# Patient Record
Sex: Male | Born: 1960 | Smoking: Never smoker
Health system: Southern US, Community
[De-identification: ages and names within clinical notes are randomized; demographics above are authoritative.]

## PROBLEM LIST (undated history)

## (undated) DIAGNOSIS — I4891 Unspecified atrial fibrillation: Secondary | ICD-10-CM

## (undated) DIAGNOSIS — E119 Type 2 diabetes mellitus without complications: Secondary | ICD-10-CM

## (undated) DIAGNOSIS — I152 Hypertension secondary to endocrine disorders: Secondary | ICD-10-CM

## (undated) DIAGNOSIS — M109 Gout, unspecified: Secondary | ICD-10-CM

## (undated) DIAGNOSIS — G4733 Obstructive sleep apnea (adult) (pediatric): Secondary | ICD-10-CM

## (undated) DIAGNOSIS — I509 Heart failure, unspecified: Secondary | ICD-10-CM

## (undated) DIAGNOSIS — E1159 Type 2 diabetes mellitus with other circulatory complications: Secondary | ICD-10-CM

## (undated) HISTORY — PX: MITRAL VALVE REPAIR: SHX2039

---

## 2004-02-09 ENCOUNTER — Other Ambulatory Visit: Payer: Self-pay

## 2004-02-09 ENCOUNTER — Inpatient Hospital Stay: Payer: Self-pay

## 2004-03-02 ENCOUNTER — Ambulatory Visit: Payer: Self-pay

## 2004-09-13 ENCOUNTER — Ambulatory Visit: Payer: Self-pay | Admitting: Internal Medicine

## 2005-06-23 ENCOUNTER — Ambulatory Visit: Payer: Self-pay | Admitting: Internal Medicine

## 2005-06-30 ENCOUNTER — Ambulatory Visit: Payer: Self-pay | Admitting: Internal Medicine

## 2005-08-23 ENCOUNTER — Other Ambulatory Visit: Payer: Self-pay

## 2005-08-23 ENCOUNTER — Inpatient Hospital Stay: Payer: Self-pay | Admitting: Internal Medicine

## 2005-08-24 ENCOUNTER — Other Ambulatory Visit: Payer: Self-pay

## 2010-04-01 ENCOUNTER — Emergency Department: Payer: Self-pay | Admitting: Internal Medicine

## 2010-06-08 ENCOUNTER — Inpatient Hospital Stay: Payer: Self-pay | Admitting: Internal Medicine

## 2010-06-22 ENCOUNTER — Ambulatory Visit: Payer: Self-pay | Admitting: Cardiovascular Disease

## 2010-11-04 ENCOUNTER — Inpatient Hospital Stay: Payer: Self-pay | Admitting: Internal Medicine

## 2018-11-16 ENCOUNTER — Other Ambulatory Visit: Payer: Self-pay

## 2018-11-16 ENCOUNTER — Inpatient Hospital Stay: Payer: Medicare Other

## 2018-11-16 ENCOUNTER — Inpatient Hospital Stay
Admission: EM | Admit: 2018-11-16 | Discharge: 2018-12-20 | DRG: 004 | Disposition: E | Payer: Medicare Other | Attending: Pulmonary Disease | Admitting: Pulmonary Disease

## 2018-11-16 ENCOUNTER — Encounter: Payer: Self-pay | Admitting: Emergency Medicine

## 2018-11-16 ENCOUNTER — Emergency Department: Payer: Medicare Other

## 2018-11-16 DIAGNOSIS — E668 Other obesity: Secondary | ICD-10-CM | POA: Diagnosis not present

## 2018-11-16 DIAGNOSIS — Z01818 Encounter for other preprocedural examination: Secondary | ICD-10-CM

## 2018-11-16 DIAGNOSIS — M109 Gout, unspecified: Secondary | ICD-10-CM | POA: Diagnosis present

## 2018-11-16 DIAGNOSIS — J80 Acute respiratory distress syndrome: Secondary | ICD-10-CM | POA: Diagnosis not present

## 2018-11-16 DIAGNOSIS — E1122 Type 2 diabetes mellitus with diabetic chronic kidney disease: Secondary | ICD-10-CM | POA: Diagnosis present

## 2018-11-16 DIAGNOSIS — N179 Acute kidney failure, unspecified: Secondary | ICD-10-CM | POA: Diagnosis present

## 2018-11-16 DIAGNOSIS — E43 Unspecified severe protein-calorie malnutrition: Secondary | ICD-10-CM | POA: Diagnosis not present

## 2018-11-16 DIAGNOSIS — N183 Chronic kidney disease, stage 3 (moderate): Secondary | ICD-10-CM | POA: Diagnosis present

## 2018-11-16 DIAGNOSIS — Z79899 Other long term (current) drug therapy: Secondary | ICD-10-CM

## 2018-11-16 DIAGNOSIS — J9601 Acute respiratory failure with hypoxia: Secondary | ICD-10-CM | POA: Diagnosis not present

## 2018-11-16 DIAGNOSIS — I130 Hypertensive heart and chronic kidney disease with heart failure and stage 1 through stage 4 chronic kidney disease, or unspecified chronic kidney disease: Secondary | ICD-10-CM | POA: Diagnosis present

## 2018-11-16 DIAGNOSIS — G92 Toxic encephalopathy: Secondary | ICD-10-CM | POA: Diagnosis not present

## 2018-11-16 DIAGNOSIS — I4819 Other persistent atrial fibrillation: Secondary | ICD-10-CM | POA: Diagnosis present

## 2018-11-16 DIAGNOSIS — I509 Heart failure, unspecified: Secondary | ICD-10-CM | POA: Diagnosis not present

## 2018-11-16 DIAGNOSIS — R0602 Shortness of breath: Secondary | ICD-10-CM | POA: Diagnosis present

## 2018-11-16 DIAGNOSIS — E662 Morbid (severe) obesity with alveolar hypoventilation: Secondary | ICD-10-CM | POA: Diagnosis present

## 2018-11-16 DIAGNOSIS — J440 Chronic obstructive pulmonary disease with acute lower respiratory infection: Secondary | ICD-10-CM | POA: Diagnosis present

## 2018-11-16 DIAGNOSIS — R0902 Hypoxemia: Secondary | ICD-10-CM | POA: Diagnosis not present

## 2018-11-16 DIAGNOSIS — Z7189 Other specified counseling: Secondary | ICD-10-CM | POA: Diagnosis not present

## 2018-11-16 DIAGNOSIS — R6521 Severe sepsis with septic shock: Secondary | ICD-10-CM | POA: Diagnosis not present

## 2018-11-16 DIAGNOSIS — J9602 Acute respiratory failure with hypercapnia: Secondary | ICD-10-CM

## 2018-11-16 DIAGNOSIS — H5500 Unspecified nystagmus: Secondary | ICD-10-CM | POA: Diagnosis not present

## 2018-11-16 DIAGNOSIS — E46 Unspecified protein-calorie malnutrition: Secondary | ICD-10-CM | POA: Diagnosis not present

## 2018-11-16 DIAGNOSIS — K8020 Calculus of gallbladder without cholecystitis without obstruction: Secondary | ICD-10-CM | POA: Diagnosis present

## 2018-11-16 DIAGNOSIS — J810 Acute pulmonary edema: Secondary | ICD-10-CM | POA: Diagnosis not present

## 2018-11-16 DIAGNOSIS — E870 Hyperosmolality and hypernatremia: Secondary | ICD-10-CM | POA: Diagnosis not present

## 2018-11-16 DIAGNOSIS — Z9911 Dependence on respirator [ventilator] status: Secondary | ICD-10-CM

## 2018-11-16 DIAGNOSIS — Z515 Encounter for palliative care: Secondary | ICD-10-CM | POA: Diagnosis not present

## 2018-11-16 DIAGNOSIS — T17998A Other foreign object in respiratory tract, part unspecified causing other injury, initial encounter: Secondary | ICD-10-CM | POA: Diagnosis not present

## 2018-11-16 DIAGNOSIS — I5043 Acute on chronic combined systolic (congestive) and diastolic (congestive) heart failure: Secondary | ICD-10-CM | POA: Diagnosis present

## 2018-11-16 DIAGNOSIS — N170 Acute kidney failure with tubular necrosis: Secondary | ICD-10-CM | POA: Diagnosis not present

## 2018-11-16 DIAGNOSIS — I5023 Acute on chronic systolic (congestive) heart failure: Secondary | ICD-10-CM | POA: Diagnosis not present

## 2018-11-16 DIAGNOSIS — Z952 Presence of prosthetic heart valve: Secondary | ICD-10-CM

## 2018-11-16 DIAGNOSIS — J9819 Other pulmonary collapse: Secondary | ICD-10-CM | POA: Diagnosis not present

## 2018-11-16 DIAGNOSIS — Z7901 Long term (current) use of anticoagulants: Secondary | ICD-10-CM

## 2018-11-16 DIAGNOSIS — E1165 Type 2 diabetes mellitus with hyperglycemia: Secondary | ICD-10-CM | POA: Diagnosis not present

## 2018-11-16 DIAGNOSIS — J1520 Pneumonia due to staphylococcus, unspecified: Secondary | ICD-10-CM | POA: Diagnosis present

## 2018-11-16 DIAGNOSIS — J441 Chronic obstructive pulmonary disease with (acute) exacerbation: Secondary | ICD-10-CM

## 2018-11-16 DIAGNOSIS — K760 Fatty (change of) liver, not elsewhere classified: Secondary | ICD-10-CM | POA: Diagnosis present

## 2018-11-16 DIAGNOSIS — J9620 Acute and chronic respiratory failure, unspecified whether with hypoxia or hypercapnia: Secondary | ICD-10-CM | POA: Diagnosis not present

## 2018-11-16 DIAGNOSIS — Z66 Do not resuscitate: Secondary | ICD-10-CM | POA: Diagnosis not present

## 2018-11-16 DIAGNOSIS — Z20828 Contact with and (suspected) exposure to other viral communicable diseases: Secondary | ICD-10-CM | POA: Diagnosis present

## 2018-11-16 DIAGNOSIS — J9600 Acute respiratory failure, unspecified whether with hypoxia or hypercapnia: Secondary | ICD-10-CM

## 2018-11-16 DIAGNOSIS — J9622 Acute and chronic respiratory failure with hypercapnia: Secondary | ICD-10-CM | POA: Diagnosis present

## 2018-11-16 DIAGNOSIS — H7090 Unspecified mastoiditis, unspecified ear: Secondary | ICD-10-CM | POA: Diagnosis not present

## 2018-11-16 DIAGNOSIS — M6284 Sarcopenia: Secondary | ICD-10-CM | POA: Diagnosis present

## 2018-11-16 DIAGNOSIS — E872 Acidosis: Secondary | ICD-10-CM | POA: Diagnosis not present

## 2018-11-16 DIAGNOSIS — Z6841 Body Mass Index (BMI) 40.0 and over, adult: Secondary | ICD-10-CM | POA: Diagnosis not present

## 2018-11-16 DIAGNOSIS — D631 Anemia in chronic kidney disease: Secondary | ICD-10-CM | POA: Diagnosis present

## 2018-11-16 DIAGNOSIS — E782 Mixed hyperlipidemia: Secondary | ICD-10-CM | POA: Diagnosis present

## 2018-11-16 DIAGNOSIS — Z978 Presence of other specified devices: Secondary | ICD-10-CM

## 2018-11-16 DIAGNOSIS — J9621 Acute and chronic respiratory failure with hypoxia: Secondary | ICD-10-CM | POA: Diagnosis present

## 2018-11-16 DIAGNOSIS — R509 Fever, unspecified: Secondary | ICD-10-CM

## 2018-11-16 DIAGNOSIS — Z9981 Dependence on supplemental oxygen: Secondary | ICD-10-CM

## 2018-11-16 DIAGNOSIS — Z7982 Long term (current) use of aspirin: Secondary | ICD-10-CM

## 2018-11-16 DIAGNOSIS — Z452 Encounter for adjustment and management of vascular access device: Secondary | ICD-10-CM

## 2018-11-16 DIAGNOSIS — R140 Abdominal distension (gaseous): Secondary | ICD-10-CM

## 2018-11-16 DIAGNOSIS — J9811 Atelectasis: Secondary | ICD-10-CM | POA: Diagnosis not present

## 2018-11-16 DIAGNOSIS — J9690 Respiratory failure, unspecified, unspecified whether with hypoxia or hypercapnia: Secondary | ICD-10-CM

## 2018-11-16 DIAGNOSIS — R579 Shock, unspecified: Secondary | ICD-10-CM | POA: Diagnosis not present

## 2018-11-16 DIAGNOSIS — Z4659 Encounter for fitting and adjustment of other gastrointestinal appliance and device: Secondary | ICD-10-CM

## 2018-11-16 DIAGNOSIS — R627 Adult failure to thrive: Secondary | ICD-10-CM | POA: Diagnosis not present

## 2018-11-16 DIAGNOSIS — Z0189 Encounter for other specified special examinations: Secondary | ICD-10-CM

## 2018-11-16 DIAGNOSIS — A419 Sepsis, unspecified organism: Secondary | ICD-10-CM

## 2018-11-16 DIAGNOSIS — D696 Thrombocytopenia, unspecified: Secondary | ICD-10-CM | POA: Diagnosis not present

## 2018-11-16 DIAGNOSIS — T17800A Unspecified foreign body in other parts of respiratory tract causing asphyxiation, initial encounter: Secondary | ICD-10-CM

## 2018-11-16 DIAGNOSIS — E876 Hypokalemia: Secondary | ICD-10-CM | POA: Diagnosis not present

## 2018-11-16 DIAGNOSIS — J9809 Other diseases of bronchus, not elsewhere classified: Secondary | ICD-10-CM | POA: Diagnosis not present

## 2018-11-16 DIAGNOSIS — Z7984 Long term (current) use of oral hypoglycemic drugs: Secondary | ICD-10-CM

## 2018-11-16 DIAGNOSIS — I482 Chronic atrial fibrillation, unspecified: Secondary | ICD-10-CM | POA: Diagnosis not present

## 2018-11-16 DIAGNOSIS — Z992 Dependence on renal dialysis: Secondary | ICD-10-CM

## 2018-11-16 DIAGNOSIS — I5033 Acute on chronic diastolic (congestive) heart failure: Secondary | ICD-10-CM | POA: Diagnosis not present

## 2018-11-16 DIAGNOSIS — J159 Unspecified bacterial pneumonia: Secondary | ICD-10-CM | POA: Diagnosis not present

## 2018-11-16 HISTORY — DX: Hypertension secondary to endocrine disorders: I15.2

## 2018-11-16 HISTORY — DX: Heart failure, unspecified: I50.9

## 2018-11-16 HISTORY — DX: Morbid (severe) obesity due to excess calories: E66.01

## 2018-11-16 HISTORY — DX: Type 2 diabetes mellitus with other circulatory complications: E11.59

## 2018-11-16 HISTORY — DX: Type 2 diabetes mellitus without complications: E11.9

## 2018-11-16 HISTORY — DX: Unspecified atrial fibrillation: I48.91

## 2018-11-16 HISTORY — DX: Gout, unspecified: M10.9

## 2018-11-16 HISTORY — DX: Obstructive sleep apnea (adult) (pediatric): G47.33

## 2018-11-16 LAB — CBC WITH DIFFERENTIAL/PLATELET
Abs Immature Granulocytes: 0.05 10*3/uL (ref 0.00–0.07)
Basophils Absolute: 0 10*3/uL (ref 0.0–0.1)
Basophils Relative: 0 %
Eosinophils Absolute: 0 10*3/uL (ref 0.0–0.5)
Eosinophils Relative: 0 %
HCT: 44.2 % (ref 39.0–52.0)
Hemoglobin: 13.6 g/dL (ref 13.0–17.0)
Immature Granulocytes: 1 %
Lymphocytes Relative: 11 %
Lymphs Abs: 0.8 10*3/uL (ref 0.7–4.0)
MCH: 30.2 pg (ref 26.0–34.0)
MCHC: 30.8 g/dL (ref 30.0–36.0)
MCV: 98 fL (ref 80.0–100.0)
Monocytes Absolute: 1 10*3/uL (ref 0.1–1.0)
Monocytes Relative: 13 %
Neutro Abs: 5.7 10*3/uL (ref 1.7–7.7)
Neutrophils Relative %: 75 %
Platelets: 192 10*3/uL (ref 150–400)
RBC: 4.51 MIL/uL (ref 4.22–5.81)
RDW: 13.2 % (ref 11.5–15.5)
WBC: 7.5 10*3/uL (ref 4.0–10.5)
nRBC: 0 % (ref 0.0–0.2)

## 2018-11-16 LAB — COMPREHENSIVE METABOLIC PANEL
ALT: 29 U/L (ref 0–44)
AST: 23 U/L (ref 15–41)
Albumin: 3.6 g/dL (ref 3.5–5.0)
Alkaline Phosphatase: 67 U/L (ref 38–126)
Anion gap: 9 (ref 5–15)
BUN: 34 mg/dL — ABNORMAL HIGH (ref 6–20)
CO2: 34 mmol/L — ABNORMAL HIGH (ref 22–32)
Calcium: 8.8 mg/dL — ABNORMAL LOW (ref 8.9–10.3)
Chloride: 92 mmol/L — ABNORMAL LOW (ref 98–111)
Creatinine, Ser: 1.31 mg/dL — ABNORMAL HIGH (ref 0.61–1.24)
GFR calc Af Amer: >60 mL/min (ref >60)
GFR calc non Af Amer: 59 mL/min — ABNORMAL LOW (ref >60)
Glucose, Bld: 201 mg/dL — ABNORMAL HIGH (ref 70–99)
Potassium: 4.8 mmol/L (ref 3.5–5.1)
Sodium: 135 mmol/L (ref 135–145)
Total Bilirubin: 0.5 mg/dL (ref 0.3–1.2)
Total Protein: 6.9 g/dL (ref 6.5–8.1)

## 2018-11-16 LAB — SARS CORONAVIRUS 2 BY RT PCR (HOSPITAL ORDER, PERFORMED IN ~~LOC~~ HOSPITAL LAB): SARS Coronavirus 2: NEGATIVE

## 2018-11-16 LAB — BRAIN NATRIURETIC PEPTIDE: B Natriuretic Peptide: 389 pg/mL — ABNORMAL HIGH (ref 0.0–100.0)

## 2018-11-16 LAB — BLOOD GAS, ARTERIAL
Acid-Base Excess: 10.5 mmol/L — ABNORMAL HIGH (ref 0.0–2.0)
Bicarbonate: 36.7 mmol/L — ABNORMAL HIGH (ref 20.0–28.0)
FIO2: 1
MECHVT: 500 mL
Mechanical Rate: 25
O2 Saturation: 99.8 %
PEEP: 8 cmH2O
Patient temperature: 37
RATE: 25 resp/min
pCO2 arterial: 54 mmHg — ABNORMAL HIGH (ref 32.0–48.0)
pH, Arterial: 7.44 (ref 7.350–7.450)
pO2, Arterial: 211 mmHg — ABNORMAL HIGH (ref 83.0–108.0)

## 2018-11-16 LAB — TYPE AND SCREEN
ABO/RH(D): O POS
Antibody Screen: NEGATIVE

## 2018-11-16 LAB — ETHANOL: Alcohol, Ethyl (B): <10 mg/dL (ref <10)

## 2018-11-16 LAB — GLUCOSE, CAPILLARY: Glucose-Capillary: 138 mg/dL — ABNORMAL HIGH (ref 70–99)

## 2018-11-16 LAB — PROTIME-INR
INR: 3 — ABNORMAL HIGH (ref 0.8–1.2)
Prothrombin Time: 30.9 seconds — ABNORMAL HIGH (ref 11.4–15.2)

## 2018-11-16 LAB — TROPONIN I (HIGH SENSITIVITY): Troponin I (High Sensitivity): 14 ng/L (ref <18)

## 2018-11-16 MED ORDER — METOPROLOL TARTRATE 50 MG PO TABS: 50.0000 mg | ORAL_TABLET | Freq: Two times a day (BID) | ORAL | Status: DC

## 2018-11-16 MED ORDER — FENTANYL 2500MCG IN NS 250ML (10MCG/ML) PREMIX INFUSION
0.0000 ug/h | INTRAVENOUS | Status: DC
  Administered 2018-11-16: 100 ug/h via INTRAVENOUS
  Administered 2018-11-17 – 2018-11-18 (×4): 400 ug/h via INTRAVENOUS
  Filled 2018-11-16 (×6): qty 250

## 2018-11-16 MED ORDER — WARFARIN SODIUM 4 MG PO TABS
4.0000 mg | ORAL_TABLET | ORAL | Status: DC
  Administered 2018-11-17: 4 mg via NASOGASTRIC
  Filled 2018-11-16 (×2): qty 1

## 2018-11-16 MED ORDER — PANTOPRAZOLE SODIUM 40 MG IV SOLR
40.0000 mg | Freq: Every day | INTRAVENOUS | Status: DC
  Administered 2018-11-17 – 2018-11-18 (×3): 40 mg via INTRAVENOUS
  Filled 2018-11-16 (×3): qty 40

## 2018-11-16 MED ORDER — PRAVASTATIN SODIUM 40 MG PO TABS
80.0000 mg | ORAL_TABLET | Freq: Every evening | ORAL | Status: DC
  Filled 2018-11-16: qty 2

## 2018-11-16 MED ORDER — MIDAZOLAM HCL 2 MG/2ML IJ SOLN
2.0000 mg | INTRAMUSCULAR | Status: DC | PRN
  Administered 2018-11-17: 2 mg via INTRAVENOUS
  Administered 2018-11-17: 4 mg via INTRAVENOUS
  Administered 2018-11-17: 2 mg via INTRAVENOUS
  Administered 2018-11-18 – 2018-11-19 (×3): 4 mg via INTRAVENOUS
  Administered 2018-11-20: 10:00:00 2 mg via INTRAVENOUS
  Administered 2018-11-20: 04:00:00 4 mg via INTRAVENOUS
  Administered 2018-11-20 – 2018-11-29 (×6): 2 mg via INTRAVENOUS
  Filled 2018-11-16 (×2): qty 4
  Filled 2018-11-16: qty 2
  Filled 2018-11-16: qty 4
  Filled 2018-11-16: qty 2
  Filled 2018-11-16: qty 4
  Filled 2018-11-16: qty 2
  Filled 2018-11-16: qty 4
  Filled 2018-11-16 (×2): qty 2
  Filled 2018-11-16: qty 4
  Filled 2018-11-16: qty 2
  Filled 2018-11-16 (×2): qty 4
  Filled 2018-11-16: qty 2

## 2018-11-16 MED ORDER — ORAL CARE MOUTH RINSE
15.0000 mL | OROMUCOSAL | Status: DC
  Administered 2018-11-17 – 2018-11-18 (×11): 15 mL via OROMUCOSAL

## 2018-11-16 MED ORDER — ETOMIDATE 2 MG/ML IV SOLN
INTRAVENOUS | Status: AC
  Filled 2018-11-16: qty 20

## 2018-11-16 MED ORDER — MIDAZOLAM HCL 2 MG/2ML IJ SOLN
4.0000 mg | Freq: Once | INTRAMUSCULAR | Status: DC
  Filled 2018-11-16: qty 4

## 2018-11-16 MED ORDER — DILTIAZEM HCL 30 MG PO TABS
60.0000 mg | ORAL_TABLET | Freq: Four times a day (QID) | ORAL | Status: DC
  Administered 2018-11-17 – 2018-11-18 (×4): 60 mg via NASOGASTRIC
  Filled 2018-11-16 (×7): qty 2

## 2018-11-16 MED ORDER — INSULIN ASPART 100 UNIT/ML ~~LOC~~ SOLN
0.0000 [IU] | SUBCUTANEOUS | Status: DC
  Administered 2018-11-17 (×4): 5 [IU] via SUBCUTANEOUS
  Administered 2018-11-17: 3 [IU] via SUBCUTANEOUS
  Administered 2018-11-17: 8 [IU] via SUBCUTANEOUS
  Administered 2018-11-18: 5 [IU] via SUBCUTANEOUS
  Administered 2018-11-18 (×2): 3 [IU] via SUBCUTANEOUS
  Administered 2018-11-18: 2 [IU] via SUBCUTANEOUS
  Administered 2018-11-18 (×2): 3 [IU] via SUBCUTANEOUS
  Administered 2018-11-19: 5 [IU] via SUBCUTANEOUS
  Administered 2018-11-19 (×3): 3 [IU] via SUBCUTANEOUS
  Administered 2018-11-19 (×2): 5 [IU] via SUBCUTANEOUS
  Administered 2018-11-19 – 2018-11-20 (×4): 3 [IU] via SUBCUTANEOUS
  Administered 2018-11-20: 5 [IU] via SUBCUTANEOUS
  Administered 2018-11-20: 3 [IU] via SUBCUTANEOUS
  Administered 2018-11-21: 05:00:00 5 [IU] via SUBCUTANEOUS
  Administered 2018-11-21: 3 [IU] via SUBCUTANEOUS
  Administered 2018-11-21: 17:00:00 5 [IU] via SUBCUTANEOUS
  Administered 2018-11-21: 3 [IU] via SUBCUTANEOUS
  Administered 2018-11-21 – 2018-11-22 (×3): 5 [IU] via SUBCUTANEOUS
  Administered 2018-11-22 (×2): 8 [IU] via SUBCUTANEOUS
  Administered 2018-11-22 (×2): 5 [IU] via SUBCUTANEOUS
  Administered 2018-11-22: 05:00:00 8 [IU] via SUBCUTANEOUS
  Administered 2018-11-23: 5 [IU] via SUBCUTANEOUS
  Administered 2018-11-23: 11 [IU] via SUBCUTANEOUS
  Administered 2018-11-23 (×3): 8 [IU] via SUBCUTANEOUS
  Administered 2018-11-23 – 2018-11-24 (×2): 5 [IU] via SUBCUTANEOUS
  Administered 2018-11-24: 11 [IU] via SUBCUTANEOUS
  Administered 2018-11-24 (×3): 8 [IU] via SUBCUTANEOUS
  Administered 2018-11-24: 5 [IU] via SUBCUTANEOUS
  Administered 2018-11-24: 8 [IU] via SUBCUTANEOUS
  Administered 2018-11-25 (×2): 11 [IU] via SUBCUTANEOUS
  Administered 2018-11-25: 15 [IU] via SUBCUTANEOUS
  Administered 2018-11-25: 11 [IU] via SUBCUTANEOUS
  Administered 2018-11-25: 15 [IU] via SUBCUTANEOUS
  Administered 2018-11-25: 08:00:00 11 [IU] via SUBCUTANEOUS
  Administered 2018-11-26 (×2): 15 [IU] via SUBCUTANEOUS
  Administered 2018-11-26: 11 [IU] via SUBCUTANEOUS
  Administered 2018-11-26 – 2018-11-27 (×4): 15 [IU] via SUBCUTANEOUS
  Filled 2018-11-16 (×61): qty 1

## 2018-11-16 MED ORDER — SODIUM CHLORIDE 0.9% FLUSH
3.0000 mL | INTRAVENOUS | Status: DC | PRN
  Administered 2018-11-17 (×2): 3 mL via INTRAVENOUS
  Filled 2018-11-16 (×2): qty 3

## 2018-11-16 MED ORDER — BISACODYL 10 MG RE SUPP: 10.0000 mg | Freq: Every day | RECTAL | Status: DC | PRN

## 2018-11-16 MED ORDER — WARFARIN SODIUM 2.5 MG PO TABS: 2.5000 mg | ORAL_TABLET | ORAL | Status: DC

## 2018-11-16 MED ORDER — FENTANYL CITRATE (PF) 100 MCG/2ML IJ SOLN: 50.0000 ug | Freq: Once | INTRAMUSCULAR | Status: DC

## 2018-11-16 MED ORDER — WARFARIN - PHYSICIAN DOSING INPATIENT: Freq: Every day | Status: DC

## 2018-11-16 MED ORDER — ETOMIDATE 2 MG/ML IV SOLN: 40.0000 mg | Freq: Once | INTRAVENOUS | Status: DC

## 2018-11-16 MED ORDER — ONDANSETRON HCL 4 MG/2ML IJ SOLN: 4.0000 mg | Freq: Four times a day (QID) | INTRAMUSCULAR | Status: DC | PRN

## 2018-11-16 MED ORDER — IPRATROPIUM-ALBUTEROL 0.5-2.5 (3) MG/3ML IN SOLN
3.0000 mL | RESPIRATORY_TRACT | Status: DC
  Administered 2018-11-16 – 2018-11-24 (×43): 3 mL via RESPIRATORY_TRACT
  Filled 2018-11-16 (×43): qty 3

## 2018-11-16 MED ORDER — COLCHICINE 0.6 MG PO TABS
1.2000 mg | ORAL_TABLET | Freq: Every day | ORAL | Status: DC | PRN
  Filled 2018-11-16: qty 2

## 2018-11-16 MED ORDER — FENTANYL BOLUS VIA INFUSION
50.0000 ug | INTRAVENOUS | Status: DC | PRN
  Filled 2018-11-16: qty 50

## 2018-11-16 MED ORDER — DEXMEDETOMIDINE HCL IN NACL 400 MCG/100ML IV SOLN
0.0000 ug/kg/h | INTRAVENOUS | Status: DC
  Administered 2018-11-16: 1 ug/kg/h via INTRAVENOUS

## 2018-11-16 MED ORDER — ACETAMINOPHEN 325 MG PO TABS
650.0000 mg | ORAL_TABLET | ORAL | Status: DC | PRN
  Administered 2018-11-23 – 2018-12-12 (×9): 650 mg via NASOGASTRIC
  Filled 2018-11-16 (×9): qty 2

## 2018-11-16 MED ORDER — WARFARIN SODIUM 4 MG PO TABS: 4.0000 mg | ORAL_TABLET | ORAL | Status: DC

## 2018-11-16 MED ORDER — FUROSEMIDE 10 MG/ML IJ SOLN
40.0000 mg | Freq: Once | INTRAMUSCULAR | Status: AC
  Administered 2018-11-16: 40 mg via INTRAVENOUS
  Filled 2018-11-16: qty 4

## 2018-11-16 MED ORDER — MIDAZOLAM HCL 2 MG/2ML IJ SOLN
2.0000 mg | INTRAMUSCULAR | Status: AC | PRN
  Administered 2018-11-17: 4 mg via INTRAVENOUS
  Administered 2018-11-17: 1 mg via INTRAVENOUS
  Administered 2018-11-17: 2 mg via INTRAVENOUS
  Filled 2018-11-16 (×2): qty 2
  Filled 2018-11-16 (×2): qty 4

## 2018-11-16 MED ORDER — PRAVASTATIN SODIUM 20 MG PO TABS
80.0000 mg | ORAL_TABLET | Freq: Every evening | ORAL | Status: DC
  Administered 2018-11-17 – 2018-11-29 (×13): 80 mg via NASOGASTRIC
  Filled 2018-11-16 (×2): qty 2
  Filled 2018-11-16 (×7): qty 4
  Filled 2018-11-16: qty 2
  Filled 2018-11-16 (×3): qty 4
  Filled 2018-11-16 (×2): qty 2
  Filled 2018-11-16 (×2): qty 4
  Filled 2018-11-16: qty 2

## 2018-11-16 MED ORDER — CHLORHEXIDINE GLUCONATE 0.12% ORAL RINSE (MEDLINE KIT)
15.0000 mL | Freq: Two times a day (BID) | OROMUCOSAL | Status: DC
  Administered 2018-11-17 – 2018-11-18 (×3): 15 mL via OROMUCOSAL

## 2018-11-16 MED ORDER — CHLORHEXIDINE GLUCONATE CLOTH 2 % EX PADS: 6.0000 | MEDICATED_PAD | Freq: Every day | CUTANEOUS | Status: DC

## 2018-11-16 MED ORDER — WARFARIN SODIUM 2.5 MG PO TABS
2.5000 mg | ORAL_TABLET | ORAL | Status: DC
  Filled 2018-11-16: qty 1

## 2018-11-16 MED ORDER — METHYLPREDNISOLONE SODIUM SUCC 125 MG IJ SOLR
60.0000 mg | Freq: Two times a day (BID) | INTRAMUSCULAR | Status: DC
  Administered 2018-11-17 – 2018-11-18 (×4): 60 mg via INTRAVENOUS
  Filled 2018-11-16 (×4): qty 2

## 2018-11-16 MED ORDER — DILTIAZEM HCL ER COATED BEADS 240 MG PO CP24
240.0000 mg | ORAL_CAPSULE | Freq: Every day | ORAL | Status: DC
  Filled 2018-11-16: qty 1

## 2018-11-16 MED ORDER — LISINOPRIL 5 MG PO TABS
5.0000 mg | ORAL_TABLET | Freq: Every day | ORAL | Status: DC
  Administered 2018-11-17 – 2018-11-22 (×4): 5 mg via NASOGASTRIC
  Filled 2018-11-16 (×5): qty 1

## 2018-11-16 MED ORDER — FENTANYL CITRATE (PF) 100 MCG/2ML IJ SOLN
100.0000 ug | Freq: Once | INTRAMUSCULAR | Status: AC
  Administered 2018-11-16: 100 ug via INTRAVENOUS
  Filled 2018-11-16: qty 2

## 2018-11-16 MED ORDER — SODIUM CHLORIDE 0.9% FLUSH
3.0000 mL | Freq: Two times a day (BID) | INTRAVENOUS | Status: DC
  Administered 2018-11-17: 3 mL via INTRAVENOUS

## 2018-11-16 MED ORDER — ASPIRIN 81 MG PO CHEW
81.0000 mg | CHEWABLE_TABLET | Freq: Every day | ORAL | Status: DC
  Administered 2018-11-17 – 2018-12-08 (×20): 81 mg via NASOGASTRIC
  Filled 2018-11-16 (×21): qty 1

## 2018-11-16 MED ORDER — ACETAMINOPHEN 325 MG PO TABS: 650.0000 mg | ORAL_TABLET | ORAL | Status: DC | PRN

## 2018-11-16 MED ORDER — SENNOSIDES 8.8 MG/5ML PO SYRP
5.0000 mL | ORAL_SOLUTION | Freq: Two times a day (BID) | ORAL | Status: DC | PRN
  Administered 2018-11-21: 5 mL
  Filled 2018-11-16 (×2): qty 5

## 2018-11-16 MED ORDER — SODIUM CHLORIDE 0.9 % IV SOLN: 250.0000 mL | INTRAVENOUS | Status: DC | PRN

## 2018-11-16 MED ORDER — ASPIRIN EC 81 MG PO TBEC: 81.0000 mg | DELAYED_RELEASE_TABLET | Freq: Every day | ORAL | Status: DC

## 2018-11-16 MED ORDER — ROCURONIUM BROMIDE 50 MG/5ML IV SOLN
100.0000 mg | Freq: Once | INTRAVENOUS | Status: AC
  Administered 2018-11-16: 100 mg via INTRAVENOUS
  Filled 2018-11-16: qty 2

## 2018-11-16 MED ORDER — CHLORHEXIDINE GLUCONATE CLOTH 2 % EX PADS
6.0000 | MEDICATED_PAD | Freq: Every day | CUTANEOUS | Status: DC
  Administered 2018-11-17 – 2018-11-22 (×6): 6 via TOPICAL

## 2018-11-16 MED ORDER — METOPROLOL TARTRATE 50 MG PO TABS
50.0000 mg | ORAL_TABLET | Freq: Two times a day (BID) | ORAL | Status: DC
  Administered 2018-11-17 – 2018-11-30 (×21): 50 mg via NASOGASTRIC
  Filled 2018-11-16 (×26): qty 1

## 2018-11-16 MED ORDER — FENTANYL CITRATE (PF) 100 MCG/2ML IJ SOLN
200.0000 ug | Freq: Once | INTRAMUSCULAR | Status: DC
  Filled 2018-11-16: qty 4

## 2018-11-16 MED ORDER — FUROSEMIDE 10 MG/ML IJ SOLN
40.0000 mg | Freq: Two times a day (BID) | INTRAMUSCULAR | Status: DC
  Administered 2018-11-17: 40 mg via INTRAVENOUS
  Filled 2018-11-16: qty 4

## 2018-11-16 MED ORDER — NOREPINEPHRINE BITARTRATE 1 MG/ML IV SOLN
0.0000 ug/min | INTRAVENOUS | Status: DC
  Administered 2018-11-17: 10 ug/min via INTRAVENOUS
  Filled 2018-11-16: qty 4

## 2018-11-16 MED ORDER — SODIUM CHLORIDE 0.9% FLUSH: 10.0000 mL | INTRAVENOUS | Status: DC | PRN

## 2018-11-16 MED ORDER — LISINOPRIL 5 MG PO TABS: 5.0000 mg | ORAL_TABLET | Freq: Every day | ORAL | Status: DC

## 2018-11-16 NOTE — H&P (Signed)
[facility] [place] NAME: Kyle White    MR#:  [id number]  DATE OF BIRTH:  1960-05-12  DATE OF ADMISSION:  11/09/2018  PRIMARY CARE PHYSICIAN: [facility] [place]   REQUESTING/REFERRING PHYSICIAN: Charlotte Crumb, MD  CHIEF COMPLAINT:   Chief Complaint  Patient presents with  . Shortness of Breath  . Weakness    HISTORY OF PRESENT ILLNESS:  Kyle White  is a 58 y.o. male with a known history of atrial fibrillation on Coumadin, CHF, diabetes mellitus, and obstructive sleep apnea with CPAP use at home, morbid obesity with sedentary lifestyle on oxygen at 2 L/min at home.  Patient was placed on BiPAP therapy shortly after arriving from home via EMS services after being called by the patient's longtime girlfriend who is currently at the bedside.  She tells me he has been more short of breath for about 2 weeks with increased weakness and fatigue.  He has been using home oxygen therapy as well as CPAP at night and during hours of sleep during the day.  He has had an occasional nonproductive cough.  He denies chest pain.  He denies fever, chills, nausea, vomiting, or diarrhea.  Patient nor his girlfriend have noticed any increased edema of his lower extremities.  Also no noted increased abdominal girth.  Echocardiogram in 2014 demonstrated 55 to 60% ejection fraction at [facility].  VBG on arrival shows pH of 7.2 with HCO3 of 50.  CO2 is a 95.  EKG was completed with no evidence of ischemia.  According to patient's documented history, he has a history of mitral valve repair.  BNP on arrival is 389.  Troponin is 14.  BUN is 34 with creatinine 1.31.  Chest x-ray demonstrated pulmonary vascular congestion.  He has been admitted to the hospitalist service with transfer of care to the intensivist and ICU/stepdown.  PAST MEDICAL HISTORY:   Past Medical History:  Diagnosis Date  . Atrial fibrillation ([place])   . CHF (congestive heart failure) ([place])    . Diabetes mellitus without complication ([place])   . Gout   . Hypertension associated with type 2 diabetes mellitus ([place])   . Morbid obesity ([place])   . Obstructive sleep apnea     PAST SURGICAL HISTORY:   Past Surgical History:  Procedure Laterality Date  . MITRAL VALVE REPAIR      SOCIAL HISTORY:   Social History   Tobacco Use  . Smoking status: Never Smoker  . Smokeless tobacco: Never Used  Substance Use Topics  . Alcohol use: Never    Frequency: Never    FAMILY HISTORY:  History reviewed. No pertinent family history.  DRUG ALLERGIES:  No Known Allergies  REVIEW OF SYSTEMS:   ROS  Review of Systems  Constitutional: Positive for malaise/fatigue. Negative for chills and fever.  HENT: Negative for congestion and sore throat.   Eyes: Negative for blurred vision and double vision.  Respiratory: Positive for cough and shortness of breath. Negative for sputum production and wheezing.   Cardiovascular: Negative for chest pain, palpitations and leg swelling.  Gastrointestinal: Negative for abdominal pain, constipation, diarrhea, heartburn, nausea and vomiting.  Genitourinary: Negative for dysuria, flank pain and hematuria.  Musculoskeletal: Negative for falls and myalgias.  Skin: Negative for itching and rash.  Neurological: Positive for weakness. Negative for dizziness and headaches.  Psychiatric/Behavioral: Negative for depression.   MEDICATIONS AT HOME:   Prior to Admission medications   Medication Sig Start Date End Date  Taking? Authorizing Provider  aspirin EC 81 MG tablet Take 81 mg by mouth daily.   Yes [provider]  colchicine 0.6 MG tablet Take 12 mg by mouth. At first sing of gout flare   Yes [provider]  diltiazem (CARDIZEM CD) 240 MG 24 hr capsule Take 240 mg by mouth daily.   Yes [provider]  furosemide (LASIX) 40 MG tablet Take 40 mg by mouth 2 (two) times daily.   Yes [provider]  lisinopril (ZESTRIL)  5 MG tablet Take 5 mg by mouth daily.   Yes [provider]  metFORMIN (GLUCOPHAGE) 500 MG tablet Take 500 mg by mouth 2 (two) times daily with a meal.   Yes [provider]  metoprolol tartrate (LOPRESSOR) 100 MG tablet Take 50 mg by mouth 2 (two) times daily.   Yes [provider]  pravastatin (PRAVACHOL) 80 MG tablet Take 80 mg by mouth every evening.   Yes [provider]  warfarin (COUMADIN) 4 MG tablet Take 2.5-4 mg by mouth daily. Take 4 mg on Sunday, Tuesday, Wednesday, Thursday, and Saturday. Take 2.5 mg on Monday and Friday   Yes [provider]      VITAL SIGNS:  Blood pressure (!) 123/98, pulse 74, temperature 97.7 F (36.5 C), temperature source Oral, resp. rate (!) 25, height 5\' 9"  (1.753 m), weight (!) 158 kg, SpO2 97 %.  PHYSICAL EXAMINATION:  Physical Exam  GENERAL:  58 y.o.-year-old Ill-appearing patient lying in the bed with no acute distress.  EYES: Pupils equal, round, reactive to light and accommodation. No scleral icterus. Extraocular muscles intact.  HEENT: Head atraumatic, normocephalic. Oropharynx and nasopharynx clear.  NECK:  Supple, no jugular venous distention. No thyroid enlargement, no tenderness.  LUNGS:Bilateral rales. BIPAP in use CARDIOVASCULAR: Regular rate and rhythm, S1, S2 normal. No murmurs, rubs, or gallops.  ABDOMEN: morbid obesity.   Bowel sounds present. No organomegaly or mass.  EXTREMITIES: No pedal edema, cyanosis, or clubbing.  NEUROLOGIC: Cranial nerves II through XII are intact. Muscle strength 5/5 in all extremities. Sensation intact. Gait not checked.  PSYCHIATRIC: The patient is alert and oriented x 3.  SKIN: No obvious rash, lesion, or ulcer  LABORATORY PANEL:   CBC Recent Labs  Lab 10/23/2018 1655  WBC 7.5  HGB 13.6  HCT 44.2  PLT 192   ------------------------------------------------------------------------------------------------------------------  Chemistries  Recent Labs   Lab 11/02/2018 1655  NA 135  K 4.8  CL 92*  CO2 34*  GLUCOSE 201*  BUN 34*  CREATININE 1.31*  CALCIUM 8.8*  AST 23  ALT 29  ALKPHOS 67  BILITOT 0.5   ------------------------------------------------------------------------------------------------------------------  Cardiac Enzymes No results for input(s): TROPONINI in the last 168 hours. ------------------------------------------------------------------------------------------------------------------  RADIOLOGY:  Dg Abd 1 View  Result Date: 10/30/2018 CLINICAL DATA:  Abdominal distention EXAM: ABDOMEN - 1 VIEW COMPARISON:  None. FINDINGS: Limited study due to portable nature of the study and body habitus. I see no significant bowel dilatation or free air. Study otherwise limited. IMPRESSION: Very limited study. No visible changes of bowel obstruction or free air. Electronically Signed   By: Rolm Baptise M.D.   On: 10/22/2018 22:33   Dg Chest Port 1 View  Result Date: 11/11/2018 CLINICAL DATA:  Shortness of breath for 2 weeks. EXAM: PORTABLE CHEST 1 VIEW COMPARISON:  11/04/2010 radiograph FINDINGS: Cardiomegaly with pulmonary vascular congestion noted. Possible mild interstitial opacities/edema noted. No pneumothorax or acute bony abnormality. Bibasilar atelectasis noted. There may be trace pleural  effusions present. IMPRESSION: Cardiomegaly with pulmonary vascular congestion and possible mild interstitial edema. Bibasilar atelectasis.  Possible trace pleural effusions. Electronically Signed   By: Margarette Canada M.D.   On: 10/26/2018 17:13      IMPRESSION AND PLAN:   1.  Acute on chronic hypoxic respiratory failure - BiPAP therapy initiated on arrival to the emergency room - Patient will need PT VBG/ABG  2.  Obstructive sleep apnea - Patient will need to continue BiPAP therapy during hours of sleep  3.  Acute on chronic CHF, EF 55 to 60% on echo in 2014 at [facility] - Repeat echocardiogram - Lasix 40 mg IV twice daily - Telemetry  monitoring - EKG in the a.m. - Cardiology, Dr. Stanford Breed consulted for further evaluation and management recommendations -Continue to trend troponin levels  4.  History of atrial fibrillation - Continue Coumadin -Repeat INR in the a.m. -Telemetry monitoring  5.  Acute renal failure with BUN 34 and creatinine 1.31 - Repeat BMP in the a.m. and continue to monitor renal function closely  6.  Diabetes mellitus - Sliding scale insulin -Hemoglobin A1c  7.  Generalized weakness -Physical therapy consulted for supportive care  PPI prophylaxis    All the records are reviewed and case discussed with ED provider. The plan of care was discussed in details with the patient (and family). I answered all questions. The patient agreed to proceed with the above mentioned plan. Further management will depend upon hospital course.   CODE STATUS: Full code  TOTAL TIME TAKING CARE OF THIS PATIENT:45 minutes.    [place] on 10/26/2018 at 11:58 PM  Pager - [phone]  After 6pm go to [email or web address] - [employer] [facility]  Office  [phone]  CC: Primary care physician; [employer]   Note: This dictation was prepared with Diplomatic Services operational officer dictation along with smaller Company secretary. Any transcriptional errors that result from this process are unintentional.

## 2018-11-16 NOTE — Procedures (Signed)
Endotracheal Intubation: Patient required placement of an artificial airway secondary to Respiratory Failure  Consent: Emergent.   Hand washing performed prior to starting the procedure.   Medications administered for sedation prior to procedure:    Vecuronium 60 mg IV, Fentanyl 100 mcg IV.    A time out procedure was called and correct patient, name, & ID confirmed. Needed supplies and equipment were assembled and checked to include ETT, 10 ml syringe, Glidescope, Mac and Miller blades, suction, oxygen and bag mask valve, end tidal CO2 monitor.   Patient was positioned to align the mouth and pharynx to facilitate visualization of the glottis.   Heart rate, SpO2 and blood pressure was continuously monitored during the procedure. Pre-oxygenation was conducted prior to intubation and endotracheal tube was placed through the vocal cords into the trachea.     The artificial airway was placed under direct visualization via glidescope route using a 8.0 ETT on the first attempt.  ETT was secured at 24 cm mark.  Placement was confirmed by auscuitation of lungs with good breath sounds bilaterally and no stomach sounds.  Condensation was noted on endotracheal tube.   Pulse ox 98%.  CO2 detector in place with appropriate color change.   Complications: None .   OperatorLanney Gins MD  Chest radiograph ordered and pending.   Comments: OGT placed via glidescope.   Ottie Glazier, M.D.  [facility] [place]

## 2018-11-16 NOTE — ED Notes (Signed)
Patient resting quietly at this time.  No acute distress noted. 

## 2018-11-16 NOTE — ED Notes (Signed)
ED TO INPATIENT HANDOFF REPORT  ED Nurse Name and Phone #: Annie Main [phone]  S Name/Age/Gender Kyle White 58 y.o. male Room/Bed: ED18A/ED18A  Code Status   Code Status: Full Code  Home/SNF/Other Home Patient oriented to: self, place, time and situation Is this baseline? Yes   Triage Complete: Triage complete  Chief Complaint Weakness  Triage Note Pt to ED via EMS from home c/o increased weakness and some SOB x2 weeks.  Pt wears chronic 2L [place] but was found to be 78% by fire department so placed on NRB and up to 99%.  10L NRB with EMS maintaining 99%, ETCO2 75, CBG 187 and given 183mL NS bolus en route.  Presents A&Ox4, pt drowsy in and out sleeping.  EMS also reported afib RVR on their monitor with rate up to 175bpm.   Allergies No Known Allergies  Level of Care/Admitting Diagnosis ED Disposition    ED Disposition Condition [place]: [place] [[id number]]  Level of Care: Stepdown [14]  Covid Evaluation: Asymptomatic Screening Protocol (No Symptoms)  Diagnosis: Acute exacerbation of CHF (congestive heart failure) [id number]  Admitting Physician: Demetrios Loll [phone]  Attending Physician: Demetrios Loll [phone]  Estimated length of stay: past midnight tomorrow  Certification:: I certify this patient will need inpatient services for at least 2 midnights  PT Class (Do Not Modify): Inpatient [101]  PT Acc Code (Do Not Modify): Private [1]       B Medical/Surgery History Past Medical History:  Diagnosis Date  . Atrial fibrillation ([place])   . CHF (congestive heart failure) ([place])   . Diabetes mellitus without complication ([place])   . Sleep apnea    History reviewed. No pertinent surgical history.   A IV Location/Drains/Wounds Patient Lines/Drains/Airways Status   Active Line/Drains/Airways    Name:   Placement date:   Placement time:   Site:   Days:   Peripheral IV 11/11/2018 Left Hand   11/12/2018    1638    Hand   less than 1           Intake/Output Last 24 hours No intake or output data in the 24 hours ending 11/07/2018 1828  Labs/Imaging Results for orders placed or performed during the hospital encounter of 11/03/2018 (from the past 48 hour(s))  CBC with Differential     Status: None   Collection Time: 11/11/2018  4:55 PM  Result Value Ref Range   WBC 7.5 4.0 - 10.5 K/uL   RBC 4.51 4.22 - 5.81 MIL/uL   Hemoglobin 13.6 13.0 - 17.0 g/dL   HCT 44.2 39.0 - 52.0 %   MCV 98.0 80.0 - 100.0 fL   MCH 30.2 26.0 - 34.0 pg   MCHC 30.8 30.0 - 36.0 g/dL   RDW 13.2 11.5 - 15.5 %   Platelets 192 150 - 400 K/uL   nRBC 0.0 0.0 - 0.2 %   Neutrophils Relative % 75 %   Neutro Abs 5.7 1.7 - 7.7 K/uL   Lymphocytes Relative 11 %   Lymphs Abs 0.8 0.7 - 4.0 K/uL   Monocytes Relative 13 %   Monocytes Absolute 1.0 0.1 - 1.0 K/uL   Eosinophils Relative 0 %   Eosinophils Absolute 0.0 0.0 - 0.5 K/uL   Basophils Relative 0 %   Basophils Absolute 0.0 0.0 - 0.1 K/uL   Immature Granulocytes 1 %   Abs Immature Granulocytes 0.05 0.00 - 0.07 K/uL    Comment: Performed at [facility],  [place], [place] [address]  Protime-INR     Status: Abnormal   Collection Time: 11/10/2018  4:55 PM  Result Value Ref Range   Prothrombin Time 30.9 (H) 11.4 - 15.2 seconds   INR 3.0 (H) 0.8 - 1.2    Comment: (NOTE) INR goal varies based on device and disease states. Performed at [facility], [address] [place], [place] [address]   Comprehensive metabolic panel     Status: Abnormal   Collection Time: 11/04/2018  4:55 PM  Result Value Ref Range   Sodium 135 135 - 145 mmol/L   Potassium 4.8 3.5 - 5.1 mmol/L   Chloride 92 (L) 98 - 111 mmol/L   CO2 34 (H) 22 - 32 mmol/L   Glucose, Bld 201 (H) 70 - 99 mg/dL   BUN 34 (H) 6 - 20 mg/dL   Creatinine, Ser 1.31 (H) 0.61 - 1.24 mg/dL   Calcium 8.8 (L) 8.9 - 10.3 mg/dL   Total Protein 6.9 6.5 - 8.1 g/dL   Albumin 3.6 3.5 - 5.0 g/dL   AST 23 15 - 41 U/L   ALT 29 0 -  44 U/L   Alkaline Phosphatase 67 38 - 126 U/L   Total Bilirubin 0.5 0.3 - 1.2 mg/dL   GFR calc non Af Amer 59 (L) >60 mL/min   GFR calc Af Amer >60 >60 mL/min   Anion gap 9 5 - 15    Comment: Performed at [facility], [address], [place], [place] [address]  Ethanol     Status: None   Collection Time: 11/10/2018  4:55 PM  Result Value Ref Range   Alcohol, Ethyl (B) <10 <10 mg/dL    Comment: (NOTE) Lowest detectable limit for serum alcohol is 10 mg/dL. For medical purposes only. Performed at [facility], [address] [place], [place] [address]   Brain natriuretic peptide     Status: Abnormal   Collection Time: 10/28/2018  4:55 PM  Result Value Ref Range   B Natriuretic Peptide 389.0 (H) 0.0 - 100.0 pg/mL    Comment: Performed at [facility], [place], [place] [address]  Troponin I (High Sensitivity)     Status: None   Collection Time: 10/20/2018  4:55 PM  Result Value Ref Range   Troponin I (High Sensitivity) 14 <18 ng/L    Comment: (NOTE) Elevated high sensitivity troponin I (hsTnI) values and significant  changes across serial measurements may suggest ACS but many other  chronic and acute conditions are known to elevate hsTnI results.  Refer to the "Links" section for chest pain algorithms and additional  guidance. Performed at [facility], [address] [place], [place] [address]   Blood gas, venous     Status: Abnormal (Preliminary result)   Collection Time: 11/17/2018  4:58 PM  Result Value Ref Range   pH, Ven 7.20 (L) 7.250 - 7.430   pCO2, Ven 95 (HH) 44.0 - 60.0 mmHg    Comment: CRITICAL RESULT CALLED TO, READ BACK BY AND VERIFIED WITH: NOTIFIED DR.MCSHANE AT 1730 ON 11/13/2018 BY GKM    pO2, Ven 51.0 (H) 32.0 - 45.0 mmHg   Bicarbonate 37.1 (H) 20.0 - 28.0 mmol/L   Acid-Base Excess 5.6 (H) 0.0 - 2.0 mmol/L   O2 Saturation 76.6 %   Patient temperature 37.0    Collection site PENDING    Sample type VENOUS      Comment: Performed at [facility],  [place], [place] [address]   Dg Chest Port 1 View  Result Date: 11/17/2018 CLINICAL DATA:  Shortness of breath for 2 weeks. EXAM: PORTABLE CHEST 1 VIEW COMPARISON:  11/04/2010 radiograph FINDINGS: Cardiomegaly with pulmonary vascular congestion noted. Possible mild interstitial opacities/edema noted. No pneumothorax or acute bony abnormality. Bibasilar atelectasis noted. There may be trace pleural effusions present. IMPRESSION: Cardiomegaly with pulmonary vascular congestion and possible mild interstitial edema. Bibasilar atelectasis.  Possible trace pleural effusions. Electronically Signed   By: Margarette [place] M.D.   On: 10/22/2018 17:13    Pending Labs Unresulted Labs (From admission, onward)    Start     Ordered   11/17/18 1820  Protime-INR  Once,   STAT     11/13/2018 1821   11/17/18 [id number]  Basic metabolic panel  Daily,   STAT     10/26/2018 1821   11/12/2018 1830  Type and screen  Once,   STAT     10/23/2018 1830   11/19/2018 1821  HIV antibody (Routine Testing)  Once,   STAT     11/01/2018 1821   11/07/2018 1650  Urinalysis, Complete w Microscopic  ONCE - STAT,   STAT     11/08/2018 1650   11/12/2018 1650  SARS CORONAVIRUS 2 (TAT 6-12 HRS) Nasal Swab Aptima Multi Swab  (Asymptomatic/Tier 2 Patients Labs)  Once,   STAT    Question Answer Comment  Is this test for diagnosis or screening Screening   Symptomatic for COVID-19 as defined by CDC No   Hospitalized for COVID-19 No   Admitted to ICU for COVID-19 No   Previously tested for COVID-19 No   Resident in a congregate (group) care setting No   Employed in healthcare setting No      10/20/2018 1650          Vitals/Pain Today's Vitals   10/24/2018 1643 11/04/2018 1647 11/03/2018 1649  BP: (!) 142/92    Pulse: 98    Resp: 19    Temp: 97.7 F (36.5 C)    TempSrc: Oral    SpO2: 98%    Weight:   (!) 158 kg  Height:   5\' 9"  (1.753 m)  PainSc:  0-No pain     Isolation Precautions No  active isolations  Medications Medications  furosemide (LASIX) injection 40 mg (has no administration in time range)  aspirin EC tablet 81 mg (has no administration in time range)  colchicine tablet 12 mg (has no administration in time range)  diltiazem (CARDIZEM CD) 24 hr capsule 240 mg (has no administration in time range)  lisinopril (ZESTRIL) tablet 5 mg (has no administration in time range)  metoprolol tartrate (LOPRESSOR) tablet 50 mg (has no administration in time range)  pravastatin (PRAVACHOL) tablet 80 mg (has no administration in time range)  warfarin (COUMADIN) tablet 2.5-3.75 mg (has no administration in time range)  sodium chloride flush (NS) 0.9 % injection 3 mL (has no administration in time range)  sodium chloride flush (NS) 0.9 % injection 3 mL (has no administration in time range)  0.9 %  sodium chloride infusion (has no administration in time range)  acetaminophen (TYLENOL) tablet 650 mg (has no administration in time range)  ondansetron (ZOFRAN) injection 4 mg (has no administration in time range)  furosemide (LASIX) injection 40 mg (has no administration in time range)    Mobility walks with person assist Low fall risk   Focused Assessments Cardiac Assessment Handoff:  Cardiac Rhythm: Atrial fibrillation No results  found for: CKTOTAL, CKMB, CKMBINDEX, TROPONINI No results found for: DDIMER Does the Patient currently have chest pain? No      R Recommendations: See Admitting Provider Note  Report given to:   Additional Notes:

## 2018-11-16 NOTE — ED Notes (Signed)
Into room to answer call bell.  Patient not allowing IV team to start another IV.  Explained process to patient.

## 2018-11-16 NOTE — Consult Note (Addendum)
PULMONARY / CRITICAL CARE MEDICINE  Name: Kyle White MRN: [id number] DOB: [birth year]    LOS: 0  Referring Provider:  Gardiner Barefoot, NP Reason for Referral:  Acute hypoxic/hypercarbic respiratory failure Brief patient description:  This is a 58 y/o male with morbid obesity who presents with acute on chronic hypoxic and hypercarbic respiratory failure; started on BiPAP, decompensated requiring emergent endotracheal intubation.  HPI: This is a 58 year old male with a medical history significant for morbid obesity, obstructive sleep apnea, type 2 diabetes, atrial fibrillation, hypertension, congestive heart failure and mitral valve replacement who presented to the ED with complaints of acute dyspnea and generalized weakness that started 2 weeks ago and progressively got worse.  When EMS arrived, patient's SPO2 was 78% on 2 L nasal cannula.  He was placed on a nonrebreather and transferred to the emergency room.  His end-tidal CO2 was 75 and he was lethargic.  His ED work-up was significant for diffuse pulmonary congestion on chest x-ray, proBNP of 389, creatinine of 1.31 and his VBG showed a pH of 7.20, PCO2 of 95, PO2 of 51 and an SPO2 of 76%.  He was admitted to the ICU on BiPAP for further management. Upon arrival in the ICU, patient became acutely agitated and his SPO2 to drop significantly.  Given his high risk for respiratory arrest, family was contacted and consented to emergent intubation. Based on patient's records from outside facilities, he is suppose to be on home BiPAP for OSA but he is currently on supplemental O2 at 2L [place] and uses CPAP at at bedtime per his sister.  Per his sister, he lives with his girlfriend who is his caregiver.    Past Medical History:  Diagnosis Date  . Atrial fibrillation ([place])   . CHF (congestive heart failure) ([place])   . Diabetes mellitus without complication ([place])   . Gout   . Hypertension associated with type 2 diabetes mellitus ([place])   . Morbid obesity  ([place])   . Obstructive sleep apnea    Past Surgical History:  Procedure Laterality Date  . MITRAL VALVE REPAIR     No current facility-administered medications on file prior to encounter.    Current Outpatient Medications on File Prior to Encounter  Medication Sig  . aspirin EC 81 MG tablet Take 81 mg by mouth daily.  . colchicine 0.6 MG tablet Take 12 mg by mouth. At first sing of gout flare  . diltiazem (CARDIZEM CD) 240 MG 24 hr capsule Take 240 mg by mouth daily.  . furosemide (LASIX) 40 MG tablet Take 40 mg by mouth 2 (two) times daily.  Marland Kitchen lisinopril (ZESTRIL) 5 MG tablet Take 5 mg by mouth daily.  . metFORMIN (GLUCOPHAGE) 500 MG tablet Take 500 mg by mouth 2 (two) times daily with a meal.  . metoprolol tartrate (LOPRESSOR) 100 MG tablet Take 50 mg by mouth 2 (two) times daily.  . pravastatin (PRAVACHOL) 80 MG tablet Take 80 mg by mouth every evening.  . warfarin (COUMADIN) 4 MG tablet Take 2.5-4 mg by mouth daily. Take 4 mg on Sunday, Tuesday, Wednesday, Thursday, and Saturday. Take 2.5 mg on Monday and Friday    Allergies No Known Allergies  Family History History reviewed. No pertinent family history. Social History  reports that he has never smoked. He has never used smokeless tobacco. He reports that he does not drink alcohol or use drugs.  Review Of Systems: Unable to obtain as patient is in acute respiratory distress and very lethargic and combative  VITAL SIGNS: BP (!) 143/59 (BP Location: Right Arm)   Pulse (!) 118   Temp 97.7 F (36.5 C) (Oral)   Resp 20   Ht 5\' 9"  (1.753 m)   Wt (!) 158 kg   SpO2 (!) 40%   BMI 51.44 kg/m   HEMODYNAMICS:    VENTILATOR SETTINGS:    INTAKE / OUTPUT: No intake/output data recorded.  PHYSICAL EXAMINATION: General: Well-developed, appears older for age, in acute respiratory distress HEENT: PERRLA, trachea midline, mild JVD, neck is short Neuro: Combative, moves all extremities, unable to follow commands, corneal and  gag reflexes intact Cardiovascular: Apical pulse tachycardic, irregular, S1-S2, no murmur regurg or gallop, +2 pulses bilaterally, trace edema Lungs: Increased work of breathing, tachypneic, breath sounds diminished in all lung fields, no wheezing Abdomen: Profound central obesity, hypoactive bowel sounds, abdomen firm without tenderness on palpation Musculoskeletal: Positive range of motion, no joint deformities Skin: Warm, dry, pale with mild cyanosis of the lower and upper extremities  LABS:  BMET Recent Labs  Lab 11/10/2018 1655  NA 135  K 4.8  CL 92*  CO2 34*  BUN 34*  CREATININE 1.31*  GLUCOSE 201*    Electrolytes Recent Labs  Lab 11/10/2018 1655  CALCIUM 8.8*    CBC Recent Labs  Lab 11/07/2018 1655  WBC 7.5  HGB 13.6  HCT 44.2  PLT 192    Coag's Recent Labs  Lab 11/07/2018 1655  INR 3.0*    Sepsis Markers No results for input(s): LATICACIDVEN, PROCALCITON, O2SATVEN in the last 168 hours.  ABG No results for input(s): PHART, PCO2ART, PO2ART in the last 168 hours.  Liver Enzymes Recent Labs  Lab 11/07/2018 1655  AST 23  ALT 29  ALKPHOS 67  BILITOT 0.5  ALBUMIN 3.6    Cardiac Enzymes No results for input(s): TROPONINI, PROBNP in the last 168 hours.  Glucose Recent Labs  Lab 10/20/2018 2145  GLUCAP 138*    Imaging Dg Chest Port 1 View  Result Date: 11/18/2018 CLINICAL DATA:  Shortness of breath for 2 weeks. EXAM: PORTABLE CHEST 1 VIEW COMPARISON:  11/04/2010 radiograph FINDINGS: Cardiomegaly with pulmonary vascular congestion noted. Possible mild interstitial opacities/edema noted. No pneumothorax or acute bony abnormality. Bibasilar atelectasis noted. There may be trace pleural effusions present. IMPRESSION: Cardiomegaly with pulmonary vascular congestion and possible mild interstitial edema. Bibasilar atelectasis.  Possible trace pleural effusions. Electronically Signed   By: Margarette Canada M.D.   On: 10/20/2018 17:13    STUDIES:  2D echo  pending  CULTURES: Blood cultures x2 Urine culture Sputum culture  ANTIBIOTICS: None  SIGNIFICANT EVENTS: 11/19/2018: Admitted  LINES/TUBES: ET tube Foley catheter OG tube Peripheral IVs Midline  DISCUSSION: This is a 58 year old male with a history of uncontrolled obstructive sleep apnea and obesity hypoventilation syndrome presenting with acute on chronic hypoxic and hypercarbic respiratory failure and acute CHF exacerbation requiring mechanical ventilation  ASSESSMENT / PLAN:  PULMONARY A: Acute on chronic hypoxic and hypercarbic respiratory failure Obstructive sleep apnea Obesity hypoventilation syndrome Pulmonary edema P:   Full vent support with current settings ABG and chest x-ray post intubation pending VAP protocol Nebulized bronchodilators Serial ABGs as needed Patient will probably benefit from home BiPAP Once extubated maintain on mandatory nocturnal BiPAP Palliative care consult for goals of care  CARDIOVASCULAR A:  Acute CHF exacerbation-last echocardiogram done at [facility] on 11/12/2012 showed left ventricular hypertrophy with an EF of 55 to 60% Atrial fibrillation Hypertension P:  Currently on Coumadin.  Continue to go to pharmacy  Hemodynamics per ICU protocol Hold antihypertensives in light of marginal blood pressures Repeat 2D echo in the morning  RENAL A:   Acute renal failure-creatinine 1.31 with a GFR of 59 P:   Trend creatinine Renally dose medications and avoid nephrotoxic medications Monitor and correct electrolytes  GASTROINTESTINAL A:   Abdominal distention- rule out perforation P:   Stat KUB-reviewed with no acute changes; image quality suboptimal due to body habitus NG tube to low intermittent suction Bowel regimen per protocol  HEMATOLOGIC A:   Coagulopathy due to long-term use of anticoagulation for atrial fibrillation P:  Monitor PT/INR and platelets INR goal 2-3.  INFECTIOUS A:   Rule out COVID-19 in light of  respiratory failure P:   Rapid COVID screen sent, awaiting results  ENDOCRINE A:   Type 2 diabetes mellitus P:   Blood glucose monitoring with sliding scale insulin coverage Hemoglobin A1c with a.m. labs  NEUROLOGIC A:   Acute encephalopathy secondary to hypercarbia and hypoxia P:   RASS goal: 0 to -1 Fentanyl infusion and PRN Versed for vent sedation and discomfort Monitor neurological status and consider CT head for persistent encephalopathy with improved hypercarbia and hypoxemia  Best Practice: Code Status: Full code Diet: N.p.o. GI prophylaxis: PPI VTE prophylaxis: SCDs and Coumadin  FAMILY  - Updates: [name] sister(Susan Hinton) and brother(William Horger) updated on patient's status. All questions answered  COVID-19 DISASTER DECLARATION:   FULL CONTACT PHYSICAL EXAMINATION WAS NOT POSSIBLE DUE TO TREATMENT OF COVID-19  AND CONSERVATION OF PERSONAL PROTECTIVE EQUIPMENT, LIMITED EXAM FINDINGS INCLUDE-  Patient assessed or the symptoms described in the history of present illness.  In the context of the Global COVID-19 pandemic, which necessitated consideration that the patient might be at risk for infection with the SARS-CoV-2 virus that causes COVID-19, Institutional protocols and algorithms that pertain to the evaluation of patients at risk for COVID-19 are in a state of rapid change based on information released by regulatory bodies including the CDC and federal and state organizations. These policies and algorithms were followed during the patient's care while in hospital.   Natina Wiginton S. Tukov-Yual, ANP-BC Pulmonary and Stem Pager [phone] or [phone]  NB: This document was prepared using Dragon voice recognition software and may include unintentional dictation errors.    10/24/2018, 10:29 PM

## 2018-11-16 NOTE — ED Triage Notes (Signed)
Pt to ED via EMS from home c/o increased weakness and some SOB x2 weeks.  Pt wears chronic 2L [place] but was found to be 78% by fire department so placed on NRB and up to 99%.  10L NRB with EMS maintaining 99%, ETCO2 75, CBG 187 and given 145mL NS bolus en route.  Presents A&Ox4, pt drowsy in and out sleeping.  EMS also reported afib RVR on their monitor with rate up to 175bpm.

## 2018-11-16 NOTE — Progress Notes (Signed)
PT Cancellation Note  Patient Details Name: Kyle White MRN: [id number] DOB: 1960-10-19   Cancelled Treatment:    Reason Eval/Treat Not Completed: Medical issues which prohibited therapy(Consult received and chart reviewed.  Per notes, patient now intubated/sedated.  Will complete initial order due to decline in medical status.  Please re-consult as medically appropriate.)  Loucinda Croy H. Owens Shark, PT, DPT, NCS 10/28/2018, 10:50 PM [phone]

## 2018-11-16 NOTE — ED Provider Notes (Signed)
[facility] Emergency Department Provider Note  ____________________________________________   I have reviewed the triage vital signs and the nursing notes. Where available I have reviewed prior notes and, if possible and indicated, outside hospital notes.   Patient seen and evaluated during the coronavirus epidemic during a time with low staffing  Patient seen for the symptoms described in the history of present illness. She was evaluated in the context of the global COVID-19 pandemic, which necessitated consideration that the patient might be at risk for infection with the SARS-CoV-2 virus that causes COVID-19. Institutional protocols and algorithms that pertain to the evaluation of patients at risk for COVID-19 are in a state of rapid change based on information released by regulatory bodies including the CDC and federal and state organizations. These policies and algorithms were followed during the patient's care in the ED.    HISTORY  Chief Complaint Shortness of Breath and Weakness    HPI Kyle White is a 58 y.o. male  Who unfortunately has a very significantly elevated BMI, takes Coumadin for atrial fibrillation, history of sedentary lifestyle, on 2 L oxygen, somewhat poor historian, presents today complaining of feeling generally weak for the last 2 weeks.  Finally decided to call 911.  No fall no trauma.  No headache.  Has had "maybe a little" shortness of breath.  Believes that he is compliant with his Lasix to the extent that he can recall.  Does have a history of CHF.   Does not usually get around very much however has been eating and drinking very well over the last couple weeks despite feeling generally weak.  No chest pain, has chronic leg swelling does not feel like it is not much different.  Does not feel that his abdominal dimensions are different from normal. This is pretty much the history family also gets when they arrive.   Past Medical History:   Diagnosis Date  . Atrial fibrillation ([place])   . CHF (congestive heart failure) ([place])   . Diabetes mellitus without complication ([place])   . Sleep apnea     There are no active problems to display for this patient.   History reviewed. No pertinent surgical history.  Prior to Admission medications   Not on File    Allergies Patient has no known allergies.  History reviewed. No pertinent family history.  Social History Social History   Tobacco Use  . Smoking status: Never Smoker  . Smokeless tobacco: Never Used  Substance Use Topics  . Alcohol use: Never    Frequency: Never  . Drug use: Never    Review of Systems Constitutional: No fever/chills Eyes: No visual changes. ENT: No sore throat. No stiff neck no neck pain Cardiovascular: Denies chest pain. Respiratory: +shortness of breath. Gastrointestinal:   no vomiting.  No diarrhea.  No constipation. Genitourinary: Negative for dysuria. Musculoskeletal: Negative lower extremity swelling Skin: Negative for rash. Neurological: Negative for severe headaches, focal weakness or numbness.   ____________________________________________   PHYSICAL EXAM:  VITAL SIGNS: ED Triage Vitals  Enc Vitals Group     BP 10/26/2018 1643 (!) 142/92     Pulse Rate 11/03/2018 1643 98     Resp 11/15/2018 1643 19     Temp 11/03/2018 1643 97.7 F (36.5 C)     Temp Source 11/03/2018 1643 Oral     SpO2 11/04/2018 1643 98 %     Weight 11/15/2018 1649 (!) 348 lb 5.2 oz (158 kg)     Height 11/18/2018 1649  5\' 9"  (1.753 m)     Head Circumference --      Peak Flow --      Pain Score 11/17/2018 1647 0     Pain Loc --      Pain Edu? --      Excl. in [place]? --     Constitutional: Alert and oriented. Well appearing and in no acute distress. Eyes: Conjunctivae are normal Head: Atraumatic HEENT: No congestion/rhinnorhea. Mucous membranes are moist.  Oropharynx non-erythematous Neck:   Nontender with no meningismus, no masses, no stridor Cardiovascular:  Normal rate, regular rhythm. Grossly normal heart sounds.  Good peripheral circulation. Respiratory: Normal respiratory effort.  No retractions. Lungs CTAB. Abdominal: Soft and nontender. No distention. No guarding no rebound, remarkable adiposity noted Back:  There is no focal tenderness or step off.  there is no midline tenderness there are no lesions noted. there is no CVA tenderness Musculoskeletal: No lower extremity tenderness, no upper extremity tenderness. No joint effusions, no DVT signs strong distal pulses no edema Neurologic:  Normal speech and language. No gross focal neurologic deficits are appreciated.  Skin:  Skin is warm, dry and intact. No rash noted. Psychiatric: Mood and affect are normal. Speech and behavior are normal.  ____________________________________________   LABS (all labs ordered are listed, but only abnormal results are displayed)  Labs Reviewed  PROTIME-INR - Abnormal; Notable for the following components:      Result Value   Prothrombin Time 30.9 (*)    INR 3.0 (*)    All other components within normal limits  BLOOD GAS, VENOUS - Abnormal; Notable for the following components:   pH, Ven 7.20 (*)    pCO2, Ven 95 (*)    pO2, Ven 51.0 (*)    Bicarbonate 37.1 (*)    Acid-Base Excess 5.6 (*)    All other components within normal limits  SARS CORONAVIRUS 2 (TAT 6-12 HRS)  CBC WITH DIFFERENTIAL/PLATELET  ETHANOL  COMPREHENSIVE METABOLIC PANEL  URINALYSIS, COMPLETE (UACMP) WITH MICROSCOPIC  BRAIN NATRIURETIC PEPTIDE  TYPE AND SCREEN  TROPONIN I (HIGH SENSITIVITY)    Pertinent labs  results that were available during my care of the patient were reviewed by me and considered in my medical decision making (see chart for details). ____________________________________________  EKG  I personally interpreted any EKGs ordered by me or triage Neutral rate 99 normal axis no acute ST elevation or  depression ____________________________________________  RADIOLOGY  Pertinent labs & imaging results that were available during my care of the patient were reviewed by me and considered in my medical decision making (see chart for details). If possible, patient and/or family made aware of any abnormal findings.  Dg Chest Port 1 View  Result Date: 10/28/2018 CLINICAL DATA:  Shortness of breath for 2 weeks. EXAM: PORTABLE CHEST 1 VIEW COMPARISON:  11/04/2010 radiograph FINDINGS: Cardiomegaly with pulmonary vascular congestion noted. Possible mild interstitial opacities/edema noted. No pneumothorax or acute bony abnormality. Bibasilar atelectasis noted. There may be trace pleural effusions present. IMPRESSION: Cardiomegaly with pulmonary vascular congestion and possible mild interstitial edema. Bibasilar atelectasis.  Possible trace pleural effusions. Electronically Signed   By: Margarette Canada M.D.   On: 11/05/2018 17:13   ____________________________________________    PROCEDURES  Procedure(s) performed: None  Procedures  Critical Care performed: None  ____________________________________________   INITIAL IMPRESSION / ASSESSMENT AND PLAN / ED COURSE  Pertinent labs & imaging results that were available during my care of the patient were reviewed by me and considered in  my medical decision making (see chart for details).   Patient here with shortness of breath and feeling tired.  He is awake and alert but I did check a VBG on him which suggests he is having perhaps some element of acute on chronic CO2 retention.  We will try BiPAP see if we can get that down for him.  Chest x-ray shows possible edema.  BNP and protein are pending.  He may benefit from diuresis, and improve ventilation and admission.    ____________________________________________   FINAL CLINICAL IMPRESSION(S) / ED DIAGNOSES  Final diagnoses:  SOB (shortness of breath)      This chart was dictated using voice  recognition software.  Despite best efforts to proofread,  errors can occur which can change meaning.      Schuyler Amor, MD 11/03/2018 [phone]

## 2018-11-17 ENCOUNTER — Inpatient Hospital Stay: Payer: Medicare Other

## 2018-11-17 ENCOUNTER — Inpatient Hospital Stay
Admit: 2018-11-17 | Discharge: 2018-11-17 | Disposition: A | Discharge status: Home / Self Care | Payer: Medicare Other | Attending: Nurse Practitioner | Admitting: Nurse Practitioner

## 2018-11-17 LAB — COMPREHENSIVE METABOLIC PANEL
ALT: 25 U/L (ref 0–44)
AST: 20 U/L (ref 15–41)
Albumin: 3.5 g/dL (ref 3.5–5.0)
Alkaline Phosphatase: 64 U/L (ref 38–126)
Anion gap: 11 (ref 5–15)
BUN: 34 mg/dL — ABNORMAL HIGH (ref 6–20)
CO2: 31 mmol/L (ref 22–32)
Calcium: 8.7 mg/dL — ABNORMAL LOW (ref 8.9–10.3)
Chloride: 94 mmol/L — ABNORMAL LOW (ref 98–111)
Creatinine, Ser: 1.28 mg/dL — ABNORMAL HIGH (ref 0.61–1.24)
GFR calc Af Amer: >60 mL/min (ref >60)
GFR calc non Af Amer: >60 mL/min (ref >60)
Glucose, Bld: 172 mg/dL — ABNORMAL HIGH (ref 70–99)
Potassium: 4.8 mmol/L (ref 3.5–5.1)
Sodium: 136 mmol/L (ref 135–145)
Total Bilirubin: 1 mg/dL (ref 0.3–1.2)
Total Protein: 6.6 g/dL (ref 6.5–8.1)

## 2018-11-17 LAB — CBC WITH DIFFERENTIAL/PLATELET
Abs Immature Granulocytes: 0.04 10*3/uL (ref 0.00–0.07)
Basophils Absolute: 0 10*3/uL (ref 0.0–0.1)
Basophils Relative: 0 %
Eosinophils Absolute: 0 10*3/uL (ref 0.0–0.5)
Eosinophils Relative: 0 %
HCT: 43.1 % (ref 39.0–52.0)
Hemoglobin: 13.6 g/dL (ref 13.0–17.0)
Immature Granulocytes: 1 %
Lymphocytes Relative: 12 %
Lymphs Abs: 0.9 10*3/uL (ref 0.7–4.0)
MCH: 30.4 pg (ref 26.0–34.0)
MCHC: 31.6 g/dL (ref 30.0–36.0)
MCV: 96.4 fL (ref 80.0–100.0)
Monocytes Absolute: 1.1 10*3/uL — ABNORMAL HIGH (ref 0.1–1.0)
Monocytes Relative: 14 %
Neutro Abs: 5.7 10*3/uL (ref 1.7–7.7)
Neutrophils Relative %: 73 %
Platelets: 166 10*3/uL (ref 150–400)
RBC: 4.47 MIL/uL (ref 4.22–5.81)
RDW: 13.1 % (ref 11.5–15.5)
WBC: 7.7 10*3/uL (ref 4.0–10.5)
nRBC: 0 % (ref 0.0–0.2)

## 2018-11-17 LAB — BLOOD GAS, ARTERIAL
Acid-Base Excess: 6.6 mmol/L — ABNORMAL HIGH (ref 0.0–2.0)
Bicarbonate: 30.5 mmol/L — ABNORMAL HIGH (ref 20.0–28.0)
FIO2: 0.6
MECHVT: 500 mL
Mechanical Rate: 25
O2 Saturation: 95.5 %
PEEP: 8 cmH2O
Patient temperature: 37
pCO2 arterial: 40 mmHg (ref 32.0–48.0)
pH, Arterial: 7.49 — ABNORMAL HIGH (ref 7.350–7.450)
pO2, Arterial: 72 mmHg — ABNORMAL LOW (ref 83.0–108.0)

## 2018-11-17 LAB — BASIC METABOLIC PANEL
Anion gap: 11 (ref 5–15)
BUN: 35 mg/dL — ABNORMAL HIGH (ref 6–20)
CO2: 30 mmol/L (ref 22–32)
Calcium: 8.7 mg/dL — ABNORMAL LOW (ref 8.9–10.3)
Chloride: 94 mmol/L — ABNORMAL LOW (ref 98–111)
Creatinine, Ser: 1.35 mg/dL — ABNORMAL HIGH (ref 0.61–1.24)
GFR calc Af Amer: >60 mL/min (ref >60)
GFR calc non Af Amer: 57 mL/min — ABNORMAL LOW (ref >60)
Glucose, Bld: 222 mg/dL — ABNORMAL HIGH (ref 70–99)
Potassium: 5.1 mmol/L (ref 3.5–5.1)
Sodium: 135 mmol/L (ref 135–145)

## 2018-11-17 LAB — MAGNESIUM: Magnesium: 2.6 mg/dL — ABNORMAL HIGH (ref 1.7–2.4)

## 2018-11-17 LAB — PROTIME-INR
INR: 3.2 — ABNORMAL HIGH (ref 0.8–1.2)
INR: 3.3 — ABNORMAL HIGH (ref 0.8–1.2)
Prothrombin Time: 32.3 seconds — ABNORMAL HIGH (ref 11.4–15.2)
Prothrombin Time: 33 seconds — ABNORMAL HIGH (ref 11.4–15.2)

## 2018-11-17 LAB — URINALYSIS, COMPLETE (UACMP) WITH MICROSCOPIC
Bacteria, UA: NONE SEEN
Bilirubin Urine: NEGATIVE
Glucose, UA: NEGATIVE mg/dL
Hgb urine dipstick: NEGATIVE
Ketones, ur: NEGATIVE mg/dL
Leukocytes,Ua: NEGATIVE
Nitrite: NEGATIVE
Protein, ur: 100 mg/dL — AB
Specific Gravity, Urine: 1.018 (ref 1.005–1.030)
pH: 6 (ref 5.0–8.0)

## 2018-11-17 LAB — TRIGLYCERIDES: Triglycerides: 113 mg/dL (ref <150)

## 2018-11-17 LAB — MRSA PCR SCREENING: MRSA by PCR: NEGATIVE

## 2018-11-17 LAB — PHOSPHORUS: Phosphorus: 5 mg/dL — ABNORMAL HIGH (ref 2.5–4.6)

## 2018-11-17 LAB — GLUCOSE, CAPILLARY
Glucose-Capillary: 157 mg/dL — ABNORMAL HIGH (ref 70–99)
Glucose-Capillary: 235 mg/dL — ABNORMAL HIGH (ref 70–99)
Glucose-Capillary: 237 mg/dL — ABNORMAL HIGH (ref 70–99)
Glucose-Capillary: 245 mg/dL — ABNORMAL HIGH (ref 70–99)
Glucose-Capillary: 245 mg/dL — ABNORMAL HIGH (ref 70–99)
Glucose-Capillary: 259 mg/dL — ABNORMAL HIGH (ref 70–99)

## 2018-11-17 LAB — ECHOCARDIOGRAM COMPLETE
Height: 69 in
Weight: 6109.39 oz

## 2018-11-17 LAB — TROPONIN I (HIGH SENSITIVITY): Troponin I (High Sensitivity): 23 ng/L — ABNORMAL HIGH (ref <18)

## 2018-11-17 LAB — SARS CORONAVIRUS 2 (TAT 6-24 HRS): SARS Coronavirus 2: NEGATIVE

## 2018-11-17 LAB — LACTIC ACID, PLASMA
Lactic Acid, Venous: 2 mmol/L (ref 0.5–1.9)
Lactic Acid, Venous: 2.3 mmol/L (ref 0.5–1.9)

## 2018-11-17 LAB — HEMOGLOBIN A1C
Hgb A1c MFr Bld: 7.3 % — ABNORMAL HIGH (ref 4.8–5.6)
Mean Plasma Glucose: 162.81 mg/dL

## 2018-11-17 LAB — PROCALCITONIN: Procalcitonin: <0.1 ng/mL

## 2018-11-17 MED ORDER — PROPOFOL 1000 MG/100ML IV EMUL
INTRAVENOUS | Status: AC
  Administered 2018-11-17: 5 ug/kg/min via INTRAVENOUS
  Filled 2018-11-17: qty 100

## 2018-11-17 MED ORDER — VECURONIUM BROMIDE 10 MG IV SOLR
10.0000 mg | INTRAVENOUS | Status: DC | PRN
  Administered 2018-11-17 – 2018-11-25 (×7): 10 mg via INTRAVENOUS
  Filled 2018-11-17 (×9): qty 10

## 2018-11-17 MED ORDER — VECURONIUM BROMIDE 10 MG IV SOLR
INTRAVENOUS | Status: AC
  Filled 2018-11-17: qty 10

## 2018-11-17 MED ORDER — IOHEXOL 240 MG/ML SOLN
25.0000 mL | INTRAMUSCULAR | Status: AC
  Administered 2018-11-17 (×2): 25 mL via ORAL

## 2018-11-17 MED ORDER — PROPOFOL 1000 MG/100ML IV EMUL
5.0000 ug/kg/min | INTRAVENOUS | Status: DC
  Administered 2018-11-17: 25 ug/kg/min via INTRAVENOUS
  Administered 2018-11-17: 18:00:00 5 ug/kg/min via INTRAVENOUS
  Administered 2018-11-18 (×2): 25 ug/kg/min via INTRAVENOUS
  Filled 2018-11-17 (×4): qty 100

## 2018-11-17 MED ORDER — MIDAZOLAM 50MG/50ML (1MG/ML) PREMIX INFUSION: 2.0000 mg/h | INTRAVENOUS | Status: DC

## 2018-11-17 MED ORDER — DEXMEDETOMIDINE HCL IN NACL 400 MCG/100ML IV SOLN
0.0000 ug/kg/h | INTRAVENOUS | Status: DC
  Administered 2018-11-17 (×2): 1.2 ug/kg/h via INTRAVENOUS
  Administered 2018-11-17: 1 ug/kg/h via INTRAVENOUS
  Administered 2018-11-17: 1.2 ug/kg/h via INTRAVENOUS
  Administered 2018-11-17: 1 ug/kg/h via INTRAVENOUS
  Filled 2018-11-17 (×7): qty 100

## 2018-11-17 MED ORDER — FUROSEMIDE 10 MG/ML IJ SOLN
60.0000 mg | Freq: Two times a day (BID) | INTRAMUSCULAR | Status: DC
  Administered 2018-11-17: 60 mg via INTRAVENOUS
  Filled 2018-11-17 (×2): qty 6

## 2018-11-17 NOTE — Progress Notes (Signed)
*  PRELIMINARY RESULTS* Echocardiogram 2D Echocardiogram has been performed.  Kyle White 11/17/2018, 8:35 AM

## 2018-11-17 NOTE — Progress Notes (Signed)
CRITICAL CARE PROGRESS NOTE    Name: Kyle White MRN: [id number] DOB: Aug 05, 1960     LOS: 1   SUBJECTIVE FINDINGS & SIGNIFICANT EVENTS   Patient description:  58 yo w/hx of morbid obesity, HFpEF hx MVR, CHF, PAF, OSA, DM, AHRF came in with acute hypoxemic hypercarbic resp failure with CXR showing b/l pl effusions and pulm edema. He also has a distended obese abdomen with KUB poorly described due to body habitus.     Lines / Drains: Midline   Cultures / Sepsis markers: Blood and resp cx  Antibiotics: none   Protocols / Consultants: Cardiology   Tests / Events: TTE  Overnight: Intubated due to failure of BIPAP Met with patient's sister [name].  We discussed diagnostic tests including CT abdomen and chest imaging, we discussed care plan. She is thankful for care.   PAST MEDICAL HISTORY   Past Medical History:  Diagnosis Date   Atrial fibrillation (HCC)    CHF (congestive heart failure) ([place])    Diabetes mellitus without complication ([place])    Gout    Hypertension associated with type 2 diabetes mellitus ([place])    Morbid obesity ([place])    Obstructive sleep apnea      SURGICAL HISTORY   Past Surgical History:  Procedure Laterality Date   MITRAL VALVE REPAIR       FAMILY HISTORY   History reviewed. No pertinent family history.   SOCIAL HISTORY   Social History   Tobacco Use   Smoking status: Never Smoker   Smokeless tobacco: Never Used  Substance Use Topics   Alcohol use: Never    Frequency: Never   Drug use: Never     MEDICATIONS   Current Medication:  Current Facility-Administered Medications:    0.9 %  sodium chloride infusion, 250 mL, Intravenous, PRN, Seals, Theo Dills, NP   acetaminophen (TYLENOL) tablet 650 mg, 650 mg, Per NG tube, Q4H PRN,  Tukov-Yual, Magdalene S, NP   aspirin chewable tablet 81 mg, 81 mg, Per NG tube, Daily, Tukov-Yual, Magdalene S, NP, 81 mg at 11/17/18 0811   bisacodyl (DULCOLAX) suppository 10 mg, 10 mg, Rectal, Daily PRN, Tukov-Yual, Magdalene S, NP   chlorhexidine gluconate (MEDLINE KIT) (PERIDEX) 0.12 % solution 15 mL, 15 mL, Mouth Rinse, BID, Tukov-Yual, Magdalene S, NP, 15 mL at 11/17/18 0817   Chlorhexidine Gluconate Cloth 2 % PADS 6 each, 6 each, Topical, Daily, Tukov-Yual, Magdalene S, NP   Chlorhexidine Gluconate Cloth 2 % PADS 6 each, 6 each, Topical, Daily, Tukov-Yual, Magdalene S, NP   colchicine tablet 1.2 mg, 1.2 mg, Per NG tube, Daily PRN, Tukov-Yual, Magdalene S, NP   dexmedetomidine (PRECEDEX) 400 MCG/100ML (4 mcg/mL) infusion, 0-1.2 mcg/kg/hr, Intravenous, Continuous, Tukov-Yual, Magdalene S, NP, Last Rate: 51.9 mL/hr at 11/17/18 0700, 1.2 mcg/kg/hr at 11/17/18 0700   diltiazem (CARDIZEM) tablet 60 mg, 60 mg, Per NG tube, Q6H, Tukov-Yual, Magdalene S, NP, 60 mg at 11/17/18 0637   etomidate (AMIDATE) injection 40 mg, 40 mg, Intravenous, Once, Tukov-Yual, Magdalene S, NP   fentaNYL (SUBLIMAZE) bolus via infusion 50 mcg, 50 mcg, Intravenous, Q15 min PRN, Tukov-Yual, Magdalene S, NP   fentaNYL (SUBLIMAZE) injection 200 mcg, 200 mcg, Intravenous, Once, Tukov-Yual, Magdalene S, NP   fentaNYL 2562mg in NS 2527m(1025mml) infusion-PREMIX, 0-400 mcg/hr, Intravenous, Continuous, Tukov-Yual, Magdalene S, NP, Last Rate: 40 mL/hr at 11/17/18 0700, 400 mcg/hr at 11/17/18 0700   furosemide (LASIX) injection 40 mg, 40 mg, Intravenous, Q12H, Seals, Angela H, NP, 40 mg at 11/17/18  0636   insulin aspart (novoLOG) injection 0-15 Units, 0-15 Units, Subcutaneous, Q4H, Tukov-Yual, Magdalene S, NP, 5 Units at 11/17/18 0820   ipratropium-albuterol (DUONEB) 0.5-2.5 (3) MG/3ML nebulizer solution 3 mL, 3 mL, Nebulization, Q4H, Tukov-Yual, Magdalene S, NP, 3 mL at 11/17/18 0726   lisinopril (ZESTRIL) tablet  5 mg, 5 mg, Per NG tube, Daily, Tukov-Yual, Magdalene S, NP, 5 mg at 11/17/18 6295   MEDLINE mouth rinse, 15 mL, Mouth Rinse, 10 times per day, Tukov-Yual, Magdalene S, NP, 15 mL at 11/17/18 2841   methylPREDNISolone sodium succinate (SOLU-MEDROL) 125 mg/2 mL injection 60 mg, 60 mg, Intravenous, Q12H, Tukov-Yual, Magdalene S, NP, 60 mg at 11/17/18 0036   metoprolol tartrate (LOPRESSOR) tablet 50 mg, 50 mg, Per NG tube, BID, Tukov-Yual, Magdalene S, NP, 50 mg at 11/17/18 0810   midazolam (VERSED) injection 2-4 mg, 2-4 mg, Intravenous, Q15 min PRN, Tukov-Yual, Magdalene S, NP, 4 mg at 11/17/18 0123   midazolam (VERSED) injection 2-4 mg, 2-4 mg, Intravenous, Q2H PRN, Tukov-Yual, Magdalene S, NP, 2 mg at 11/17/18 0600   midazolam (VERSED) injection 4 mg, 4 mg, Intravenous, Once, Tukov-Yual, Magdalene S, NP   norepinephrine (LEVOPHED) 4 mg in dextrose 5 % 250 mL (0.016 mg/mL) infusion, 0-40 mcg/min, Intravenous, Titrated, Tukov-Yual, Magdalene S, NP, Stopped at 11/17/18 0301   ondansetron (ZOFRAN) injection 4 mg, 4 mg, Intravenous, Q6H PRN, Seals, Angela H, NP   pantoprazole (PROTONIX) injection 40 mg, 40 mg, Intravenous, Daily, Tukov-Yual, Magdalene S, NP, 40 mg at 11/17/18 0804   pravastatin (PRAVACHOL) tablet 80 mg, 80 mg, Per NG tube, QPM, Tukov-Yual, Magdalene S, NP, 80 mg at 11/17/18 0051   sennosides (SENOKOT) 8.8 MG/5ML syrup 5 mL, 5 mL, Per Tube, BID PRN, Tukov-Yual, Magdalene S, NP   sodium chloride flush (NS) 0.9 % injection 10-40 mL, 10-40 mL, Intracatheter, PRN, Demetrios Loll, MD   sodium chloride flush (NS) 0.9 % injection 3 mL, 3 mL, Intravenous, Q12H, Seals, Angela H, NP, 3 mL at 11/17/18 0041   sodium chloride flush (NS) 0.9 % injection 3 mL, 3 mL, Intravenous, PRN, Seals, Angela H, NP, 3 mL at 11/17/18 0040   vecuronium (NORCURON) 10 MG injection, , , ,    vecuronium (NORCURON) injection 10 mg, 10 mg, Intravenous, Q2H PRN, Tukov-Yual, Magdalene S, NP, 10 mg at 11/17/18  0611   [START ON 11/19/2018] warfarin (COUMADIN) tablet 2.5 mg, 2.5 mg, Per NG tube, Once per day on Mon Fri, Tukov-Yual, Magdalene S, NP   warfarin (COUMADIN) tablet 4 mg, 4 mg, Per NG tube, Once per day on Sun Tue Wed Thu Sat, Tukov-Yual, Magdalene S, NP   Warfarin - Physician Dosing Inpatient, , Does not apply, q1800, Demetrios Loll, MD    ALLERGIES   Patient has no known allergies.    REVIEW OF SYSTEMS     Unable to obtain due to MV and sedation  PHYSICAL EXAMINATION   Vital Signs: Temp:  [96.6 F (35.9 C)-98 F (36.7 C)] 96.6 F (35.9 C) (08/29 0800) Pulse Rate:  [25-156] 80 (08/29 0804) Resp:  [11-28] 25 (08/29 0740) BP: (79-179)/(53-138) 152/103 (08/29 0804) SpO2:  [40 %-100 %] 97 % (08/29 0727) FiO2 (%):  [45 %-100 %] 45 % (08/29 0800) Weight:  [158 kg-173.2 kg] 173.2 kg (08/29 0420)  GENERAL:GCS4T on MV RASS-1 HEAD: Normocephalic, atraumatic.  EYES: Pupils equal, round, reactive to light.  No scleral icterus.  MOUTH: Moist mucosal membrane. NECK: Supple. No thyromegaly. No nodules. No JVD.  PULMONARY bilateral crackles CARDIOVASCULAR:  S1 and S2. Regular rate and rhythm. No murmurs, rubs, or gallops.  GASTROINTESTINAL: Soft, nontender, non-distended. No masses. Positive bowel sounds. No hepatosplenomegaly.  MUSCULOSKELETAL: No swelling, clubbing, or edema.  NEUROLOGIC: Mild distress due to acute illness SKIN:intact,warm,dry   PERTINENT DATA     Infusions:  sodium chloride     dexmedetomidine (PRECEDEX) IV infusion 1.2 mcg/kg/hr (11/17/18 0700)   fentaNYL infusion INTRAVENOUS 400 mcg/hr (11/17/18 0700)   norepinephrine (LEVOPHED) Adult infusion Stopped (11/17/18 0301)   Scheduled Medications:  aspirin  81 mg Per NG tube Daily   chlorhexidine gluconate (MEDLINE KIT)  15 mL Mouth Rinse BID   Chlorhexidine Gluconate Cloth  6 each Topical Daily   Chlorhexidine Gluconate Cloth  6 each Topical Daily   diltiazem  60 mg Per NG tube Q6H   etomidate   40 mg Intravenous Once   fentaNYL (SUBLIMAZE) injection  200 mcg Intravenous Once   furosemide  40 mg Intravenous Q12H   insulin aspart  0-15 Units Subcutaneous Q4H   ipratropium-albuterol  3 mL Nebulization Q4H   lisinopril  5 mg Per NG tube Daily   mouth rinse  15 mL Mouth Rinse 10 times per day   methylPREDNISolone (SOLU-MEDROL) injection  60 mg Intravenous Q12H   metoprolol tartrate  50 mg Per NG tube BID   midazolam  4 mg Intravenous Once   pantoprazole (PROTONIX) IV  40 mg Intravenous Daily   pravastatin  80 mg Per NG tube QPM   sodium chloride flush  3 mL Intravenous Q12H   vecuronium       [START ON 11/19/2018] warfarin  2.5 mg Per NG tube Once per day on Mon Fri   warfarin  4 mg Per NG tube Once per day on Sun Tue Wed Thu Sat   Warfarin - Physician Dosing Inpatient   Does not apply q1800   PRN Medications: sodium chloride, acetaminophen, bisacodyl, colchicine, fentaNYL, midazolam, midazolam, ondansetron (ZOFRAN) IV, sennosides, sodium chloride flush, sodium chloride flush, vecuronium Hemodynamic parameters:   Intake/Output: 08/28 0701 - 08/29 0700 In: 582.5 [I.V.:582.5] Out: 500 [Urine:300; Emesis/NG output:200]  Ventilator  Settings: Vent Mode: PRVC FiO2 (%):  [45 %-100 %] 45 % Set Rate:  [25 bmp] 25 bmp Vt Set:  [500 mL] 500 mL PEEP:  [8 cmH20] 8 cmH20 Plateau Pressure:  [26 cmH20-29 cmH20] 29 cmH20   Other Labs:   LAB RESULTS:  Basic Metabolic Panel: Recent Labs  Lab 11/19/2018 1655 11/17/18 0029 11/17/18 0423  NA 135 136 135  K 4.8 4.8 5.1  CL 92* 94* 94*  CO2 34* 31 30  GLUCOSE 201* 172* 222*  BUN 34* 34* 35*  CREATININE 1.31* 1.28* 1.35*  CALCIUM 8.8* 8.7* 8.7*  MG  --  2.6*  --   PHOS  --  5.0*  --    Liver Function Tests: Recent Labs  Lab 11/06/2018 1655 11/17/18 0029  AST 23 20  ALT 29 25  ALKPHOS 67 64  BILITOT 0.5 1.0  PROT 6.9 6.6  ALBUMIN 3.6 3.5   No results for input(s): LIPASE, AMYLASE in the last 168 hours. No  results for input(s): AMMONIA in the last 168 hours. CBC: Recent Labs  Lab 10/27/2018 1655 11/17/18 0029  WBC 7.5 7.7  NEUTROABS 5.7 5.7  HGB 13.6 13.6  HCT 44.2 43.1  MCV 98.0 96.4  PLT 192 166   Cardiac Enzymes: No results for input(s): CKTOTAL, CKMB, CKMBINDEX, TROPONINI in the last 168 hours. BNP: Invalid input(s): POCBNP CBG: Recent  Labs  Lab 10/21/2018 2145 11/17/18 0027 11/17/18 0414 11/17/18 0754  GLUCAP 138* 157* 245* 235*     IMAGING RESULTS:  Imaging: Dg Abd 1 View  Result Date: 11/17/2018 CLINICAL DATA:  Abdomen distension tube placement EXAM: ABDOMEN - 1 VIEW COMPARISON:  10/24/2018 FINDINGS: Esophageal tube tip visible to the distal stomach but incompletely included. Airspace disease at the right base. IMPRESSION: Esophageal tubing visible to the gastric body but tip is incompletely included in the field of view. Electronically Signed   By: Donavan Foil M.D.   On: 11/17/2018 00:10   Dg Abd 1 View  Result Date: 10/31/2018 CLINICAL DATA:  Abdominal distention EXAM: ABDOMEN - 1 VIEW COMPARISON:  None. FINDINGS: Limited study due to portable nature of the study and body habitus. I see no significant bowel dilatation or free air. Study otherwise limited. IMPRESSION: Very limited study. No visible changes of bowel obstruction or free air. Electronically Signed   By: Rolm Baptise M.D.   On: 11/13/2018 22:33   Dg Chest Port 1 View  Result Date: 11/17/2018 CLINICAL DATA:  Intubation EXAM: PORTABLE CHEST 1 VIEW COMPARISON:  11/17/2018, 10/25/2018 FINDINGS: Endotracheal tube tip is about 3.1 cm superior to the carina. Esophageal tube tip extends below the diaphragm but is non included. Bilateral pleural effusions, likely layering on the right. Cardiomegaly with vascular congestion and bilateral pulmonary edema. Basilar consolidations. No pneumothorax. IMPRESSION: 1. Endotracheal tube tip about 3.1 cm superior to carina 2. Cardiomegaly with vascular congestion, pulmonary edema  and bilateral pleural effusion Electronically Signed   By: Donavan Foil M.D.   On: 11/17/2018 00:12   Dg Chest Port 1 View  Result Date: 11/01/2018 CLINICAL DATA:  Shortness of breath for 2 weeks. EXAM: PORTABLE CHEST 1 VIEW COMPARISON:  11/04/2010 radiograph FINDINGS: Cardiomegaly with pulmonary vascular congestion noted. Possible mild interstitial opacities/edema noted. No pneumothorax or acute bony abnormality. Bibasilar atelectasis noted. There may be trace pleural effusions present. IMPRESSION: Cardiomegaly with pulmonary vascular congestion and possible mild interstitial edema. Bibasilar atelectasis.  Possible trace pleural effusions. Electronically Signed   By: Margarette Canada M.D.   On: 11/15/2018 17:13      ASSESSMENT AND PLAN    -Multidisciplinary rounds held today  Acute Hypoxic Respiratory Failure - likely due to acute decompensated CHF with OHS/OSA and COPD as well pneumonia  -continue Full MV support -continue Bronchodilator Therapy -Wean Fio2 and PEEP as tolerated -will perform SAT/SBT when respiratory parameters are met   Acute decompensated diastolic CHF with EF >81% - hx of mitral valve repair - repeat TTE - Lasix IV 40 BID -I&O strict via Foley -oxygen as needed -follow up cardiac enzymes as indicated ICU monitoring   Abdominal distension   - concern for poss perforation    - KUB with poor visualization due to body habitus and portable image    - CT abdomen with no acute intraabdominal dz  Renal Failure-acute on chronic due to cardiorenal syndrome with diabetic nephropathy -monitor for worsening GFR while diuresing -follow chem 7 -follow UO -continue Foley Catheter-assess need daily   NEUROLOGY - intubated and sedated - minimal sedation to achieve a RASS goal: -1 Wake up assessment pending   Electrolyte derrangements  mag elevated Phos elevated pottasium repletion -pharmacy consultation for electrolytes   ID -continue IV abx as  prescibed -follow up cultures  GI/Nutrition GI PROPHYLAXIS as indicated DIET-->TF's as tolerated Constipation protocol as indicated  ENDO - ICU hypoglycemic\Hyperglycemia protocol -check FSBS per protocol   ELECTROLYTES -follow labs  as needed -replace as needed -pharmacy consultation   DVT/GI PRX ordered -SCDs  TRANSFUSIONS AS NEEDED MONITOR FSBS ASSESS the need for LABS as needed   Critical care provider statement:    Critical care time (minutes):  109   Critical care time was exclusive of:  Separately billable procedures and treating other patients   Critical care was necessary to treat or prevent imminent or life-threatening deterioration of the following conditions:  acute hypoxemic respiratory failure, acute decompensated HFpEF, abd distension, dm, copd, osa, dm, multiple comorbid conditions   Critical care was time spent personally by me on the following activities:  Development of treatment plan with patient or surrogate, discussions with consultants, evaluation of patient's response to treatment, examination of patient, obtaining history from patient or surrogate, ordering and performing treatments and interventions, ordering and review of laboratory studies and re-evaluation of patient's condition.  I assumed direction of critical care for this patient from another provider in my specialty: no    This document was prepared using Dragon voice recognition software and may include unintentional dictation errors.    Ottie Glazier, M.D.  Division of [place]

## 2018-11-17 NOTE — Progress Notes (Signed)
[facility] [place] NAME: Kyle White    MR#:  [id number]  DATE OF BIRTH:  21-Mar-1961  SUBJECTIVE:  CHIEF COMPLAINT:   Chief Complaint  Patient presents with   Shortness of Breath   Weakness  sedated on vent REVIEW OF SYSTEMS:  ROS: unable to obtain as on vent DRUG ALLERGIES:  No Known Allergies VITALS:  Blood pressure (!) 121/93, pulse 92, temperature (!) 97.1 F (36.2 C), temperature source Axillary, resp. rate 20, height 5\' 9"  (1.753 m), weight (!) 173.2 kg, SpO2 95 %. PHYSICAL EXAMINATION:  Physical Exam Constitutional:      General: He is sleeping.  HENT:     Head: Normocephalic and atraumatic.     Comments: ETT in place Eyes:     Conjunctiva/sclera: Conjunctivae normal.     Pupils: Pupils are equal, round, and reactive to light.  Neck:     Musculoskeletal: Normal range of motion and neck supple.     Thyroid: No thyromegaly.     Trachea: No tracheal deviation.  Cardiovascular:     Rate and Rhythm: Normal rate and regular rhythm.     Heart sounds: Normal heart sounds.  Pulmonary:     Effort: Pulmonary effort is normal. No respiratory distress.     Breath sounds: Normal breath sounds. No wheezing.  Chest:     Chest wall: No tenderness.  Abdominal:     General: Bowel sounds are normal. There is no distension.     Palpations: Abdomen is soft.     Tenderness: There is no abdominal tenderness.  Musculoskeletal: Normal range of motion.  Skin:    General: Skin is warm and dry.     Findings: No rash.  Neurological:     Cranial Nerves: No cranial nerve deficit.     Comments: Sedated on vent  Psychiatric:     Comments: Sedated on vent    LABORATORY PANEL:  Male CBC Recent Labs  Lab 11/17/18 0029  WBC 7.7  HGB 13.6  HCT 43.1  PLT 166   ------------------------------------------------------------------------------------------------------------------ Chemistries  Recent Labs  Lab 11/17/18 0029  11/17/18 0423  NA 136 135  K 4.8 5.1  CL 94* 94*  CO2 31 30  GLUCOSE 172* 222*  BUN 34* 35*  CREATININE 1.28* 1.35*  CALCIUM 8.7* 8.7*  MG 2.6*  --   AST 20  --   ALT 25  --   ALKPHOS 64  --   BILITOT 1.0  --    RADIOLOGY:  Ct Abdomen Pelvis Wo Contrast  Result Date: 11/17/2018 CLINICAL DATA:  Abdominal rigidity. Concern for perforated viscus. Currently intubated. EXAM: CT ABDOMEN AND PELVIS WITHOUT CONTRAST TECHNIQUE: Multidetector CT imaging of the abdomen and pelvis was performed following the standard protocol without IV contrast. COMPARISON:  None. FINDINGS: Lower chest: Small right and trace left pleural effusions with bilateral lower lobe airspace disease. Small amount of herniated right middle lobe between the fifth and sixth ribs. Hepatobiliary: Severe hepatic steatosis. Several small gallstones. No gallbladder wall thickening or biliary dilatation. Pancreas: Unremarkable. No pancreatic ductal dilatation or surrounding inflammatory changes. Spleen: Normal in size without focal abnormality. Adrenals/Urinary Tract: Adrenal glands are unremarkable. Punctate calculus in the lower pole the left kidney. No hydronephrosis. Bladder is decompressed by Foley catheter. Stomach/Bowel: Enteric tube tip in the distal stomach. Stomach is within normal limits. Appendix appears normal. No evidence of bowel wall thickening, distention, or inflammatory changes. Vascular/Lymphatic: Aortic atherosclerosis. No enlarged abdominal or pelvic  lymph nodes. Reproductive: Prostate is unremarkable. Other: Trace ascites.  No pneumoperitoneum. Musculoskeletal: No acute or significant osseous findings. IMPRESSION: 1.  No acute intra-abdominal process.  No perforation. 2. Trace ascites. 3. Severe hepatic steatosis. 4. Cholelithiasis. 5. Punctate nonobstructive left nephrolithiasis. 6. Small right and trace left pleural effusions. Bilateral lower lobe airspace disease may represent atelectasis, but pneumonia is difficult  to exclude, particularly on the right. Electronically Signed   By: Titus Dubin M.D.   On: 11/17/2018 13:50   Dg Abd 1 View  Result Date: 11/17/2018 CLINICAL DATA:  Abdomen distension tube placement EXAM: ABDOMEN - 1 VIEW COMPARISON:  11/15/2018 FINDINGS: Esophageal tube tip visible to the distal stomach but incompletely included. Airspace disease at the right base. IMPRESSION: Esophageal tubing visible to the gastric body but tip is incompletely included in the field of view. Electronically Signed   By: Donavan Foil M.D.   On: 11/17/2018 00:10   Dg Abd 1 View  Result Date: 11/08/2018 CLINICAL DATA:  Abdominal distention EXAM: ABDOMEN - 1 VIEW COMPARISON:  None. FINDINGS: Limited study due to portable nature of the study and body habitus. I see no significant bowel dilatation or free air. Study otherwise limited. IMPRESSION: Very limited study. No visible changes of bowel obstruction or free air. Electronically Signed   By: Rolm Baptise M.D.   On: 10/24/2018 22:33   Dg Chest Port 1 View  Result Date: 11/17/2018 CLINICAL DATA:  Intubation EXAM: PORTABLE CHEST 1 VIEW COMPARISON:  11/17/2018, 11/15/2018 FINDINGS: Endotracheal tube tip is about 3.1 cm superior to the carina. Esophageal tube tip extends below the diaphragm but is non included. Bilateral pleural effusions, likely layering on the right. Cardiomegaly with vascular congestion and bilateral pulmonary edema. Basilar consolidations. No pneumothorax. IMPRESSION: 1. Endotracheal tube tip about 3.1 cm superior to carina 2. Cardiomegaly with vascular congestion, pulmonary edema and bilateral pleural effusion Electronically Signed   By: Donavan Foil M.D.   On: 11/17/2018 00:12   Dg Chest Port 1 View  Result Date: 10/23/2018 CLINICAL DATA:  Shortness of breath for 2 weeks. EXAM: PORTABLE CHEST 1 VIEW COMPARISON:  11/04/2010 radiograph FINDINGS: Cardiomegaly with pulmonary vascular congestion noted. Possible mild interstitial opacities/edema  noted. No pneumothorax or acute bony abnormality. Bibasilar atelectasis noted. There may be trace pleural effusions present. IMPRESSION: Cardiomegaly with pulmonary vascular congestion and possible mild interstitial edema. Bibasilar atelectasis.  Possible trace pleural effusions. Electronically Signed   By: Margarette Canada M.D.   On: 11/06/2018 17:13   ASSESSMENT AND PLAN:   1.  Acute on chronic hypoxic respiratory failure - vent mgmt per PCCM team  2.  Obstructive sleep apnea  3.  Acute on chronic CHF, EF 55 to 60% on echo in 2014 at [facility] - Repeat echocardiogram pending - Lasix 40 mg IV twice daily - Telemetry monitoring - Cardiology consulted for further evaluation and management recommendations -Continue to trend troponin levels  4.  History of atrial fibrillation - Continue Coumadin - monitor INR in the a.m. -Telemetry monitoring  5.  Acute renal failure with creatinine 1.35 (1.28) - Repeat BMP in the a.m. and continue to monitor renal function closely  6.  Diabetes mellitus - Sliding scale insulin -Hemoglobin A1c  7.  Generalized weakness -Physical therapy eval once extubated  PPI prophylaxis     All the records are reviewed and case discussed with Care Management/Social Worker. Management plans discussed with the patient, nursing and they are in agreement.  CODE STATUS: Full Code  TOTAL TIME  TAKING CARE OF THIS PATIENT: 15 minutes.   More than 50% of the time was spent in counseling/coordination of care: YES  POSSIBLE D/C IN 3-4 DAYS, DEPENDING ON CLINICAL CONDITION.   Max Sane M.D on 11/17/2018 at 3:07 PM  Between 7am to 6pm - Pager - [phone]  After 6pm go to [email or web address] - Technical brewer [facility]  Office  [phone]  CC: Primary care physician; [employer]  Note: This dictation was prepared with Diplomatic Services operational officer dictation along with smaller Company secretary. Any transcriptional errors that result  from this process are unintentional.

## 2018-11-18 LAB — GLUCOSE, CAPILLARY
Glucose-Capillary: 137 mg/dL — ABNORMAL HIGH (ref 70–99)
Glucose-Capillary: 160 mg/dL — ABNORMAL HIGH (ref 70–99)
Glucose-Capillary: 189 mg/dL — ABNORMAL HIGH (ref 70–99)
Glucose-Capillary: 191 mg/dL — ABNORMAL HIGH (ref 70–99)
Glucose-Capillary: 199 mg/dL — ABNORMAL HIGH (ref 70–99)
Glucose-Capillary: 207 mg/dL — ABNORMAL HIGH (ref 70–99)

## 2018-11-18 LAB — BASIC METABOLIC PANEL
Anion gap: 13 (ref 5–15)
BUN: 41 mg/dL — ABNORMAL HIGH (ref 6–20)
CO2: 29 mmol/L (ref 22–32)
Calcium: 8.7 mg/dL — ABNORMAL LOW (ref 8.9–10.3)
Chloride: 96 mmol/L — ABNORMAL LOW (ref 98–111)
Creatinine, Ser: 1.36 mg/dL — ABNORMAL HIGH (ref 0.61–1.24)
GFR calc Af Amer: >60 mL/min (ref >60)
GFR calc non Af Amer: 57 mL/min — ABNORMAL LOW (ref >60)
Glucose, Bld: 210 mg/dL — ABNORMAL HIGH (ref 70–99)
Potassium: 3.9 mmol/L (ref 3.5–5.1)
Sodium: 138 mmol/L (ref 135–145)

## 2018-11-18 LAB — PROTIME-INR
INR: 3.4 — ABNORMAL HIGH (ref 0.8–1.2)
INR: 3.6 — ABNORMAL HIGH (ref 0.8–1.2)
Prothrombin Time: 33.6 seconds — ABNORMAL HIGH (ref 11.4–15.2)
Prothrombin Time: 35.2 s — ABNORMAL HIGH (ref 11.4–15.2)

## 2018-11-18 LAB — URINE CULTURE: Culture: NO GROWTH

## 2018-11-18 LAB — PHOSPHORUS: Phosphorus: 3.8 mg/dL (ref 2.5–4.6)

## 2018-11-18 LAB — APTT: aPTT: 43 s — ABNORMAL HIGH (ref 24–36)

## 2018-11-18 LAB — CBC
HCT: 40.9 % (ref 39.0–52.0)
Hemoglobin: 13.3 g/dL (ref 13.0–17.0)
MCH: 30.2 pg (ref 26.0–34.0)
MCHC: 32.5 g/dL (ref 30.0–36.0)
MCV: 92.7 fL (ref 80.0–100.0)
Platelets: 155 10*3/uL (ref 150–400)
RBC: 4.41 MIL/uL (ref 4.22–5.81)
RDW: 13.1 % (ref 11.5–15.5)
WBC: 6.1 10*3/uL (ref 4.0–10.5)
nRBC: 0 % (ref 0.0–0.2)

## 2018-11-18 LAB — MAGNESIUM: Magnesium: 2.5 mg/dL — ABNORMAL HIGH (ref 1.7–2.4)

## 2018-11-18 MED ORDER — DILTIAZEM HCL 100 MG IV SOLR
5.0000 mg/h | INTRAVENOUS | Status: DC
  Administered 2018-11-18: 14:00:00 5 mg/h via INTRAVENOUS
  Administered 2018-11-18: 15 mg/h via INTRAVENOUS
  Administered 2018-11-19 – 2018-11-20 (×2): 10 mg/h via INTRAVENOUS
  Administered 2018-11-20: 7.5 mg/h via INTRAVENOUS
  Filled 2018-11-18 (×6): qty 100

## 2018-11-18 MED ORDER — METOPROLOL TARTRATE 5 MG/5ML IV SOLN
INTRAVENOUS | Status: AC
  Administered 2018-11-18: 5 mg via INTRAVENOUS
  Filled 2018-11-18: qty 5

## 2018-11-18 MED ORDER — DILTIAZEM HCL ER COATED BEADS 240 MG PO CP24
240.0000 mg | ORAL_CAPSULE | Freq: Every day | ORAL | Status: DC
  Administered 2018-11-21: 08:00:00 240 mg via ORAL
  Filled 2018-11-18 (×6): qty 1

## 2018-11-18 MED ORDER — POTASSIUM CHLORIDE 20 MEQ PO PACK: 40.0000 meq | PACK | Freq: Once | ORAL | Status: DC

## 2018-11-18 MED ORDER — LORAZEPAM 2 MG/ML IJ SOLN
INTRAMUSCULAR | Status: AC
  Administered 2018-11-18: 1 mg via INTRAVENOUS
  Filled 2018-11-18: qty 1

## 2018-11-18 MED ORDER — AMIODARONE IV BOLUS ONLY 150 MG/100ML
150.0000 mg | Freq: Once | INTRAVENOUS | Status: AC
  Administered 2018-11-18: 13:00:00 150 mg via INTRAVENOUS

## 2018-11-18 MED ORDER — PANTOPRAZOLE SODIUM 40 MG PO PACK
40.0000 mg | PACK | Freq: Every day | ORAL | Status: DC
  Administered 2018-11-19 – 2018-12-11 (×23): 40 mg
  Filled 2018-11-18 (×24): qty 20

## 2018-11-18 MED ORDER — ORAL CARE MOUTH RINSE
15.0000 mL | Freq: Two times a day (BID) | OROMUCOSAL | Status: DC
  Administered 2018-11-19: 15 mL via OROMUCOSAL

## 2018-11-18 MED ORDER — DILTIAZEM HCL 100 MG IV SOLR
INTRAVENOUS | Status: AC
  Administered 2018-11-18: 5 mg/h via INTRAVENOUS
  Filled 2018-11-18: qty 100

## 2018-11-18 MED ORDER — DEXMEDETOMIDINE HCL IN NACL 400 MCG/100ML IV SOLN
0.4000 ug/kg/h | INTRAVENOUS | Status: DC
  Administered 2018-11-18 (×2): 1.2 ug/kg/h via INTRAVENOUS
  Administered 2018-11-18: 1 ug/kg/h via INTRAVENOUS
  Administered 2018-11-19 (×7): 1.2 ug/kg/h via INTRAVENOUS
  Filled 2018-11-18 (×12): qty 100

## 2018-11-18 MED ORDER — METOPROLOL TARTRATE 5 MG/5ML IV SOLN
5.0000 mg | Freq: Once | INTRAVENOUS | Status: AC
  Administered 2018-11-18: 14:00:00 5 mg via INTRAVENOUS

## 2018-11-18 MED ORDER — LORAZEPAM 2 MG/ML IJ SOLN
1.0000 mg | Freq: Once | INTRAMUSCULAR | Status: AC
  Administered 2018-11-18: 17:00:00 1 mg via INTRAVENOUS

## 2018-11-18 MED ORDER — ALLOPURINOL 100 MG PO TABS
100.0000 mg | ORAL_TABLET | Freq: Every day | ORAL | Status: DC
  Administered 2018-11-20: 100 mg via ORAL
  Filled 2018-11-18 (×3): qty 1

## 2018-11-18 MED ORDER — METOPROLOL TARTRATE 5 MG/5ML IV SOLN
5.0000 mg | Freq: Once | INTRAVENOUS | Status: AC
  Administered 2018-11-18: 13:00:00 5 mg via INTRAVENOUS

## 2018-11-18 MED ORDER — NOREPINEPHRINE 4 MG/250ML-% IV SOLN
INTRAVENOUS | Status: AC
  Filled 2018-11-18: qty 250

## 2018-11-18 MED ORDER — AMIODARONE IV BOLUS ONLY 150 MG/100ML
INTRAVENOUS | Status: AC
  Administered 2018-11-18: 150 mg via INTRAVENOUS
  Filled 2018-11-18: qty 100

## 2018-11-18 MED ORDER — CHLORHEXIDINE GLUCONATE 0.12 % MT SOLN
15.0000 mL | Freq: Two times a day (BID) | OROMUCOSAL | Status: DC
  Administered 2018-11-19: 15 mL via OROMUCOSAL

## 2018-11-18 MED ORDER — LORAZEPAM 2 MG/ML IJ SOLN
1.0000 mg | Freq: Once | INTRAMUSCULAR | Status: AC
  Administered 2018-11-18: 1 mg via INTRAVENOUS

## 2018-11-18 MED ORDER — WARFARIN - PHARMACIST DOSING INPATIENT: Freq: Every day | Status: DC

## 2018-11-18 MED ORDER — METHYLPREDNISOLONE SODIUM SUCC 125 MG IJ SOLR
60.0000 mg | Freq: Two times a day (BID) | INTRAMUSCULAR | Status: DC
  Administered 2018-11-18 – 2018-11-19 (×2): 60 mg via INTRAVENOUS
  Filled 2018-11-18 (×2): qty 2

## 2018-11-18 NOTE — Progress Notes (Signed)
CRITICAL CARE PROGRESS NOTE    Name: Kyle White MRN: [id number] DOB: 07/03/1960     LOS: 2   SUBJECTIVE FINDINGS & SIGNIFICANT EVENTS   Patient description:  58 yo w/hx of morbid obesity, HFpEF hx MVR, CHF, PAF, OSA, DM, AHRF came in with acute hypoxemic hypercarbic resp failure with CXR showing b/l pl effusions and pulm edema.  CT with restrictive physiology,  pulm edema, bilateral pleural effusions and compressive atelectasis   Lines / Drains: Midline   Cultures / Sepsis markers: Blood and resp cx  Antibiotics: none   Protocols / Consultants: Cardiology   Tests / Events: TTE  Overnight:  Liberated from MV onto BIPAP which patient uses at home every day.   PAST MEDICAL HISTORY   Past Medical History:  Diagnosis Date  . Atrial fibrillation ([place])   . CHF (congestive heart failure) ([place])   . Diabetes mellitus without complication ([place])   . Gout   . Hypertension associated with type 2 diabetes mellitus ([place])   . Morbid obesity ([place])   . Obstructive sleep apnea      SURGICAL HISTORY   Past Surgical History:  Procedure Laterality Date  . MITRAL VALVE REPAIR       FAMILY HISTORY   History reviewed. No pertinent family history.   SOCIAL HISTORY   Social History   Tobacco Use  . Smoking status: Never Smoker  . Smokeless tobacco: Never Used  Substance Use Topics  . Alcohol use: Never    Frequency: Never  . Drug use: Never     MEDICATIONS   Current Medication:  Current Facility-Administered Medications:  .  acetaminophen (TYLENOL) tablet 650 mg, 650 mg, Per NG tube, Q4H PRN, Tukov-Yual, Magdalene S, NP .  aspirin chewable tablet 81 mg, 81 mg, Per NG tube, Daily, Tukov-Yual, Magdalene S, NP, 81 mg at 11/17/18 0811 .  bisacodyl (DULCOLAX) suppository 10 mg, 10 mg,  Rectal, Daily PRN, Tukov-Yual, Magdalene S, NP .  chlorhexidine gluconate (MEDLINE KIT) (PERIDEX) 0.12 % solution 15 mL, 15 mL, Mouth Rinse, BID, Tukov-Yual, Magdalene S, NP, 15 mL at 11/17/18 2000 .  Chlorhexidine Gluconate Cloth 2 % PADS 6 each, 6 each, Topical, Daily, Tukov-Yual, Magdalene S, NP, 6 each at 11/17/18 1810 .  colchicine tablet 1.2 mg, 1.2 mg, Per NG tube, Daily PRN, Tukov-Yual, Magdalene S, NP .  diltiazem (CARDIZEM) tablet 60 mg, 60 mg, Per NG tube, Q6H, Tukov-Yual, Magdalene S, NP, 60 mg at 11/18/18 0558 .  fentaNYL (SUBLIMAZE) bolus via infusion 50 mcg, 50 mcg, Intravenous, Q15 min PRN, Tukov-Yual, Magdalene S, NP .  fentaNYL 2511mg in NS 2545m(1083mml) infusion-PREMIX, 0-400 mcg/hr, Intravenous, Continuous, Tukov-Yual, Magdalene S, NP, Last Rate: 40 mL/hr at 11/18/18 0600, 400 mcg/hr at 11/18/18 0600 .  furosemide (LASIX) injection 60 mg, 60 mg, Intravenous, Q12H, AleLanney Ginsuad, MD, 60 mg at 11/17/18 2000 .  insulin aspart (novoLOG) injection 0-15 Units, 0-15 Units, Subcutaneous, Q4H, Tukov-Yual, Magdalene S, NP, 3 Units at 11/18/18 0425 .  ipratropium-albuterol (DUONEB) 0.5-2.5 (3) MG/3ML nebulizer solution 3 mL, 3 mL, Nebulization, Q4H, Tukov-Yual, Magdalene S, NP, 3 mL at 11/18/18 0753 .  lisinopril (ZESTRIL) tablet 5 mg, 5 mg, Per NG tube, Daily, Tukov-Yual, Magdalene S, NP, 5 mg at 11/17/18 0811 .  MEDLINE mouth rinse, 15 mL, Mouth Rinse, 10 times per day, Tukov-Yual, Magdalene S, NP, 15 mL at 11/18/18 0611 .  methylPREDNISolone sodium succinate (SOLU-MEDROL) 125 mg/2 mL injection 60 mg, 60 mg, Intravenous, Q12H, Tukov-Yual, MagArlyss GandyP,  60 mg at 11/17/18 2324 .  metoprolol tartrate (LOPRESSOR) tablet 50 mg, 50 mg, Per NG tube, BID, Tukov-Yual, Magdalene S, NP, 50 mg at 11/17/18 2257 .  midazolam (VERSED) injection 2-4 mg, 2-4 mg, Intravenous, Q2H PRN, Tukov-Yual, Magdalene S, NP, 4 mg at 11/18/18 0627 .  midazolam (VERSED) injection 4 mg, 4 mg, Intravenous, Once,  Tukov-Yual, Magdalene S, NP .  norepinephrine (LEVOPHED) 4 mg in dextrose 5 % 250 mL (0.016 mg/mL) infusion, 0-40 mcg/min, Intravenous, Titrated, Tukov-Yual, Magdalene S, NP, Stopped at 11/17/18 0301 .  ondansetron (ZOFRAN) injection 4 mg, 4 mg, Intravenous, Q6H PRN, Seals, Angela H, NP .  pantoprazole (PROTONIX) injection 40 mg, 40 mg, Intravenous, Daily, Tukov-Yual, Magdalene S, NP, 40 mg at 11/17/18 0804 .  pravastatin (PRAVACHOL) tablet 80 mg, 80 mg, Per NG tube, QPM, Tukov-Yual, Magdalene S, NP, 80 mg at 11/17/18 1632 .  propofol (DIPRIVAN) 1000 MG/100ML infusion, 5-80 mcg/kg/min, Intravenous, Titrated, Anshu Wehner, MD, Last Rate: 26 mL/hr at 11/18/18 0600, 25 mcg/kg/min at 11/18/18 0600 .  sennosides (SENOKOT) 8.8 MG/5ML syrup 5 mL, 5 mL, Per Tube, BID PRN, Tukov-Yual, Magdalene S, NP .  sodium chloride flush (NS) 0.9 % injection 10-40 mL, 10-40 mL, Intracatheter, PRN, Demetrios Loll, MD .  vecuronium (NORCURON) injection 10 mg, 10 mg, Intravenous, Q2H PRN, Tukov-Yual, Magdalene S, NP, 10 mg at 11/17/18 0611 .  [START ON 11/19/2018] warfarin (COUMADIN) tablet 2.5 mg, 2.5 mg, Per NG tube, Once per day on Mon Fri, Tukov-Yual, Magdalene S, NP .  warfarin (COUMADIN) tablet 4 mg, 4 mg, Per NG tube, Once per day on Sun Tue Wed Thu Sat, Tukov-Yual, Magdalene S, NP, 4 mg at 11/17/18 2048 .  Warfarin - Physician Dosing Inpatient, , Does not apply, q1800, Demetrios Loll, MD    ALLERGIES   Patient has no known allergies.    REVIEW OF SYSTEMS     Unable to obtain due to MV and sedation  PHYSICAL EXAMINATION   Vital Signs: Temp:  [97.1 F (36.2 C)-97.8 F (36.6 C)] 97.6 F (36.4 C) (08/30 0400) Pulse Rate:  [35-124] 40 (08/30 0600) Resp:  [5-25] 20 (08/30 0600) BP: (73-154)/(53-103) 92/60 (08/30 0600) SpO2:  [83 %-98 %] 95 % (08/30 0753) FiO2 (%):  [40 %-50 %] 40 % (08/30 0753) Weight:  [172.7 kg] 172.7 kg (08/30 0500)  GENERAL:GCS4T on MV RASS-1 HEAD: Normocephalic, atraumatic.  EYES:  Pupils equal, round, reactive to light.  No scleral icterus.  MOUTH: Moist mucosal membrane. NECK: Supple. No thyromegaly. No nodules. No JVD.  PULMONARY bilateral crackles CARDIOVASCULAR: S1 and S2. Regular rate and rhythm. No murmurs, rubs, or gallops.  GASTROINTESTINAL: Soft, nontender, non-distended. No masses. Positive bowel sounds. No hepatosplenomegaly.  MUSCULOSKELETAL: No swelling, clubbing, or edema.  NEUROLOGIC: Mild distress due to acute illness SKIN:intact,warm,dry   PERTINENT DATA     Infusions: . fentaNYL infusion INTRAVENOUS 400 mcg/hr (11/18/18 0600)  . norepinephrine (LEVOPHED) Adult infusion Stopped (11/17/18 0301)  . propofol (DIPRIVAN) infusion 25 mcg/kg/min (11/18/18 0600)   Scheduled Medications: . aspirin  81 mg Per NG tube Daily  . chlorhexidine gluconate (MEDLINE KIT)  15 mL Mouth Rinse BID  . Chlorhexidine Gluconate Cloth  6 each Topical Daily  . diltiazem  60 mg Per NG tube Q6H  . furosemide  60 mg Intravenous Q12H  . insulin aspart  0-15 Units Subcutaneous Q4H  . ipratropium-albuterol  3 mL Nebulization Q4H  . lisinopril  5 mg Per NG tube Daily  . mouth rinse  15  mL Mouth Rinse 10 times per day  . methylPREDNISolone (SOLU-MEDROL) injection  60 mg Intravenous Q12H  . metoprolol tartrate  50 mg Per NG tube BID  . midazolam  4 mg Intravenous Once  . pantoprazole (PROTONIX) IV  40 mg Intravenous Daily  . pravastatin  80 mg Per NG tube QPM  . [START ON 11/19/2018] warfarin  2.5 mg Per NG tube Once per day on Mon Fri  . warfarin  4 mg Per NG tube Once per day on Sun Tue Wed Thu Sat  . Warfarin - Physician Dosing Inpatient   Does not apply q1800   PRN Medications: acetaminophen, bisacodyl, colchicine, fentaNYL, midazolam, ondansetron (ZOFRAN) IV, sennosides, sodium chloride flush, vecuronium Hemodynamic parameters:   Intake/Output: 08/29 0701 - 08/30 0700 In: 1763.3 [I.V.:1763.3] Out: 2650 [Urine:2450; Emesis/NG output:200]  Ventilator  Settings:  Vent Mode: PRVC FiO2 (%):  [40 %-50 %] 40 % Set Rate:  [20 bmp-25 bmp] 20 bmp Vt Set:  [500 mL] 500 mL PEEP:  [8 cmH20] 8 cmH20 Plateau Pressure:  [16 cmH20-26 cmH20] 16 cmH20   Other Labs:   LAB RESULTS:  Basic Metabolic Panel: Recent Labs  Lab 10/20/2018 1655 11/17/18 0029 11/17/18 0423 11/18/18 0501  NA 135 136 135 138  K 4.8 4.8 5.1 3.9  CL 92* 94* 94* 96*  CO2 34* _0 GLUCOSE 201* 172* 222* 210*  BUN 34* 34* 35* 41*  CREATININE 1.31* 1.28* 1.35* 1.36*  CALCIUM 8.8* 8.7* 8.7* 8.7*  MG  --  2.6*  --  2.5*  PHOS  --  5.0*  --  3.8   Liver Function Tests: Recent Labs  Lab 10/25/2018 1655 11/17/18 0029  AST 23 20  ALT 29 25  ALKPHOS 67 64  BILITOT 0.5 1.0  PROT 6.9 6.6  ALBUMIN 3.6 3.5   No results for input(s): LIPASE, AMYLASE in the last 168 hours. No results for input(s): AMMONIA in the last 168 hours. CBC: Recent Labs  Lab 10/27/2018 1655 11/17/18 0029 11/18/18 0501  WBC 7.5 7.7 6.1  NEUTROABS 5.7 5.7  --   HGB 13.6 13.6 13.3  HCT 44.2 43.1 40.9  MCV 98.0 96.4 92.7  PLT 192 166 155   Cardiac Enzymes: No results for input(s): CKTOTAL, CKMB, CKMBINDEX, TROPONINI in the last 168 hours. BNP: Invalid input(s): POCBNP CBG: Recent Labs  Lab 11/17/18 1609 11/17/18 2001 11/18/18 0011 11/18/18 0412 11/18/18 0759  GLUCAP 259* 245* 207* 191* 199*     IMAGING RESULTS:  Imaging: Ct Abdomen Pelvis Wo Contrast  Result Date: 11/17/2018 CLINICAL DATA:  Abdominal rigidity. Concern for perforated viscus. Currently intubated. EXAM: CT ABDOMEN AND PELVIS WITHOUT CONTRAST TECHNIQUE: Multidetector CT imaging of the abdomen and pelvis was performed following the standard protocol without IV contrast. COMPARISON:  None. FINDINGS: Lower chest: Small right and trace left pleural effusions with bilateral lower lobe airspace disease. Small amount of herniated right middle lobe between the fifth and sixth ribs. Hepatobiliary: Severe hepatic steatosis. Several  small gallstones. No gallbladder wall thickening or biliary dilatation. Pancreas: Unremarkable. No pancreatic ductal dilatation or surrounding inflammatory changes. Spleen: Normal in size without focal abnormality. Adrenals/Urinary Tract: Adrenal glands are unremarkable. Punctate calculus in the lower pole the left kidney. No hydronephrosis. Bladder is decompressed by Foley catheter. Stomach/Bowel: Enteric tube tip in the distal stomach. Stomach is within normal limits. Appendix appears normal. No evidence of bowel wall thickening, distention, or inflammatory changes. Vascular/Lymphatic: Aortic atherosclerosis. No enlarged abdominal or pelvic lymph nodes. Reproductive:  Prostate is unremarkable. Other: Trace ascites.  No pneumoperitoneum. Musculoskeletal: No acute or significant osseous findings. IMPRESSION: 1.  No acute intra-abdominal process.  No perforation. 2. Trace ascites. 3. Severe hepatic steatosis. 4. Cholelithiasis. 5. Punctate nonobstructive left nephrolithiasis. 6. Small right and trace left pleural effusions. Bilateral lower lobe airspace disease may represent atelectasis, but pneumonia is difficult to exclude, particularly on the right. Electronically Signed   By: Titus Dubin M.D.   On: 11/17/2018 13:50   Dg Abd 1 View  Result Date: 11/17/2018 CLINICAL DATA:  Abdomen distension tube placement EXAM: ABDOMEN - 1 VIEW COMPARISON:  11/05/2018 FINDINGS: Esophageal tube tip visible to the distal stomach but incompletely included. Airspace disease at the right base. IMPRESSION: Esophageal tubing visible to the gastric body but tip is incompletely included in the field of view. Electronically Signed   By: Donavan Foil M.D.   On: 11/17/2018 00:10   Dg Abd 1 View  Result Date: 11/09/2018 CLINICAL DATA:  Abdominal distention EXAM: ABDOMEN - 1 VIEW COMPARISON:  None. FINDINGS: Limited study due to portable nature of the study and body habitus. I see no significant bowel dilatation or free air. Study  otherwise limited. IMPRESSION: Very limited study. No visible changes of bowel obstruction or free air. Electronically Signed   By: Rolm Baptise M.D.   On: 10/27/2018 22:33   Dg Chest Port 1 View  Result Date: 11/17/2018 CLINICAL DATA:  Intubation EXAM: PORTABLE CHEST 1 VIEW COMPARISON:  11/17/2018, 11/09/2018 FINDINGS: Endotracheal tube tip is about 3.1 cm superior to the carina. Esophageal tube tip extends below the diaphragm but is non included. Bilateral pleural effusions, likely layering on the right. Cardiomegaly with vascular congestion and bilateral pulmonary edema. Basilar consolidations. No pneumothorax. IMPRESSION: 1. Endotracheal tube tip about 3.1 cm superior to carina 2. Cardiomegaly with vascular congestion, pulmonary edema and bilateral pleural effusion Electronically Signed   By: Donavan Foil M.D.   On: 11/17/2018 00:12   Dg Chest Port 1 View  Result Date: 11/10/2018 CLINICAL DATA:  Shortness of breath for 2 weeks. EXAM: PORTABLE CHEST 1 VIEW COMPARISON:  11/04/2010 radiograph FINDINGS: Cardiomegaly with pulmonary vascular congestion noted. Possible mild interstitial opacities/edema noted. No pneumothorax or acute bony abnormality. Bibasilar atelectasis noted. There may be trace pleural effusions present. IMPRESSION: Cardiomegaly with pulmonary vascular congestion and possible mild interstitial edema. Bibasilar atelectasis.  Possible trace pleural effusions. Electronically Signed   By: Margarette Canada M.D.   On: 11/11/2018 17:13      ASSESSMENT AND PLAN    -Multidisciplinary rounds held today  Acute Hypoxic Respiratory Failure - likely due to acute decompensated CHF with OHS/OSA,COPD, atelctasis, pl effusion +/- pneumonia  -continue NIV support -continue Bronchodilator Therapy -Wean Fio2 on BIPAP   Acute decompensated diastolic CHF with EF >16% - hx of mitral valve repair - repeat TTE - Lasix IV 40 BID- have made little progress with diuresis despite 2500cc urine overnight  due to high volume IV sedation requirement, however now hes off MV and diuresis should be for beneficial -I&O strict via Foley -oxygen as needed -follow up cardiac enzymes as indicated ICU monitoring   Abdominal distension   - concern for poss perforation    - KUB with poor visualization due to body habitus and portable image    - CT abdomen with no acute intraabdominal dz    Renal Failure-acute on chronic due to cardiorenal syndrome with diabetic nephropathy -monitor for worsening GFR while diuresing -follow chem 7 -follow UO -continue Foley Catheter-assess  need daily    Electrolyte derrangements  mag elevated Phos elevated pottasium repletion -pharmacy consultation for electrolytes   ID -continue IV abx as prescibed -follow up cultures  GI/Nutrition GI PROPHYLAXIS as indicated DIET-->TF's as tolerated Constipation protocol as indicated  ENDO - ICU hypoglycemic\Hyperglycemia protocol -check FSBS per protocol   ELECTROLYTES -follow labs as needed -replace as needed -pharmacy consultation   DVT/GI PRX ordered -SCDs  TRANSFUSIONS AS NEEDED MONITOR FSBS ASSESS the need for LABS as needed   Critical care provider statement:    Critical care time (minutes):  35   Critical care time was exclusive of:  Separately billable procedures and treating other patients   Critical care was necessary to treat or prevent imminent or life-threatening deterioration of the following conditions:  acute hypoxemic respiratory failure, acute decompensated HFpEF, abd distension, dm, copd, osa, dm, multiple comorbid conditions   Critical care was time spent personally by me on the following activities:  Development of treatment plan with patient or surrogate, discussions with consultants, evaluation of patient's response to treatment, examination of patient, obtaining history from patient or surrogate, ordering and performing treatments and interventions, ordering and review of  laboratory studies and re-evaluation of patient's condition.  I assumed direction of critical care for this patient from another provider in my specialty: no    This document was prepared using Dragon voice recognition software and may include unintentional dictation errors.    Ottie Glazier, M.D.  Division of [place]

## 2018-11-18 NOTE — Progress Notes (Signed)
Pharmacy Electrolyte Monitoring Consult:  Pharmacy consulted to assist in monitoring and replacing electrolytes in this 58 y.o. male admitted on 11/10/2018. Patient admitted to ICU on 8/29 and intubated. Patient with past medical history significant for atrial fibrillation, CHF, diabetes, gout, hypertension, obesity, and sleep apnea.   Labs:  Sodium (mmol/L)  Date Value  11/18/2018 138   Potassium (mmol/L)  Date Value  11/18/2018 3.9   Magnesium (mg/dL)  Date Value  11/18/2018 2.5 (H)   Phosphorus (mg/dL)  Date Value  11/18/2018 3.8   Calcium (mg/dL)  Date Value  11/18/2018 8.7 (L)   Albumin (g/dL)  Date Value  11/17/2018 3.5    Assessment/Plan: Patient ordered furosemide 60mg  IV BID. Will order potassium 99mEq VT x 1 to be given with evening dose of furosemide.   Will replace for goal potassium ~ 4 and goal magnesium ~ 2. Will replace via tube in setting of diuresis.   Will obtain BMP with am labs and magnesium/phosphorus as warranted.   Pharmacy will continue to monitor and adjust per consult.   Jazzlyn Huizenga L 11/18/2018 11:20 AM

## 2018-11-18 NOTE — Progress Notes (Signed)
Rt called to room to assess patients ETCO2 monitor due to large fluctuations in readings. When RT arrived, patient was found to have a large cuff leak. Air was placed into cuff without any success at stopping the leak, even with 60 psi in cuff. Rn called Dr. Laural Roes into assess patients airway and the possibility of extubation since patient was more awake and off sedation. Verbal order was given for extubation and patient was successfully extubated to Bipap 18/8 and 100%. Patient tol well at this time, will continue to monitor.

## 2018-11-18 NOTE — Consult Note (Signed)
ANTICOAGULATION CONSULT NOTE - Initial Consult  Pharmacy Consult for Heparin Drip Indication: atrial fibrillation  No Known Allergies  Patient Measurements: Height: _0  (175.3 cm) Weight: (!) 380 lb 11.8 oz (172.7 kg) IBW/kg (Calculated) : 70.7 Heparin Dosing Weight: 113.8 kg  Vital Signs: Temp: 98.6 F (37 C) (08/30 1600) Temp Source: Axillary (08/30 1600) BP: 139/83 (08/30 1600) Pulse Rate: 104 (08/30 1700)  Labs: Recent Labs    11/13/2018 1655 11/17/18 0029 11/17/18 0423 11/17/18 0509 11/18/18 0501  HGB 13.6 13.6  --   --  13.3  HCT 44.2 43.1  --   --  40.9  PLT 192 166  --   --  155  LABPROT 30.9* 33.0*  --  32.3* 33.6*  INR 3.0* 3.3*  --  3.2* 3.4*  CREATININE 1.31* 1.28* 1.35*  --  1.36*  TROPONINIHS 14 23*  --   --   --     Estimated Creatinine Clearance: 92.2 mL/min (A) (by C-G formula based on SCr of 1.36 mg/dL (H)).   Medical History: Past Medical History:  Diagnosis Date  . Atrial fibrillation ([place])   . CHF (congestive heart failure) ([place])   . Diabetes mellitus without complication ([place])   . Gout   . Hypertension associated with type 2 diabetes mellitus ([place])   . Morbid obesity ([place])   . Obstructive sleep apnea     Medications:  Medications Prior to Admission  Medication Sig Dispense Refill Last Dose  . aspirin EC 81 MG tablet Take 81 mg by mouth daily.   11/15/2018 at Unknown time  . colchicine 0.6 MG tablet Take 12 mg by mouth. At first sing of gout flare   prn at prn  . diltiazem (CARDIZEM CD) 240 MG 24 hr capsule Take 240 mg by mouth daily.   11/15/2018 at Unknown time  . furosemide (LASIX) 40 MG tablet Take 40 mg by mouth 2 (two) times daily.   11/15/2018 at Unknown time  . lisinopril (ZESTRIL) 5 MG tablet Take 5 mg by mouth daily.   11/15/2018 at Unknown time  . metFORMIN (GLUCOPHAGE) 500 MG tablet Take 500 mg by mouth 2 (two) times daily with a meal.   11/15/2018 at Unknown time  . metoprolol tartrate (LOPRESSOR) 100 MG tablet Take 50 mg by  mouth 2 (two) times daily.   11/15/2018 at Unknown time  . pravastatin (PRAVACHOL) 80 MG tablet Take 80 mg by mouth every evening.   11/15/2018 at Unknown time  . warfarin (COUMADIN) 4 MG tablet Take 2.5-4 mg by mouth daily. Take 4 mg on Sunday, Tuesday, Wednesday, Thursday, and Saturday. Take 2.5 mg on Monday and Friday   11/15/2018 at Unknown time   Scheduled:  . allopurinol  100 mg Oral Daily  . aspirin  81 mg Per NG tube Daily  . chlorhexidine gluconate (MEDLINE KIT)  15 mL Mouth Rinse BID  . Chlorhexidine Gluconate Cloth  6 each Topical Daily  . diltiazem  240 mg Oral Daily  . insulin aspart  0-15 Units Subcutaneous Q4H  . ipratropium-albuterol  3 mL Nebulization Q4H  . lisinopril  5 mg Per NG tube Daily  . mouth rinse  15 mL Mouth Rinse 10 times per day  . methylPREDNISolone (SOLU-MEDROL) injection  60 mg Intravenous BID  . metoprolol tartrate  50 mg Per NG tube BID  . pantoprazole sodium  40 mg Per Tube QHS  . potassium chloride  40 mEq Per Tube Once  . pravastatin  80 mg Per  NG tube QPM   Infusions:  . dexmedetomidine (PRECEDEX) IV infusion 1 mcg/kg/hr (11/18/18 1729)  . diltiazem (CARDIZEM) infusion 15 mg/hr (11/18/18 1454)   PRN: acetaminophen, bisacodyl, midazolam, ondansetron (ZOFRAN) IV, sennosides, sodium chloride flush, vecuronium Anti-infectives (From admission, onward)   None      Assessment: Pharmacy consulted to initiate Heparin Drip on 58yo patient admitted with acute exacerbation of CHF. Patient diagnosed with nonvalvular atrial fibrillation with acute onset of severe shortness of breath, weakness and fatigue, and hypoxia over the past week. Patient takes warfarin PTA with schedule being 68m SuTWThSa, and 2.572mMF which was restarted in-patient. INR level on 8/30 was 3.4. Reorder of INR and baseline APTT have been ordered and are pending. [name] Hgb is stable. Will transition from Warfarin to Heparin while in-patient. Last warfarin dose was taken 8/29_0   Goal  of Therapy:  Heparin level 0.3-0.7 units/ml Monitor platelets by anticoagulation protocol: Yes   Plan:  Will avoid initiation of heparin drip currently as patient's INR was elevated.  Will recheck INR with AM labs and assess start time of Heparin drip.  Wissam Resor A Sarahann Horrell 11/18/2018,7:39 PM

## 2018-11-18 NOTE — Progress Notes (Signed)
[facility] NAME: Kyle White    MR#:  [id number]  DATE OF BIRTH:  [birth year]  SUBJECTIVE:  CHIEF COMPLAINT:   Chief Complaint  Patient presents with   Shortness of Breath   Weakness  sedated on vent REVIEW OF SYSTEMS:  ROS: unable to obtain as on vent DRUG ALLERGIES:  No Known Allergies VITALS:  Blood pressure 97/62, pulse 91, temperature 98 F (36.7 C), temperature source Axillary, resp. rate 20, height 5\' 9"  (1.753 m), weight (!) 172.7 kg, SpO2 97 %. PHYSICAL EXAMINATION:  Physical Exam Constitutional:      General: He is sleeping.  HENT:     Head: Normocephalic and atraumatic.     Comments: ETT in place Eyes:     Conjunctiva/sclera: Conjunctivae normal.     Pupils: Pupils are equal, round, and reactive to light.  Neck:     Musculoskeletal: Normal range of motion and neck supple.     Thyroid: No thyromegaly.     Trachea: No tracheal deviation.  Cardiovascular:     Rate and Rhythm: Normal rate and regular rhythm.     Heart sounds: Normal heart sounds.  Pulmonary:     Effort: Pulmonary effort is normal. No respiratory distress.     Breath sounds: Normal breath sounds. No wheezing.  Chest:     Chest wall: No tenderness.  Abdominal:     General: Bowel sounds are decreased. There is no distension.     Palpations: Abdomen is rigid.     Tenderness: There is no abdominal tenderness.  Musculoskeletal: Normal range of motion.  Skin:    General: Skin is warm and dry.     Findings: No rash.  Neurological:     Cranial Nerves: No cranial nerve deficit.     Comments: Sedated on vent  Psychiatric:     Comments: Sedated on vent    LABORATORY PANEL:  Male CBC Recent Labs  Lab 11/18/18 0501  WBC 6.1  HGB 13.3  HCT 40.9  PLT 155   ------------------------------------------------------------------------------------------------------------------ Chemistries  Recent Labs  Lab 11/17/18 0029  11/18/18 0501    NA 136   < > 138  K 4.8   < > 3.9  CL 94*   < > 96*  CO2 31   < > 29  GLUCOSE 172*   < > 210*  BUN 34*   < > 41*  CREATININE 1.28*   < > 1.36*  CALCIUM 8.7*   < > 8.7*  MG 2.6*  --  2.5*  AST 20  --   --   ALT 25  --   --   ALKPHOS 64  --   --   BILITOT 1.0  --   --    < > = values in this interval not displayed.   RADIOLOGY:  Ct Abdomen Pelvis Wo Contrast  Result Date: 11/17/2018 CLINICAL DATA:  Abdominal rigidity. Concern for perforated viscus. Currently intubated. EXAM: CT ABDOMEN AND PELVIS WITHOUT CONTRAST TECHNIQUE: Multidetector CT imaging of the abdomen and pelvis was performed following the standard protocol without IV contrast. COMPARISON:  None. FINDINGS: Lower chest: Small right and trace left pleural effusions with bilateral lower lobe airspace disease. Small amount of herniated right middle lobe between the fifth and sixth ribs. Hepatobiliary: Severe hepatic steatosis. Several small gallstones. No gallbladder wall thickening or biliary dilatation. Pancreas: Unremarkable. No pancreatic ductal dilatation or surrounding inflammatory changes. Spleen: Normal in size without focal abnormality. Adrenals/Urinary  Tract: Adrenal glands are unremarkable. Punctate calculus in the lower pole the left kidney. No hydronephrosis. Bladder is decompressed by Foley catheter. Stomach/Bowel: Enteric tube tip in the distal stomach. Stomach is within normal limits. Appendix appears normal. No evidence of bowel wall thickening, distention, or inflammatory changes. Vascular/Lymphatic: Aortic atherosclerosis. No enlarged abdominal or pelvic lymph nodes. Reproductive: Prostate is unremarkable. Other: Trace ascites.  No pneumoperitoneum. Musculoskeletal: No acute or significant osseous findings. IMPRESSION: 1.  No acute intra-abdominal process.  No perforation. 2. Trace ascites. 3. Severe hepatic steatosis. 4. Cholelithiasis. 5. Punctate nonobstructive left nephrolithiasis. 6. Small right and trace left  pleural effusions. Bilateral lower lobe airspace disease may represent atelectasis, but pneumonia is difficult to exclude, particularly on the right. Electronically Signed   By: Titus Dubin M.D.   On: 11/17/2018 13:50   ASSESSMENT AND PLAN:   1.  Acute on chronic hypoxic respiratory failure - vent mgmt per PCCM team  2.  Obstructive sleep apnea  3.  Acute on chronic diastolic CHF, EF 55 to 123456 on echo in 2014 at [facility] - Repeat echocardiogram pending - Lasix 60 mg IV twice daily  4.  History of atrial fibrillation - Continue Coumadin - INR 3.4  5.  Acute renal failure with creatinine 1.36 (1.28) - Repeat BMP in the a.m. and continue to monitor renal function closely  6.  Type II Diabetes mellitus - Sliding scale insulin -Hemoglobin A1c 7.3  7.  Generalized weakness -Physical therapy eval once extubated  8.  Abdominal distention -No acute pathology on CT scan or KUB performed yesterday -Could be ileus, NG tube in place  PPI prophylaxis     All the records are reviewed and case discussed with Care Management/Social Worker. Management plans discussed with the patient, nursing and they are in agreement.  CODE STATUS: Full Code  TOTAL TIME TAKING CARE OF THIS PATIENT: 15 minutes.   More than 50% of the time was spent in counseling/coordination of care: YES  POSSIBLE D/C IN 3-4 DAYS, DEPENDING ON CLINICAL CONDITION.   Max Sane M.D on 11/18/2018 at 12:54 PM  Between 7am to 6pm - Pager - [phone]  After 6pm go to [email or web address] - Technical brewer [facility]  Office  [phone]  CC: Primary care physician; [employer]  Note: This dictation was prepared with Diplomatic Services operational officer dictation along with smaller Company secretary. Any transcriptional errors that result from this process are unintentional.

## 2018-11-18 NOTE — Progress Notes (Signed)
[place] for warfarin dosing  Indication: atrial fibrillation  No Known Allergies  Patient Measurements: Height: 5' 9"  (175.3 cm) Weight: (!) 380 lb 11.8 oz (172.7 kg) IBW/kg (Calculated) : 70.7   Vital Signs: Temp: 98 F (36.7 C) (08/30 0800) Temp Source: Axillary (08/30 0800) BP: 107/73 (08/30 0900) Pulse Rate: 91 (08/30 0900)  Labs: Recent Labs    11/02/2018 1655 11/17/18 0029 11/17/18 0423 11/17/18 0509 11/18/18 0501  HGB 13.6 13.6  --   --  13.3  HCT 44.2 43.1  --   --  40.9  PLT 192 166  --   --  155  LABPROT 30.9* 33.0*  --  32.3* 33.6*  INR 3.0* 3.3*  --  3.2* 3.4*  CREATININE 1.31* 1.28* 1.35*  --  1.36*  TROPONINIHS 14 23*  --   --   --     Estimated Creatinine Clearance: 92.2 mL/min (A) (by C-G formula based on SCr of 1.36 mg/dL (H)).   Medical History: Past Medical History:  Diagnosis Date  . Atrial fibrillation ([place])   . CHF (congestive heart failure) ([place])   . Diabetes mellitus without complication ([place])   . Gout   . Hypertension associated with type 2 diabetes mellitus ([place])   . Morbid obesity ([place])   . Obstructive sleep apnea     Medications:  Scheduled:  . aspirin  81 mg Per NG tube Daily  . chlorhexidine gluconate (MEDLINE KIT)  15 mL Mouth Rinse BID  . Chlorhexidine Gluconate Cloth  6 each Topical Daily  . diltiazem  60 mg Per NG tube Q6H  . furosemide  60 mg Intravenous Q12H  . insulin aspart  0-15 Units Subcutaneous Q4H  . ipratropium-albuterol  3 mL Nebulization Q4H  . lisinopril  5 mg Per NG tube Daily  . mouth rinse  15 mL Mouth Rinse 10 times per day  . methylPREDNISolone (SOLU-MEDROL) injection  60 mg Intravenous Q12H  . metoprolol tartrate  50 mg Per NG tube BID  . midazolam  4 mg Intravenous Once  . pantoprazole (PROTONIX) IV  40 mg Intravenous Daily  . pravastatin  80 mg Per NG tube QPM   Infusions:  . fentaNYL infusion INTRAVENOUS 175 mcg/hr (11/18/18 1050)  . norepinephrine  (LEVOPHED) Adult infusion Stopped (11/17/18 0301)  . propofol (DIPRIVAN) infusion 25 mcg/kg/min (11/18/18 0700)    Assessment: Pharmacy consulted for warfarin management for 58 yo male on warfarin as an outpatient for atrial fibrillation. Patient takes warfarin 11m Sunday, Tuesday, Wednesday, Thursday, and Saturday and warfarin 2.59mon Monday and Friday for goal INR 2-3. INR has been > 3 since admission. Patient received warfarin 11m39mn 8/29.   Goal of Therapy:  INR 2-3 Monitor platelets by anticoagulation protocol: Yes   Plan:  Will hold warfarin dose on 8/30 and obtain INR with am labs. Will continue warfarin for goal INR 2-3.   Pharmacy will continue to monitor and adjust per consult.   Yamili Lichtenwalner L 11/18/2018,11:07 AM

## 2018-11-18 NOTE — Progress Notes (Signed)
PHARMACIST - PHYSICIAN COMMUNICATION  CONCERNING: IV to Oral Route Change Policy  RECOMMENDATION: This patient is receiving pantoprazole by the intravenous route.  Based on criteria approved by the Pharmacy and Therapeutics Committee, the intravenous medication(s) is/are being converted to the equivalent oral dose form(s).   DESCRIPTION: These criteria include:  The patient is eating (either orally or via tube) and/or has been taking other orally administered medications for a least 24 hours  The patient has no evidence of active gastrointestinal bleeding or impaired GI absorption (gastrectomy, short bowel, patient on TNA or NPO).  If you have questions about this conversion, please contact the Pharmacy Department at [phone].   Simpson,Michael L, [facility] 11/18/2018 11:32 AM

## 2018-11-18 NOTE — Consult Note (Signed)
[facility] Consultation Note  [name], MRN: [id number], DOB/AGE: 1960-09-06 58 y.o. Admit date: 11/15/2018   Date of Consult: 11/18/2018 Primary Physician: [employer] Primary Cardiologist: None  Chief Complaint:  Chief Complaint  Patient presents with  . Shortness of Breath  . Weakness   Reason for Consult: A. fib  HPI: 58 y.o. male with known apparent valvular heart disease essential hypertension mixed hyperlipidemia diabetes and paroxysmal nonvalvular atrial fibrillation with acute onset of severe shortness of breath weakness and fatigue and hypoxia over a 1 week..  The patient has had significant valvular heart disease with apparent mitral valve repair for which the patient has been on appropriate medication management.  In addition to that the patient has had paroxysmal nonvalvular atrial fibrillation which looks to be an more persistent at this time likely causing acute on chronic diastolic dysfunction congestive heart failure with chest x-ray suggestive of some pulmonary edema.  The patient has been having significant hypoxia from multiple issues at this time.  His oxygenation has slightly improved since addition of medication management and pulmonary toilet and oxygenation and BiPAP.  The patient is hemodynamically stable but still has significant atrial fibrillation with rapid ventricular rate despite diltiazem drip and metoprolol use.  There is no current evidence of myocardial infarction and no previous history of coronary artery disease.  The patient is hemodynamically stable at this time.  The patient has had an echocardiogram showing normal LV systolic function with ejection fraction of 55% and normal valvular repair with no evidence of significant previous changes Past Medical History:  Diagnosis Date  . Atrial fibrillation ([place])   . CHF (congestive heart failure) ([place])   . Diabetes mellitus without complication ([place])   . Gout    . Hypertension associated with type 2 diabetes mellitus ([place])   . Morbid obesity ([place])   . Obstructive sleep apnea       Surgical History:  Past Surgical History:  Procedure Laterality Date  . MITRAL VALVE REPAIR       Home Meds: Prior to Admission medications   Medication Sig Start Date End Date Taking? Authorizing Provider  aspirin EC 81 MG tablet Take 81 mg by mouth daily.   Yes [provider]  colchicine 0.6 MG tablet Take 12 mg by mouth. At first sing of gout flare   Yes [provider]  diltiazem (CARDIZEM CD) 240 MG 24 hr capsule Take 240 mg by mouth daily.   Yes [provider]  furosemide (LASIX) 40 MG tablet Take 40 mg by mouth 2 (two) times daily.   Yes [provider]  lisinopril (ZESTRIL) 5 MG tablet Take 5 mg by mouth daily.   Yes [provider]  metFORMIN (GLUCOPHAGE) 500 MG tablet Take 500 mg by mouth 2 (two) times daily with a meal.   Yes [provider]  metoprolol tartrate (LOPRESSOR) 100 MG tablet Take 50 mg by mouth 2 (two) times daily.   Yes [provider]  pravastatin (PRAVACHOL) 80 MG tablet Take 80 mg by mouth every evening.   Yes [provider]  warfarin (COUMADIN) 4 MG tablet Take 2.5-4 mg by mouth daily. Take 4 mg on Sunday, Tuesday, Wednesday, Thursday, and Saturday. Take 2.5 mg on Monday and Friday   Yes [provider]    Inpatient Medications:  . allopurinol  100 mg Oral Daily  . aspirin  81 mg Per NG tube Daily  . chlorhexidine gluconate (MEDLINE KIT)  15 mL Mouth Rinse BID  . Chlorhexidine Gluconate Cloth  6 each Topical Daily  . diltiazem  240 mg Oral Daily  . furosemide  60 mg Intravenous Q12H  . insulin aspart  0-15 Units Subcutaneous Q4H  . ipratropium-albuterol  3 mL Nebulization Q4H  . lisinopril  5 mg Per NG tube Daily  . mouth rinse  15 mL Mouth Rinse 10 times per day  . methylPREDNISolone (SOLU-MEDROL) injection  60 mg Intravenous BID  . metoprolol  tartrate  50 mg Per NG tube BID  . pantoprazole sodium  40 mg Per Tube QHS  . potassium chloride  40 mEq Per Tube Once  . pravastatin  80 mg Per NG tube QPM  . Warfarin - Pharmacist Dosing Inpatient   Does not apply q1800   . diltiazem (CARDIZEM) infusion 10 mg/hr (11/18/18 1427)    Allergies: No Known Allergies  Social History   Socioeconomic History  . Marital status: Unknown    Spouse name: Not on file  . Number of children: Not on file  . Years of education: Not on file  . Highest education level: Not on file  Occupational History  . Not on file  Social Needs  . Financial resource strain: Not on file  . Food insecurity    Worry: Not on file    Inability: Not on file  . Transportation needs    Medical: Not on file    Non-medical: Not on file  Tobacco Use  . Smoking status: Never Smoker  . Smokeless tobacco: Never Used  Substance and Sexual Activity  . Alcohol use: Never    Frequency: Never  . Drug use: Never  . Sexual activity: Not on file  Lifestyle  . Physical activity    Days per week: Not on file    Minutes per session: Not on file  . Stress: Not on file  Relationships  . Social Herbalist on phone: Not on file    Gets together: Not on file    Attends religious service: Not on file    Active member of club or organization: Not on file    Attends meetings of clubs or organizations: Not on file    Relationship status: Not on file  . Intimate partner violence    Fear of current or ex partner: Not on file    Emotionally abused: Not on file    Physically abused: Not on file    Forced sexual activity: Not on file  Other Topics Concern  . Not on file  Social History Narrative  . Not on file     History reviewed. No pertinent family history.   Review of Systems Positive for shortness of breath cough congestion Negative for: General:  chills, fever, night sweats or weight changes.  Cardiovascular: PND orthopnea syncope dizziness   Dermatological skin lesions rashes Respiratory: Positive for cough congestion Urologic: Frequent urination urination at night and hematuria Abdominal: negative for nausea, vomiting, diarrhea, bright red blood per rectum, melena, or hematemesis Neurologic: negative for visual changes, and/or hearing changes  All other systems reviewed and are otherwise negative except as noted above.  Labs: No results for input(s): CKTOTAL, CKMB, TROPONINI in the last 72 hours. Lab Results  Component Value Date   WBC 6.1 11/18/2018   HGB 13.3 11/18/2018   HCT 40.9 11/18/2018   MCV 92.7 11/18/2018   PLT 155 11/18/2018    Recent Labs  Lab 11/17/18 0029  11/18/18 0501  NA  136   < > 138  K 4.8   < > 3.9  CL 94*   < > 96*  CO2 31   < > 29  BUN 34*   < > 41*  CREATININE 1.28*   < > 1.36*  CALCIUM 8.7*   < > 8.7*  PROT 6.6  --   --   BILITOT 1.0  --   --   ALKPHOS 64  --   --   ALT 25  --   --   AST 20  --   --   GLUCOSE 172*   < > 210*   < > = values in this interval not displayed.   Lab Results  Component Value Date   TRIG 113 11/17/2018   No results found for: DDIMER  Radiology/Studies:  Ct Abdomen Pelvis Wo Contrast  Result Date: 11/17/2018 CLINICAL DATA:  Abdominal rigidity. Concern for perforated viscus. Currently intubated. EXAM: CT ABDOMEN AND PELVIS WITHOUT CONTRAST TECHNIQUE: Multidetector CT imaging of the abdomen and pelvis was performed following the standard protocol without IV contrast. COMPARISON:  None. FINDINGS: Lower chest: Small right and trace left pleural effusions with bilateral lower lobe airspace disease. Small amount of herniated right middle lobe between the fifth and sixth ribs. Hepatobiliary: Severe hepatic steatosis. Several small gallstones. No gallbladder wall thickening or biliary dilatation. Pancreas: Unremarkable. No pancreatic ductal dilatation or surrounding inflammatory changes. Spleen: Normal in size without focal abnormality. Adrenals/Urinary Tract:  Adrenal glands are unremarkable. Punctate calculus in the lower pole the left kidney. No hydronephrosis. Bladder is decompressed by Foley catheter. Stomach/Bowel: Enteric tube tip in the distal stomach. Stomach is within normal limits. Appendix appears normal. No evidence of bowel wall thickening, distention, or inflammatory changes. Vascular/Lymphatic: Aortic atherosclerosis. No enlarged abdominal or pelvic lymph nodes. Reproductive: Prostate is unremarkable. Other: Trace ascites.  No pneumoperitoneum. Musculoskeletal: No acute or significant osseous findings. IMPRESSION: 1.  No acute intra-abdominal process.  No perforation. 2. Trace ascites. 3. Severe hepatic steatosis. 4. Cholelithiasis. 5. Punctate nonobstructive left nephrolithiasis. 6. Small right and trace left pleural effusions. Bilateral lower lobe airspace disease may represent atelectasis, but pneumonia is difficult to exclude, particularly on the right. Electronically Signed   By: Titus Dubin M.D.   On: 11/17/2018 13:50   Dg Abd 1 View  Result Date: 11/17/2018 CLINICAL DATA:  Abdomen distension tube placement EXAM: ABDOMEN - 1 VIEW COMPARISON:  10/31/2018 FINDINGS: Esophageal tube tip visible to the distal stomach but incompletely included. Airspace disease at the right base. IMPRESSION: Esophageal tubing visible to the gastric body but tip is incompletely included in the field of view. Electronically Signed   By: Donavan Foil M.D.   On: 11/17/2018 00:10   Dg Abd 1 View  Result Date: 10/25/2018 CLINICAL DATA:  Abdominal distention EXAM: ABDOMEN - 1 VIEW COMPARISON:  None. FINDINGS: Limited study due to portable nature of the study and body habitus. I see no significant bowel dilatation or free air. Study otherwise limited. IMPRESSION: Very limited study. No visible changes of bowel obstruction or free air. Electronically Signed   By: Rolm Baptise M.D.   On: 11/15/2018 22:33   Dg Chest Port 1 View  Result Date: 11/17/2018 CLINICAL DATA:   Intubation EXAM: PORTABLE CHEST 1 VIEW COMPARISON:  11/17/2018, 11/13/2018 FINDINGS: Endotracheal tube tip is about 3.1 cm superior to the carina. Esophageal tube tip extends below the diaphragm but is non included. Bilateral pleural effusions, likely layering on the right. Cardiomegaly with vascular congestion  and bilateral pulmonary edema. Basilar consolidations. No pneumothorax. IMPRESSION: 1. Endotracheal tube tip about 3.1 cm superior to carina 2. Cardiomegaly with vascular congestion, pulmonary edema and bilateral pleural effusion Electronically Signed   By: Donavan Foil M.D.   On: 11/17/2018 00:12   Dg Chest Port 1 View  Result Date: 11/05/2018 CLINICAL DATA:  Shortness of breath for 2 weeks. EXAM: PORTABLE CHEST 1 VIEW COMPARISON:  11/04/2010 radiograph FINDINGS: Cardiomegaly with pulmonary vascular congestion noted. Possible mild interstitial opacities/edema noted. No pneumothorax or acute bony abnormality. Bibasilar atelectasis noted. There may be trace pleural effusions present. IMPRESSION: Cardiomegaly with pulmonary vascular congestion and possible mild interstitial edema. Bibasilar atelectasis.  Possible trace pleural effusions. Electronically Signed   By: Margarette Canada M.D.   On: 11/03/2018 17:13    EKG: Atrial fibrillation with rapid ventricular rate  Weights: Filed Weights   11/03/2018 2135 11/17/18 0420 11/18/18 0500  Weight: (!) 173 kg (!) 173.2 kg (!) 172.7 kg     Physical Exam: Blood pressure (!) 156/131, pulse 71, temperature (!) 97 F (36.1 C), temperature source Axillary, resp. rate 18, height 5' 9"  (1.753 m), weight (!) 172.7 kg, SpO2 100 %. Body mass index is 56.22 kg/m. General: Well developed, well nourished, in no acute distress. Head eyes ears nose throat: Normocephalic, atraumatic, sclera non-icteric, no xanthomas, nares are without discharge. No apparent thyromegaly and/or mass  Lungs: Normal respiratory effort.  Diffuse wheezes, no rales, no rhonchi.  Heart:  Irregular with normal S1 S2. no murmur gallop, no rub, PMI is normal size and placement, carotid upstroke normal without bruit, jugular venous pressure is normal Abdomen: Soft, non-tender, significantly distended with normoactive bowel sounds. No hepatomegaly. No rebound/guarding. No obvious abdominal masses. Abdominal aorta is normal size without bruit Extremities: 1+ edema. no cyanosis, no clubbing, no ulcers  Peripheral : 2+ bilateral upper extremity pulses, 2+ bilateral femoral pulses, 2+ bilateral dorsal pedal pulse Neuro: Alert and oriented. No facial asymmetry. No focal deficit. Moves all extremities spontaneously. Musculoskeletal: Normal muscle tone without kyphosis Psych:  Responds to questions appropriately with a normal affect.    Assessment: 58 year old male with mitral valve repair hypertension hyperlipidemia diabetes and persistent atrial fibrillation with rapid ventricular rate causing acute on chronic diastolic dysfunction congestive heart failure as well as other multifactorial issues including hypoxia currently hemodynamically stable without evidence of myocardial infarction  Plan: 1.  Continue supportive care of lung disease pulmonary toilet and hypoxia 2.  Continue diltiazem drip for hypertension control and treatment of atrial fibrillation with rapid ventricular rate 3.  Continue metoprolol and consider increasing dose of metoprolol for heart rate control and also addition of oral diltiazem at higher dose for heart rate control between 60 and 110 bpm 4.  Continue anticoagulation for further risk reduction in stroke with atrial fibrillation with a goal INR between 2 and 3 5.  After further control of her blood and improving of hypoxia would further adjust medication management  Signed, Corey Skains M.D. [facility] 11/18/2018, 4:30 PM

## 2018-11-19 ENCOUNTER — Encounter: Payer: Self-pay | Admitting: Primary Care

## 2018-11-19 ENCOUNTER — Inpatient Hospital Stay: Payer: Medicare Other

## 2018-11-19 DIAGNOSIS — Z7189 Other specified counseling: Secondary | ICD-10-CM

## 2018-11-19 DIAGNOSIS — J9602 Acute respiratory failure with hypercapnia: Secondary | ICD-10-CM

## 2018-11-19 DIAGNOSIS — Z515 Encounter for palliative care: Secondary | ICD-10-CM

## 2018-11-19 DIAGNOSIS — J9601 Acute respiratory failure with hypoxia: Secondary | ICD-10-CM

## 2018-11-19 DIAGNOSIS — I5023 Acute on chronic systolic (congestive) heart failure: Secondary | ICD-10-CM

## 2018-11-19 LAB — CULTURE, RESPIRATORY W GRAM STAIN: Culture: NORMAL

## 2018-11-19 LAB — CBC
HCT: 44.1 % (ref 39.0–52.0)
Hemoglobin: 14 g/dL (ref 13.0–17.0)
MCH: 30.1 pg (ref 26.0–34.0)
MCHC: 31.7 g/dL (ref 30.0–36.0)
MCV: 94.8 fL (ref 80.0–100.0)
Platelets: 164 10*3/uL (ref 150–400)
RBC: 4.65 MIL/uL (ref 4.22–5.81)
RDW: 13 % (ref 11.5–15.5)
WBC: 8.9 10*3/uL (ref 4.0–10.5)
nRBC: 0 % (ref 0.0–0.2)

## 2018-11-19 LAB — BASIC METABOLIC PANEL
Anion gap: 12 (ref 5–15)
BUN: 56 mg/dL — ABNORMAL HIGH (ref 6–20)
CO2: 29 mmol/L (ref 22–32)
Calcium: 8.7 mg/dL — ABNORMAL LOW (ref 8.9–10.3)
Chloride: 97 mmol/L — ABNORMAL LOW (ref 98–111)
Creatinine, Ser: 1.72 mg/dL — ABNORMAL HIGH (ref 0.61–1.24)
GFR calc Af Amer: 49 mL/min — ABNORMAL LOW (ref >60)
GFR calc non Af Amer: 43 mL/min — ABNORMAL LOW (ref >60)
Glucose, Bld: 198 mg/dL — ABNORMAL HIGH (ref 70–99)
Potassium: 4.8 mmol/L (ref 3.5–5.1)
Sodium: 138 mmol/L (ref 135–145)

## 2018-11-19 LAB — GLUCOSE, CAPILLARY
Glucose-Capillary: 154 mg/dL — ABNORMAL HIGH (ref 70–99)
Glucose-Capillary: 180 mg/dL — ABNORMAL HIGH (ref 70–99)
Glucose-Capillary: 189 mg/dL — ABNORMAL HIGH (ref 70–99)
Glucose-Capillary: 195 mg/dL — ABNORMAL HIGH (ref 70–99)
Glucose-Capillary: 202 mg/dL — ABNORMAL HIGH (ref 70–99)
Glucose-Capillary: 205 mg/dL — ABNORMAL HIGH (ref 70–99)
Glucose-Capillary: 211 mg/dL — ABNORMAL HIGH (ref 70–99)

## 2018-11-19 LAB — PROTIME-INR
INR: 3.6 — ABNORMAL HIGH (ref 0.8–1.2)
Prothrombin Time: 35.1 seconds — ABNORMAL HIGH (ref 11.4–15.2)

## 2018-11-19 MED ORDER — MIDAZOLAM HCL 2 MG/2ML IJ SOLN
4.0000 mg | Freq: Once | INTRAMUSCULAR | Status: AC
  Administered 2018-11-19: 4 mg via INTRAVENOUS

## 2018-11-19 MED ORDER — FENTANYL 2500MCG IN NS 250ML (10MCG/ML) PREMIX INFUSION
0.0000 ug/h | INTRAVENOUS | Status: DC
  Administered 2018-11-19 – 2018-11-20 (×4): 400 ug/h via INTRAVENOUS
  Filled 2018-11-19 (×3): qty 250

## 2018-11-19 MED ORDER — CHLORHEXIDINE GLUCONATE 0.12% ORAL RINSE (MEDLINE KIT)
15.0000 mL | Freq: Two times a day (BID) | OROMUCOSAL | Status: DC
  Administered 2018-11-19 – 2018-12-12 (×46): 15 mL via OROMUCOSAL

## 2018-11-19 MED ORDER — PROPOFOL 1000 MG/100ML IV EMUL
5.0000 ug/kg/min | INTRAVENOUS | Status: DC
  Administered 2018-11-19 – 2018-11-20 (×4): 30 ug/kg/min via INTRAVENOUS
  Administered 2018-11-20: 40 ug/kg/min via INTRAVENOUS
  Administered 2018-11-20: 30 ug/kg/min via INTRAVENOUS
  Administered 2018-11-20: 40 ug/kg/min via INTRAVENOUS
  Filled 2018-11-19 (×6): qty 100

## 2018-11-19 MED ORDER — FENTANYL CITRATE (PF) 100 MCG/2ML IJ SOLN
100.0000 ug | Freq: Once | INTRAMUSCULAR | Status: AC
  Administered 2018-11-19: 16:00:00 100 ug via INTRAVENOUS

## 2018-11-19 MED ORDER — VECURONIUM BROMIDE 10 MG IV SOLR
10.0000 mg | Freq: Once | INTRAVENOUS | Status: AC
  Administered 2018-11-19: 10 mg via INTRAVENOUS

## 2018-11-19 MED ORDER — FENTANYL 2500MCG IN NS 250ML (10MCG/ML) PREMIX INFUSION
INTRAVENOUS | Status: AC
  Administered 2018-11-19: 400 ug/h via INTRAVENOUS
  Filled 2018-11-19: qty 250

## 2018-11-19 MED ORDER — DIAZEPAM 5 MG/ML IJ SOLN
5.0000 mg | Freq: Once | INTRAMUSCULAR | Status: AC
  Administered 2018-11-19: 5 mg via INTRAVENOUS
  Filled 2018-11-19: qty 2

## 2018-11-19 MED ORDER — METHYLPREDNISOLONE SODIUM SUCC 40 MG IJ SOLR
40.0000 mg | Freq: Two times a day (BID) | INTRAMUSCULAR | Status: DC
  Administered 2018-11-19 – 2018-11-30 (×22): 40 mg via INTRAVENOUS
  Filled 2018-11-19 (×22): qty 1

## 2018-11-19 MED ORDER — MORPHINE SULFATE (PF) 2 MG/ML IV SOLN
2.0000 mg | Freq: Once | INTRAVENOUS | Status: AC
  Administered 2018-11-19: 15:00:00 2 mg via INTRAVENOUS

## 2018-11-19 MED ORDER — PROPOFOL 1000 MG/100ML IV EMUL
INTRAVENOUS | Status: AC
  Administered 2018-11-19: 30 ug/kg/min via INTRAVENOUS
  Filled 2018-11-19: qty 100

## 2018-11-19 MED ORDER — MORPHINE SULFATE (PF) 2 MG/ML IV SOLN
INTRAVENOUS | Status: AC
  Administered 2018-11-19: 2 mg via INTRAVENOUS
  Filled 2018-11-19: qty 1

## 2018-11-19 MED ORDER — ORAL CARE MOUTH RINSE
15.0000 mL | OROMUCOSAL | Status: DC
  Administered 2018-11-19 – 2018-12-12 (×220): 15 mL via OROMUCOSAL

## 2018-11-19 MED ORDER — FENTANYL CITRATE (PF) 100 MCG/2ML IJ SOLN
INTRAMUSCULAR | Status: AC
  Administered 2018-11-19: 100 ug via INTRAVENOUS
  Filled 2018-11-19: qty 2

## 2018-11-19 NOTE — Procedures (Signed)
Endotracheal Intubation: Patient required placement of an artificial airway secondary to Respiratory Failure  Consent: Emergent.   Hand washing performed prior to starting the procedure.   Medications administered for sedation prior to procedure:  Midazolam 4 mg IV,  Vecuronium 10 mg IV, Fentanyl 100 mcg IV.    A time out procedure was called and correct patient, name, & ID confirmed. Needed supplies and equipment were assembled and checked to include ETT, 10 ml syringe, Glidescope, Mac and Miller blades, suction, oxygen and bag mask valve, end tidal CO2 monitor.   Patient was positioned to align the mouth and pharynx to facilitate visualization of the glottis.   Heart rate, SpO2 and blood pressure was continuously monitored during the procedure. Pre-oxygenation was conducted prior to intubation and endotracheal tube was placed through the vocal cords into the trachea.     The artificial airway was placed under direct visualization via glidescope route using a 8.0 ETT on the first attempt.  ETT was secured at 23 cm mark.  Placement was confirmed by auscuitation of lungs with good breath sounds bilaterally and no stomach sounds.  Condensation was noted on endotracheal tube.   Pulse ox 98%.  CO2 detector in place with appropriate color change.   Complications: None .   Operator: Cherry [place].   Chest radiograph ordered and pending.    Corrin Parker, M.D.  [facility]  Medical Director [place] Director [facility] Cardio-Pulmonary Department

## 2018-11-19 NOTE — Progress Notes (Signed)
PT Cancellation Note  Patient Details Name: Kyle White MRN: [id number] DOB: 1960/09/06   Cancelled Treatment:    Reason Eval/Treat Not Completed: Patient not medically ready.  PT consult received.  Chart reviewed.  Pt noted to be currently on BiPAP s/p extubation.  Will hold PT at this time and re-attempt PT evaluation at a later date/time as medically appropriate.  Leitha Bleak, PT 11/19/18, 9:13 AM [phone]

## 2018-11-19 NOTE — Consult Note (Signed)
Consultation Note Date: 11/19/2018   Patient Name: Kyle White  DOB: [birth year]  MRN: [id number]  Age / Sex: 58 y.o., male  PCP: [facility] [place] Referring Physician: Max Sane, MD  Reason for Consultation: Establishing goals of care  HPI/Patient Profile: 58 y.o. male  with past medical history of morbid obesity, obstructive sleep apnea, A. fib, CHF, mitral valve repair hypertension, type 2 diabetes, gout, admitted on 11/12/2018 with acute on chronic hypoxic respiratory failure, extubated 8/31.   Clinical Assessment and Goals of Care: Mr. Boldt is lying quietly in bed.  He will briefly open his eyes, briefly making eye contact.  He is able to tell me his name, and that we are in the hospital.  There is no family at bedside at this time.  I asked Mr. Vogt he would speak for him if he could not, he names "Pamala Hurry, my roommate".  Legally his next of kin would be his surrogate decision makers.  Goes by "Hollice Espy".   Call to sister, [name], at [phone].  Left generic VM message.   Call to brother, Chao Wiltgen, at [phone]. We talk about HCPOA, see below.  Gwyndolyn Saxon shares that [name] is closer and younger, so she usually takes the lead.   We talk about life support, and Gwyndolyn Saxon tells me, "he's never really said"  about life support.  We talked about the concept of treat the treatable, but allowing natural death.  We talked in detail about Linden's acute and chronic health problems including but not limited to morbid obesity, heart failure, obesity hypoventilation syndrome. We also talk about Barnabas's life at home.  Gwyndolyn Saxon states that Liad has been on disability since approximately September 2016.  He states that his disability is that he "needs oxygen all the time".  Gwyndolyn Saxon shares that [employer] health problems started at approximately 2011 after a heart valve (mitral) surgery.   Gwyndolyn Saxon shares that Ilan was out of work after surgery and "put on lots of weight".  I share that Omarian now weighs 380 pounds. Gwyndolyn Saxon shares that [place] girlfriend, Pamala Hurry, has been getting groceries and taking care of household chores, Noberto is not going out much.    We talk Declan's acute and chronic health concerns.  We talked about CODE STATUS including "treat the treatable, but allowing natural death".  I encourage Gwyndolyn Saxon to reach out to [place] to discuss what is next for Hubbell.  PMT to follow-up with a phone call to [facility] tomorrow.  HCPOA   NEXT OF KIN - brother Nickalaus Sherfield, sister [name].  Les is not married, he has no children.  There are no other siblings.    SUMMARY OF RECOMMENDATIONS   At this point continue to treat the treatable Continue CODE STATUS discussions  Code Status/Advance Care Planning:  Full code -brother Gwyndolyn Saxon states that Chozen had not talked about life support choices in the past. Gwyndolyn Saxon and [name] are to discuss choices amongst themselves tonight.   Symptom Management:   Per hospitalist/CCM, no additional  needs at this time.  Palliative Prophylaxis:   Aspiration and Turn Reposition  Additional Recommendations (Limitations, Scope, Preferences):  Full Scope Treatment  Psycho-social/Spiritual:   Desire for further Chaplaincy support:no  Additional Recommendations: Caregiving  Support/Resources and Education on Hospice  Prognosis:   Unable to determine, based on outcomes.  Discharge Planning: To be determined, based on outcomes.      Primary Diagnoses: Present on Admission: **None**   I have reviewed the medical record, interviewed the patient and family, and examined the patient. The following aspects are pertinent.  Past Medical History:  Diagnosis Date  . Atrial fibrillation ([place])   . CHF (congestive heart failure) ([place])   . Diabetes mellitus without complication ([place])   . Gout   . Hypertension associated  with type 2 diabetes mellitus ([place])   . Morbid obesity ([place])   . Obstructive sleep apnea    Social History   Socioeconomic History  . Marital status: Unknown    Spouse name: Not on file  . Number of children: Not on file  . Years of education: Not on file  . Highest education level: Not on file  Occupational History  . Not on file  Social Needs  . Financial resource strain: Not on file  . Food insecurity    Worry: Not on file    Inability: Not on file  . Transportation needs    Medical: Not on file    Non-medical: Not on file  Tobacco Use  . Smoking status: Never Smoker  . Smokeless tobacco: Never Used  Substance and Sexual Activity  . Alcohol use: Never    Frequency: Never  . Drug use: Never  . Sexual activity: Not on file  Lifestyle  . Physical activity    Days per week: Not on file    Minutes per session: Not on file  . Stress: Not on file  Relationships  . Social Herbalist on phone: Not on file    Gets together: Not on file    Attends religious service: Not on file    Active member of club or organization: Not on file    Attends meetings of clubs or organizations: Not on file    Relationship status: Not on file  Other Topics Concern  . Not on file  Social History Narrative  . Not on file   History reviewed. No pertinent family history. Scheduled Meds: . allopurinol  100 mg Oral Daily  . aspirin  81 mg Per NG tube Daily  . chlorhexidine  15 mL Mouth Rinse BID  . Chlorhexidine Gluconate Cloth  6 each Topical Daily  . diltiazem  240 mg Oral Daily  . insulin aspart  0-15 Units Subcutaneous Q4H  . ipratropium-albuterol  3 mL Nebulization Q4H  . lisinopril  5 mg Per NG tube Daily  . mouth rinse  15 mL Mouth Rinse q12n4p  . methylPREDNISolone (SOLU-MEDROL) injection  40 mg Intravenous BID  . metoprolol tartrate  50 mg Per NG tube BID  . pantoprazole sodium  40 mg Per Tube QHS  . potassium chloride  40 mEq Per Tube Once  . pravastatin  80 mg Per  NG tube QPM   Continuous Infusions: . dexmedetomidine (PRECEDEX) IV infusion 1.2 mcg/kg/hr (11/19/18 1200)  . diltiazem (CARDIZEM) infusion 6 mg/hr (11/19/18 1200)   PRN Meds:.acetaminophen, bisacodyl, midazolam, ondansetron (ZOFRAN) IV, sennosides, sodium chloride flush, vecuronium Medications Prior to Admission:  Prior to Admission medications   Medication Sig Start Date End  Date Taking? Authorizing Provider  aspirin EC 81 MG tablet Take 81 mg by mouth daily.   Yes [provider]  colchicine 0.6 MG tablet Take 12 mg by mouth. At first sing of gout flare   Yes [provider]  diltiazem (CARDIZEM CD) 240 MG 24 hr capsule Take 240 mg by mouth daily.   Yes [provider]  furosemide (LASIX) 40 MG tablet Take 40 mg by mouth 2 (two) times daily.   Yes [provider]  lisinopril (ZESTRIL) 5 MG tablet Take 5 mg by mouth daily.   Yes [provider]  metFORMIN (GLUCOPHAGE) 500 MG tablet Take 500 mg by mouth 2 (two) times daily with a meal.   Yes [provider]  metoprolol tartrate (LOPRESSOR) 100 MG tablet Take 50 mg by mouth 2 (two) times daily.   Yes [provider]  pravastatin (PRAVACHOL) 80 MG tablet Take 80 mg by mouth every evening.   Yes [provider]  warfarin (COUMADIN) 4 MG tablet Take 2.5-4 mg by mouth daily. Take 4 mg on Sunday, Tuesday, Wednesday, Thursday, and Saturday. Take 2.5 mg on Monday and Friday   Yes [provider]   No Known Allergies Review of Systems  Physical Exam  Vital Signs: BP (!) 125/92   Pulse (!) 116   Temp (!) 97.3 F (36.3 C) (Axillary)   Resp 20   Ht 5\' 9"  (1.753 m)   Wt (!) 172.7 kg   SpO2 96%   BMI 56.22 kg/m  Pain Scale: 0-10 POSS *See Group Information*: 2-Acceptable,Slightly drowsy, easily aroused Pain Score: 0-No pain   SpO2: SpO2: 96 % O2 Device:SpO2: 96 % O2 Flow Rate: .O2 Flow Rate (L/min): 3 L/min  IO: Intake/output summary:   Intake/Output  Summary (Last 24 hours) at 11/19/2018 1316 Last data filed at 11/19/2018 1200 Gross per 24 hour  Intake 1164.81 ml  Output 1910 ml  Net -745.19 ml    LBM: Last BM Date: (unknown, prior to admit) Baseline Weight: Weight: (!) 158 kg Most recent weight: Weight: (!) 172.7 kg     Palliative Assessment/Data:   Flowsheet Rows     Most Recent Value  Intake Tab  Referral Department  Hospitalist  Unit at Time of Referral  ICU  Palliative Care Primary Diagnosis  Cardiac  Date Notified  11/04/2018  Palliative Care Type  New Palliative care  Reason for referral  Clarify Goals of Care  Date of Admission  11/15/2018  Date first seen by Palliative Care  11/19/18  # of days Palliative referral response time  3 Day(s)  # of days IP prior to Palliative referral  0  Clinical Assessment  Palliative Performance Scale Score  30%  Pain Max last 24 hours  Not able to report  Pain Min Last 24 hours  Not able to report  Dyspnea Max Last 24 Hours  Not able to report  Dyspnea Min Last 24 hours  Not able to report  Psychosocial & Spiritual Assessment  Palliative Care Outcomes      Time In: 1120 Time Out: 1210 Time Total: 50 minutes Greater than 50%  of this time was spent counseling and coordinating care related to the above assessment and plan.  Signed by: Drue Novel, NP   Please contact Palliative Medicine Team phone at [phone] for questions and concerns.  For individual provider: See Shea Evans

## 2018-11-19 NOTE — Progress Notes (Signed)
Chart reviewed. Pt lethargic and confused. Nsg reports Pt is not appropriate for swallow eval. Pt requesting pepsi. Encouraged nsg not to give pepsi but to only try ice chips first if Pt is alert enough to participate. ST to follow up 9/1

## 2018-11-19 NOTE — Progress Notes (Signed)
[facility] [place] NAME: Kyle White    MR#:  [id number]  DATE OF BIRTH:  1960/10/27  SUBJECTIVE:  CHIEF COMPLAINT:   Chief Complaint  Patient presents with  . Shortness of Breath  . Weakness  Extubated earlier, now on BiPAP REVIEW OF SYSTEMS:  ROS: unable to obtain as on BiPAP DRUG ALLERGIES:  No Known Allergies VITALS:  Blood pressure (!) 125/92, pulse (!) 116, temperature (!) 97.3 F (36.3 C), temperature source Axillary, resp. rate 20, height 5\' 9"  (1.753 m), weight (!) 172.7 kg, SpO2 96 %. PHYSICAL EXAMINATION:  Physical Exam Constitutional:      General: He is sleeping.  HENT:     Head: Normocephalic and atraumatic.     Comments: On BiPAP Eyes:     Conjunctiva/sclera: Conjunctivae normal.     Pupils: Pupils are equal, round, and reactive to light.  Neck:     Musculoskeletal: Normal range of motion and neck supple.     Thyroid: No thyromegaly.     Trachea: No tracheal deviation.  Cardiovascular:     Rate and Rhythm: Normal rate and regular rhythm.     Heart sounds: Normal heart sounds.  Pulmonary:     Effort: Pulmonary effort is normal. No respiratory distress.     Breath sounds: Normal breath sounds. No wheezing.  Chest:     Chest wall: No tenderness.  Abdominal:     General: Bowel sounds are decreased. There is no distension.     Palpations: Abdomen is rigid.     Tenderness: There is no abdominal tenderness.  Musculoskeletal: Normal range of motion.  Skin:    General: Skin is warm and dry.     Findings: No rash.  Neurological:     Cranial Nerves: No cranial nerve deficit.     Comments: On BiPAP  Psychiatric:     Comments: Difficult to evaluate as he is on BiPAP    LABORATORY PANEL:  Male CBC Recent Labs  Lab 11/19/18 0514  WBC 8.9  HGB 14.0  HCT 44.1  PLT 164   ------------------------------------------------------------------------------------------------------------------ Chemistries   Recent Labs  Lab 11/17/18 0029  11/18/18 0501 11/19/18 0514  NA 136   < > 138 138  K 4.8   < > 3.9 4.8  CL 94*   < > 96* 97*  CO2 31   < > 29 29  GLUCOSE 172*   < > 210* 198*  BUN 34*   < > 41* 56*  CREATININE 1.28*   < > 1.36* 1.72*  CALCIUM 8.7*   < > 8.7* 8.7*  MG 2.6*  --  2.5*  --   AST 20  --   --   --   ALT 25  --   --   --   ALKPHOS 64  --   --   --   BILITOT 1.0  --   --   --    < > = values in this interval not displayed.   RADIOLOGY:  No results found. ASSESSMENT AND PLAN:   1.  Acute on chronic hypoxic respiratory failure -  Extubated earlier today now on BiPAP.  May need reintubation, further decision per PCCM team  2.  Obstructive sleep apnea  3.  Acute on chronic diastolic CHF, EF 55 to [id number] on echo in 2014 at [facility] - Repeat echocardiogram shows EF of 55 to 60%  4.  History of atrial fibrillation - Continue Coumadin -  INR 3.6  5.  Acute renal failure with creatinine 1. 72 - Repeat BMP in the a.m. and continue to monitor renal function closely  6.  Type II Diabetes mellitus - Sliding scale insulin -Hemoglobin A1c 7.3  7.  Generalized weakness -Physical therapy eval once extubated  8.  Abdominal distention -No acute pathology on CT scan or KUB performed yesterday -Could be ileus, NG tube in place  PPI prophylaxis     All the records are reviewed and case discussed with Care Management/Social Worker. Management plans discussed with the patient, nursing and they are in agreement.  CODE STATUS: Full Code  TOTAL TIME TAKING CARE OF THIS PATIENT: 15 minutes.   More than 50% of the time was spent in counseling/coordination of care: YES  POSSIBLE D/C IN 3-4 DAYS, DEPENDING ON CLINICAL CONDITION.   Max Sane M.D on 11/19/2018 at 1:05 PM  Between 7am to 6pm - Pager - [phone]  After 6pm go to [email or web address] - Technical brewer [facility]  Office  [phone]  CC: Primary care physician; [facility],  [employer]  Note: This dictation was prepared with Diplomatic Services operational officer dictation along with smaller Company secretary. Any transcriptional errors that result from this process are unintentional.

## 2018-11-19 NOTE — Progress Notes (Signed)
CRITICAL CARE NOTE  58 yo w/hx of morbid obesity, HFpEF hx MVR, CHF, PAF, OSA, DM, AHRF came in with acute hypoxemic hypercarbic resp failure with CXR showing b/l pl effusions and pulm edema.  CT with restrictive physiology,  pulm edema, bilateral pleural effusions and compressive atelectasis s/p extubation on bIPAP  CC  follow up respiratory failure  SUBJECTIVE Patient remains critically ill Prognosis is guarded On biPAP Obtunded Progressive renal failure    BP (!) 142/98   Pulse 68   Temp 98.2 F (36.8 C) (Axillary)   Resp 11   Ht 5\' 9"  (1.753 m)   Wt (!) 172.7 kg   SpO2 91%   BMI 56.22 kg/m    I/O last 3 completed shifts: In: 1935.7 [I.V.:1935.7] Out: [id number] [Urine:3060; Emesis/NG output:200] No intake/output data recorded.  SpO2: 91 % O2 Flow Rate (L/min): 6 L/min FiO2 (%): 45 %  CBC    Component Value Date/Time   WBC 8.9 11/19/2018 0514   RBC 4.65 11/19/2018 0514   HGB 14.0 11/19/2018 0514   HCT 44.1 11/19/2018 0514   PLT 164 11/19/2018 0514   MCV 94.8 11/19/2018 0514   MCH 30.1 11/19/2018 0514   MCHC 31.7 11/19/2018 0514   RDW 13.0 11/19/2018 0514   LYMPHSABS 0.9 11/17/2018 0029   MONOABS 1.1 (H) 11/17/2018 0029   EOSABS 0.0 11/17/2018 0029   BASOSABS 0.0 11/17/2018 0029   BMP Latest Ref Rng & Units 11/19/2018 11/18/2018 11/17/2018  Glucose 70 - 99 mg/dL 198(H) 210(H) 222(H)  BUN 6 - 20 mg/dL 56(H) 41(H) 35(H)  Creatinine 0.61 - 1.24 mg/dL 1.72(H) 1.36(H) 1.35(H)  Sodium 135 - 145 mmol/L 138 138 135  Potassium 3.5 - 5.1 mmol/L 4.8 3.9 5.1  Chloride 98 - 111 mmol/L 97(L) 96(L) 94(L)  CO2 22 - 32 mmol/L 29 29 30   Calcium 8.9 - 10.3 mg/dL 8.7(L) 8.7(L) 8.7(L)     SIGNIFICANT EVENTS   REVIEW OF SYSTEMS  [name] UNABLE TO PROVIDE COMPLETE REVIEW OF SYSTEMS DUE TO SEVERE CRITICAL ILLNESS   PHYSICAL EXAMINATION:  GENERAL:critically ill appearing, +resp distress HEAD: Normocephalic, atraumatic.  EYES: Pupils equal, round, reactive to light.  No  scleral icterus.  MOUTH: Moist mucosal membrane. NECK: Supple.  PULMONARY: +rhonchi, +wheezing CARDIOVASCULAR: S1 and S2. Regular rate and rhythm. No murmurs, rubs, or gallops.  GASTROINTESTINAL: +distended. MUSCULOSKELETAL: No swelling, clubbing, or edema.  NEUROLOGIC: obtunded,  SKIN:intact,warm,dry  MEDICATIONS: I have reviewed all medications and confirmed regimen as documented   CULTURE RESULTS   Recent Results (from the past 240 hour(s))  SARS CORONAVIRUS 2 (TAT 6-12 HRS) Nasal Swab Aptima Multi Swab     Status: None   Collection Time: 10/31/2018  4:55 PM   Specimen: Aptima Multi Swab; Nasal Swab  Result Value Ref Range Status   SARS Coronavirus 2 NEGATIVE NEGATIVE Final    Comment: (NOTE) SARS-CoV-2 target nucleic acids are NOT DETECTED. The SARS-CoV-2 RNA is generally detectable in upper and lower respiratory specimens during the acute phase of infection. Negative results do not preclude SARS-CoV-2 infection, do not rule out co-infections with other pathogens, and should not be used as the sole basis for treatment or other patient management decisions. Negative results must be combined with clinical observations, patient history, and epidemiological information. The expected result is Negative. Fact Sheet for Patients: SugarRoll.be Fact Sheet for Healthcare Providers: https://www.woods-mathews.com/ This test is not yet approved or cleared by the [place] FDA and  has been authorized for detection and/or diagnosis of SARS-CoV-2  by FDA under an Emergency Use Authorization (EUA). This EUA will remain  in effect (meaning this test can be used) for the duration of the COVID-19 declaration under Section 56 4(b)(1) of the Act, 21 U.S.C. section 360bbb-3(b)(1), unless the authorization is terminated or revoked sooner. Performed at [facility], [place] [address] [place], [place] [address]   SARS Coronavirus 2 [facility] order,  Performed in [facility] hospital lab) Nasopharyngeal Nasopharyngeal Swab     Status: None   Collection Time: 11/17/2018 10:34 PM   Specimen: Nasopharyngeal Swab  Result Value Ref Range Status   SARS Coronavirus 2 NEGATIVE NEGATIVE Final    Comment: (NOTE) If result is NEGATIVE SARS-CoV-2 target nucleic acids are NOT DETECTED. The SARS-CoV-2 RNA is generally detectable in upper and lower  respiratory specimens during the acute phase of infection. The lowest  concentration of SARS-CoV-2 viral copies this assay can detect is 250  copies / mL. A negative result does not preclude SARS-CoV-2 infection  and should not be used as the sole basis for treatment or other  patient management decisions.  A negative result may occur with  improper specimen collection / handling, submission of specimen other  than nasopharyngeal swab, presence of viral mutation(s) within the  areas targeted by this assay, and inadequate number of viral copies  (<250 copies / mL). A negative result must be combined with clinical  observations, patient history, and epidemiological information. If result is POSITIVE SARS-CoV-2 target nucleic acids are DETECTED. The SARS-CoV-2 RNA is generally detectable in upper and lower  respiratory specimens dur ing the acute phase of infection.  Positive  results are indicative of active infection with SARS-CoV-2.  Clinical  correlation with patient history and other diagnostic information is  necessary to determine patient infection status.  Positive results do  not rule out bacterial infection or co-infection with other viruses. If result is PRESUMPTIVE POSTIVE SARS-CoV-2 nucleic acids MAY BE PRESENT.   A presumptive positive result was obtained on the submitted specimen  and confirmed on repeat testing.  While 2019 novel coronavirus  (SARS-CoV-2) nucleic acids may be present in the submitted sample  additional confirmatory testing may be necessary for epidemiological  and / or  clinical management purposes  to differentiate between  SARS-CoV-2 and other Sarbecovirus currently known to infect humans.  If clinically indicated additional testing with an alternate test  methodology [phone]) is advised. The SARS-CoV-2 RNA is generally  detectable in upper and lower respiratory sp ecimens during the acute  phase of infection. The expected result is Negative. Fact Sheet for Patients:  StrictlyIdeas.no Fact Sheet for Healthcare Providers: BankingDealers.co.za This test is not yet approved or cleared by the [place] FDA and has been authorized for detection and/or diagnosis of SARS-CoV-2 by FDA under an Emergency Use Authorization (EUA).  This EUA will remain in effect (meaning this test can be used) for the duration of the COVID-19 declaration under Section 564(b)(1) of the Act, 21 U.S.C. section 360bbb-3(b)(1), unless the authorization is terminated or revoked sooner. Performed at [facility], [address] [place], [place] [address]   Culture, respiratory (non-expectorated)     Status: None (Preliminary result)   Collection Time: 10/23/2018 10:34 PM   Specimen: Tracheal Aspirate; Respiratory  Result Value Ref Range Status   Specimen Description   Final    TRACHEAL ASPIRATE Performed at [facility], [address], [place], [place] [address]    Special Requests   Final    NONE  Performed at [facility], [address] [place], [place] [address]    Gram Stain   Final    RARE WBC PRESENT, PREDOMINANTLY PMN ABUNDANT GRAM POSITIVE COCCI IN CHAINS    Culture   Final    TOO YOUNG TO READ Performed at [facility], [place] [address] [place], [place] [address]    Report Status PENDING  Incomplete  Urine Culture     Status: None   Collection Time: 10/22/2018 11:12 PM   Specimen: Urine, Random  Result Value Ref Range Status   Specimen Description   Final    URINE,  RANDOM Performed at [facility], [address] [place], [place] [address]    Special Requests   Final    NONE Performed at [facility], [address], [place], [place] [address]    Culture   Final    NO GROWTH Performed at [facility], [place] [address], [place], [place] [address]    Report Status 11/18/2018 FINAL  Final  MRSA PCR Screening     Status: None   Collection Time: 11/13/2018 11:28 PM   Specimen: Nasopharyngeal  Result Value Ref Range Status   MRSA by PCR NEGATIVE NEGATIVE Final    Comment:        The GeneXpert MRSA Assay (FDA approved for NASAL specimens only), is one component of a comprehensive MRSA colonization surveillance program. It is not intended to diagnose MRSA infection nor to guide or monitor treatment for MRSA infections. Performed at [facility], [address] [place], [place] [address]   Culture, blood (Routine X 2) w Reflex to ID Panel     Status: None (Preliminary result)   Collection Time: 11/17/18 12:19 AM   Specimen: BLOOD  Result Value Ref Range Status   Specimen Description BLOOD RIGHT ANTECUBITAL  Final   Special Requests   Final    BOTTLES DRAWN AEROBIC AND ANAEROBIC Blood Culture adequate volume   Culture   Final    NO GROWTH 1 DAY Performed at [facility], [address], [place], [place] [address]    Report Status PENDING  Incomplete  Culture, blood (Routine X 2) w Reflex to ID Panel     Status: None (Preliminary result)   Collection Time: 11/17/18 12:31 AM   Specimen: BLOOD  Result Value Ref Range Status   Specimen Description BLOOD CENTRAL LINE MIDLINE  Final   Special Requests   Final    BOTTLES DRAWN AEROBIC AND ANAEROBIC Blood Culture adequate volume   Culture   Final    NO GROWTH 1 DAY Performed at [facility], [address], [place],  [address]    Report Status PENDING  Incomplete           Indwelling Urinary Catheter continued, requirement due  to   Reason to continue Indwelling Urinary Catheter strict Intake/Output monitoring for hemodynamic instability   Central Line/ continued, requirement due to  Reason to continue [place] of central venous pressure or other hemodynamic parameters and poor IV access   Ventilator continued, requirement due to severe respiratory failure   Ventilator Sedation RASS 0 to -2      ASSESSMENT AND PLAN SYNOPSIS   Severe ACUTE Hypoxic and Hypercapnic Respiratory Failure from pulm edema HFpEF with effusions and progressive renal failure High risk for intubation On biPAP  ACUTE SYSTOLIC CARDIAC FAILURE- HFpEF -oxygen as needed -Lasix as tolerated -follow up cardiac enzymes as indicated  ACUTE KIDNEY INJURY/Renal Failure -follow chem 7 -follow UO -continue Foley Catheter-assess need -Avoid nephrotoxic agents -Recheck creatinine    NEUROLOGY obtunded  CARDIAC ICU monitoring  ID -continue IV abx as prescibed -follow up cultures  GI GI PROPHYLAXIS as indicated  NUTRITIONAL STATUS DIET-->NPO Constipation protocol as indicated  ENDO - will use ICU hypoglycemic\Hyperglycemia protocol if indicated   ELECTROLYTES -follow labs as needed -replace as needed -pharmacy consultation and following   DVT/GI PRX ordered TRANSFUSIONS AS NEEDED MONITOR FSBS ASSESS the need for LABS as needed   Critical Care Time devoted to patient care services described in this note is 32 minutes.   Overall, patient is critically ill, prognosis is guarded.  Patient with Multiorgan failure and at high risk for cardiac arrest and death.    Corrin Parker, M.D.  [facility]  Medical Director [place] Director [facility] Cardio-Pulmonary Department

## 2018-11-19 NOTE — Progress Notes (Signed)
OT Cancellation Note  Patient Details Name: Kyle White MRN: [id number] DOB: September 08, 1960   Cancelled Treatment:    Reason Eval/Treat Not Completed: [name] level of consciousness;Patient not medically ready. Consult received, chart reviewed. Per chart, pt on BiPAP s/p extubation and obtunded this am. Will hold OT evaluation this date and re-attempt at later date/time as pt is medically appropriate.   Jeni Salles, MPH, MS, OTR/L ascom [phone] 11/19/18, 8:28 AM

## 2018-11-19 NOTE — Progress Notes (Signed)
[facility] Encounter Note  Patient: Kyle White / Admit Date: 11/06/2018 / Date of Encounter: 11/19/2018, 12:56 PM   Subjective: Patient unable to communicate well at this time with some confusion.  No evidence of significant worsening of shortness of breath although slightly improved with BiPAP recently taken off this morning.  Atrial fibrillation with continued rapid rate although well controlled at this time with intravenous diltiazem and will take oral diltiazem when able.  No evidence of myocardial infarction at this time  Review of Systems: Positive for: Shortness of breath Negative for: Vision change, hearing change, syncope, dizziness, nausea, vomiting,diarrhea, bloody stool, stomach pain, cough, congestion, diaphoresis, urinary frequency, urinary pain,skin lesions, skin rashes Others previously listed  Objective: Telemetry: Atrial fibrillation with rapid ventricular rate Physical Exam: Blood pressure (!) 125/92, pulse (!) 116, temperature (!) 97.3 F (36.3 C), temperature source Axillary, resp. rate 20, height 5\' 9"  (1.753 m), weight (!) 172.7 kg, SpO2 96 %. Body mass index is 56.22 kg/m. General: Well developed, well nourished, in no acute distress. Head: Normocephalic, atraumatic, sclera non-icteric, no xanthomas, nares are without discharge. Neck: No apparent masses Lungs: Normal respirations with few use wheezes, some rhonchi, no rales , no crackles   Heart: Irregular rate and rhythm, normal S1 S2, no murmur, no rub, no gallop, PMI is normal size and placement, carotid upstroke normal without bruit, jugular venous pressure normal Abdomen: Soft, non-tender,  distended with normoactive bowel sounds. No hepatosplenomegaly. Abdominal aorta is normal size without bruit Extremities: 1+ edema, no clubbing, no cyanosis, no ulcers,  Peripheral: 2+ radial, 2+ femoral, 2+ dorsal pedal pulses Neuro: Alert and oriented. Moves all extremities spontaneously. Psych:   Responds to questions appropriately with a normal affect.   Intake/Output Summary (Last 24 hours) at 11/19/2018 1256 Last data filed at 11/19/2018 1200 Gross per 24 hour  Intake 1264.81 ml  Output 1910 ml  Net -645.19 ml    Inpatient Medications:  . allopurinol  100 mg Oral Daily  . aspirin  81 mg Per NG tube Daily  . chlorhexidine  15 mL Mouth Rinse BID  . Chlorhexidine Gluconate Cloth  6 each Topical Daily  . diltiazem  240 mg Oral Daily  . insulin aspart  0-15 Units Subcutaneous Q4H  . ipratropium-albuterol  3 mL Nebulization Q4H  . lisinopril  5 mg Per NG tube Daily  . mouth rinse  15 mL Mouth Rinse q12n4p  . methylPREDNISolone (SOLU-MEDROL) injection  40 mg Intravenous BID  . metoprolol tartrate  50 mg Per NG tube BID  . pantoprazole sodium  40 mg Per Tube QHS  . potassium chloride  40 mEq Per Tube Once  . pravastatin  80 mg Per NG tube QPM   Infusions:  . dexmedetomidine (PRECEDEX) IV infusion 1.2 mcg/kg/hr (11/19/18 1200)  . diltiazem (CARDIZEM) infusion 6 mg/hr (11/19/18 1200)    Labs: Recent Labs    11/17/18 0029  11/18/18 0501 11/19/18 0514  NA 136   < > 138 138  K 4.8   < > 3.9 4.8  CL 94*   < > 96* 97*  CO2 31   < > 29 29  GLUCOSE 172*   < > 210* 198*  BUN 34*   < > 41* 56*  CREATININE 1.28*   < > 1.36* 1.72*  CALCIUM 8.7*   < > 8.7* 8.7*  MG 2.6*  --  2.5*  --   PHOS 5.0*  --  3.8  --    < > =  values in this interval not displayed.   Recent Labs    10/27/2018 1655 11/17/18 0029  AST 23 20  ALT 29 25  ALKPHOS 67 64  BILITOT 0.5 1.0  PROT 6.9 6.6  ALBUMIN 3.6 3.5   Recent Labs    10/25/2018 1655 11/17/18 0029 11/18/18 0501 11/19/18 0514  WBC 7.5 7.7 6.1 8.9  NEUTROABS 5.7 5.7  --   --   HGB 13.6 13.6 13.3 14.0  HCT 44.2 43.1 40.9 44.1  MCV 98.0 96.4 92.7 94.8  PLT 192 166 155 164   No results for input(s): CKTOTAL, CKMB, TROPONINI in the last 72 hours. Invalid input(s): POCBNP Recent Labs    11/17/18 0030  HGBA1C 7.3*      Weights: Filed Weights   11/10/2018 2135 11/17/18 0420 11/18/18 0500  Weight: (!) 173 kg (!) 173.2 kg (!) 172.7 kg     Radiology/Studies:  Ct Abdomen Pelvis Wo Contrast  Result Date: 11/17/2018 CLINICAL DATA:  Abdominal rigidity. Concern for perforated viscus. Currently intubated. EXAM: CT ABDOMEN AND PELVIS WITHOUT CONTRAST TECHNIQUE: Multidetector CT imaging of the abdomen and pelvis was performed following the standard protocol without IV contrast. COMPARISON:  None. FINDINGS: Lower chest: Small right and trace left pleural effusions with bilateral lower lobe airspace disease. Small amount of herniated right middle lobe between the fifth and sixth ribs. Hepatobiliary: Severe hepatic steatosis. Several small gallstones. No gallbladder wall thickening or biliary dilatation. Pancreas: Unremarkable. No pancreatic ductal dilatation or surrounding inflammatory changes. Spleen: Normal in size without focal abnormality. Adrenals/Urinary Tract: Adrenal glands are unremarkable. Punctate calculus in the lower pole the left kidney. No hydronephrosis. Bladder is decompressed by Foley catheter. Stomach/Bowel: Enteric tube tip in the distal stomach. Stomach is within normal limits. Appendix appears normal. No evidence of bowel wall thickening, distention, or inflammatory changes. Vascular/Lymphatic: Aortic atherosclerosis. No enlarged abdominal or pelvic lymph nodes. Reproductive: Prostate is unremarkable. Other: Trace ascites.  No pneumoperitoneum. Musculoskeletal: No acute or significant osseous findings. IMPRESSION: 1.  No acute intra-abdominal process.  No perforation. 2. Trace ascites. 3. Severe hepatic steatosis. 4. Cholelithiasis. 5. Punctate nonobstructive left nephrolithiasis. 6. Small right and trace left pleural effusions. Bilateral lower lobe airspace disease may represent atelectasis, but pneumonia is difficult to exclude, particularly on the right. Electronically Signed   By: Titus Dubin M.D.   On:  11/17/2018 13:50   Dg Abd 1 View  Result Date: 11/17/2018 CLINICAL DATA:  Abdomen distension tube placement EXAM: ABDOMEN - 1 VIEW COMPARISON:  10/27/2018 FINDINGS: Esophageal tube tip visible to the distal stomach but incompletely included. Airspace disease at the right base. IMPRESSION: Esophageal tubing visible to the gastric body but tip is incompletely included in the field of view. Electronically Signed   By: Donavan Foil M.D.   On: 11/17/2018 00:10   Dg Abd 1 View  Result Date: 11/05/2018 CLINICAL DATA:  Abdominal distention EXAM: ABDOMEN - 1 VIEW COMPARISON:  None. FINDINGS: Limited study due to portable nature of the study and body habitus. I see no significant bowel dilatation or free air. Study otherwise limited. IMPRESSION: Very limited study. No visible changes of bowel obstruction or free air. Electronically Signed   By: Rolm Baptise M.D.   On: 11/18/2018 22:33   Dg Chest Port 1 View  Result Date: 11/17/2018 CLINICAL DATA:  Intubation EXAM: PORTABLE CHEST 1 VIEW COMPARISON:  11/17/2018, 11/14/2018 FINDINGS: Endotracheal tube tip is about 3.1 cm superior to the carina. Esophageal tube tip extends below the diaphragm but is  non included. Bilateral pleural effusions, likely layering on the right. Cardiomegaly with vascular congestion and bilateral pulmonary edema. Basilar consolidations. No pneumothorax. IMPRESSION: 1. Endotracheal tube tip about 3.1 cm superior to carina 2. Cardiomegaly with vascular congestion, pulmonary edema and bilateral pleural effusion Electronically Signed   By: Donavan Foil M.D.   On: 11/17/2018 00:12   Dg Chest Port 1 View  Result Date: 11/14/2018 CLINICAL DATA:  Shortness of breath for 2 weeks. EXAM: PORTABLE CHEST 1 VIEW COMPARISON:  11/04/2010 radiograph FINDINGS: Cardiomegaly with pulmonary vascular congestion noted. Possible mild interstitial opacities/edema noted. No pneumothorax or acute bony abnormality. Bibasilar atelectasis noted. There may be trace  pleural effusions present. IMPRESSION: Cardiomegaly with pulmonary vascular congestion and possible mild interstitial edema. Bibasilar atelectasis.  Possible trace pleural effusions. Electronically Signed   By: Margarette Canada M.D.   On: 11/01/2018 17:13     Assessment and Recommendation  57 y.o. male with essential hypertension mixed hyperlipidemia diabetes COPD and sleep apnea with acute on chronic respiratory failure and diastolic dysfunction congestive heart failure somewhat improved with appropriate medication management including metoprolol for heart rate control diltiazem for heart rate control and treatment of respiratory failure.evidence of myocardial infarction 1.  Continue supportive care for respiratory failure COPD and sleep apnea with hypoxia 2.  Continue heart rate control with diltiazem drip and change over to oral metoprolol and diltiazem long-acting for heart rate control between 60 and 90 bpm 3.  Anticoagulation for further risk reduction in stroke with atrial fibrillation if patient is continuing to be responsive 4.  No further cardiac diagnostic necessary at this time 5.  Lasix for any worsening evidence of lower extremity edema pulmonary edema after above  Signed, Serafina Royals M.D. FACC

## 2018-11-19 NOTE — Progress Notes (Signed)
Pharmacy Electrolyte Monitoring Consult:  Pharmacy consulted to assist in monitoring and replacing electrolytes in this 58 y.o. male admitted on 10/29/2018. Patient admitted to ICU on 8/29 and intubated. Patient with past medical history significant for atrial fibrillation, CHF, diabetes, gout, hypertension, obesity, and sleep apnea.   Labs:  Sodium (mmol/L)  Date Value  11/19/2018 138   Potassium (mmol/L)  Date Value  11/19/2018 4.8   Magnesium (mg/dL)  Date Value  11/18/2018 2.5 (H)   Phosphorus (mg/dL)  Date Value  11/18/2018 3.8   Calcium (mg/dL)  Date Value  11/19/2018 8.7 (L)   Albumin (g/dL)  Date Value  11/17/2018 3.5    Assessment/Plan: Furosemide has been discontinued. No electrolyte replacement warranted.  Will replace for goal potassium ~ 4 and goal magnesium ~ 2.  Will obtain BMP with am labs and magnesium/phosphorus as warranted.   Pharmacy will continue to monitor and adjust per consult.   Tawnya Crook, PharmD 11/19/2018 11:49 AM

## 2018-11-19 NOTE — Progress Notes (Signed)
Per Lovena Le RN, they're doing patient care and to come back to placed another IV around 30-45 minutes.

## 2018-11-19 NOTE — Consult Note (Signed)
ANTICOAGULATION CONSULT NOTE - Initial Consult  Pharmacy Consult for Heparin Drip Indication: atrial fibrillation  No Known Allergies  Patient Measurements: Height: 5\' 9"  (175.3 cm) Weight: (!) 380 lb 11.8 oz (172.7 kg) IBW/kg (Calculated) : 70.7 Heparin Dosing Weight: 113.8 kg  Vital Signs: Temp: 97 F (36.1 C) (08/31 0804) Temp Source: Axillary (08/31 0804) BP: 130/98 (08/31 0930) Pulse Rate: 95 (08/31 0930)  Labs: Recent Labs    11/12/2018 1655 11/17/18 0029 11/17/18 0423  11/18/18 0501 11/18/18 2029 11/19/18 0514 11/19/18 0520  HGB 13.6 13.6  --   --  13.3  --  14.0  --   HCT 44.2 43.1  --   --  40.9  --  44.1  --   PLT 192 166  --   --  155  --  164  --   APTT  --   --   --   --   --  43*  --   --   LABPROT 30.9* 33.0*  --    < > 33.6* 35.2*  --  35.1*  INR 3.0* 3.3*  --    < > 3.4* 3.6*  --  3.6*  CREATININE 1.31* 1.28* 1.35*  --  1.36*  --  1.72*  --   TROPONINIHS 14 23*  --   --   --   --   --   --    < > = values in this interval not displayed.    Estimated Creatinine Clearance: 72.9 mL/min (A) (by C-G formula based on SCr of 1.72 mg/dL (H)).   Medical History: Past Medical History:  Diagnosis Date  . Atrial fibrillation ([place])   . CHF (congestive heart failure) ([place])   . Diabetes mellitus without complication ([place])   . Gout   . Hypertension associated with type 2 diabetes mellitus ([place])   . Morbid obesity ([place])   . Obstructive sleep apnea     Medications:  Medications Prior to Admission  Medication Sig Dispense Refill Last Dose  . aspirin EC 81 MG tablet Take 81 mg by mouth daily.   11/15/2018 at Unknown time  . colchicine 0.6 MG tablet Take 12 mg by mouth. At first sing of gout flare   prn at prn  . diltiazem (CARDIZEM CD) 240 MG 24 hr capsule Take 240 mg by mouth daily.   11/15/2018 at Unknown time  . furosemide (LASIX) 40 MG tablet Take 40 mg by mouth 2 (two) times daily.   11/15/2018 at Unknown time  . lisinopril (ZESTRIL) 5 MG tablet Take 5 mg  by mouth daily.   11/15/2018 at Unknown time  . metFORMIN (GLUCOPHAGE) 500 MG tablet Take 500 mg by mouth 2 (two) times daily with a meal.   11/15/2018 at Unknown time  . metoprolol tartrate (LOPRESSOR) 100 MG tablet Take 50 mg by mouth 2 (two) times daily.   11/15/2018 at Unknown time  . pravastatin (PRAVACHOL) 80 MG tablet Take 80 mg by mouth every evening.   11/15/2018 at Unknown time  . warfarin (COUMADIN) 4 MG tablet Take 2.5-4 mg by mouth daily. Take 4 mg on Sunday, Tuesday, Wednesday, Thursday, and Saturday. Take 2.5 mg on Monday and Friday   11/15/2018 at Unknown time   Scheduled:  . allopurinol  100 mg Oral Daily  . aspirin  81 mg Per NG tube Daily  . chlorhexidine  15 mL Mouth Rinse BID  . Chlorhexidine Gluconate Cloth  6 each Topical Daily  . diltiazem  240 mg  Oral Daily  . insulin aspart  0-15 Units Subcutaneous Q4H  . ipratropium-albuterol  3 mL Nebulization Q4H  . lisinopril  5 mg Per NG tube Daily  . mouth rinse  15 mL Mouth Rinse q12n4p  . methylPREDNISolone (SOLU-MEDROL) injection  40 mg Intravenous BID  . metoprolol tartrate  50 mg Per NG tube BID  . pantoprazole sodium  40 mg Per Tube QHS  . potassium chloride  40 mEq Per Tube Once  . pravastatin  80 mg Per NG tube QPM   Infusions:  . dexmedetomidine (PRECEDEX) IV infusion 1.2 mcg/kg/hr (11/19/18 1110)  . diltiazem (CARDIZEM) infusion 6 mg/hr (11/19/18 0932)   PRN: acetaminophen, bisacodyl, midazolam, ondansetron (ZOFRAN) IV, sennosides, sodium chloride flush, vecuronium Anti-infectives (From admission, onward)   None      Assessment: Pharmacy consulted to initiate Heparin Drip on 58yo patient admitted with acute exacerbation of CHF. Patient diagnosed with nonvalvular atrial fibrillation with acute onset of severe shortness of breath, weakness and fatigue, and hypoxia over the past week. Patient takes warfarin PTA with schedule being 4mg  SuTWThSa, and 2.5mg  MF which was restarted in-patient. INR level on 8/30 was 3.4.  [name] Hgb is stable. Will transition from Warfarin to Heparin while in-patient. Last warfarin dose was taken 8/29@2048   Goal of Therapy:  Heparin level 0.3-0.7 units/ml Monitor platelets by anticoagulation protocol: Yes   Plan:  INR 3.6, remains elevated. No indicated for heparin drip at this time. Will continue to hold and check INR with morning labs.  Tawnya Crook, PharmD 11/19/2018,11:33 AM

## 2018-11-20 LAB — BASIC METABOLIC PANEL
Anion gap: 11 (ref 5–15)
BUN: 52 mg/dL — ABNORMAL HIGH (ref 6–20)
CO2: 28 mmol/L (ref 22–32)
Calcium: 8.7 mg/dL — ABNORMAL LOW (ref 8.9–10.3)
Chloride: 102 mmol/L (ref 98–111)
Creatinine, Ser: 1.28 mg/dL — ABNORMAL HIGH (ref 0.61–1.24)
GFR calc Af Amer: >60 mL/min (ref >60)
GFR calc non Af Amer: >60 mL/min (ref >60)
Glucose, Bld: 197 mg/dL — ABNORMAL HIGH (ref 70–99)
Potassium: 3.9 mmol/L (ref 3.5–5.1)
Sodium: 141 mmol/L (ref 135–145)

## 2018-11-20 LAB — CBC WITH DIFFERENTIAL/PLATELET
Abs Immature Granulocytes: 0.03 10*3/uL (ref 0.00–0.07)
Basophils Absolute: 0 10*3/uL (ref 0.0–0.1)
Basophils Relative: 0 %
Eosinophils Absolute: 0 10*3/uL (ref 0.0–0.5)
Eosinophils Relative: 0 %
HCT: 41.9 % (ref 39.0–52.0)
Hemoglobin: 13.4 g/dL (ref 13.0–17.0)
Immature Granulocytes: 1 %
Lymphocytes Relative: 6 %
Lymphs Abs: 0.4 10*3/uL — ABNORMAL LOW (ref 0.7–4.0)
MCH: 29.8 pg (ref 26.0–34.0)
MCHC: 32 g/dL (ref 30.0–36.0)
MCV: 93.3 fL (ref 80.0–100.0)
Monocytes Absolute: 0.7 10*3/uL (ref 0.1–1.0)
Monocytes Relative: 12 %
Neutro Abs: 5.1 10*3/uL (ref 1.7–7.7)
Neutrophils Relative %: 81 %
Platelets: 159 10*3/uL (ref 150–400)
RBC: 4.49 MIL/uL (ref 4.22–5.81)
RDW: 12.9 % (ref 11.5–15.5)
WBC: 6.2 10*3/uL (ref 4.0–10.5)
nRBC: 0 % (ref 0.0–0.2)

## 2018-11-20 LAB — BLOOD GAS, ARTERIAL
Acid-Base Excess: 8.5 mmol/L — ABNORMAL HIGH (ref 0.0–2.0)
Bicarbonate: 31.8 mmol/L — ABNORMAL HIGH (ref 20.0–28.0)
FIO2: 0.4
MECHVT: 500 mL
Mechanical Rate: 25
O2 Saturation: 94.5 %
PEEP: 8 cmH2O
Patient temperature: 37
RATE: 25 resp/min
pCO2 arterial: 38 mmHg (ref 32.0–48.0)
pH, Arterial: 7.53 — ABNORMAL HIGH (ref 7.350–7.450)
pO2, Arterial: 68 mmHg — ABNORMAL LOW (ref 83.0–108.0)

## 2018-11-20 LAB — GLUCOSE, CAPILLARY
Glucose-Capillary: 165 mg/dL — ABNORMAL HIGH (ref 70–99)
Glucose-Capillary: 169 mg/dL — ABNORMAL HIGH (ref 70–99)
Glucose-Capillary: 180 mg/dL — ABNORMAL HIGH (ref 70–99)
Glucose-Capillary: 182 mg/dL — ABNORMAL HIGH (ref 70–99)
Glucose-Capillary: 209 mg/dL — ABNORMAL HIGH (ref 70–99)
Glucose-Capillary: 220 mg/dL — ABNORMAL HIGH (ref 70–99)

## 2018-11-20 LAB — TRIGLYCERIDES: Triglycerides: 222 mg/dL — ABNORMAL HIGH (ref <150)

## 2018-11-20 LAB — PROTIME-INR
INR: 3.8 — ABNORMAL HIGH (ref 0.8–1.2)
Prothrombin Time: 36.9 seconds — ABNORMAL HIGH (ref 11.4–15.2)

## 2018-11-20 LAB — BLOOD GAS, VENOUS
Acid-Base Excess: 5.6 mmol/L — ABNORMAL HIGH (ref 0.0–2.0)
Bicarbonate: 37.1 mmol/L — ABNORMAL HIGH (ref 20.0–28.0)
O2 Saturation: 76.6 %
Patient temperature: 37
pCO2, Ven: 95 mmHg (ref 44.0–60.0)
pH, Ven: 7.2 — ABNORMAL LOW (ref 7.250–7.430)
pO2, Ven: 51 mmHg — ABNORMAL HIGH (ref 32.0–45.0)

## 2018-11-20 LAB — HIV ANTIBODY (ROUTINE TESTING W REFLEX): HIV Screen 4th Generation wRfx: NONREACTIVE

## 2018-11-20 MED ORDER — METOPROLOL TARTRATE 5 MG/5ML IV SOLN
2.5000 mg | Freq: Four times a day (QID) | INTRAVENOUS | Status: DC | PRN
  Administered 2018-11-24 – 2018-12-02 (×6): 5 mg via INTRAVENOUS
  Administered 2018-12-02: 2.5 mg via INTRAVENOUS
  Administered 2018-12-09 – 2018-12-11 (×4): 5 mg via INTRAVENOUS
  Filled 2018-11-20 (×11): qty 5

## 2018-11-20 MED ORDER — DEXMEDETOMIDINE HCL IN NACL 400 MCG/100ML IV SOLN
0.4000 ug/kg/h | INTRAVENOUS | Status: DC
  Administered 2018-11-20: 23:00:00 0.8 ug/kg/h via INTRAVENOUS
  Administered 2018-11-20: 21:00:00 0.6 ug/kg/h via INTRAVENOUS
  Administered 2018-11-21: 0.4 ug/kg/h via INTRAVENOUS
  Administered 2018-11-21: 0.8 ug/kg/h via INTRAVENOUS
  Administered 2018-11-21: 0.2 ug/kg/h via INTRAVENOUS
  Administered 2018-11-21 – 2018-11-22 (×2): 0.8 ug/kg/h via INTRAVENOUS
  Administered 2018-11-22: 06:00:00 0.7 ug/kg/h via INTRAVENOUS
  Administered 2018-11-22 (×2): 0.8 ug/kg/h via INTRAVENOUS
  Administered 2018-11-23: 0.6 ug/kg/h via INTRAVENOUS
  Administered 2018-11-23 (×2): 0.4 ug/kg/h via INTRAVENOUS
  Administered 2018-11-23 (×2): 0.6 ug/kg/h via INTRAVENOUS
  Administered 2018-11-24: 07:00:00 0.4 ug/kg/h via INTRAVENOUS
  Administered 2018-11-24: 22:00:00 1 ug/kg/h via INTRAVENOUS
  Administered 2018-11-24: 01:00:00 0.399 ug/kg/h via INTRAVENOUS
  Administered 2018-11-24 – 2018-11-26 (×24): 1 ug/kg/h via INTRAVENOUS
  Administered 2018-11-27 (×2): 0.8 ug/kg/h via INTRAVENOUS
  Administered 2018-11-27: 09:00:00 1 ug/kg/h via INTRAVENOUS
  Administered 2018-11-27 (×2): 0.8 ug/kg/h via INTRAVENOUS
  Administered 2018-11-27 (×3): 1 ug/kg/h via INTRAVENOUS
  Administered 2018-11-27: 0.8 ug/kg/h via INTRAVENOUS
  Administered 2018-11-28 (×3): 0.7 ug/kg/h via INTRAVENOUS
  Administered 2018-11-28 (×2): 0.8 ug/kg/h via INTRAVENOUS
  Administered 2018-11-28: 0.7 ug/kg/h via INTRAVENOUS
  Administered 2018-11-28 (×2): 0.8 ug/kg/h via INTRAVENOUS
  Administered 2018-11-29 (×6): 0.7 ug/kg/h via INTRAVENOUS
  Administered 2018-11-30: 05:00:00 1 ug/kg/h via INTRAVENOUS
  Administered 2018-11-30: 03:00:00 0.7 ug/kg/h via INTRAVENOUS
  Administered 2018-11-30: 08:00:00 1 ug/kg/h via INTRAVENOUS
  Filled 2018-11-20 (×67): qty 100

## 2018-11-20 MED ORDER — SODIUM CHLORIDE 0.9 % IV SOLN
2.0000 mg/h | INTRAVENOUS | Status: DC
  Administered 2018-11-20: 11:00:00 1 mg/h via INTRAVENOUS
  Administered 2018-11-21 – 2018-11-26 (×5): 2 mg/h via INTRAVENOUS
  Filled 2018-11-20 (×7): qty 5

## 2018-11-20 MED ORDER — ALLOPURINOL 100 MG PO TABS
100.0000 mg | ORAL_TABLET | Freq: Every day | ORAL | Status: DC
  Administered 2018-11-21 – 2018-11-29 (×9): 100 mg
  Filled 2018-11-20 (×9): qty 1

## 2018-11-20 MED ORDER — MIDAZOLAM 50MG/50ML (1MG/ML) PREMIX INFUSION
0.5000 mg/h | INTRAVENOUS | Status: DC
  Administered 2018-11-20: 6.5 mg/h via INTRAVENOUS
  Administered 2018-11-20: 10 mg/h via INTRAVENOUS
  Administered 2018-11-20: 11:00:00 0.5 mg/h via INTRAVENOUS
  Administered 2018-11-21: 8 mg/h via INTRAVENOUS
  Administered 2018-11-21: 4 mg/h via INTRAVENOUS
  Administered 2018-11-21: 6 mg/h via INTRAVENOUS
  Administered 2018-11-22 (×2): 4 mg/h via INTRAVENOUS
  Administered 2018-11-23: 6 mg/h via INTRAVENOUS
  Administered 2018-11-23: 5 mg/h via INTRAVENOUS
  Administered 2018-11-24: 4 mg/h via INTRAVENOUS
  Administered 2018-11-24: 4.5 mg/h via INTRAVENOUS
  Administered 2018-11-24 – 2018-11-25 (×3): 5 mg/h via INTRAVENOUS
  Administered 2018-11-26: 2 mg/h via INTRAVENOUS
  Administered 2018-11-26 (×2): 5 mg/h via INTRAVENOUS
  Filled 2018-11-20 (×18): qty 50

## 2018-11-20 MED ORDER — PROPOFOL 1000 MG/100ML IV EMUL
INTRAVENOUS | Status: AC
  Administered 2018-11-20: 14:00:00
  Filled 2018-11-20: qty 100

## 2018-11-20 MED ORDER — ALPRAZOLAM 0.5 MG PO TABS
0.5000 mg | ORAL_TABLET | Freq: Two times a day (BID) | ORAL | Status: DC
  Administered 2018-11-21 – 2018-11-27 (×13): 0.5 mg
  Filled 2018-11-20 (×13): qty 1

## 2018-11-20 MED ORDER — QUETIAPINE FUMARATE 25 MG PO TABS
25.0000 mg | ORAL_TABLET | Freq: Every day | ORAL | Status: DC
  Administered 2018-11-21 – 2018-11-29 (×9): 25 mg
  Filled 2018-11-20 (×9): qty 1

## 2018-11-20 MED ORDER — ALPRAZOLAM 0.5 MG PO TABS
0.5000 mg | ORAL_TABLET | Freq: Two times a day (BID) | ORAL | Status: DC
  Administered 2018-11-20: 22:00:00 0.5 mg via ORAL
  Filled 2018-11-20: qty 1

## 2018-11-20 MED ORDER — QUETIAPINE FUMARATE 25 MG PO TABS
25.0000 mg | ORAL_TABLET | Freq: Every day | ORAL | Status: DC
  Administered 2018-11-20: 25 mg via ORAL
  Filled 2018-11-20: qty 1

## 2018-11-20 NOTE — Progress Notes (Signed)
[facility] Encounter Note  Patient: Gracyn Dzikowski / Admit Date: 10/29/2018 / Date of Encounter: 11/20/2018, 9:03 AM   Subjective: Patient unable to communicate well at this time due to intubation.  Patient had worsening hypoxia confusion and probable hypercapnia yesterday requiring intubation.  [name] hemodynamically stable at this time..  Atrial fibrillation with continued rapid rate although well controlled at this time with intravenous diltiazem and will take oral diltiazem when able after extubation and further treatment of severe lung disease.  No evidence of myocardial infarction at this time Echocardiogram with poor imaging but overall ejection fraction appears normal at 50% Review of Systems: Cannot assess due to intubation and sedation  Objective: Telemetry: Atrial fibrillation with rapid ventricular rate Physical Exam: Blood pressure 93/64, pulse (!) 31, temperature 98.6 F (37 C), temperature source Axillary, resp. rate (!) 24, height 5' 9.02" (1.753 m), weight (!) 170.3 kg, SpO2 95 %. Body mass index is 55.42 kg/m. General: Well developed, well nourished, intubated Head: Normocephalic, atraumatic, sclera non-icteric, no xanthomas, nares are without discharge. Neck: No apparent masses Lungs: Normal respirations with few use wheezes, some rhonchi, no rales , no crackles   Heart: Irregular rate and rhythm, normal S1 S2, no murmur, no rub, no gallop, PMI is normal size and placement, carotid upstroke normal without bruit, jugular venous pressure normal Abdomen: Soft, non-tender,  distended with normoactive bowel sounds. No hepatosplenomegaly. Abdominal aorta is normal size without bruit Extremities: 1+ edema, no clubbing, no cyanosis, no ulcers,  Peripheral: 2+ radial, 2+ femoral, 2+ dorsal pedal pulses Neuro: Not alert and oriented.  Does not moves all extremities spontaneously. Psych: Does not responds to questions appropriately with a normal  affect.   Intake/Output Summary (Last 24 hours) at 11/20/2018 0903 Last data filed at 11/20/2018 0804 Gross per 24 hour  Intake 1789.01 ml  Output 950 ml  Net 839.01 ml    Inpatient Medications:  . allopurinol  100 mg Oral Daily  . aspirin  81 mg Per NG tube Daily  . chlorhexidine gluconate (MEDLINE KIT)  15 mL Mouth Rinse BID  . Chlorhexidine Gluconate Cloth  6 each Topical Daily  . diltiazem  240 mg Oral Daily  . insulin aspart  0-15 Units Subcutaneous Q4H  . ipratropium-albuterol  3 mL Nebulization Q4H  . lisinopril  5 mg Per NG tube Daily  . mouth rinse  15 mL Mouth Rinse 10 times per day  . methylPREDNISolone (SOLU-MEDROL) injection  40 mg Intravenous BID  . metoprolol tartrate  50 mg Per NG tube BID  . pantoprazole sodium  40 mg Per Tube QHS  . potassium chloride  40 mEq Per Tube Once  . pravastatin  80 mg Per NG tube QPM   Infusions:  . diltiazem (CARDIZEM) infusion 10 mg/hr (11/20/18 0804)  . fentaNYL infusion INTRAVENOUS 400 mcg/hr (11/20/18 0804)  . propofol (DIPRIVAN) infusion 40 mcg/kg/min (11/20/18 0804)    Labs: Recent Labs    11/18/18 0501 11/19/18 0514 11/20/18 0416  NA 138 138 141  K 3.9 4.8 3.9  CL 96* 97* 102  CO2 29 29 28  GLUCOSE 210* 198* 197*  BUN 41* 56* 52*  CREATININE 1.36* 1.72* 1.28*  CALCIUM 8.7* 8.7* 8.7*  MG 2.5*  --   --   PHOS 3.8  --   --    No results for input(s): AST, ALT, ALKPHOS, BILITOT, PROT, ALBUMIN in the last 72 hours. Recent Labs    11/19/18 0514 11/20/18 0416  WBC 8.9   6.2  NEUTROABS  --  5.1  HGB 14.0 13.4  HCT 44.1 41.9  MCV 94.8 93.3  PLT 164 159   No results for input(s): CKTOTAL, CKMB, TROPONINI in the last 72 hours. Invalid input(s): POCBNP No results for input(s): HGBA1C in the last 72 hours.   Weights: Filed Weights   11/17/18 0420 11/18/18 0500 11/20/18 0500  Weight: (!) 173.2 kg (!) 172.7 kg (!) 170.3 kg     Radiology/Studies:  Ct Abdomen Pelvis Wo Contrast  Result Date: 11/17/2018 CLINICAL  DATA:  Abdominal rigidity. Concern for perforated viscus. Currently intubated. EXAM: CT ABDOMEN AND PELVIS WITHOUT CONTRAST TECHNIQUE: Multidetector CT imaging of the abdomen and pelvis was performed following the standard protocol without IV contrast. COMPARISON:  None. FINDINGS: Lower chest: Small right and trace left pleural effusions with bilateral lower lobe airspace disease. Small amount of herniated right middle lobe between the fifth and sixth ribs. Hepatobiliary: Severe hepatic steatosis. Several small gallstones. No gallbladder wall thickening or biliary dilatation. Pancreas: Unremarkable. No pancreatic ductal dilatation or surrounding inflammatory changes. Spleen: Normal in size without focal abnormality. Adrenals/Urinary Tract: Adrenal glands are unremarkable. Punctate calculus in the lower pole the left kidney. No hydronephrosis. Bladder is decompressed by Foley catheter. Stomach/Bowel: Enteric tube tip in the distal stomach. Stomach is within normal limits. Appendix appears normal. No evidence of bowel wall thickening, distention, or inflammatory changes. Vascular/Lymphatic: Aortic atherosclerosis. No enlarged abdominal or pelvic lymph nodes. Reproductive: Prostate is unremarkable. Other: Trace ascites.  No pneumoperitoneum. Musculoskeletal: No acute or significant osseous findings. IMPRESSION: 1.  No acute intra-abdominal process.  No perforation. 2. Trace ascites. 3. Severe hepatic steatosis. 4. Cholelithiasis. 5. Punctate nonobstructive left nephrolithiasis. 6. Small right and trace left pleural effusions. Bilateral lower lobe airspace disease may represent atelectasis, but pneumonia is difficult to exclude, particularly on the right. Electronically Signed   By: Titus Dubin M.D.   On: 11/17/2018 13:50   Dg Abd 1 View  Result Date: 11/17/2018 CLINICAL DATA:  Abdomen distension tube placement EXAM: ABDOMEN - 1 VIEW COMPARISON:  10/24/2018 FINDINGS: Esophageal tube tip visible to the distal  stomach but incompletely included. Airspace disease at the right base. IMPRESSION: Esophageal tubing visible to the gastric body but tip is incompletely included in the field of view. Electronically Signed   By: Donavan Foil M.D.   On: 11/17/2018 00:10   Dg Abd 1 View  Result Date: 11/18/2018 CLINICAL DATA:  Abdominal distention EXAM: ABDOMEN - 1 VIEW COMPARISON:  None. FINDINGS: Limited study due to portable nature of the study and body habitus. I see no significant bowel dilatation or free air. Study otherwise limited. IMPRESSION: Very limited study. No visible changes of bowel obstruction or free air. Electronically Signed   By: Rolm Baptise M.D.   On: 11/18/2018 22:33   Dg Chest Port 1 View  Result Date: 11/19/2018 CLINICAL DATA:  Respiratory failure. EXAM: PORTABLE CHEST 1 VIEW COMPARISON:  11/10/2018 prior radiographs FINDINGS: An endotracheal tube with tip 4 cm above the carina and NG tube entering the stomach with tip off the field of view again noted. Cardiomegaly, pulmonary vascular congestion, small bilateral pleural effusions and bilateral LOWER lung atelectasis/consolidation again noted. There is no evidence of pneumothorax. IMPRESSION: Unchanged appearance of the chest with support apparatus as described. Cardiomegaly, pulmonary vascular congestion, small bilateral pleural effusions and bilateral LOWER lung atelectasis/consolidation. Electronically Signed   By: Margarette Canada M.D.   On: 11/19/2018 16:51   Dg Chest [facility] 1 View  Result  Date: 11/17/2018 CLINICAL DATA:  Intubation EXAM: PORTABLE CHEST 1 VIEW COMPARISON:  11/17/2018, 11/10/2018 FINDINGS: Endotracheal tube tip is about 3.1 cm superior to the carina. Esophageal tube tip extends below the diaphragm but is non included. Bilateral pleural effusions, likely layering on the right. Cardiomegaly with vascular congestion and bilateral pulmonary edema. Basilar consolidations. No pneumothorax. IMPRESSION: 1. Endotracheal tube tip about 3.1 cm  superior to carina 2. Cardiomegaly with vascular congestion, pulmonary edema and bilateral pleural effusion Electronically Signed   By: Donavan Foil M.D.   On: 11/17/2018 00:12   Dg Chest Port 1 View  Result Date: 10/25/2018 CLINICAL DATA:  Shortness of breath for 2 weeks. EXAM: PORTABLE CHEST 1 VIEW COMPARISON:  11/04/2010 radiograph FINDINGS: Cardiomegaly with pulmonary vascular congestion noted. Possible mild interstitial opacities/edema noted. No pneumothorax or acute bony abnormality. Bibasilar atelectasis noted. There may be trace pleural effusions present. IMPRESSION: Cardiomegaly with pulmonary vascular congestion and possible mild interstitial edema. Bibasilar atelectasis.  Possible trace pleural effusions. Electronically Signed   By: Margarette Canada M.D.   On: 10/31/2018 17:13   Dg Abd Portable 1v  Result Date: 11/19/2018 CLINICAL DATA:  NG tube placement. EXAM: PORTABLE ABDOMEN - 1 VIEW COMPARISON:  None. FINDINGS: An NG tube is noted with tip overlying the distal stomach. IMPRESSION: NG tube with tip overlying the distal stomach. Electronically Signed   By: Margarette Canada M.D.   On: 11/19/2018 16:51     Assessment and Recommendation  58 y.o. male with essential hypertension mixed hyperlipidemia diabetes COPD and sleep apnea with acute on chronic respiratory failure and diastolic dysfunction congestive heart failure somewhat improved with appropriate medication management including metoprolol for heart rate control diltiazem for heart rate control and treatment of respiratory failure.evidence of myocardial infarction 1.  Continue supportive care for respiratory failure COPD and sleep apnea with hypoxia which appears to be driving the majority of his diastolic dysfunction as well as atrial fibrillation currently controlled with medication management 2.  Continue heart rate control with diltiazem drip  for heart rate control between 60 and 90 bpm and would consider oral medication management after  extubation and improvements of pulmonary status 3.  Anticoagulation for further risk reduction in stroke with atrial fibrillation if patient is continuing to be responsive 4.  No further cardiac diagnostic necessary at this time due to no evidence of myocardial infarction or LV systolic dysfunction by echocardiogram 5.  Lasix for any worsening evidence of lower extremity edema pulmonary edema as necessary throughout treatment of above 6.  Call if further questions  Signed, Serafina Royals M.D. FACC

## 2018-11-20 NOTE — Progress Notes (Signed)
PT Cancellation Note  Patient Details Name: Kyle White MRN: [id number] DOB: Jul 21, 1960   Cancelled Treatment:    Reason Eval/Treat Not Completed: Patient not medically ready.  Chart reviewed.  Pt re-intubated 8/31 (yesterday); d/t this pt not appropriate for participation in therapy--will discontinue current PT order.  Please re-consult PT when pt is medically appropriate for therapy participation.  Leitha Bleak, PT 11/20/18, 8:13 AM [phone]

## 2018-11-20 NOTE — Progress Notes (Signed)
Pharmacy Electrolyte Monitoring Consult:  Pharmacy consulted to assist in monitoring and replacing electrolytes in this 58 y.o. male admitted on 10/20/2018. Patient admitted to ICU on 8/29 and intubated. Patient with past medical history significant for atrial fibrillation, CHF, diabetes, gout, hypertension, obesity, and sleep apnea.   Labs:  Sodium (mmol/L)  Date Value  11/20/2018 141   Potassium (mmol/L)  Date Value  11/20/2018 3.9   Magnesium (mg/dL)  Date Value  11/18/2018 2.5 (H)   Phosphorus (mg/dL)  Date Value  11/18/2018 3.8   Calcium (mg/dL)  Date Value  11/20/2018 8.7 (L)   Albumin (g/dL)  Date Value  11/17/2018 3.5    Assessment/Plan: No electrolyte replacement warranted.  Will replace for goal potassium ~ 4 and goal magnesium ~ 2.  Will obtain BMP with am labs and magnesium/phosphorus as warranted.   Pharmacy will continue to monitor and adjust per consult.   Tawnya Crook, PharmD 11/20/2018 11:48 AM

## 2018-11-20 NOTE — Progress Notes (Addendum)
Palliative: Kyle White is lying quietly in bed, he is intubated/ventilated and sedated.  He does not respond to voice or touch at this time.  Bedside nursing staff present, no needs at this time.  There is no family at bedside at this time.  Call to sister, Kyle White, at [phone].  We talk about ventilator support.  Kyle White tells me that Kyle White is "not on life support".  "The only reason he was intubated yesterday was for his oxygen".  She shares that due to medication he was given, he was "talking out of his head" and unable to keep his nasal cannula in place.  We talked about the 2 ways that ventilator works providing oxygenation and ventilation.  I share that ventilator support is indeed life support, but I am unable to go further before Kyle White interrupts me stating that her brother is not on life support.    Kyle White then states, "what do you need from me?".  I share that palliative medicine team is available to be an extra layer of support, answer questions.  Kyle White states that she has no questions, declines to talk further.  I share that PMT will follow daily.    Plan: Continue full scope/full code.  Continue CODE STATUS discussions  60 minutes Kyle Axe, NP Palliative Medicine Team Team Phone # [phone] Greater than 50% of this time was spent counseling and coordinating care related to the above assessment and plan.

## 2018-11-20 NOTE — Progress Notes (Addendum)
Inpatient Diabetes Program Recommendations  AACE/ADA: New Consensus Statement on Inpatient Glycemic Control (2015)  Target Ranges:  Prepandial:   less than 140 mg/dL      Peak postprandial:   less than 180 mg/dL (1-2 hours)      Critically ill patients:  140 - 180 mg/dL   Lab Results  Component Value Date   GLUCAP 180 (H) 11/20/2018   HGBA1C 7.3 (H) 11/17/2018    Review of Glycemic Control Results for Kyle White, Kyle White (MRN [id number]) as of 11/20/2018 10:31  Ref. Range 11/19/2018 07:49 11/19/2018 11:56 11/19/2018 16:14 11/19/2018 19:38 11/19/2018 23:55 11/20/2018 04:00 11/20/2018 07:42  Glucose-Capillary Latest Ref Range: 70 - 99 mg/dL 189 (H)  Novolog 3 units  Solumedrol 60 mg 195 (H)  Novolog 3 units 205 (H)  Novolog 5 units 211 (H)  Novolog 5 units  Solumedrol 40 mg 180 (H)  Novolog 3 untis 209 (H)  Novolog 5 units 180 (H)  Novolog 3 untis   Diabetes history: DM 2 Outpatient Diabetes medications: Metformin 1000 mg bid Current orders for Inpatient glycemic control:  Novolog Moderate 0-15 units Q4 hours  Inpatient Diabetes Program Recommendations:    Solumedrol decreased to 40 mg bid 8/31 Glucose trends elevated. Renal function slightly elevated. Not on Tube Feeds.  If patient continues steroids at current dose, consider Lantus 12 units (less than 0-1 units/kg).  Will need decrease if extubated.  Thanks,  Tama Headings RN, MSN, BC-ADM Inpatient Diabetes Coordinator Team Pager [phone] (8a-5p)

## 2018-11-20 NOTE — Progress Notes (Signed)
Dr. Mortimer Fries notified of minimal urine output today. Dr. Mortimer Fries just wants to continue to monitor output. No new orders received.

## 2018-11-20 NOTE — Consult Note (Signed)
ANTICOAGULATION CONSULT NOTE - Initial Consult  Pharmacy Consult for Heparin Drip Indication: atrial fibrillation  No Known Allergies  Patient Measurements: Height: 5' 9.02" (175.3 cm) Weight: (!) 375 lb 7.1 oz (170.3 kg) IBW/kg (Calculated) : 70.74 Heparin Dosing Weight: 113.8 kg  Vital Signs: Temp: 98.6 F (37 C) (09/01 0800) Temp Source: Axillary (09/01 0800) BP: 93/67 (09/01 1100) Pulse Rate: 75 (09/01 1100)  Labs: Recent Labs    11/18/18 0501 11/18/18 2029 11/19/18 0514 11/19/18 0520 11/20/18 0416  HGB 13.3  --  14.0  --  13.4  HCT 40.9  --  44.1  --  41.9  PLT 155  --  164  --  159  APTT  --  43*  --   --   --   LABPROT 33.6* 35.2*  --  35.1* 36.9*  INR 3.4* 3.6*  --  3.6* 3.8*  CREATININE 1.36*  --  1.72*  --  1.28*    Estimated Creatinine Clearance: 97.1 mL/min (A) (by C-G formula based on SCr of 1.28 mg/dL (H)).   Medical History: Past Medical History:  Diagnosis Date  . Atrial fibrillation ([place])   . CHF (congestive heart failure) ([place])   . Diabetes mellitus without complication ([place])   . Gout   . Hypertension associated with type 2 diabetes mellitus ([place])   . Morbid obesity ([place])   . Obstructive sleep apnea     Medications:  Medications Prior to Admission  Medication Sig Dispense Refill Last Dose  . aspirin EC 81 MG tablet Take 81 mg by mouth daily.   11/15/2018 at Unknown time  . colchicine 0.6 MG tablet Take 12 mg by mouth. At first sing of gout flare   prn at prn  . diltiazem (CARDIZEM CD) 240 MG 24 hr capsule Take 240 mg by mouth daily.   11/15/2018 at Unknown time  . furosemide (LASIX) 40 MG tablet Take 40 mg by mouth 2 (two) times daily.   11/15/2018 at Unknown time  . lisinopril (ZESTRIL) 5 MG tablet Take 5 mg by mouth daily.   11/15/2018 at Unknown time  . metFORMIN (GLUCOPHAGE) 500 MG tablet Take 500 mg by mouth 2 (two) times daily with a meal.   11/15/2018 at Unknown time  . metoprolol tartrate (LOPRESSOR) 100 MG tablet Take 50 mg by mouth 2  (two) times daily.   11/15/2018 at Unknown time  . pravastatin (PRAVACHOL) 80 MG tablet Take 80 mg by mouth every evening.   11/15/2018 at Unknown time  . warfarin (COUMADIN) 4 MG tablet Take 2.5-4 mg by mouth daily. Take 4 mg on Sunday, Tuesday, Wednesday, Thursday, and Saturday. Take 2.5 mg on Monday and Friday   11/15/2018 at Unknown time   Scheduled:  . allopurinol  100 mg Oral Daily  . aspirin  81 mg Per NG tube Daily  . chlorhexidine gluconate (MEDLINE KIT)  15 mL Mouth Rinse BID  . Chlorhexidine Gluconate Cloth  6 each Topical Daily  . diltiazem  240 mg Oral Daily  . insulin aspart  0-15 Units Subcutaneous Q4H  . ipratropium-albuterol  3 mL Nebulization Q4H  . lisinopril  5 mg Per NG tube Daily  . mouth rinse  15 mL Mouth Rinse 10 times per day  . methylPREDNISolone (SOLU-MEDROL) injection  40 mg Intravenous BID  . metoprolol tartrate  50 mg Per NG tube BID  . pantoprazole sodium  40 mg Per Tube QHS  . potassium chloride  40 mEq Per Tube Once  . pravastatin  80 mg Per NG tube QPM   Infusions:  . diltiazem (CARDIZEM) infusion 7.5 mg/hr (11/20/18 1123)  . HYDROmorphone 1 mg/hr (11/20/18 1123)  . midazolam 0.5 mg/hr (11/20/18 1123)   PRN: acetaminophen, bisacodyl, midazolam, ondansetron (ZOFRAN) IV, sennosides, sodium chloride flush, vecuronium Anti-infectives (From admission, onward)   None      Assessment: Pharmacy consulted to initiate Heparin Drip on 58yo patient admitted with acute exacerbation of CHF. Patient diagnosed with nonvalvular atrial fibrillation with acute onset of severe shortness of breath, weakness and fatigue, and hypoxia over the past week. Patient takes warfarin PTA with schedule being 42m SuTWThSa, and 2.56mMF which was restarted in-patient. INR level on 8/30 was 3.4. [name] Hgb is stable. Will transition from Warfarin to Heparin while in-patient. Last warfarin dose was taken 8/29_0   Date INR Dose 8/_1 mg 8/29 3.3  8/30 3.4 8/31 3.6 9/1 3.8  Goal of Therapy:  Heparin level 0.3-0.7 units/ml Monitor platelets by anticoagulation protocol: Yes   Plan:  INR 3.8, continues to rise after 4 mg dose on 8/28. Doubt that rise in INR is completely in response to one dose of warfarin. Question if patient has underlying liver dysfunction. Patient has no signs of bleeding. No indication for Vit K at this time. Will continue to monitor INR daily.  AbTawnya CrookPharmD 11/20/2018,11:27 AM

## 2018-11-20 NOTE — Progress Notes (Signed)
OT Cancellation Note  Patient Details Name: Kyle White MRN: [id number] DOB: 07-Nov-1960   Cancelled Treatment:    Reason Eval/Treat Not Completed: Patient not medically ready. Chart reviewed. Pt was re-intubated yesterday. Not appropriate for participation in therapy at this time. Will complete order. Please re-consult OT if/when pt is medically appropriate.  Jeni Salles, MPH, MS, OTR/L ascom [phone] 11/20/18, 7:43 AM

## 2018-11-20 NOTE — Progress Notes (Signed)
CRITICAL CARE NOTE 58 yo w/hx of morbid obesity, HFpEF hx MVR, CHF, PAF, OSA, DM, AHRF came in with acute hypoxemic hypercarbic resp failure with CXR showing b/l pl effusions and pulm edema. CT with restrictive physiology, pulm edema, bilateral pleural effusions and compressive atelectasis s/p extubation on bIPAP Re-intubated 8/31  CC  follow up respiratory failure  SUBJECTIVE Patient remains critically ill Prognosis is guarded   BP 92/67   Pulse 75   Temp 98.8 F (37.1 C) (Oral)   Resp (!) 24   Ht 5' 9.02" (1.753 m)   Wt (!) 170.3 kg   SpO2 94%   BMI 55.42 kg/m    I/O last 3 completed shifts: In: 2471.8 [I.V.:2471.8] Out: 2260 [Urine:2260] No intake/output data recorded.  SpO2: 94 % O2 Flow Rate (L/min): 3 L/min FiO2 (%): 75 %   SIGNIFICANT EVENTS 8/30 extubated 8/31 re-intubated, PICC line placed    Vent Mode: PRVC FiO2 (%):  [50 %-80 %] 75 % Set Rate:  [24 bmp] 24 bmp Vt Set:  [500 mL] 500 mL PEEP:  [10 cmH20] 10 cmH20  CBC    Component Value Date/Time   WBC 6.2 11/20/2018 0416   RBC 4.49 11/20/2018 0416   HGB 13.4 11/20/2018 0416   HCT 41.9 11/20/2018 0416   PLT 159 11/20/2018 0416   MCV 93.3 11/20/2018 0416   MCH 29.8 11/20/2018 0416   MCHC 32.0 11/20/2018 0416   RDW 12.9 11/20/2018 0416   LYMPHSABS 0.4 (L) 11/20/2018 0416   MONOABS 0.7 11/20/2018 0416   EOSABS 0.0 11/20/2018 0416   BASOSABS 0.0 11/20/2018 0416   BMP Latest Ref Rng & Units 11/20/2018 11/19/2018 11/18/2018  Glucose 70 - 99 mg/dL 197(H) 198(H) 210(H)  BUN 6 - 20 mg/dL 52(H) 56(H) 41(H)  Creatinine 0.61 - 1.24 mg/dL 1.28(H) 1.72(H) 1.36(H)  Sodium 135 - 145 mmol/L 141 138 138  Potassium 3.5 - 5.1 mmol/L 3.9 4.8 3.9  Chloride 98 - 111 mmol/L 102 97(L) 96(L)  CO2 22 - 32 mmol/L 28 29 29   Calcium 8.9 - 10.3 mg/dL 8.7(L) 8.7(L) 8.7(L)    REVIEW OF SYSTEMS  [name] UNABLE TO PROVIDE COMPLETE REVIEW OF SYSTEMS DUE TO SEVERE CRITICAL ILLNESS   PHYSICAL  EXAMINATION:  GENERAL:critically ill appearing, +resp distress HEAD: Normocephalic, atraumatic.  EYES: Pupils equal, round, reactive to light.  No scleral icterus.  MOUTH: Moist mucosal membrane. NECK: Supple.  PULMONARY: +rhonchi, +wheezing CARDIOVASCULAR: S1 and S2. Regular rate and rhythm. No murmurs, rubs, or gallops.  GASTROINTESTINAL: Soft, nontender, -distended. No masses. Positive bowel sounds. No hepatosplenomegaly.  MUSCULOSKELETAL: No swelling, clubbing, or edema.  NEUROLOGIC: obtunded, GCS<8 SKIN:intact,warm,dry  MEDICATIONS: I have reviewed all medications and confirmed regimen as documented   CULTURE RESULTS   Recent Results (from the past 240 hour(s))  SARS CORONAVIRUS 2 (TAT 6-12 HRS) Nasal Swab Aptima Multi Swab     Status: None   Collection Time: 11/17/2018  4:55 PM   Specimen: Aptima Multi Swab; Nasal Swab  Result Value Ref Range Status   SARS Coronavirus 2 NEGATIVE NEGATIVE Final    Comment: (NOTE) SARS-CoV-2 target nucleic acids are NOT DETECTED. The SARS-CoV-2 RNA is generally detectable in upper and lower respiratory specimens during the acute phase of infection. Negative results do not preclude SARS-CoV-2 infection, do not rule out co-infections with other pathogens, and should not be used as the sole basis for treatment or other patient management decisions. Negative results must be combined with clinical observations, patient history, and epidemiological information.  The expected result is Negative. Fact Sheet for Patients: SugarRoll.be Fact Sheet for Healthcare Providers: https://www.woods-mathews.com/ This test is not yet approved or cleared by the [place] FDA and  has been authorized for detection and/or diagnosis of SARS-CoV-2 by FDA under an Emergency Use Authorization (EUA). This EUA will remain  in effect (meaning this test can be used) for the duration of the COVID-19 declaration under Section  56 4(b)(1) of the Act, 21 U.S.C. section 360bbb-3(b)(1), unless the authorization is terminated or revoked sooner. Performed at [facility], [place] [address] [place], [place] [address]   SARS Coronavirus 2 [facility] order, Performed in [facility] hospital lab) Nasopharyngeal Nasopharyngeal Swab     Status: None   Collection Time: 10/25/2018 10:34 PM   Specimen: Nasopharyngeal Swab  Result Value Ref Range Status   SARS Coronavirus 2 NEGATIVE NEGATIVE Final    Comment: (NOTE) If result is NEGATIVE SARS-CoV-2 target nucleic acids are NOT DETECTED. The SARS-CoV-2 RNA is generally detectable in upper and lower  respiratory specimens during the acute phase of infection. The lowest  concentration of SARS-CoV-2 viral copies this assay can detect is 250  copies / mL. A negative result does not preclude SARS-CoV-2 infection  and should not be used as the sole basis for treatment or other  patient management decisions.  A negative result may occur with  improper specimen collection / handling, submission of specimen other  than nasopharyngeal swab, presence of viral mutation(s) within the  areas targeted by this assay, and inadequate number of viral copies  (<250 copies / mL). A negative result must be combined with clinical  observations, patient history, and epidemiological information. If result is POSITIVE SARS-CoV-2 target nucleic acids are DETECTED. The SARS-CoV-2 RNA is generally detectable in upper and lower  respiratory specimens dur ing the acute phase of infection.  Positive  results are indicative of active infection with SARS-CoV-2.  Clinical  correlation with patient history and other diagnostic information is  necessary to determine patient infection status.  Positive results do  not rule out bacterial infection or co-infection with other viruses. If result is PRESUMPTIVE POSTIVE SARS-CoV-2 nucleic acids MAY BE PRESENT.   A presumptive positive result was obtained on the  submitted specimen  and confirmed on repeat testing.  While 2019 novel coronavirus  (SARS-CoV-2) nucleic acids may be present in the submitted sample  additional confirmatory testing may be necessary for epidemiological  and / or clinical management purposes  to differentiate between  SARS-CoV-2 and other Sarbecovirus currently known to infect humans.  If clinically indicated additional testing with an alternate test  methodology [phone]) is advised. The SARS-CoV-2 RNA is generally  detectable in upper and lower respiratory sp ecimens during the acute  phase of infection. The expected result is Negative. Fact Sheet for Patients:  StrictlyIdeas.no Fact Sheet for Healthcare Providers: BankingDealers.co.za This test is not yet approved or cleared by the [place] FDA and has been authorized for detection and/or diagnosis of SARS-CoV-2 by FDA under an Emergency Use Authorization (EUA).  This EUA will remain in effect (meaning this test can be used) for the duration of the COVID-19 declaration under Section 564(b)(1) of the Act, 21 U.S.C. section 360bbb-3(b)(1), unless the authorization is terminated or revoked sooner. Performed at [facility], [address] [place], [place] [address]   Culture, respiratory (non-expectorated)     Status: None   Collection Time: 10/25/2018 10:34 PM   Specimen: Tracheal Aspirate; Respiratory  Result Value Ref  Range Status   Specimen Description   Final    TRACHEAL ASPIRATE Performed at [facility], [address] [place], [place] [address]    Special Requests   Final    NONE Performed at [facility], [address] [place], [place] [address]    Gram Stain   Final    RARE WBC PRESENT, PREDOMINANTLY PMN ABUNDANT GRAM POSITIVE COCCI IN CHAINS    Culture   Final    Consistent with normal respiratory flora. Performed at [facility], [place] [address]  [place], [place] [address]    Report Status 11/19/2018 FINAL  Final  Urine Culture     Status: None   Collection Time: 10/21/2018 11:12 PM   Specimen: Urine, Random  Result Value Ref Range Status   Specimen Description   Final    URINE, RANDOM Performed at [facility], [address], [place], [place] [address]    Special Requests   Final    NONE Performed at [facility], [address] [place], [place] [address]    Culture   Final    NO GROWTH Performed at [facility], [place] [address], [place], [place] [address]    Report Status 11/18/2018 FINAL  Final  MRSA PCR Screening     Status: None   Collection Time: 10/31/2018 11:28 PM   Specimen: Nasopharyngeal  Result Value Ref Range Status   MRSA by PCR NEGATIVE NEGATIVE Final    Comment:        The GeneXpert MRSA Assay (FDA approved for NASAL specimens only), is one component of a comprehensive MRSA colonization surveillance program. It is not intended to diagnose MRSA infection nor to guide or monitor treatment for MRSA infections. Performed at [facility], [address] [place], [place] [address]   Culture, blood (Routine X 2) w Reflex to ID Panel     Status: None (Preliminary result)   Collection Time: 11/17/18 12:19 AM   Specimen: BLOOD  Result Value Ref Range Status   Specimen Description BLOOD RIGHT ANTECUBITAL  Final   Special Requests   Final    BOTTLES DRAWN AEROBIC AND ANAEROBIC Blood Culture adequate volume   Culture   Final    NO GROWTH 3 DAYS Performed at [facility], [address], [place], [place] [address]    Report Status PENDING  Incomplete  Culture, blood (Routine X 2) w Reflex to ID Panel     Status: None (Preliminary result)   Collection Time: 11/17/18 12:31 AM   Specimen: BLOOD  Result Value Ref Range Status   Specimen Description BLOOD CENTRAL LINE MIDLINE  Final   Special Requests   Final    BOTTLES DRAWN AEROBIC AND ANAEROBIC Blood Culture adequate  volume   Culture   Final    NO GROWTH 3 DAYS Performed at [facility], [address], [place],  [address]    Report Status PENDING  Incomplete          IMAGING    Dg Chest Port 1 View  Result Date: 11/19/2018 CLINICAL DATA:  Respiratory failure. EXAM: PORTABLE CHEST 1 VIEW COMPARISON:  10/30/2018 prior radiographs FINDINGS: An endotracheal tube with tip 4 cm above the carina and NG tube entering the stomach with tip off the field of view again noted. Cardiomegaly, pulmonary vascular congestion, small bilateral pleural effusions and bilateral LOWER lung atelectasis/consolidation again noted. There is no evidence of pneumothorax. IMPRESSION: Unchanged appearance of the  chest with support apparatus as described. Cardiomegaly, pulmonary vascular congestion, small bilateral pleural effusions and bilateral LOWER lung atelectasis/consolidation. Electronically Signed   By: Margarette Canada M.D.   On: 11/19/2018 16:51   Dg Abd Portable 1v  Result Date: 11/19/2018 CLINICAL DATA:  NG tube placement. EXAM: PORTABLE ABDOMEN - 1 VIEW COMPARISON:  None. FINDINGS: An NG tube is noted with tip overlying the distal stomach. IMPRESSION: NG tube with tip overlying the distal stomach. Electronically Signed   By: Margarette Canada M.D.   On: 11/19/2018 16:51       Indwelling Urinary Catheter continued, requirement due to   Reason to continue Indwelling Urinary Catheter strict Intake/Output monitoring for hemodynamic instability   Central Line/ continued, requirement due to  Reason to continue [place] of central venous pressure or other hemodynamic parameters and poor IV access   Ventilator continued, requirement due to severe respiratory failure   Ventilator Sedation RASS 0 to -2      ASSESSMENT AND PLAN SYNOPSIS   Severe ACUTE Hypoxic and Hypercapnic Respiratory Failure pulm edema HFpEF with effusions and renal failure with OSA/OHS and morbid obesity -continue Full MV  support -continue Bronchodilator Therapy -Wean Fio2 and PEEP as tolerated Can not wean due to severe hypoxia  ACUTE SYSTOLIC CARDIAC FAILURE- HFpEF -Lasix as tolerated   ACUTE KIDNEY INJURY/Renal Failure -follow chem 7 -follow UO -continue Foley Catheter-assess need -Avoid nephrotoxic agents -Recheck creatinine    NEUROLOGY - intubated and sedated - minimal sedation to achieve a RASS goal: -1   CARDIAC ICU monitoring   GI GI PROPHYLAXIS as indicated  NUTRITIONAL STATUS DIET-->TF's as tolerated Constipation protocol as indicated  ENDO - will use ICU hypoglycemic\Hyperglycemia protocol if indicated   ELECTROLYTES -follow labs as needed -replace as needed -pharmacy consultation and following   DVT/GI PRX ordered TRANSFUSIONS AS NEEDED MONITOR FSBS ASSESS the need for LABS as needed   Critical Care Time devoted to patient care services described in this note is 34  minutes.   Overall, patient is critically ill, prognosis is guarded.  Patient with Multiorgan failure and at high risk for cardiac arrest and death.   Family updated, I recommend DNR status Palliative care following   Sherrina Zaugg Patricia Pesa, M.D.  [facility]  Medical Director [place] Director [facility] Cardio-Pulmonary Department

## 2018-11-20 NOTE — Progress Notes (Signed)
[place] [facility] [place] NAME: Kyle White    MR#:  [id number]  DATE OF BIRTH:  03-06-1961  SUBJECTIVE:  CHIEF COMPLAINT:   Chief Complaint  Patient presents with  . Shortness of Breath  . Weakness  Remains sedated on ventilator REVIEW OF SYSTEMS:  ROS: unable to obtain as on BiPAP DRUG ALLERGIES:  No Known Allergies VITALS:  Blood pressure 134/79, pulse (!) 51, temperature 98.8 F (37.1 C), temperature source Axillary, resp. rate (!) 24, height 5' 9.02" (1.753 m), weight (!) 170.3 kg, SpO2 94 %. PHYSICAL EXAMINATION:  Physical Exam Constitutional:      General: He is sleeping.  HENT:     Head: Normocephalic and atraumatic.     Comments: On vent Eyes:     Conjunctiva/sclera: Conjunctivae normal.     Pupils: Pupils are equal, round, and reactive to light.  Neck:     Musculoskeletal: Normal range of motion and neck supple.     Thyroid: No thyromegaly.     Trachea: No tracheal deviation.  Cardiovascular:     Rate and Rhythm: Normal rate and regular rhythm.     Heart sounds: Normal heart sounds.  Pulmonary:     Effort: Pulmonary effort is normal. No respiratory distress.     Breath sounds: Normal breath sounds. No wheezing.  Chest:     Chest wall: No tenderness.  Abdominal:     General: Bowel sounds are decreased. There is no distension.     Palpations: Abdomen is rigid.     Tenderness: There is no abdominal tenderness.  Musculoskeletal: Normal range of motion.  Skin:    General: Skin is warm and dry.     Findings: No rash.  Neurological:     Cranial Nerves: No cranial nerve deficit.     Comments: On vent  Psychiatric:     Comments: Difficult to evaluate as he is on BiPAP    LABORATORY PANEL:  Male CBC Recent Labs  Lab 11/20/18 0416  WBC 6.2  HGB 13.4  HCT 41.9  PLT 159   ------------------------------------------------------------------------------------------------------------------ Chemistries  Recent  Labs  Lab 11/17/18 0029  11/18/18 0501  11/20/18 0416  NA 136   < > 138   < > 141  K 4.8   < > 3.9   < > 3.9  CL 94*   < > 96*   < > 102  CO2 31   < > 29   < > 28  GLUCOSE 172*   < > 210*   < > 197*  BUN 34*   < > 41*   < > 52*  CREATININE 1.28*   < > 1.36*   < > 1.28*  CALCIUM 8.7*   < > 8.7*   < > 8.7*  MG 2.6*  --  2.5*  --   --   AST 20  --   --   --   --   ALT 25  --   --   --   --   ALKPHOS 64  --   --   --   --   BILITOT 1.0  --   --   --   --    < > = values in this interval not displayed.   RADIOLOGY:  No results found. ASSESSMENT AND PLAN:   1.  Acute on chronic hypoxic respiratory failure -  Extubated on 8/31 but required reintubation, vent management per PCCM team  2.  Obstructive sleep apnea  3.  Acute on chronic diastolic CHF, EF 55 to [id number] on echo in 2014 at [facility] - Repeat echocardiogram shows EF of 55 to 60%  4.  History of atrial fibrillation - Continue Coumadin - INR 3.8  5.  Acute renal failure with creatinine 1.28 - Repeat BMP in the a.m. and continue to monitor renal function closely  6.  Type II Diabetes mellitus - Sliding scale insulin -Hemoglobin A1c 7.3  7.  Generalized weakness -Physical therapy eval once extubated  8.  Abdominal distention -No acute pathology on CT scan or KUB performed yesterday -Could be ileus, NG tube in place  PPI prophylaxis   Overall poor prognosis  All the records are reviewed and case discussed with Care Management/Social Worker. Management plans discussed with the patient, nursing and they are in agreement.  CODE STATUS: Full Code  TOTAL TIME TAKING CARE OF THIS PATIENT: 15 minutes.   More than 50% of the time was spent in counseling/coordination of care: YES  POSSIBLE D/C IN 3-4 DAYS, DEPENDING ON CLINICAL CONDITION.   Max Sane M.D on 11/20/2018 at 5:02 PM  Between 7am to 6pm - Pager - [phone]  After 6pm go to [email or web address] - Technical brewer [facility]  Office  [phone]  CC: Primary care physician; [employer]  Note: This dictation was prepared with Diplomatic Services operational officer dictation along with smaller Company secretary. Any transcriptional errors that result from this process are unintentional.

## 2018-11-20 DEATH — deceased

## 2018-11-21 ENCOUNTER — Inpatient Hospital Stay: Payer: Medicare Other

## 2018-11-21 LAB — BASIC METABOLIC PANEL
Anion gap: 11 (ref 5–15)
BUN: 60 mg/dL — ABNORMAL HIGH (ref 6–20)
CO2: 28 mmol/L (ref 22–32)
Calcium: 8.7 mg/dL — ABNORMAL LOW (ref 8.9–10.3)
Chloride: 99 mmol/L (ref 98–111)
Creatinine, Ser: 1.58 mg/dL — ABNORMAL HIGH (ref 0.61–1.24)
GFR calc Af Amer: 55 mL/min — ABNORMAL LOW (ref >60)
GFR calc non Af Amer: 47 mL/min — ABNORMAL LOW (ref >60)
Glucose, Bld: 210 mg/dL — ABNORMAL HIGH (ref 70–99)
Potassium: 4.1 mmol/L (ref 3.5–5.1)
Sodium: 138 mmol/L (ref 135–145)

## 2018-11-21 LAB — PROTIME-INR
INR: 3.6 — ABNORMAL HIGH (ref 0.8–1.2)
Prothrombin Time: 35.5 seconds — ABNORMAL HIGH (ref 11.4–15.2)

## 2018-11-21 LAB — CBC
HCT: 43.1 % (ref 39.0–52.0)
Hemoglobin: 14.1 g/dL (ref 13.0–17.0)
MCH: 29.8 pg (ref 26.0–34.0)
MCHC: 32.7 g/dL (ref 30.0–36.0)
MCV: 91.1 fL (ref 80.0–100.0)
Platelets: 164 10*3/uL (ref 150–400)
RBC: 4.73 MIL/uL (ref 4.22–5.81)
RDW: 12.9 % (ref 11.5–15.5)
WBC: 6.5 10*3/uL (ref 4.0–10.5)
nRBC: 0 % (ref 0.0–0.2)

## 2018-11-21 LAB — GLUCOSE, CAPILLARY
Glucose-Capillary: 191 mg/dL — ABNORMAL HIGH (ref 70–99)
Glucose-Capillary: 191 mg/dL — ABNORMAL HIGH (ref 70–99)
Glucose-Capillary: 207 mg/dL — ABNORMAL HIGH (ref 70–99)
Glucose-Capillary: 226 mg/dL — ABNORMAL HIGH (ref 70–99)
Glucose-Capillary: 230 mg/dL — ABNORMAL HIGH (ref 70–99)
Glucose-Capillary: 275 mg/dL — ABNORMAL HIGH (ref 70–99)

## 2018-11-21 MED ORDER — PRO-STAT SUGAR FREE PO LIQD
60.0000 mL | Freq: Three times a day (TID) | ORAL | Status: DC
  Administered 2018-11-21 – 2018-12-12 (×59): 60 mL

## 2018-11-21 MED ORDER — VITAL HIGH PROTEIN PO LIQD
1000.0000 mL | ORAL | Status: DC
  Administered 2018-11-21 – 2018-11-22 (×4): 1000 mL

## 2018-11-21 MED ORDER — ADULT MULTIVITAMIN LIQUID CH
15.0000 mL | Freq: Every day | ORAL | Status: DC
  Administered 2018-11-21 – 2018-12-12 (×20): 15 mL
  Filled 2018-11-21 (×22): qty 15

## 2018-11-21 NOTE — Plan of Care (Signed)
Pt positioned on Sizewize BigTurn bari bed this shift.  VSS on 40% FIO2 Vent settings.  Sedation requirements reduced, he remains comfortable.  Feedings started at 45 ml/hr

## 2018-11-21 NOTE — Progress Notes (Signed)
CRITICAL CARE NOTE 58 yo w/hx of morbid obesity, HFpEF hx MVR, CHF, PAF, OSA, DM, AHRF came in with acute hypoxemic hypercarbic resp failure with CXR showing b/l pl effusions and pulm edema. CT with restrictive physiology, pulm edema, bilateral pleural effusions and compressive atelectasiss/p extubation on bIPAP Re-intubated 8/31 CC  follow up respiratory failure  SUBJECTIVE Patient remains critically ill Prognosis is guarded   BP (!) 128/95   Pulse (!) 54   Temp 97.9 F (36.6 C) (Oral)   Resp (!) 24   Ht 5' 9.02" (1.753 m)   Wt (!) 168.8 kg   SpO2 96%   BMI 54.93 kg/m    I/O last 3 completed shifts: In: 2430.3 [I.V.:2430.3] Out: 2155 [Urine:1605; Emesis/NG output:550] No intake/output data recorded.  SpO2: 96 % O2 Flow Rate (L/min): 3 L/min FiO2 (%): 75 %  Vent Mode: PRVC FiO2 (%):  [75 %] 75 % Set Rate:  [24 bmp] 24 bmp Vt Set:  [500 mL] 500 mL PEEP:  [10 cmH20] 10 cmH20 Plateau Pressure:  [23 cmH20] 23 cmH20  SIGNIFICANT EVENTS 8/29 admitted for severe resp failure CHF exacerbation 8/30 extubated 8/31 re-intubated, PICC line placed   REVIEW OF SYSTEMS  [name] UNABLE TO PROVIDE COMPLETE REVIEW OF SYSTEMS DUE TO SEVERE CRITICAL ILLNESS   PHYSICAL EXAMINATION:  GENERAL:critically ill appearing, +resp distress HEAD: Normocephalic, atraumatic.  EYES: Pupils equal, round, reactive to light.  No scleral icterus.  MOUTH: Moist mucosal membrane. NECK: Supple.  PULMONARY: +rhonchi, +wheezing CARDIOVASCULAR: S1 and S2. Regular rate and rhythm. No murmurs, rubs, or gallops.  GASTROINTESTINAL: Soft, nontender, -distended. No masses. Positive bowel sounds. No hepatosplenomegaly.  MUSCULOSKELETAL: No swelling, clubbing, or edema.  NEUROLOGIC: obtunded, GCS<8 SKIN:intact,warm,dry  MEDICATIONS: I have reviewed all medications and confirmed regimen as documented  CBC    Component Value Date/Time   WBC 6.5 11/21/2018 0346   RBC 4.73 11/21/2018 0346   HGB  14.1 11/21/2018 0346   HCT 43.1 11/21/2018 0346   PLT 164 11/21/2018 0346   MCV 91.1 11/21/2018 0346   MCH 29.8 11/21/2018 0346   MCHC 32.7 11/21/2018 0346   RDW 12.9 11/21/2018 0346   LYMPHSABS 0.4 (L) 11/20/2018 0416   MONOABS 0.7 11/20/2018 0416   EOSABS 0.0 11/20/2018 0416   BASOSABS 0.0 11/20/2018 0416   BMP Latest Ref Rng & Units 11/21/2018 11/20/2018 11/19/2018  Glucose 70 - 99 mg/dL 210(H) 197(H) 198(H)  BUN 6 - 20 mg/dL 60(H) 52(H) 56(H)  Creatinine 0.61 - 1.24 mg/dL 1.58(H) 1.28(H) 1.72(H)  Sodium 135 - 145 mmol/L 138 141 138  Potassium 3.5 - 5.1 mmol/L 4.1 3.9 4.8  Chloride 98 - 111 mmol/L 99 102 97(L)  CO2 22 - 32 mmol/L 28 28 29   Calcium 8.9 - 10.3 mg/dL 8.7(L) 8.7(L) 8.7(L)    CULTURE RESULTS   Recent Results (from the past 240 hour(s))  SARS CORONAVIRUS 2 (TAT 6-12 HRS) Nasal Swab Aptima Multi Swab     Status: None   Collection Time: 10/21/2018  4:55 PM   Specimen: Aptima Multi Swab; Nasal Swab  Result Value Ref Range Status   SARS Coronavirus 2 NEGATIVE NEGATIVE Final    Comment: (NOTE) SARS-CoV-2 target nucleic acids are NOT DETECTED. The SARS-CoV-2 RNA is generally detectable in upper and lower respiratory specimens during the acute phase of infection. Negative results do not preclude SARS-CoV-2 infection, do not rule out co-infections with other pathogens, and should not be used as the sole basis for treatment or other patient management decisions.  Negative results must be combined with clinical observations, patient history, and epidemiological information. The expected result is Negative. Fact Sheet for Patients: SugarRoll.be Fact Sheet for Healthcare Providers: https://www.woods-mathews.com/ This test is not yet approved or cleared by the [place] FDA and  has been authorized for detection and/or diagnosis of SARS-CoV-2 by FDA under an Emergency Use Authorization (EUA). This EUA will remain  in effect (meaning  this test can be used) for the duration of the COVID-19 declaration under Section 56 4(b)(1) of the Act, 21 U.S.C. section 360bbb-3(b)(1), unless the authorization is terminated or revoked sooner. Performed at [facility], [place] [address] [place], [place] [address]   SARS Coronavirus 2 [facility] order, Performed in [facility] hospital lab) Nasopharyngeal Nasopharyngeal Swab     Status: None   Collection Time: 11/12/2018 10:34 PM   Specimen: Nasopharyngeal Swab  Result Value Ref Range Status   SARS Coronavirus 2 NEGATIVE NEGATIVE Final    Comment: (NOTE) If result is NEGATIVE SARS-CoV-2 target nucleic acids are NOT DETECTED. The SARS-CoV-2 RNA is generally detectable in upper and lower  respiratory specimens during the acute phase of infection. The lowest  concentration of SARS-CoV-2 viral copies this assay can detect is 250  copies / mL. A negative result does not preclude SARS-CoV-2 infection  and should not be used as the sole basis for treatment or other  patient management decisions.  A negative result may occur with  improper specimen collection / handling, submission of specimen other  than nasopharyngeal swab, presence of viral mutation(s) within the  areas targeted by this assay, and inadequate number of viral copies  (<250 copies / mL). A negative result must be combined with clinical  observations, patient history, and epidemiological information. If result is POSITIVE SARS-CoV-2 target nucleic acids are DETECTED. The SARS-CoV-2 RNA is generally detectable in upper and lower  respiratory specimens dur ing the acute phase of infection.  Positive  results are indicative of active infection with SARS-CoV-2.  Clinical  correlation with patient history and other diagnostic information is  necessary to determine patient infection status.  Positive results do  not rule out bacterial infection or co-infection with other viruses. If result is PRESUMPTIVE POSTIVE SARS-CoV-2  nucleic acids MAY BE PRESENT.   A presumptive positive result was obtained on the submitted specimen  and confirmed on repeat testing.  While 2019 novel coronavirus  (SARS-CoV-2) nucleic acids may be present in the submitted sample  additional confirmatory testing may be necessary for epidemiological  and / or clinical management purposes  to differentiate between  SARS-CoV-2 and other Sarbecovirus currently known to infect humans.  If clinically indicated additional testing with an alternate test  methodology [phone]) is advised. The SARS-CoV-2 RNA is generally  detectable in upper and lower respiratory sp ecimens during the acute  phase of infection. The expected result is Negative. Fact Sheet for Patients:  StrictlyIdeas.no Fact Sheet for Healthcare Providers: BankingDealers.co.za This test is not yet approved or cleared by the [place] FDA and has been authorized for detection and/or diagnosis of SARS-CoV-2 by FDA under an Emergency Use Authorization (EUA).  This EUA will remain in effect (meaning this test can be used) for the duration of the COVID-19 declaration under Section 564(b)(1) of the Act, 21 U.S.C. section 360bbb-3(b)(1), unless the authorization is terminated or revoked sooner. Performed at [facility], [address] [place], [place] [address]   Culture, respiratory (non-expectorated)     Status: None   Collection Time:  11/07/2018 10:34 PM   Specimen: Tracheal Aspirate; Respiratory  Result Value Ref Range Status   Specimen Description   Final    TRACHEAL ASPIRATE Performed at [facility], [address] [place], [place] [address]    Special Requests   Final    NONE Performed at [facility], [address] [place], [place] [address]    Gram Stain   Final    RARE WBC PRESENT, PREDOMINANTLY PMN ABUNDANT GRAM POSITIVE COCCI IN CHAINS    Culture   Final    Consistent with  normal respiratory flora. Performed at [facility], [place] [address], [place], [place] [address]    Report Status 11/19/2018 FINAL  Final  Urine Culture     Status: None   Collection Time: 10/31/2018 11:12 PM   Specimen: Urine, Random  Result Value Ref Range Status   Specimen Description   Final    URINE, RANDOM Performed at [facility], [address], [place], [place] [address]    Special Requests   Final    NONE Performed at [facility], [address] [place], [place] [address]    Culture   Final    NO GROWTH Performed at [facility], [place] [address] [place], [place] [address]    Report Status 11/18/2018 FINAL  Final  MRSA PCR Screening     Status: None   Collection Time: 11/04/2018 11:28 PM   Specimen: Nasopharyngeal  Result Value Ref Range Status   MRSA by PCR NEGATIVE NEGATIVE Final    Comment:        The GeneXpert MRSA Assay (FDA approved for NASAL specimens only), is one component of a comprehensive MRSA colonization surveillance program. It is not intended to diagnose MRSA infection nor to guide or monitor treatment for MRSA infections. Performed at [facility], [address] [place], [place] [address]   Culture, blood (Routine X 2) w Reflex to ID Panel     Status: None (Preliminary result)   Collection Time: 11/17/18 12:19 AM   Specimen: BLOOD  Result Value Ref Range Status   Specimen Description BLOOD RIGHT ANTECUBITAL  Final   Special Requests   Final    BOTTLES DRAWN AEROBIC AND ANAEROBIC Blood Culture adequate volume   Culture   Final    NO GROWTH 3 DAYS Performed at [facility], [address] [place], [place] [address]    Report Status PENDING  Incomplete  Culture, blood (Routine X 2) w Reflex to ID Panel     Status: None (Preliminary result)   Collection Time: 11/17/18 12:31 AM   Specimen: BLOOD  Result Value Ref Range Status   Specimen Description BLOOD CENTRAL LINE MIDLINE  Final    Special Requests   Final    BOTTLES DRAWN AEROBIC AND ANAEROBIC Blood Culture adequate volume   Culture   Final    NO GROWTH 3 DAYS Performed at [facility], [address], [place],  [address]    Report Status PENDING  Incomplete          Indwelling Urinary Catheter continued, requirement due to   Reason to continue Indwelling Urinary Catheter strict Intake/Output monitoring for hemodynamic instability   Central Line/ continued, requirement due to  Reason to continue [place] of central venous pressure or other hemodynamic parameters and poor IV access   Ventilator continued, requirement due to severe respiratory failure   Ventilator Sedation RASS 0 to -2  ASSESSMENT AND PLAN SYNOPSIS   Severe ACUTE Hypoxic and Hypercapnic Respiratory Failure from pulm edema with effusions and progressiove renal failure with OHS and OSA with morbid obesity -continue Full MV support -continue Bronchodilator Therapy -Wean Fio2 and PEEP as tolerated Unable to wean vent today due to high fio2 and PEEP  ACUTE SYSTOLIC CARDIAC FAILURE- HFpEF -oxygen as needed -Lasix as tolerated -follow up cardiac enzymes as indicated   ACUTE KIDNEY INJURY/Renal Failure -follow chem 7 -follow UO -continue Foley Catheter-assess need -Avoid nephrotoxic agents -Recheck creatinine    NEUROLOGY - intubated and sedated - minimal sedation to achieve a RASS goal: -1  CARDIAC ICU monitoring   GI GI PROPHYLAXIS as indicated  NUTRITIONAL STATUS DIET-->TF's as tolerated Constipation protocol as indicated  ENDO - will use ICU hypoglycemic\Hyperglycemia protocol if indicated   ELECTROLYTES -follow labs as needed -replace as needed -pharmacy consultation and following   DVT/GI PRX ordered TRANSFUSIONS AS NEEDED MONITOR FSBS ASSESS the need for LABS as needed   Critical Care Time devoted to patient care services described in this note is 32 minutes.    Overall, patient is critically ill, prognosis is guarded.  Patient with Multiorgan failure and at high risk for cardiac arrest and death.   Will update family Recommend DNR status  Guliana Weyandt Patricia Pesa, M.D.  [facility]  Medical Director [place] Director [facility] Cardio-Pulmonary Department

## 2018-11-21 NOTE — Progress Notes (Signed)
[place] [facility] [place] NAME: Kyle White    MR#:  [id number]  DATE OF BIRTH:  18-May-1960  SUBJECTIVE:  patient remains intubated on the ventilator  REVIEW OF SYSTEMS:   Review of Systems  Unable to perform ROS: Intubated   DRUG ALLERGIES:  No Known Allergies  VITALS:  Blood pressure (!) 124/111, pulse (!) 42, temperature 97.7 F (36.5 C), temperature source Oral, resp. rate (!) 24, height 5' 9.02" (1.753 m), weight (!) 168.8 kg, SpO2 97 %.  PHYSICAL EXAMINATION:   Physical Exam  GENERAL:  58 y.o.-year-old patient lying in the bed with no acute distress. Critically ill intubated on the vent EYES: Pupils equal, round, reactive to light and accommodation. No scleral icterus. Extraocular muscles intact.  HEENT: Head atraumatic, normocephalic. Oropharynx and nasopharynx clear.  NECK:  Supple, no jugular venous distention. No thyroid enlargement, no tenderness.  LUNGS: Normal breath sounds bilaterally, no wheezing, rales, rhonchi. No use of accessory muscles of respiration.  CARDIOVASCULAR: S1, S2 normal. No murmurs, rubs, or gallops.  ABDOMEN: Soft, nontender, nondistended. Bowel sounds present. No organomegaly or mass.  EXTREMITIES: No cyanosis, clubbing or edema b/l.    NEUROLOGIC: sedated  PSYCHIATRIC: sedated  And on the vent SKIN: No obvious rash, lesion, or ulcer.   LABORATORY PANEL:  CBC Recent Labs  Lab 11/21/18 0346  WBC 6.5  HGB 14.1  HCT 43.1  PLT 164    Chemistries  Recent Labs  Lab 11/17/18 0029  11/18/18 0501  11/21/18 0346  NA 136   < > 138   < > 138  K 4.8   < > 3.9   < > 4.1  CL 94*   < > 96*   < > 99  CO2 31   < > 29   < > 28  GLUCOSE 172*   < > 210*   < > 210*  BUN 34*   < > 41*   < > 60*  CREATININE 1.28*   < > 1.36*   < > 1.58*  CALCIUM 8.7*   < > 8.7*   < > 8.7*  MG 2.6*  --  2.5*  --   --   AST 20  --   --   --   --   ALT 25  --   --   --   --   ALKPHOS 64  --   --   --   --    BILITOT 1.0  --   --   --   --    < > = values in this interval not displayed.   Cardiac Enzymes No results for input(s): TROPONINI in the last 168 hours. RADIOLOGY:  Dg Chest Port 1 View  Result Date: 11/21/2018 CLINICAL DATA:  Acute respiratory failure. EXAM: PORTABLE CHEST 1 VIEW COMPARISON:  Radiograph of November 19, 2018. FINDINGS: Stable cardiomegaly. Endotracheal and nasogastric tubes are unchanged in position. No pneumothorax is noted. Probable mild to moderate size left pleural effusion is noted with associated atelectasis or infiltrate. Minimal right basilar atelectasis may be present. Bony thorax unremarkable. IMPRESSION: Stable support apparatus. Increased left basilar density is noted concerning for mild to moderate pleural effusion with probable underlying atelectasis or infiltrate. Electronically Signed   By: Marijo Conception M.D.   On: 11/21/2018 08:39   Dg Chest Port 1 View  Result Date: 11/19/2018 CLINICAL DATA:  Respiratory failure. EXAM: PORTABLE CHEST 1 VIEW COMPARISON:  11/09/2018 prior  radiographs FINDINGS: An endotracheal tube with tip 4 cm above the carina and NG tube entering the stomach with tip off the field of view again noted. Cardiomegaly, pulmonary vascular congestion, small bilateral pleural effusions and bilateral LOWER lung atelectasis/consolidation again noted. There is no evidence of pneumothorax. IMPRESSION: Unchanged appearance of the chest with support apparatus as described. Cardiomegaly, pulmonary vascular congestion, small bilateral pleural effusions and bilateral LOWER lung atelectasis/consolidation. Electronically Signed   By: Margarette Canada M.D.   On: 11/19/2018 16:51   Dg Abd Portable 1v  Result Date: 11/19/2018 CLINICAL DATA:  NG tube placement. EXAM: PORTABLE ABDOMEN - 1 VIEW COMPARISON:  None. FINDINGS: An NG tube is noted with tip overlying the distal stomach. IMPRESSION: NG tube with tip overlying the distal stomach. Electronically Signed   By: Margarette Canada M.D.   On: 11/19/2018 16:51   ASSESSMENT AND PLAN:  Kyle White  is a 58 y.o. male with a known history of atrial fibrillation on Coumadin, CHF, diabetes mellitus, and obstructive sleep apnea with CPAP use at home, morbid obesity with sedentary lifestyle on oxygen at 2 L/min at home.  1.Acute on chronic hypoxic respiratory failure - Extubated on 8/31 but required reintubation, vent management per PCCM team  2.Obstructive sleep apnea-- chronic  3. Acute on chronic diastolic CHF, EF 55 to 123456 on echo in 2014 at [facility] -Repeat echocardiogram shows EF of 55 to 60%  4. History of atrial fibrillation -Continue Coumadin - INR 3.8  5. Acute renal failure with creatinine 1.28 -Repeat BMP in the a.m. and continue to monitor renal function closely  6.  Type IIDiabetes mellitus -Sliding scale insulin -Hemoglobin A1c 7.3  7. Generalized weakness -Physical therapy eval once extubated  8.  Abdominal distention -No acute pathology on CT scan or KUB performed yesterday -Could be ileus, NG tube in place  CODE STATUS: FULL  DVT Prophylaxis:   TOTAL TIME TAKING CARE OF THIS PATIENT: 25  minutes.  >50% time spent on counselling and coordination of care  POSSIBLE D/C IN *?* DAYS, DEPENDING ON CLINICAL CONDITION.  Note: This dictation was prepared with Dragon dictation along with smaller phrase technology. Any transcriptional errors that result from this process are unintentional.  Fritzi Mandes M.D on 11/21/2018 at 2:43 PM  Between 7am to 6pm - Pager - [phone]  After 6pm go to [email or web address] - password [email or web address] [place] [facility]  Office  [phone]  CC: Primary care physician; Inc, [facility] ServicesPatient ID: Kyle White, male   DOB: 15-Jan-1961, 58 y.o.   MRN: [id number]

## 2018-11-21 NOTE — Consult Note (Signed)
ANTICOAGULATION CONSULT NOTE - Initial Consult  Pharmacy Consult for Heparin Drip Indication: atrial fibrillation  No Known Allergies  Patient Measurements: Height: 5' 9.02" (175.3 cm) Weight: (!) 372 lb 2.2 oz (168.8 kg) IBW/kg (Calculated) : 70.74 Heparin Dosing Weight: 113.8 kg  Vital Signs: Temp: 97.7 F (36.5 C) (09/02 1200) Temp Source: Oral (09/02 1200) BP: 124/111 (09/02 1300) Pulse Rate: 42 (09/02 1300)  Labs: Recent Labs    11/18/18 2029  11/19/18 0514 11/19/18 0520 11/20/18 0416 11/21/18 0346  HGB  --    < > 14.0  --  13.4 14.1  HCT  --   --  44.1  --  41.9 43.1  PLT  --   --  164  --  159 164  APTT 43*  --   --   --   --   --   LABPROT 35.2*  --   --  35.1* 36.9* 35.5*  INR 3.6*  --   --  3.6* 3.8* 3.6*  CREATININE  --   --  1.72*  --  1.28* 1.58*   < > = values in this interval not displayed.    Estimated Creatinine Clearance: 78.3 mL/min (A) (by C-G formula based on SCr of 1.58 mg/dL (H)).   Medical History: Past Medical History:  Diagnosis Date  . Atrial fibrillation ([place])   . CHF (congestive heart failure) ([place])   . Diabetes mellitus without complication ([place])   . Gout   . Hypertension associated with type 2 diabetes mellitus ([place])   . Morbid obesity ([place])   . Obstructive sleep apnea     Medications:  Medications Prior to Admission  Medication Sig Dispense Refill Last Dose  . aspirin EC 81 MG tablet Take 81 mg by mouth daily.   11/15/2018 at Unknown time  . colchicine 0.6 MG tablet Take 12 mg by mouth. At first sing of gout flare   prn at prn  . diltiazem (CARDIZEM CD) 240 MG 24 hr capsule Take 240 mg by mouth daily.   11/15/2018 at Unknown time  . furosemide (LASIX) 40 MG tablet Take 40 mg by mouth 2 (two) times daily.   11/15/2018 at Unknown time  . lisinopril (ZESTRIL) 5 MG tablet Take 5 mg by mouth daily.   11/15/2018 at Unknown time  . metFORMIN (GLUCOPHAGE) 500 MG tablet Take 500 mg by mouth 2 (two) times daily with a meal.   11/15/2018 at  Unknown time  . metoprolol tartrate (LOPRESSOR) 100 MG tablet Take 50 mg by mouth 2 (two) times daily.   11/15/2018 at Unknown time  . pravastatin (PRAVACHOL) 80 MG tablet Take 80 mg by mouth every evening.   11/15/2018 at Unknown time  . warfarin (COUMADIN) 4 MG tablet Take 2.5-4 mg by mouth daily. Take 4 mg on Sunday, Tuesday, Wednesday, Thursday, and Saturday. Take 2.5 mg on Monday and Friday   11/15/2018 at Unknown time   Scheduled:  . allopurinol  100 mg Per Tube Daily  . ALPRAZolam  0.5 mg Per Tube BID  . aspirin  81 mg Per NG tube Daily  . chlorhexidine gluconate (MEDLINE KIT)  15 mL Mouth Rinse BID  . Chlorhexidine Gluconate Cloth  6 each Topical Daily  . diltiazem  240 mg Oral Daily  . feeding supplement (PRO-STAT SUGAR FREE 64)  60 mL Per Tube TID  . feeding supplement (VITAL HIGH PROTEIN)  1,000 mL Per Tube Q24H  . insulin aspart  0-15 Units Subcutaneous Q4H  . ipratropium-albuterol  3 mL Nebulization Q4H  . lisinopril  5 mg Per NG tube Daily  . mouth rinse  15 mL Mouth Rinse 10 times per day  . methylPREDNISolone (SOLU-MEDROL) injection  40 mg Intravenous BID  . metoprolol tartrate  50 mg Per NG tube BID  . multivitamin  15 mL Per Tube Daily  . pantoprazole sodium  40 mg Per Tube QHS  . potassium chloride  40 mEq Per Tube Once  . pravastatin  80 mg Per NG tube QPM  . QUEtiapine  25 mg Per Tube QHS   Infusions:  . dexmedetomidine (PRECEDEX) IV infusion 0.8 mcg/kg/hr (11/21/18 1230)  . diltiazem (CARDIZEM) infusion Stopped (11/21/18 0201)  . HYDROmorphone 2 mg/hr (11/21/18 1200)  . midazolam 6 mg/hr (11/21/18 1200)   PRN: acetaminophen, bisacodyl, metoprolol tartrate, midazolam, ondansetron (ZOFRAN) IV, sennosides, sodium chloride flush, vecuronium Anti-infectives (From admission, onward)   None      Assessment: Pharmacy consulted to initiate Heparin Drip on 58yo patient admitted with acute exacerbation of CHF. Patient diagnosed with nonvalvular atrial fibrillation with  acute onset of severe shortness of breath, weakness and fatigue, and hypoxia over the past week. Patient takes warfarin PTA with schedule being 37m SuTWThSa, and 2.518mMF which was restarted in-patient. INR level on 8/30 was 3.4. [name] Hgb is stable. Will transition from Warfarin to Heparin while in-patient. Last warfarin dose was taken 8/29@2048   Date INR Dose 8/28 3 4  mg 8/29 3.3  8/30 3.4 8/31 3.6 9/1 3.8 9/2 3.6  Goal of Therapy:  Heparin level 0.3-0.7 units/ml Monitor platelets by anticoagulation protocol: Yes   Plan:  INR 3.6, slightly decreased from yesterday. May be trending down. Increase in INR does not appear to correlate with given dose. Question if patient may have underlying liver dysfunction. If patient to continue on warfarin, may likely need much lower dose. Will continue to hold warfarin and monitor INR daily.  AbTawnya CrookPharmD 11/21/2018,1:19 PM

## 2018-11-21 NOTE — Progress Notes (Signed)
Pharmacy Electrolyte Monitoring Consult:  Pharmacy consulted to assist in monitoring and replacing electrolytes in this 58 y.o. male admitted on 11/05/2018. Patient admitted to ICU on 8/29 and intubated. Patient with past medical history significant for atrial fibrillation, CHF, diabetes, gout, hypertension, obesity, and sleep apnea.   Labs:  Sodium (mmol/L)  Date Value  11/21/2018 138   Potassium (mmol/L)  Date Value  11/21/2018 4.1   Magnesium (mg/dL)  Date Value  11/18/2018 2.5 (H)   Phosphorus (mg/dL)  Date Value  11/18/2018 3.8   Calcium (mg/dL)  Date Value  11/21/2018 8.7 (L)   Albumin (g/dL)  Date Value  11/17/2018 3.5    Assessment/Plan: No electrolyte replacement warranted.  Will replace for goal potassium ~ 4 and goal magnesium ~ 2.  Will obtain BMP with am labs and magnesium/phosphorus as warranted.   Pharmacy will continue to monitor and adjust per consult.   Tawnya Crook, PharmD 11/21/2018 1:18 PM

## 2018-11-21 NOTE — Progress Notes (Signed)
Initial Nutrition Assessment  DOCUMENTATION CODES:   Morbid obesity  INTERVENTION:  Initiate Vital High Protein at 15 mL/hr and advance by 15 mL/hr every 8 hours to goal rate of 45 mL/hr (1080 ml goal daily volume). Also provide Pro-Stat 60 mL TID per tube. Provides 1680 kcal, 185 grams of protein, 907 mL H2O daily.  Provide liquid MVI daily per tube.  Provide minimum free water flush of 30 mL Q4hrs to maintain tube patency.  NUTRITION DIAGNOSIS:   Inadequate oral intake related to inability to eat as evidenced by NPO status.  GOAL:   Provide needs based on ASPEN/SCCM guidelines  MONITOR:   Vent status, Labs, Weight trends, TF tolerance, Skin, I & O's  REASON FOR ASSESSMENT:   Ventilator    ASSESSMENT:   58 year old male with PMHx of CHF, A-fib, DM, gout, OSA, HTN admitted with acute hypoxic hypercarbic respiratory failure, bilateral pulmonary effusions, pulmonary edema, compressive atelectasis, was intubated 8/28-8/30, then re-intubated on 31.   Patient intubated and sedated. On PRVC mode with FiO2 60% and PEEP 10 cmH2O. Abdomen distended and taut per RN documentation. Last BM PTA.  Enteral Access: OGT placed 8/31; terminates in distal stomach per abdominal x-ray 8/31; 78 cm at corner of mouth  MAP: 95-108 mmHg  [name] currently intubated on ventilator support Ve: 12.1 L/min Temp (24hrs), Avg:98.3 F (36.8 C), Min:97.7 F (36.5 C), Max:98.8 F (37.1 C)  Propofol: N/A  Medications reviewed and include: allopurinol, Xanax, Novolog 0-15 units Q4hrs, lisinopril, Solu-Medrol 40 mg BID IV, Protonix, Seroquel 25 mg QHS, Precedex gtt, diltiazem gtt, Dilaudid gtt, Versed gtt.  Labs reviewed: CBG 191-207, BUN 60, Creatinine 1.58.  I/O: 1255 mL UOP yesterday (0.3 mL/kg/hr)  Discussed with RN and on rounds. Plan is to start tube feeds today.  NUTRITION - FOCUSED PHYSICAL EXAM:    Most Recent Value  Orbital Region  No depletion  Upper Arm Region  No depletion   Thoracic and Lumbar Region  No depletion  Buccal Region  No depletion  Temple Region  No depletion  Clavicle Bone Region  No depletion  Clavicle and Acromion Bone Region  No depletion  Scapular Bone Region  No depletion  Dorsal Hand  No depletion  Patellar Region  No depletion  Anterior Thigh Region  No depletion  Posterior Calf Region  No depletion  Edema (RD Assessment)  Mild  Hair  Reviewed  Eyes  Unable to assess  Mouth  Unable to assess  Skin  Reviewed  Nails  Reviewed     Diet Order:   Diet Order            Diet NPO time specified  Diet effective now             EDUCATION NEEDS:   No education needs have been identified at this time  Skin:  Skin Assessment: Skin Integrity Issues:(MSAD to abdomen)  Last BM:  PTA  Height:   Ht Readings from Last 1 Encounters:  11/21/18 5' 9.02" (1.753 m)   Weight:   Wt Readings from Last 1 Encounters:  11/21/18 (!) 168.8 kg   Ideal Body Weight:  72.8 kg  BMI:  Body mass index is 54.93 kg/m.  Estimated Nutritional Needs:   Kcal:  1602-1820 (22-25 kcal/kg IBW)  Protein:  182 grams (2.5 grams/kg IBW)  Fluid:  2-2.2 L/day  Willey Blade, MS, RD, LDN Office: [phone] Pager: [phone] After Hours/Weekend Pager: [phone]

## 2018-11-22 ENCOUNTER — Inpatient Hospital Stay: Payer: Medicare Other

## 2018-11-22 LAB — CBC WITH DIFFERENTIAL/PLATELET
Abs Immature Granulocytes: 0.04 10*3/uL (ref 0.00–0.07)
Basophils Absolute: 0 10*3/uL (ref 0.0–0.1)
Basophils Relative: 0 %
Eosinophils Absolute: 0 10*3/uL (ref 0.0–0.5)
Eosinophils Relative: 0 %
HCT: 46.4 % (ref 39.0–52.0)
Hemoglobin: 14.9 g/dL (ref 13.0–17.0)
Immature Granulocytes: 1 %
Lymphocytes Relative: 2 %
Lymphs Abs: 0.2 10*3/uL — ABNORMAL LOW (ref 0.7–4.0)
MCH: 30 pg (ref 26.0–34.0)
MCHC: 32.1 g/dL (ref 30.0–36.0)
MCV: 93.5 fL (ref 80.0–100.0)
Monocytes Absolute: 0.6 10*3/uL (ref 0.1–1.0)
Monocytes Relative: 9 %
Neutro Abs: 6.3 10*3/uL (ref 1.7–7.7)
Neutrophils Relative %: 88 %
Platelets: 156 10*3/uL (ref 150–400)
RBC: 4.96 MIL/uL (ref 4.22–5.81)
RDW: 12.8 % (ref 11.5–15.5)
WBC: 7.2 10*3/uL (ref 4.0–10.5)
nRBC: 0 % (ref 0.0–0.2)

## 2018-11-22 LAB — GLUCOSE, CAPILLARY
Glucose-Capillary: 201 mg/dL — ABNORMAL HIGH (ref 70–99)
Glucose-Capillary: 205 mg/dL — ABNORMAL HIGH (ref 70–99)
Glucose-Capillary: 227 mg/dL — ABNORMAL HIGH (ref 70–99)
Glucose-Capillary: 242 mg/dL — ABNORMAL HIGH (ref 70–99)
Glucose-Capillary: 284 mg/dL — ABNORMAL HIGH (ref 70–99)
Glucose-Capillary: 296 mg/dL — ABNORMAL HIGH (ref 70–99)

## 2018-11-22 LAB — BASIC METABOLIC PANEL
Anion gap: 10 (ref 5–15)
BUN: 65 mg/dL — ABNORMAL HIGH (ref 6–20)
CO2: 28 mmol/L (ref 22–32)
Calcium: 8.7 mg/dL — ABNORMAL LOW (ref 8.9–10.3)
Chloride: 103 mmol/L (ref 98–111)
Creatinine, Ser: 1.51 mg/dL — ABNORMAL HIGH (ref 0.61–1.24)
GFR calc Af Amer: 58 mL/min — ABNORMAL LOW (ref >60)
GFR calc non Af Amer: 50 mL/min — ABNORMAL LOW (ref >60)
Glucose, Bld: 304 mg/dL — ABNORMAL HIGH (ref 70–99)
Potassium: 4.7 mmol/L (ref 3.5–5.1)
Sodium: 141 mmol/L (ref 135–145)

## 2018-11-22 LAB — MAGNESIUM
Magnesium: 3.1 mg/dL — ABNORMAL HIGH (ref 1.7–2.4)
Magnesium: 3.3 mg/dL — ABNORMAL HIGH (ref 1.7–2.4)

## 2018-11-22 LAB — BLOOD GAS, ARTERIAL
Acid-Base Excess: 10.5 mmol/L — ABNORMAL HIGH (ref 0.0–2.0)
Bicarbonate: 35.1 mmol/L — ABNORMAL HIGH (ref 20.0–28.0)
FIO2: 100
MECHVT: 500 mL
Mechanical Rate: 24
O2 Saturation: 94 %
PEEP: 12 cmH2O
Patient temperature: 37
pCO2 arterial: 45 mmHg (ref 32.0–48.0)
pH, Arterial: 7.5 — ABNORMAL HIGH (ref 7.350–7.450)
pO2, Arterial: 64 mmHg — ABNORMAL LOW (ref 83.0–108.0)

## 2018-11-22 LAB — PROTIME-INR
INR: 2.9 — ABNORMAL HIGH (ref 0.8–1.2)
Prothrombin Time: 29.5 seconds — ABNORMAL HIGH (ref 11.4–15.2)

## 2018-11-22 LAB — PHOSPHORUS: Phosphorus: 5.3 mg/dL — ABNORMAL HIGH (ref 2.5–4.6)

## 2018-11-22 LAB — CULTURE, BLOOD (ROUTINE X 2)
Culture: NO GROWTH
Culture: NO GROWTH
Special Requests: ADEQUATE
Special Requests: ADEQUATE

## 2018-11-22 LAB — TROPONIN I (HIGH SENSITIVITY): Troponin I (High Sensitivity): 34 ng/L — ABNORMAL HIGH (ref <18)

## 2018-11-22 LAB — POTASSIUM: Potassium: 5.5 mmol/L — ABNORMAL HIGH (ref 3.5–5.1)

## 2018-11-22 LAB — BRAIN NATRIURETIC PEPTIDE: B Natriuretic Peptide: 177 pg/mL — ABNORMAL HIGH (ref 0.0–100.0)

## 2018-11-22 MED ORDER — AMIODARONE HCL IN DEXTROSE 360-4.14 MG/200ML-% IV SOLN
30.0000 mg/h | INTRAVENOUS | Status: DC
  Administered 2018-11-23 – 2018-11-24 (×3): 30 mg/h via INTRAVENOUS
  Administered 2018-11-24 – 2018-11-30 (×23): 60 mg/h via INTRAVENOUS
  Administered 2018-11-30: 30 mg/h via INTRAVENOUS
  Filled 2018-11-22 (×28): qty 200

## 2018-11-22 MED ORDER — SODIUM CHLORIDE 0.9 % IV SOLN
2.0000 g | Freq: Three times a day (TID) | INTRAVENOUS | Status: DC
  Administered 2018-11-22 – 2018-11-27 (×15): 2 g via INTRAVENOUS
  Filled 2018-11-22 (×17): qty 2

## 2018-11-22 MED ORDER — DIPHENHYDRAMINE HCL 50 MG/ML IJ SOLN
50.0000 mg | Freq: Once | INTRAMUSCULAR | Status: AC
  Administered 2018-11-22: 17:00:00 50 mg via INTRAVENOUS
  Filled 2018-11-22: qty 1

## 2018-11-22 MED ORDER — SODIUM CHLORIDE 0.9 % IV SOLN
0.0000 ug/min | INTRAVENOUS | Status: DC
  Administered 2018-11-22: 50 ug/min via INTRAVENOUS
  Filled 2018-11-22: qty 40
  Filled 2018-11-22 (×2): qty 4

## 2018-11-22 MED ORDER — INSULIN DETEMIR 100 UNIT/ML ~~LOC~~ SOLN: 10.0000 [IU] | Freq: Every day | SUBCUTANEOUS | Status: DC

## 2018-11-22 MED ORDER — INSULIN DETEMIR 100 UNIT/ML ~~LOC~~ SOLN
10.0000 [IU] | Freq: Every day | SUBCUTANEOUS | Status: DC
  Administered 2018-11-22 – 2018-11-27 (×6): 10 [IU] via SUBCUTANEOUS
  Filled 2018-11-22 (×6): qty 0.1

## 2018-11-22 MED ORDER — AMIODARONE IV BOLUS ONLY 150 MG/100ML
150.0000 mg | Freq: Once | INTRAVENOUS | Status: DC
  Administered 2018-11-22: 21:00:00 150 mg via INTRAVENOUS

## 2018-11-22 MED ORDER — SENNOSIDES-DOCUSATE SODIUM 8.6-50 MG PO TABS
2.0000 | ORAL_TABLET | Freq: Two times a day (BID) | ORAL | Status: DC
  Administered 2018-11-22 – 2018-11-27 (×11): 2 via ORAL
  Filled 2018-11-22 (×11): qty 2

## 2018-11-22 MED ORDER — AMIODARONE IV BOLUS ONLY 150 MG/100ML
INTRAVENOUS | Status: AC
  Administered 2018-11-22: 21:00:00 150 mg via INTRAVENOUS
  Filled 2018-11-22: qty 100

## 2018-11-22 MED ORDER — NOREPINEPHRINE 4 MG/250ML-% IV SOLN
0.0000 ug/min | INTRAVENOUS | Status: DC
  Administered 2018-11-22 – 2018-11-23 (×2): 2 ug/min via INTRAVENOUS
  Filled 2018-11-22 (×2): qty 250

## 2018-11-22 MED ORDER — AMIODARONE LOAD VIA INFUSION
150.0000 mg | Freq: Once | INTRAVENOUS | Status: AC
  Administered 2018-11-22: 150 mg via INTRAVENOUS
  Filled 2018-11-22: qty 83.34

## 2018-11-22 MED ORDER — SODIUM POLYSTYRENE SULFONATE 15 GM/60ML PO SUSP
45.0000 g | Freq: Once | ORAL | Status: AC
  Administered 2018-11-22: 23:00:00 45 g
  Filled 2018-11-22: qty 180

## 2018-11-22 MED ORDER — INSULIN ASPART 100 UNIT/ML ~~LOC~~ SOLN
4.0000 [IU] | SUBCUTANEOUS | Status: DC
  Administered 2018-11-22 – 2018-11-27 (×31): 4 [IU] via SUBCUTANEOUS
  Filled 2018-11-22 (×29): qty 1

## 2018-11-22 MED ORDER — AMIODARONE HCL IN DEXTROSE 360-4.14 MG/200ML-% IV SOLN
60.0000 mg/h | INTRAVENOUS | Status: AC
  Administered 2018-11-22: 60 mg/h via INTRAVENOUS
  Filled 2018-11-22: qty 200

## 2018-11-22 MED ORDER — VITAL HIGH PROTEIN PO LIQD
1000.0000 mL | ORAL | Status: DC
  Administered 2018-11-22 – 2018-11-23 (×2): 1000 mL

## 2018-11-22 NOTE — Progress Notes (Addendum)
Inpatient Diabetes Program Recommendations  AACE/ADA: New Consensus Statement on Inpatient Glycemic Control (2015)  Target Ranges:  Prepandial:   less than 140 mg/dL      Peak postprandial:   less than 180 mg/dL (1-2 hours)      Critically ill patients:  140 - 180 mg/dL   Lab Results  Component Value Date   GLUCAP 284 (H) 11/22/2018   HGBA1C 7.3 (H) 11/17/2018    Review of Glycemic Control  Results for ELFORD, HISSAM (MRN [id number]) as of 11/22/2018 10:16  Ref. Range 11/21/2018 07:27 11/21/2018 11:14 11/21/2018 16:45 11/21/2018 19:48 11/21/2018 23:46 11/22/2018 04:11 11/22/2018 07:34  Glucose-Capillary Latest Ref Range: 70 - 99 mg/dL 191 (H) 191 (H) 226 (H) 230 (H) 275 (H) 296 (H) 284 (H)   Diabetes history: DM 2 Outpatient Diabetes medications: Metformin 1000 mg bid Current orders for Inpatient glycemic control:  Novolog Moderate 0-15 units Q4 hours  Inpatient Diabetes Program Recommendations:    Solumedrol decreased to 40 mg bid 8/31 Glucose trends elevated. Renal function slightly elevated.  Vital HP Tube Feeds 45 ml/hour Bronch early this am   -  If patient continues steroids at current dose, consider Levemir 10 units (less than 0-1 units/kg).  -  Novolog 4 units Q4 hours Tube Feed Coverage   (do not give if tube feeds are stopped or held)  Will need decrease if extubated.  Thanks,  Tama Headings RN, MSN, BC-ADM Inpatient Diabetes Coordinator Team Pager [phone] (8a-5p)

## 2018-11-22 NOTE — Progress Notes (Signed)
Palliative: Kyle White remains critically ill. He is intubated and now on 100% FiO2.  Conference with CCM attending and NP related to patient condition, needs and family communication.   PMT to continue to follow peripherally.   Plan:  FULL scope/code. Continue code status discussions.   No charge  Quinn Axe, NP Palliative Medicine Team Team Phone # [phone] Greater than 50% of this time was spent counseling and coordinating care related to the above assessment and plan.

## 2018-11-22 NOTE — Progress Notes (Signed)
Assisted Dr. Mortimer Fries with Bedside Bronchoscopy. Bronchoscope number used [id number]. Time scope in: 0742. Time Scope out: [id number]. Patient tolerated procedure fairly well. Will  Continue to monitor.

## 2018-11-22 NOTE — Progress Notes (Signed)
[place] [facility] [place] NAME: Kyle White    MR#:  [id number]  DATE OF BIRTH:  07-16-1960  SUBJECTIVE:  patient remains intubated on the ventilator on 100% Fio2 underwent bronchoscopy today  REVIEW OF SYSTEMS:   Review of Systems  Unable to perform ROS: Intubated   DRUG ALLERGIES:  No Known Allergies  VITALS:  Blood pressure 107/80, pulse (!) 110, temperature 99.4 F (37.4 C), temperature source Oral, resp. rate (!) 24, height 5' 9.02" (1.753 m), weight (!) 207.3 kg, SpO2 (!) 88 %.  PHYSICAL EXAMINATION:   Physical Exam  GENERAL:  58 y.o.-year-old patient lying in the bed with no acute distress. Morbidly obese critically ill intubated on the vent EYES: Pupils equal, round, reactive to light and accommodation. No scleral icterus. Extraocular muscles intact.  HEENT: Head atraumatic, normocephalic. Oropharynx and nasopharynx clear.  NECK:  Supple, no jugular venous distention. No thyroid enlargement, no tenderness.  LUNGS: Normal breath sounds bilaterally, no wheezing, rales, rhonchi. No use of accessory muscles of respiration.  CARDIOVASCULAR: S1, S2 normal. No murmurs, rubs, or gallops.  ABDOMEN: Soft, nontender, nondistended. Bowel sounds present. No organomegaly or mass.  EXTREMITIES: No cyanosis, clubbing or edema b/l.    NEUROLOGIC: sedated  PSYCHIATRIC: sedated  And on the vent SKIN: No obvious rash, lesion, or ulcer.   LABORATORY PANEL:  CBC Recent Labs  Lab 11/22/18 0429  WBC 7.2  HGB 14.9  HCT 46.4  PLT 156    Chemistries  Recent Labs  Lab 11/17/18 0029  11/22/18 0429  NA 136   < > 141  K 4.8   < > 4.7  CL 94*   < > 103  CO2 31   < > 28  GLUCOSE 172*   < > 304*  BUN 34*   < > 65*  CREATININE 1.28*   < > 1.51*  CALCIUM 8.7*   < > 8.7*  MG 2.6*   < > 3.1*  AST 20  --   --   ALT 25  --   --   ALKPHOS 64  --   --   BILITOT 1.0  --   --    < > = values in this interval not displayed.   Cardiac  Enzymes No results for input(s): TROPONINI in the last 168 hours. RADIOLOGY:  Dg Chest Port 1 View  Result Date: 11/22/2018 CLINICAL DATA:  Central line placement EXAM: PORTABLE CHEST 1 VIEW COMPARISON:  11/22/2018 FINDINGS: [name] rotated. Interval placement of a right IJ central venous catheter with distal tip terminating at the expected level of the superior cavoatrial junction. ET tube remains in place with distal tip terminating 3.8 cm superior to the carina. Enteric tube courses below the diaphragm with distal tip beyond the inferior margin of the film. Multiple overlying external leads. Cardiac silhouette is largely obscured but appears enlarged. Bilateral pleural effusions. Volume loss within the right lung. Slight improved aeration of the right lung compared to prior. No pneumothorax visualized. IMPRESSION: 1. Interval placement of right IJ central venous catheter. No pneumothorax. 2. Improving aeration of the right lung. Electronically Signed   By: Davina Poke M.D.   On: 11/22/2018 14:26   Dg Chest Port 1 View  Result Date: 11/22/2018 CLINICAL DATA:  Multiple tracheobronchial mucus plugs, history CHF, diabetes mellitus, atrial fibrillation, hypertension EXAM: PORTABLE CHEST 1 VIEW COMPARISON:  Portable exam 0830 hours compared to 0035 hours FINDINGS: Tip of endotracheal tube projects 4.1  cm above carina. Nasogastric tube extends into abdomen. Volume loss in the RIGHT hemithorax with slight mediastinal shift to the RIGHT, likely reflecting a combination of atelectasis and question effusion. LEFT pleural effusion and basilar atelectasis are present. No pneumothorax. Bones demineralized. IMPRESSION: Atelectasis and probable pleural effusion opacified RIGHT hemithorax with volume loss. Persistent LEFT pleural effusion and basilar atelectasis. Electronically Signed   By: Lavonia Dana M.D.   On: 11/22/2018 08:44   Dg Chest Port 1 View  Result Date: 11/22/2018 CLINICAL DATA:  Hypoxia. EXAM:  PORTABLE CHEST 1 VIEW COMPARISON:  Radiograph yesterday at 0550 hour FINDINGS: Endotracheal tube tip 3.6 cm from the carina. Enteric tube in place tip not well visualized. Complete opacification of the right hemithorax which is new from prior exam. Heart size and mediastinal contours are obscured. Left pleural effusion with volume loss in the left lung. Congestive changes on the left. IMPRESSION: 1. Complete opacification of the right hemithorax which is new from prior exam. Question underlying mucous plugging. 2. Overall low lung volumes. 3. Support apparatus unchanged. Electronically Signed   By: Keith Rake M.D.   On: 11/22/2018 01:11   Dg Chest Port 1 View  Result Date: 11/21/2018 CLINICAL DATA:  Acute respiratory failure. EXAM: PORTABLE CHEST 1 VIEW COMPARISON:  Radiograph of November 19, 2018. FINDINGS: Stable cardiomegaly. Endotracheal and nasogastric tubes are unchanged in position. No pneumothorax is noted. Probable mild to moderate size left pleural effusion is noted with associated atelectasis or infiltrate. Minimal right basilar atelectasis may be present. Bony thorax unremarkable. IMPRESSION: Stable support apparatus. Increased left basilar density is noted concerning for mild to moderate pleural effusion with probable underlying atelectasis or infiltrate. Electronically Signed   By: Marijo Conception M.D.   On: 11/21/2018 08:39   ASSESSMENT AND PLAN:  Kyle White  is a 58 y.o. male with a known history of atrial fibrillation on Coumadin, CHF, diabetes mellitus, and obstructive sleep apnea with CPAP use at home, morbid obesity with sedentary lifestyle on oxygen at 2 L/min at home.  1.Acute on chronic hypoxic respiratory failure - Extubated on 8/31 but required reintubation, vent management per PCCM team -patient underwent bronchoscopy today. 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 -full went support with hundred percent of FI02--- very poor prognosis  2.Obstructive sleep apnea--  chronic  3. Acute on chronic diastolic CHF, EF 55 to [id number] on echo in 2014 at [facility] -Repeat echocardiogram shows EF of 55 to 60%  4. History of atrial fibrillation -Continue Coumadin - INR 3.8  5. Acute renal failure with creatinine 1.28 -Repeat BMP in the a.m. and continue to monitor renal function closely  6.  Type IIDiabetes mellitus -Sliding scale insulin -Hemoglobin A1c 7.3  7. Generalized weakness -Physical therapy eval once extubated  8.  Abdominal distention -No acute pathology on CT scan or KUB performed  -Could be ileus, NG tube in place   Overall poor prognosis. Appreciate palliative care input CODE STATUS: FULL  DVT Prophylaxis: Lovenox  TOTAL TIME TAKING CARE OF THIS PATIENT: 25  minutes.  >50% time spent on counselling and coordination of care  POSSIBLE D/C IN *?* DAYS, DEPENDING ON CLINICAL CONDITION.  Note: This dictation was prepared with Dragon dictation along with smaller phrase technology. Any transcriptional errors that result from this process are unintentional.  Fritzi Mandes M.D on 11/22/2018 at 2:39 PM  Between 7am to 6pm - Pager - [phone]  After 6pm go to [email or web address] - password [email or web address]  [employer]  Office  [phone]  CC: Primary care physician; [facility] ServicesPatient ID: Kyle White, male   DOB: Dec 11, 1960, 58 y.o.   MRN: [id number]

## 2018-11-22 NOTE — Progress Notes (Signed)
CRITICAL CARE NOTE  58 yo w/hx of morbid obesity, HFpEF hx MVR, CHF, PAF, OSA, DM, AHRF came in with acute hypoxemic hypercarbic resp failure with CXR showing b/l pl effusions and pulm edema. CT with restrictive physiology, pulm edema, bilateral pleural effusions and compressive atelectasiss/p extubation on bIPAP Re-intubated 8/31   CC  follow up respiratory failure  SUBJECTIVE Patient remains critically ill Prognosis is guarded Severe hypxia     Plan for bronch today   BP (!) 117/93   Pulse (!) 46   Temp 97.6 F (36.4 C) (Oral)   Resp (!) 24   Ht 5' 9.02" (1.753 m)   Wt (!) 207.3 kg   SpO2 92%   BMI 67.46 kg/m    I/O last 3 completed shifts: In: 1940.5 [I.V.:1180.7; NG/GT:759.8] Out: 3300 [Urine:2900; Emesis/NG output:400] No intake/output data recorded.  SpO2: 92 % O2 Flow Rate (L/min): 3 L/min FiO2 (%): 100 %  Vent Mode: PRVC FiO2 (%):  [35 %-100 %] 100 % Set Rate:  [24 bmp] 24 bmp Vt Set:  [500 mL] 500 mL PEEP:  [10 XVQ00-86 cmH20] 12 cmH20 Plateau Pressure:  [15 cmH20-22 cmH20] 15 cmH20  CBC    Component Value Date/Time   WBC 7.2 11/22/2018 0429   RBC 4.96 11/22/2018 0429   HGB 14.9 11/22/2018 0429   HCT 46.4 11/22/2018 0429   PLT 156 11/22/2018 0429   MCV 93.5 11/22/2018 0429   MCH 30.0 11/22/2018 0429   MCHC 32.1 11/22/2018 0429   RDW 12.8 11/22/2018 0429   LYMPHSABS 0.2 (L) 11/22/2018 0429   MONOABS 0.6 11/22/2018 0429   EOSABS 0.0 11/22/2018 0429   BASOSABS 0.0 11/22/2018 0429   BMP Latest Ref Rng & Units 11/22/2018 11/21/2018 11/20/2018  Glucose 70 - 99 mg/dL 304(H) 210(H) 197(H)  BUN 6 - 20 mg/dL 65(H) 60(H) 52(H)  Creatinine 0.61 - 1.24 mg/dL 1.51(H) 1.58(H) 1.28(H)  Sodium 135 - 145 mmol/L 141 138 141  Potassium 3.5 - 5.1 mmol/L 4.7 4.1 3.9  Chloride 98 - 111 mmol/L 103 99 102  CO2 22 - 32 mmol/L 28 28 28   Calcium 8.9 - 10.3 mg/dL 8.7(L) 8.7(L) 8.7(L)     SIGNIFICANT EVENTS 8/29 admitted for severe resp failure CHF exacerbation  8/30 extubated 8/31 re-intubated, PICC line placed 9/2 severe hypoxia 9/3 RT hemithorax opacification plan for bronch  REVIEW OF SYSTEMS  [name] UNABLE TO PROVIDE COMPLETE REVIEW OF SYSTEMS DUE TO SEVERE CRITICAL ILLNESS   PHYSICAL EXAMINATION:  GENERAL:critically ill appearing, +resp distress HEAD: Normocephalic, atraumatic.  EYES: Pupils equal, round, reactive to light.  No scleral icterus.  MOUTH: Moist mucosal membrane. NECK: Supple.  PULMONARY: +rhonchi, +wheezing CARDIOVASCULAR: S1 and S2. Regular rate and rhythm. No murmurs, rubs, or gallops.  GASTROINTESTINAL: Soft, nontender, -distended. No masses. Positive bowel sounds. No hepatosplenomegaly.  MUSCULOSKELETAL: No swelling, clubbing, or edema.  NEUROLOGIC: obtunded, GCS<8 SKIN:intact,warm,dry  MEDICATIONS: I have reviewed all medications and confirmed regimen as documented   CULTURE RESULTS   Recent Results (from the past 240 hour(s))  SARS CORONAVIRUS 2 (TAT 6-12 HRS) Nasal Swab Aptima Multi Swab     Status: None   Collection Time: 11/19/2018  4:55 PM   Specimen: Aptima Multi Swab; Nasal Swab  Result Value Ref Range Status   SARS Coronavirus 2 NEGATIVE NEGATIVE Final    Comment: (NOTE) SARS-CoV-2 target nucleic acids are NOT DETECTED. The SARS-CoV-2 RNA is generally detectable in upper and lower respiratory specimens during the acute phase of infection. Negative results do  not preclude SARS-CoV-2 infection, do not rule out co-infections with other pathogens, and should not be used as the sole basis for treatment or other patient management decisions. Negative results must be combined with clinical observations, patient history, and epidemiological information. The expected result is Negative. Fact Sheet for Patients: SugarRoll.be Fact Sheet for Healthcare Providers: https://www.woods-mathews.com/ This test is not yet approved or cleared by the [place] FDA and   has been authorized for detection and/or diagnosis of SARS-CoV-2 by FDA under an Emergency Use Authorization (EUA). This EUA will remain  in effect (meaning this test can be used) for the duration of the COVID-19 declaration under Section 56 4(b)(1) of the Act, 21 U.S.C. section 360bbb-3(b)(1), unless the authorization is terminated or revoked sooner. Performed at [facility], [place] [address] [place], [place] [address]   SARS Coronavirus 2 [facility] order, Performed in [facility] hospital lab) Nasopharyngeal Nasopharyngeal Swab     Status: None   Collection Time: 10/23/2018 10:34 PM   Specimen: Nasopharyngeal Swab  Result Value Ref Range Status   SARS Coronavirus 2 NEGATIVE NEGATIVE Final    Comment: (NOTE) If result is NEGATIVE SARS-CoV-2 target nucleic acids are NOT DETECTED. The SARS-CoV-2 RNA is generally detectable in upper and lower  respiratory specimens during the acute phase of infection. The lowest  concentration of SARS-CoV-2 viral copies this assay can detect is 250  copies / mL. A negative result does not preclude SARS-CoV-2 infection  and should not be used as the sole basis for treatment or other  patient management decisions.  A negative result may occur with  improper specimen collection / handling, submission of specimen other  than nasopharyngeal swab, presence of viral mutation(s) within the  areas targeted by this assay, and inadequate number of viral copies  (<250 copies / mL). A negative result must be combined with clinical  observations, patient history, and epidemiological information. If result is POSITIVE SARS-CoV-2 target nucleic acids are DETECTED. The SARS-CoV-2 RNA is generally detectable in upper and lower  respiratory specimens dur ing the acute phase of infection.  Positive  results are indicative of active infection with SARS-CoV-2.  Clinical  correlation with patient history and other diagnostic information is  necessary to determine  patient infection status.  Positive results do  not rule out bacterial infection or co-infection with other viruses. If result is PRESUMPTIVE POSTIVE SARS-CoV-2 nucleic acids MAY BE PRESENT.   A presumptive positive result was obtained on the submitted specimen  and confirmed on repeat testing.  While 2019 novel coronavirus  (SARS-CoV-2) nucleic acids may be present in the submitted sample  additional confirmatory testing may be necessary for epidemiological  and / or clinical management purposes  to differentiate between  SARS-CoV-2 and other Sarbecovirus currently known to infect humans.  If clinically indicated additional testing with an alternate test  methodology [phone]) is advised. The SARS-CoV-2 RNA is generally  detectable in upper and lower respiratory sp ecimens during the acute  phase of infection. The expected result is Negative. Fact Sheet for Patients:  StrictlyIdeas.no Fact Sheet for Healthcare Providers: BankingDealers.co.za This test is not yet approved or cleared by the [place] FDA and has been authorized for detection and/or diagnosis of SARS-CoV-2 by FDA under an Emergency Use Authorization (EUA).  This EUA will remain in effect (meaning this test can be used) for the duration of the COVID-19 declaration under Section 564(b)(1) of the Act, 21 U.S.C. section 360bbb-3(b)(1), unless the authorization is terminated or revoked  sooner. Performed at [facility], [address] [place], [place] [address]   Culture, respiratory (non-expectorated)     Status: None   Collection Time: 11/14/2018 10:34 PM   Specimen: Tracheal Aspirate; Respiratory  Result Value Ref Range Status   Specimen Description   Final    TRACHEAL ASPIRATE Performed at [facility], [address] [place], [place] [address]    Special Requests   Final    NONE Performed at [facility], [address]  [place], [place] [address]    Gram Stain   Final    RARE WBC PRESENT, PREDOMINANTLY PMN ABUNDANT GRAM POSITIVE COCCI IN CHAINS    Culture   Final    Consistent with normal respiratory flora. Performed at [facility], [place] [address], [place], [place] [address]    Report Status 11/19/2018 FINAL  Final  Urine Culture     Status: None   Collection Time: 11/17/2018 11:12 PM   Specimen: Urine, Random  Result Value Ref Range Status   Specimen Description   Final    URINE, RANDOM Performed at [facility], [address], [place], [place] [address]    Special Requests   Final    NONE Performed at [facility], [address], [place], [place] [address]    Culture   Final    NO GROWTH Performed at [facility], [place] [address] [place], [place] [address]    Report Status 11/18/2018 FINAL  Final  MRSA PCR Screening     Status: None   Collection Time: 10/31/2018 11:28 PM   Specimen: Nasopharyngeal  Result Value Ref Range Status   MRSA by PCR NEGATIVE NEGATIVE Final    Comment:        The GeneXpert MRSA Assay (FDA approved for NASAL specimens only), is one component of a comprehensive MRSA colonization surveillance program. It is not intended to diagnose MRSA infection nor to guide or monitor treatment for MRSA infections. Performed at [facility], [address] [place], [place] [address]   Culture, blood (Routine X 2) w Reflex to ID Panel     Status: None (Preliminary result)   Collection Time: 11/17/18 12:19 AM   Specimen: BLOOD  Result Value Ref Range Status   Specimen Description BLOOD RIGHT ANTECUBITAL  Final   Special Requests   Final    BOTTLES DRAWN AEROBIC AND ANAEROBIC Blood Culture adequate volume   Culture   Final    NO GROWTH 4 DAYS Performed at [facility], [address] [place], [place] [address]    Report Status PENDING  Incomplete  Culture, blood (Routine X 2) w Reflex to ID Panel     Status: None  (Preliminary result)   Collection Time: 11/17/18 12:31 AM   Specimen: BLOOD  Result Value Ref Range Status   Specimen Description BLOOD CENTRAL LINE MIDLINE  Final   Special Requests   Final    BOTTLES DRAWN AEROBIC AND ANAEROBIC Blood Culture adequate volume   Culture   Final    NO GROWTH 4 DAYS Performed at [facility], [address] [place],  [address]    Report Status PENDING  Incomplete          IMAGING    Dg Chest Port 1 View  Result Date: 11/22/2018 CLINICAL DATA:  Hypoxia. EXAM: PORTABLE CHEST 1 VIEW COMPARISON:  Radiograph yesterday at 0550 hour FINDINGS: Endotracheal tube tip 3.6 cm from the carina. Enteric  tube in place tip not well visualized. Complete opacification of the right hemithorax which is new from prior exam. Heart size and mediastinal contours are obscured. Left pleural effusion with volume loss in the left lung. Congestive changes on the left. IMPRESSION: 1. Complete opacification of the right hemithorax which is new from prior exam. Question underlying mucous plugging. 2. Overall low lung volumes. 3. Support apparatus unchanged. Electronically Signed   By: Keith Rake M.D.   On: 11/22/2018 01:11       Indwelling Urinary Catheter continued, requirement due to   Reason to continue Indwelling Urinary Catheter strict Intake/Output monitoring for hemodynamic instability   Central Line/ continued, requirement due to  Reason to continue [place] of central venous pressure or other hemodynamic parameters and poor IV access   Ventilator continued, requirement due to severe respiratory failure   Ventilator Sedation RASS 0 to -2      ASSESSMENT AND PLAN SYNOPSIS  Severe ACUTE Hypoxic and Hypercapnic Respiratory Failure from pulm edema with effusions and progressiove renal failure with OHS and OSA with morbid obesity  Severe ACUTE Hypoxic and Hypercapnic Respiratory Failure -continue Full MV support -continue  Bronchodilator Therapy -Wean Fio2 and PEEP as tolerated -will perform SAT/SBT when respiratory parameters are met unable to wean from vent due to severe hypoxia Plan for bronch today   ACUTE SYSTOLIC CARDIAC FAILURE- HFpEF -oxygen as needed -Lasix as tolerated   ACUTE KIDNEY INJURY/Renal Failure -follow chem 7 -follow UO -continue Foley Catheter-assess need -Avoid nephrotoxic agents -Recheck creatinine   NEUROLOGY - intubated and sedated - minimal sedation to achieve a RASS goal: -1   CARDIAC ICU monitoring   GI GI PROPHYLAXIS as indicated  NUTRITIONAL STATUS DIET-->TF's as tolerated Constipation protocol as indicated  ENDO - will use ICU hypoglycemic\Hyperglycemia protocol if indicated   ELECTROLYTES -follow labs as needed -replace as needed -pharmacy consultation and following   DVT/GI PRX ordered TRANSFUSIONS AS NEEDED MONITOR FSBS ASSESS the need for LABS as needed   Critical Care Time devoted to patient care services described in this note is 45  minutes.   Overall, patient is critically ill, prognosis is guarded.  Patient with Multiorgan failure and at high risk for cardiac arrest and death.    Corrin Parker, M.D.  [facility]  Medical Director [place] Director [facility] Cardio-Pulmonary Department

## 2018-11-22 NOTE — Progress Notes (Signed)
Pharmacy Electrolyte Monitoring Consult:  Pharmacy consulted to assist in monitoring and replacing electrolytes in this 57 y.o. male admitted on 11/15/2018. Patient admitted to ICU on 8/29 and intubated. Patient with past medical history significant for atrial fibrillation, CHF, diabetes, gout, hypertension, obesity, and sleep apnea.   Labs:  Sodium (mmol/L)  Date Value  11/22/2018 141   Potassium (mmol/L)  Date Value  11/22/2018 4.7   Magnesium (mg/dL)  Date Value  11/22/2018 3.1 (H)   Phosphorus (mg/dL)  Date Value  11/22/2018 5.3 (H)   Calcium (mg/dL)  Date Value  11/22/2018 8.7 (L)   Albumin (g/dL)  Date Value  11/17/2018 3.5    Assessment/Plan: No electrolyte replacement warranted.  Will replace for goal potassium ~ 4 and goal magnesium ~ 2.  Will obtain BMP with am labs and magnesium/phosphorus as warranted.   Pharmacy will continue to monitor and adjust per consult.   Tawnya Crook, PharmD 11/22/2018 12:04 PM

## 2018-11-22 NOTE — Progress Notes (Signed)
Pharmacy Antibiotic Note  Kyle White is a 58 y.o. male admitted on 11/18/2018. Bronchoscopy performed 9/3 am with culture sent. Pharmacy has been consulted for cefepime dosing.  Plan: Cefepime 2 g IV q8h  Height: 5' 9.02" (175.3 cm) Weight: (!) 457 lb (207.3 kg) IBW/kg (Calculated) : 70.74  Temp (24hrs), Avg:97.7 F (36.5 C), Min:97.3 F (36.3 C), Max:98 F (36.7 C)  Recent Labs  Lab 11/17/18 0029 11/17/18 0423 11/18/18 0501 11/19/18 0514 11/20/18 0416 11/21/18 0346 11/22/18 0429  WBC 7.7  --  6.1 8.9 6.2 6.5 7.2  CREATININE 1.28* 1.35* 1.36* 1.72* 1.28* 1.58* 1.51*  LATICACIDVEN 2.3* 2.0*  --   --   --   --   --     Estimated Creatinine Clearance: 93.4 mL/min (A) (by C-G formula based on SCr of 1.51 mg/dL (H)).    No Known Allergies  Antimicrobials this admission: Cefepime 9/3 >>  Dose adjustments this admission: NA  Microbiology results: 8/29 BCx: no growth 8/28 UCx: no growth  8/28 Respiratory (tracheal aspirate): normal flora 9/3   Respiratory (BAL): pending  8/28 MRSA PCR: negative  Thank you for allowing pharmacy to be a part of this patient's care.  Tawnya Crook, PharmD 11/22/2018 12:14 PM

## 2018-11-22 NOTE — Consult Note (Signed)
ANTICOAGULATION CONSULT NOTE  Pharmacy Consult for Heparin Drip Indication: atrial fibrillation  No Known Allergies  Patient Measurements: Height: 5' 9.02" (175.3 cm) Weight: (!) 457 lb (207.3 kg) IBW/kg (Calculated) : 70.74 Heparin Dosing Weight: 113.8 kg  Vital Signs: Temp: 97.6 F (36.4 C) (09/03 0400) Temp Source: Oral (09/03 0400) BP: 114/77 (09/03 0700) Pulse Rate: 122 (09/03 0700)  Labs: Recent Labs    11/20/18 0416 11/21/18 0346 11/22/18 0429  HGB 13.4 14.1 14.9  HCT 41.9 43.1 46.4  PLT 159 164 156  LABPROT 36.9* 35.5* 29.5*  INR 3.8* 3.6* 2.9*  CREATININE 1.28* 1.58* 1.51*    Estimated Creatinine Clearance: 93.4 mL/min (A) (by C-G formula based on SCr of 1.51 mg/dL (H)).   Medical History: Past Medical History:  Diagnosis Date  . Atrial fibrillation ([place])   . CHF (congestive heart failure) ([place])   . Diabetes mellitus without complication ([place])   . Gout   . Hypertension associated with type 2 diabetes mellitus ([place])   . Morbid obesity ([place])   . Obstructive sleep apnea     Medications:  Medications Prior to Admission  Medication Sig Dispense Refill Last Dose  . aspirin EC 81 MG tablet Take 81 mg by mouth daily.   11/15/2018 at Unknown time  . colchicine 0.6 MG tablet Take 12 mg by mouth. At first sing of gout flare   prn at prn  . diltiazem (CARDIZEM CD) 240 MG 24 hr capsule Take 240 mg by mouth daily.   11/15/2018 at Unknown time  . furosemide (LASIX) 40 MG tablet Take 40 mg by mouth 2 (two) times daily.   11/15/2018 at Unknown time  . lisinopril (ZESTRIL) 5 MG tablet Take 5 mg by mouth daily.   11/15/2018 at Unknown time  . metFORMIN (GLUCOPHAGE) 500 MG tablet Take 500 mg by mouth 2 (two) times daily with a meal.   11/15/2018 at Unknown time  . metoprolol tartrate (LOPRESSOR) 100 MG tablet Take 50 mg by mouth 2 (two) times daily.   11/15/2018 at Unknown time  . pravastatin (PRAVACHOL) 80 MG tablet Take 80 mg by mouth every evening.   11/15/2018 at Unknown  time  . warfarin (COUMADIN) 4 MG tablet Take 2.5-4 mg by mouth daily. Take 4 mg on Sunday, Tuesday, Wednesday, Thursday, and Saturday. Take 2.5 mg on Monday and Friday   11/15/2018 at Unknown time   Scheduled:  . allopurinol  100 mg Per Tube Daily  . ALPRAZolam  0.5 mg Per Tube BID  . aspirin  81 mg Per NG tube Daily  . chlorhexidine gluconate (MEDLINE KIT)  15 mL Mouth Rinse BID  . Chlorhexidine Gluconate Cloth  6 each Topical Daily  . diltiazem  240 mg Oral Daily  . feeding supplement (PRO-STAT SUGAR FREE 64)  60 mL Per Tube TID  . insulin aspart  0-15 Units Subcutaneous Q4H  . insulin aspart  4 Units Subcutaneous Q4H  . insulin detemir  10 Units Subcutaneous Daily  . ipratropium-albuterol  3 mL Nebulization Q4H  . lisinopril  5 mg Per NG tube Daily  . mouth rinse  15 mL Mouth Rinse 10 times per day  . methylPREDNISolone (SOLU-MEDROL) injection  40 mg Intravenous BID  . metoprolol tartrate  50 mg Per NG tube BID  . multivitamin  15 mL Per Tube Daily  . pantoprazole sodium  40 mg Per Tube QHS  . potassium chloride  40 mEq Per Tube Once  . pravastatin  80 mg Per NG  tube QPM  . QUEtiapine  25 mg Per Tube QHS  . senna-docusate  2 tablet Oral BID   Infusions:  . ceFEPime (MAXIPIME) IV    . dexmedetomidine (PRECEDEX) IV infusion 0.8 mcg/kg/hr (11/22/18 0929)  . diltiazem (CARDIZEM) infusion Stopped (11/21/18 0201)  . feeding supplement (VITAL HIGH PROTEIN)    . HYDROmorphone Stopped (11/22/18 0745)  . midazolam Stopped (11/22/18 0745)   PRN: acetaminophen, bisacodyl, metoprolol tartrate, midazolam, ondansetron (ZOFRAN) IV, sennosides, sodium chloride flush, vecuronium Anti-infectives (From admission, onward)   Start     Dose/Rate Route Frequency Ordered Stop   11/22/18 1015  ceFEPIme (MAXIPIME) 2 g in sodium chloride 0.9 % 100 mL IVPB     2 g 200 mL/hr over 30 Minutes Intravenous Every 8 hours 11/22/18 1009        Assessment: Pharmacy consulted to initiate Heparin Drip on 58yo  patient admitted with acute exacerbation of CHF. Patient diagnosed with nonvalvular atrial fibrillation with acute onset of severe shortness of breath, weakness and fatigue, and hypoxia over the past week. Patient takes warfarin PTA with schedule being 76m SuTWThSa, and 2.529mMF which was restarted in-patient. INR level on 8/30 was 3.4. [name] Hgb is stable. Will transition from Warfarin to Heparin while in-patient.  Date INR Dose 8/28 3  8/29 3.3 4 mg 8/30 3.4 8/31 3.6 9/1 3.8 9/2 3.6 9/3 2.9  Goal of Therapy:  Heparin level 0.3-0.7 units/ml Monitor platelets by anticoagulation protocol: Yes   Plan:  INR 2.9, trending down. Previous increase in INR does not appear to have correlated with given dose. Question if warfarin is safest option for patient given fluctuating INR. Per discussion on rounds, prefer not to use warfarin for afib if possible. Will follow up alternative tomorrow morning as we will not dose until INR < 2. If patient to continue on warfarin, may likely need much lower dose. INR with morning labs.  AbTawnya CrookPharmD 11/22/2018,12:06 PM

## 2018-11-22 NOTE — Progress Notes (Signed)
Pt became very hypoxic (O2 sats 80%) along with having intermittent runs of V-tach. Pt given 10 mg Vecuronium and PEEP increased to 15 (peak pressures remain at 29).  Pt given 150 mg Amiodarone bolus and placed on Amiodarone infusion.  Serum Troponin, K, Magnesium, and EKG are pending.  CXR obtained which shows no changes from previous CXR (ETT in proper position, no PTX).  Discussed with Dr. Jimmy Footman, who recommends placing pt on left side and performing recruitment maneuvers on vent.  With repositioning pt's O2 sats have come up to 88 - 90%, and now in Atrial fibrillation with HR 110-120.  Continue to monitor.    Darel Hong, AGACNP-BC [facility] Pager: [phone] Cell: [phone]

## 2018-11-22 NOTE — Progress Notes (Signed)
Family At bedside, clinical status relayed to family  Updated and notified of patients medical condition-  Progressive multiorgan failure with very low chance of meaningful recovery.  [name] in dying  Process.  Family understands the situation.  They have consented and agreed to DNR.   Family are satisfied with Plan of action and management. All questions answered  Kyle White [name], M.D.  [facility]  Medical Director ICU-[facility] Medical Director [facility] Cardio-Pulmonary Department     

## 2018-11-22 NOTE — Procedures (Signed)
  PROCEDURE: BRONCHOSCOPY Therapeutic Aspiration of Tracheobronchial Tree  PROCEDURE DATE: 11/22/2018  TIME:  NAME:  Kyle White  DOB:10/07/1960  MRN: [id number] LOC:  IC05A/IC05A-AA    HOSP DAY: @LENGTHOFSTAYDAYS @ CODE STATUS:      Code Status Orders  (From admission, onward)         Start     Ordered   11/15/2018 1821  Full code  Continuous     10/22/2018 1821        Code Status History    This patient has a current code status but no historical code status.   Advance Care Planning Activity          Indications/Preliminary Diagnosis:   Consent: (Place X beside choice/s below)  The benefits, risks and possible complications of the procedure were        explained to:  ___ patient  ___ patient's family  ___ other:___________  who verbalized understanding and gave:  ___ verbal  ___ written  ___ verbal and written  ___ telephone  ___ other:________ consent.    x  Unable to obtain consent; procedure performed on emergent basis.     Other: EMERGENT with severe hypoxia      PRESEDATION ASSESSMENT: History and Physical has been performed. Patient meds and allergies have been reviewed. Presedation airway examination has been performed and documented. Baseline vital signs, sedation score, oxygenation status, and cardiac rhythm were reviewed. Patient was deemed to be in satisfactory condition to undergo the procedure.    PREMEDICATIONS:  Patient intubated and sedated on MV support      PROCEDURE DETAILS: Timeout performed and correct patient, name, & ID confirmed. Following prep per Pulmonary policy, appropriate sedation was administered. The Bronchoscope was inserted in to oral cavity with bite block in place. Therapeutic aspiration of Tracheobronchial tree was performed.  Airway exam proceeded with findings, technical procedures, and specimen collection as noted below. At the end of exam the scope was withdrawn without incident. Impression and Plan as noted below.      Insertion Route (Place X beside choice below)   Nasal   Oral  x Endotracheal Tube   Tracheostomy    SPECIMENS (Sites): (Place X beside choice below)  Specimens Description   No Specimens Obtained     [place]   x Lavage 5cc cluody mucus plugs   Biopsies    Fine Needle Aspirates    Brushings    Sputum    FINDINGS: extensive amount of mucus and purulent secretions ESTIMATED BLOOD LOSS: none COMPLICATIONS/RESOLUTION: none      IMPRESSION:POST-PROCEDURE DX:  Mucus plugs   RECOMMENDATION/PLAN:   Continue vent support Start ABX F/u cultures  Corrin Parker, M.D.  [facility]  Medical Director [place] Director [place] Department

## 2018-11-22 NOTE — Procedures (Signed)
Central Venous Catheter Placement:TRIPLE LUMEN Indication: Patient receiving vesicant or irritant drug.; Patient receiving intravenous therapy for longer than 5 days.; Patient has limited or no vascular access.   Consent:emergent    Hand washing performed prior to starting the procedure.   Procedure:   An active timeout was performed and correct patient, name, & ID confirmed.   Patient was positioned correctly for central venous access.  Patient was prepped using strict sterile technique including chlorohexadine preps, sterile drape, sterile gown and sterile gloves.    The area was prepped, draped and anesthetized in the usual sterile manner. Patient comfort was obtained.    A triple lumen catheter was placed in RT  Internal Jugular Vein There was good blood return, catheter caps were placed on lumens, catheter flushed easily, the line was secured and a sterile dressing and BIO-PATCH applied.   Ultrasound was used to visualize vasculature and guidance of needle.   Number of Attempts: 1 Complications:none Estimated Blood Loss: none Chest Radiograph indicated and ordered.  Operator: Avaeh Ewer.   Azrael Huss David Arminda Foglio, M.D.  [facility]  Medical Director ICU-[facility] [place] Medical Director [facility] Cardio-P[facility] Department     

## 2018-11-23 LAB — CBC WITH DIFFERENTIAL/PLATELET
Abs Immature Granulocytes: 0.06 10*3/uL (ref 0.00–0.07)
Basophils Absolute: 0 10*3/uL (ref 0.0–0.1)
Basophils Relative: 0 %
Eosinophils Absolute: 0 10*3/uL (ref 0.0–0.5)
Eosinophils Relative: 0 %
HCT: 44.9 % (ref 39.0–52.0)
Hemoglobin: 13.8 g/dL (ref 13.0–17.0)
Immature Granulocytes: 1 %
Lymphocytes Relative: 4 %
Lymphs Abs: 0.3 10*3/uL — ABNORMAL LOW (ref 0.7–4.0)
MCH: 29.9 pg (ref 26.0–34.0)
MCHC: 30.7 g/dL (ref 30.0–36.0)
MCV: 97.4 fL (ref 80.0–100.0)
Monocytes Absolute: 0.7 10*3/uL (ref 0.1–1.0)
Monocytes Relative: 9 %
Neutro Abs: 6.5 10*3/uL (ref 1.7–7.7)
Neutrophils Relative %: 86 %
Platelets: 140 10*3/uL — ABNORMAL LOW (ref 150–400)
RBC: 4.61 MIL/uL (ref 4.22–5.81)
RDW: 13.1 % (ref 11.5–15.5)
WBC: 7.5 10*3/uL (ref 4.0–10.5)
nRBC: 0 % (ref 0.0–0.2)

## 2018-11-23 LAB — BLOOD GAS, ARTERIAL
Acid-Base Excess: 8.3 mmol/L — ABNORMAL HIGH (ref 0.0–2.0)
Bicarbonate: 34 mmol/L — ABNORMAL HIGH (ref 20.0–28.0)
FIO2: 1
MECHVT: 500 mL
Mechanical Rate: 24
O2 Saturation: 93.5 %
PEEP: 15 cmH2O
Patient temperature: 37
pCO2 arterial: 50 mmHg — ABNORMAL HIGH (ref 32.0–48.0)
pH, Arterial: 7.44 (ref 7.350–7.450)
pO2, Arterial: 66 mmHg — ABNORMAL LOW (ref 83.0–108.0)

## 2018-11-23 LAB — GLUCOSE, CAPILLARY
Glucose-Capillary: 220 mg/dL — ABNORMAL HIGH (ref 70–99)
Glucose-Capillary: 249 mg/dL — ABNORMAL HIGH (ref 70–99)
Glucose-Capillary: 252 mg/dL — ABNORMAL HIGH (ref 70–99)
Glucose-Capillary: 255 mg/dL — ABNORMAL HIGH (ref 70–99)
Glucose-Capillary: 266 mg/dL — ABNORMAL HIGH (ref 70–99)
Glucose-Capillary: 305 mg/dL — ABNORMAL HIGH (ref 70–99)

## 2018-11-23 LAB — BASIC METABOLIC PANEL
Anion gap: 9 (ref 5–15)
Anion gap: 9 (ref 5–15)
BUN: 78 mg/dL — ABNORMAL HIGH (ref 6–20)
BUN: 74 mg/dL — ABNORMAL HIGH (ref 6–20)
CO2: 29 mmol/L (ref 22–32)
CO2: 30 mmol/L (ref 22–32)
Calcium: 8.5 mg/dL — ABNORMAL LOW (ref 8.9–10.3)
Calcium: 8.5 mg/dL — ABNORMAL LOW (ref 8.9–10.3)
Chloride: 106 mmol/L (ref 98–111)
Chloride: 105 mmol/L (ref 98–111)
Creatinine, Ser: 2.01 mg/dL — ABNORMAL HIGH (ref 0.61–1.24)
Creatinine, Ser: 1.89 mg/dL — ABNORMAL HIGH (ref 0.61–1.24)
GFR calc Af Amer: 41 mL/min — ABNORMAL LOW (ref >60)
GFR calc Af Amer: 44 mL/min — ABNORMAL LOW (ref >60)
GFR calc non Af Amer: 35 mL/min — ABNORMAL LOW (ref >60)
GFR calc non Af Amer: 38 mL/min — ABNORMAL LOW (ref >60)
Glucose, Bld: 280 mg/dL — ABNORMAL HIGH (ref 70–99)
Glucose, Bld: 311 mg/dL — ABNORMAL HIGH (ref 70–99)
Potassium: 5.4 mmol/L — ABNORMAL HIGH (ref 3.5–5.1)
Potassium: 5.4 mmol/L — ABNORMAL HIGH (ref 3.5–5.1)
Sodium: 144 mmol/L (ref 135–145)
Sodium: 144 mmol/L (ref 135–145)

## 2018-11-23 LAB — HEPARIN LEVEL (UNFRACTIONATED): Heparin Unfractionated: 1.17 IU/mL — ABNORMAL HIGH (ref 0.30–0.70)

## 2018-11-23 LAB — TROPONIN I (HIGH SENSITIVITY): Troponin I (High Sensitivity): 38 ng/L — ABNORMAL HIGH (ref <18)

## 2018-11-23 LAB — PROTIME-INR
INR: 1.8 — ABNORMAL HIGH (ref 0.8–1.2)
Prothrombin Time: 20.4 seconds — ABNORMAL HIGH (ref 11.4–15.2)

## 2018-11-23 LAB — APTT: aPTT: >160 seconds (ref 24–36)

## 2018-11-23 MED ORDER — HEPARIN (PORCINE) 25000 UT/250ML-% IV SOLN
1750.0000 [IU]/h | INTRAVENOUS | Status: DC
  Administered 2018-11-23: 1750 [IU]/h via INTRAVENOUS
  Filled 2018-11-23: qty 250

## 2018-11-23 MED ORDER — HEPARIN (PORCINE) 25000 UT/250ML-% IV SOLN
1000.0000 [IU]/h | INTRAVENOUS | Status: DC
  Administered 2018-11-24: 1400 [IU]/h via INTRAVENOUS
  Administered 2018-11-25 – 2018-11-26 (×2): 1000 [IU]/h via INTRAVENOUS
  Filled 2018-11-23 (×3): qty 250

## 2018-11-23 MED ORDER — SODIUM CHLORIDE 0.9% FLUSH
10.0000 mL | Freq: Two times a day (BID) | INTRAVENOUS | Status: DC
  Administered 2018-11-23: 10:00:00 20 mL
  Administered 2018-11-24 – 2018-11-27 (×6): 10 mL
  Administered 2018-11-29: 30 mL
  Administered 2018-11-29 – 2018-12-01 (×4): 10 mL
  Administered 2018-12-01: 30 mL
  Administered 2018-12-02 – 2018-12-04 (×4): 10 mL
  Administered 2018-12-05: 23:00:00 30 mL
  Administered 2018-12-06 (×2): 10 mL
  Administered 2018-12-07: 40 mL
  Administered 2018-12-07 – 2018-12-12 (×8): 10 mL

## 2018-11-23 MED ORDER — HEPARIN BOLUS VIA INFUSION
5000.0000 [IU] | Freq: Once | INTRAVENOUS | Status: AC
  Administered 2018-11-23: 13:00:00 5000 [IU] via INTRAVENOUS
  Filled 2018-11-23: qty 5000

## 2018-11-23 MED ORDER — FUROSEMIDE 10 MG/ML IJ SOLN
80.0000 mg | Freq: Once | INTRAMUSCULAR | Status: AC
  Administered 2018-11-23: 13:00:00 80 mg via INTRAVENOUS
  Filled 2018-11-23: qty 8

## 2018-11-23 MED ORDER — SODIUM CHLORIDE 0.9% FLUSH: 10.0000 mL | INTRAVENOUS | Status: DC | PRN

## 2018-11-23 MED ORDER — CHLORHEXIDINE GLUCONATE CLOTH 2 % EX PADS
6.0000 | MEDICATED_PAD | Freq: Every day | CUTANEOUS | Status: DC
  Administered 2018-11-23 – 2018-12-12 (×19): 6 via TOPICAL

## 2018-11-23 MED ORDER — SODIUM POLYSTYRENE SULFONATE 15 GM/60ML PO SUSP
30.0000 g | Freq: Once | ORAL | Status: AC
  Administered 2018-11-23: 06:00:00 30 g
  Filled 2018-11-23: qty 120

## 2018-11-23 NOTE — Consult Note (Addendum)
ANTICOAGULATION CONSULT NOTE  Pharmacy Consult for Heparin Drip Indication: atrial fibrillation  No Known Allergies  Patient Measurements: Height: 5' 9.02" (175.3 cm) Weight: (!) 457 lb (207.3 kg) IBW/kg (Calculated) : 70.74 Heparin Dosing Weight: 123 kg  Vital Signs: Temp: 102.3 F (39.1 C) (09/04 1300) Temp Source: Oral (09/04 1300) BP: 105/66 (09/04 1300) Pulse Rate: 47 (09/04 1300)  Labs: Recent Labs    11/21/18 0346 11/22/18 0429 11/22/18 2205 11/23/18 0011 11/23/18 0447  HGB 14.1 14.9  --   --  13.8  HCT 43.1 46.4  --   --  44.9  PLT 164 156  --   --  140*  LABPROT 35.5* 29.5*  --   --  20.4*  INR 3.6* 2.9*  --   --  1.8*  CREATININE 1.58* 1.51*  --   --  2.01*  TROPONINIHS  --   --  34* 38*  --     Estimated Creatinine Clearance: 70.1 mL/min (A) (by C-G formula based on SCr of 2.01 mg/dL (H)).   Medical History: Past Medical History:  Diagnosis Date  . Atrial fibrillation ([place])   . CHF (congestive heart failure) ([place])   . Diabetes mellitus without complication ([place])   . Gout   . Hypertension associated with type 2 diabetes mellitus ([place])   . Morbid obesity ([place])   . Obstructive sleep apnea     Medications:  Medications Prior to Admission  Medication Sig Dispense Refill Last Dose  . aspirin EC 81 MG tablet Take 81 mg by mouth daily.   11/15/2018 at Unknown time  . colchicine 0.6 MG tablet Take 12 mg by mouth. At first sing of gout flare   prn at prn  . diltiazem (CARDIZEM CD) 240 MG 24 hr capsule Take 240 mg by mouth daily.   11/15/2018 at Unknown time  . furosemide (LASIX) 40 MG tablet Take 40 mg by mouth 2 (two) times daily.   11/15/2018 at Unknown time  . lisinopril (ZESTRIL) 5 MG tablet Take 5 mg by mouth daily.   11/15/2018 at Unknown time  . metFORMIN (GLUCOPHAGE) 500 MG tablet Take 500 mg by mouth 2 (two) times daily with a meal.   11/15/2018 at Unknown time  . metoprolol tartrate (LOPRESSOR) 100 MG tablet Take 50 mg by mouth 2 (two) times daily.    11/15/2018 at Unknown time  . pravastatin (PRAVACHOL) 80 MG tablet Take 80 mg by mouth every evening.   11/15/2018 at Unknown time  . warfarin (COUMADIN) 4 MG tablet Take 2.5-4 mg by mouth daily. Take 4 mg on Sunday, Tuesday, Wednesday, Thursday, and Saturday. Take 2.5 mg on Monday and Friday   11/15/2018 at Unknown time   Scheduled:  . allopurinol  100 mg Per Tube Daily  . ALPRAZolam  0.5 mg Per Tube BID  . aspirin  81 mg Per NG tube Daily  . chlorhexidine gluconate (MEDLINE KIT)  15 mL Mouth Rinse BID  . Chlorhexidine Gluconate Cloth  6 each Topical Daily  . feeding supplement (PRO-STAT SUGAR FREE 64)  60 mL Per Tube TID  . insulin aspart  0-15 Units Subcutaneous Q4H  . insulin aspart  4 Units Subcutaneous Q4H  . insulin detemir  10 Units Subcutaneous Daily  . ipratropium-albuterol  3 mL Nebulization Q4H  . lisinopril  5 mg Per NG tube Daily  . mouth rinse  15 mL Mouth Rinse 10 times per day  . methylPREDNISolone (SOLU-MEDROL) injection  40 mg Intravenous BID  . metoprolol  tartrate  50 mg Per NG tube BID  . multivitamin  15 mL Per Tube Daily  . pantoprazole sodium  40 mg Per Tube QHS  . potassium chloride  40 mEq Per Tube Once  . pravastatin  80 mg Per NG tube QPM  . QUEtiapine  25 mg Per Tube QHS  . senna-docusate  2 tablet Oral BID  . sodium chloride flush  10-40 mL Intracatheter Q12H   Infusions:  . amiodarone 30 mg/hr (11/23/18 0500)  . ceFEPime (MAXIPIME) IV 2 g (11/23/18 0553)  . dexmedetomidine (PRECEDEX) IV infusion 0.4 mcg/kg/hr (11/23/18 1241)  . diltiazem (CARDIZEM) infusion Stopped (11/21/18 0201)  . feeding supplement (VITAL HIGH PROTEIN) 1,000 mL (11/22/18 1642)  . heparin 1,750 Units/hr (11/23/18 1303)  . HYDROmorphone 2 mg/hr (11/23/18 0834)  . midazolam 5 mg/hr (11/23/18 1240)  . norepinephrine (LEVOPHED) Adult infusion Stopped (11/22/18 2030)  . phenylephrine (NEO-SYNEPHRINE) Adult infusion Stopped (11/23/18 0241)   PRN: acetaminophen, bisacodyl, metoprolol  tartrate, midazolam, ondansetron (ZOFRAN) IV, sennosides, sodium chloride flush, sodium chloride flush, vecuronium Anti-infectives (From admission, onward)   Start     Dose/Rate Route Frequency Ordered Stop   11/22/18 1015  ceFEPIme (MAXIPIME) 2 g in sodium chloride 0.9 % 100 mL IVPB     2 g 200 mL/hr over 30 Minutes Intravenous Every 8 hours 11/22/18 1009        Assessment: Pharmacy consulted to initiate Heparin Drip on 58yo patient admitted with acute exacerbation of CHF. Patient diagnosed with nonvalvular atrial fibrillation with acute onset of severe shortness of breath, weakness and fatigue, and hypoxia over the past week. Patient takes warfarin PTA with schedule being 44m SuTWThSa, and 2.544mMF which was restarted in-patient. INR level on 8/30 was 3.4. [name] Hgb is stable. Will transition from Warfarin to Heparin while in-patient.  Date INR Dose 8/28 3  8/29 3.3 4 mg 8/30 3.4 8/31 3.6 9/1 3.8 9/2 3.6 9/3 2.9 9/4 1.8  Goal of Therapy:  Heparin level 0.3-0.7 units/ml Monitor platelets by anticoagulation protocol: Yes   Plan:  INR now less than 2. Per discussion on rounds, will start heparin drip. Concern with warfarin being a safe option for patient with rather significant fluctuation in INR. Patient remains critically ill. Renal function has declined.   Will give heparin 5000 unit bolus followed by heparin infusion at 1750 units/hr. APTT > 160, looks like it was drawn shortly after bolus was given. Heparin level at 1900. CBC with morning labs.  AbTawnya CrookPharmD 11/23/2018,1:43 PM

## 2018-11-23 NOTE — Progress Notes (Signed)
Patient received a bath and linen change. While rolling the patient, serous blisters were popped. O2 sats dropped to 70s despite being on 100% FIO2. RT called to bedside. O2 sats gradually increased to 80s.

## 2018-11-23 NOTE — Consult Note (Signed)
ANTICOAGULATION CONSULT NOTE  Pharmacy Consult for Heparin Drip Indication: atrial fibrillation  No Known Allergies  Patient Measurements: Height: 5' 9.02" (175.3 cm) Weight: (!) 457 lb (207.3 kg) IBW/kg (Calculated) : 70.74 Heparin Dosing Weight: 123 kg  Vital Signs: Temp: 99.7 F (37.6 C) (09/04 1600) Temp Source: Oral (09/04 1600) BP: 136/62 (09/04 1820) Pulse Rate: 50 (09/04 1900)  Labs: Recent Labs    11/21/18 0346 11/22/18 0429 11/22/18 2205 11/23/18 0011 11/23/18 0447 11/23/18 1320 11/23/18 1822  HGB 14.1 14.9  --   --  13.8  --   --   HCT 43.1 46.4  --   --  44.9  --   --   PLT 164 156  --   --  140*  --   --   APTT  --   --   --   --   --  >160*  --   LABPROT 35.5* 29.5*  --   --  20.4*  --   --   INR 3.6* 2.9*  --   --  1.8*  --   --   HEPARINUNFRC  --   --   --   --   --   --  1.17*  CREATININE 1.58* 1.51*  --   --  2.01* 1.89*  --   TROPONINIHS  --   --  34* 38*  --   --   --     Estimated Creatinine Clearance: 74.6 mL/min (A) (by C-G formula based on SCr of 1.89 mg/dL (H)).   Medical History: Past Medical History:  Diagnosis Date  . Atrial fibrillation ([place])   . CHF (congestive heart failure) ([place])   . Diabetes mellitus without complication ([place])   . Gout   . Hypertension associated with type 2 diabetes mellitus ([place])   . Morbid obesity ([place])   . Obstructive sleep apnea     Medications:  Medications Prior to Admission  Medication Sig Dispense Refill Last Dose  . aspirin EC 81 MG tablet Take 81 mg by mouth daily.   11/15/2018 at Unknown time  . colchicine 0.6 MG tablet Take 12 mg by mouth. At first sing of gout flare   prn at prn  . diltiazem (CARDIZEM CD) 240 MG 24 hr capsule Take 240 mg by mouth daily.   11/15/2018 at Unknown time  . furosemide (LASIX) 40 MG tablet Take 40 mg by mouth 2 (two) times daily.   11/15/2018 at Unknown time  . lisinopril (ZESTRIL) 5 MG tablet Take 5 mg by mouth daily.   11/15/2018 at Unknown time  . metFORMIN  (GLUCOPHAGE) 500 MG tablet Take 500 mg by mouth 2 (two) times daily with a meal.   11/15/2018 at Unknown time  . metoprolol tartrate (LOPRESSOR) 100 MG tablet Take 50 mg by mouth 2 (two) times daily.   11/15/2018 at Unknown time  . pravastatin (PRAVACHOL) 80 MG tablet Take 80 mg by mouth every evening.   11/15/2018 at Unknown time  . warfarin (COUMADIN) 4 MG tablet Take 2.5-4 mg by mouth daily. Take 4 mg on Sunday, Tuesday, Wednesday, Thursday, and Saturday. Take 2.5 mg on Monday and Friday   11/15/2018 at Unknown time   Scheduled:  . allopurinol  100 mg Per Tube Daily  . ALPRAZolam  0.5 mg Per Tube BID  . aspirin  81 mg Per NG tube Daily  . chlorhexidine gluconate (MEDLINE KIT)  15 mL Mouth Rinse BID  . Chlorhexidine Gluconate Cloth  6 each Topical  Daily  . feeding supplement (PRO-STAT SUGAR FREE 64)  60 mL Per Tube TID  . insulin aspart  0-15 Units Subcutaneous Q4H  . insulin aspart  4 Units Subcutaneous Q4H  . insulin detemir  10 Units Subcutaneous Daily  . ipratropium-albuterol  3 mL Nebulization Q4H  . mouth rinse  15 mL Mouth Rinse 10 times per day  . methylPREDNISolone (SOLU-MEDROL) injection  40 mg Intravenous BID  . metoprolol tartrate  50 mg Per NG tube BID  . multivitamin  15 mL Per Tube Daily  . pantoprazole sodium  40 mg Per Tube QHS  . potassium chloride  40 mEq Per Tube Once  . pravastatin  80 mg Per NG tube QPM  . QUEtiapine  25 mg Per Tube QHS  . senna-docusate  2 tablet Oral BID  . sodium chloride flush  10-40 mL Intracatheter Q12H   Infusions:  . amiodarone 30 mg/hr (11/23/18 1800)  . ceFEPime (MAXIPIME) IV Stopped (11/23/18 1451)  . dexmedetomidine (PRECEDEX) IV infusion 0.4 mcg/kg/hr (11/23/18 1927)  . diltiazem (CARDIZEM) infusion Stopped (11/21/18 0201)  . feeding supplement (VITAL HIGH PROTEIN) 1,000 mL (11/23/18 1624)  . HYDROmorphone 2 mg/hr (11/23/18 1800)  . midazolam 4 mg/hr (11/23/18 1800)  . norepinephrine (LEVOPHED) Adult infusion 1 mcg/min (11/23/18  1800)  . phenylephrine (NEO-SYNEPHRINE) Adult infusion Stopped (11/23/18 0241)   PRN: acetaminophen, bisacodyl, metoprolol tartrate, midazolam, ondansetron (ZOFRAN) IV, sennosides, sodium chloride flush, sodium chloride flush, vecuronium Anti-infectives (From admission, onward)   Start     Dose/Rate Route Frequency Ordered Stop   11/22/18 1015  ceFEPIme (MAXIPIME) 2 g in sodium chloride 0.9 % 100 mL IVPB     2 g 200 mL/hr over 30 Minutes Intravenous Every 8 hours 11/22/18 1009        Assessment: Pharmacy consulted to initiate Heparin Drip on 57yo patient admitted with acute exacerbation of CHF. Patient diagnosed with nonvalvular atrial fibrillation with acute onset of severe shortness of breath, weakness and fatigue, and hypoxia over the past week. Patient takes warfarin PTA with schedule being 78m SuTWThSa, and 2.567mMF which was restarted in-patient. INR level on 8/30 was 3.4. [name] Hgb is stable. Will transition from Warfarin to Heparin while in-patient.  Date INR Dose 8/28 3  8/29 3.3 4 mg 8/30 3.4 8/31 3.6 9/1 3.8 9/2 3.6 9/3 2.9 9/4 1.8  Heparin 9/4 Heparin infusion started @ 1750 units/hr  Goal of Therapy:  Heparin level 0.3-0.7 units/ml Monitor platelets by anticoagulation protocol: Yes   Plan:  INR now less than 2. Per discussion on rounds, will start heparin drip. Concern with warfarin being a safe option for patient with rather significant fluctuation in INR. Patient remains critically ill. Renal function has declined.   9/4 @ 1822 HL: 1.17. Level is supratherapeutic. Will hold heparin infusion 1 hour and restart @ reduced infusion rate of 1400 units/hr. Recheck heparin level 6 hours after infusion rate change.  CBC with morning labs.  ShPernell DuprePharmD, BCPS Clinical Pharmacist 11/23/2018 8:04 PM

## 2018-11-23 NOTE — Progress Notes (Signed)
[facility] [place] NAME: Kyle White    MR#:  [id number]  DATE OF BIRTH:  Mar 07, 1961  SUBJECTIVE:  patient remains intubated on the ventilator on 100% Fio2 underwent bronchoscopy 11/22/18 Previously intubated first time got extubated and reintubated.  Clinically deteriorating  REVIEW OF SYSTEMS:   Review of Systems  Unable to perform ROS: Intubated   DRUG ALLERGIES:  No Known Allergies  VITALS:  Blood pressure (!) 87/57, pulse 99, temperature (!) 101.8 F (38.8 C), temperature source Oral, resp. rate (!) 23, height 5' 9.02" (1.753 m), weight (!) 207.3 kg, SpO2 91 %.  PHYSICAL EXAMINATION:   Physical Exam  GENERAL:  58 y.o.-year-old patient lying in the bed with no acute distress. Morbidly obese critically ill intubated on the vent EYES: Pupils equal, round, reactive to light and accommodation. No scleral icterus. Extraocular muscles intact.  HEENT: Head atraumatic, normocephalic. Oropharynx and nasopharynx clear.  NECK:  Supple, no jugular venous distention. No thyroid enlargement, no tenderness.  LUNGS: Normal breath sounds bilaterally, no wheezing, rales, rhonchi. No use of accessory muscles of respiration.  CARDIOVASCULAR: S1, S2 normal. No murmurs, rubs, or gallops.  ABDOMEN: Soft, nontender, nondistended. Bowel sounds present. No organomegaly or mass.  EXTREMITIES: No cyanosis, clubbing or edema b/l.    NEUROLOGIC: sedated  PSYCHIATRIC: sedated  And on the vent SKIN: No obvious rash, lesion, or ulcer.   LABORATORY PANEL:  CBC Recent Labs  Lab 11/23/18 0447  WBC 7.5  HGB 13.8  HCT 44.9  PLT 140*    Chemistries  Recent Labs  Lab 11/17/18 0029  11/22/18 2205  11/23/18 1320  NA 136   < >  --    < > 144  K 4.8   < > 5.5*   < > 5.4*  CL 94*   < >  --    < > 105  CO2 31   < >  --    < > 30  GLUCOSE 172*   < >  --    < > 311*  BUN 34*   < >  --    < > 74*  CREATININE 1.28*   < >  --    < > 1.89*  CALCIUM  8.7*   < >  --    < > 8.5*  MG 2.6*   < > 3.3*  --   --   AST 20  --   --   --   --   ALT 25  --   --   --   --   ALKPHOS 64  --   --   --   --   BILITOT 1.0  --   --   --   --    < > = values in this interval not displayed.   Cardiac Enzymes No results for input(s): TROPONINI in the last 168 hours. RADIOLOGY:  Dg Chest Port 1 View  Result Date: 11/22/2018 CLINICAL DATA:  Ventilator support.  Follow-up. EXAM: PORTABLE CHEST 1 VIEW COMPARISON:  Earlier same day FINDINGS: Endotracheal tube tip is 3 cm above the carina. Orogastric or nasogastric tube enters the abdomen. Right internal jugular central line tip in the SVC above the right atrium. Persistent bilateral lower lung atelectasis and or pneumonia. No worsening or new finding. IMPRESSION: No apparent change since earlier today. Lines and tubes well position. Persistent atelectasis and or pneumonia in the mid and lower lungs. Electronically Signed  By: Nelson Chimes M.D.   On: 11/22/2018 21:23   Dg Chest Port 1 View  Result Date: 11/22/2018 CLINICAL DATA:  Central line placement EXAM: PORTABLE CHEST 1 VIEW COMPARISON:  11/22/2018 FINDINGS: [name] rotated. Interval placement of a right IJ central venous catheter with distal tip terminating at the expected level of the superior cavoatrial junction. ET tube remains in place with distal tip terminating 3.8 cm superior to the carina. Enteric tube courses below the diaphragm with distal tip beyond the inferior margin of the film. Multiple overlying external leads. Cardiac silhouette is largely obscured but appears enlarged. Bilateral pleural effusions. Volume loss within the right lung. Slight improved aeration of the right lung compared to prior. No pneumothorax visualized. IMPRESSION: 1. Interval placement of right IJ central venous catheter. No pneumothorax. 2. Improving aeration of the right lung. Electronically Signed   By: Davina Poke M.D.   On: 11/22/2018 14:26   Dg Chest Port 1  View  Result Date: 11/22/2018 CLINICAL DATA:  Multiple tracheobronchial mucus plugs, history CHF, diabetes mellitus, atrial fibrillation, hypertension EXAM: PORTABLE CHEST 1 VIEW COMPARISON:  Portable exam 0830 hours compared to 0035 hours FINDINGS: Tip of endotracheal tube projects 4.1 cm above carina. Nasogastric tube extends into abdomen. Volume loss in the RIGHT hemithorax with slight mediastinal shift to the RIGHT, likely reflecting a combination of atelectasis and question effusion. LEFT pleural effusion and basilar atelectasis are present. No pneumothorax. Bones demineralized. IMPRESSION: Atelectasis and probable pleural effusion opacified RIGHT hemithorax with volume loss. Persistent LEFT pleural effusion and basilar atelectasis. Electronically Signed   By: Lavonia Dana M.D.   On: 11/22/2018 08:44   Dg Chest Port 1 View  Result Date: 11/22/2018 CLINICAL DATA:  Hypoxia. EXAM: PORTABLE CHEST 1 VIEW COMPARISON:  Radiograph yesterday at 0550 hour FINDINGS: Endotracheal tube tip 3.6 cm from the carina. Enteric tube in place tip not well visualized. Complete opacification of the right hemithorax which is new from prior exam. Heart size and mediastinal contours are obscured. Left pleural effusion with volume loss in the left lung. Congestive changes on the left. IMPRESSION: 1. Complete opacification of the right hemithorax which is new from prior exam. Question underlying mucous plugging. 2. Overall low lung volumes. 3. Support apparatus unchanged. Electronically Signed   By: Keith Rake M.D.   On: 11/22/2018 01:11   ASSESSMENT AND PLAN:  Kyle White  is a 58 y.o. male with a known history of atrial fibrillation on Coumadin, CHF, diabetes mellitus, and obstructive sleep apnea with CPAP use at home, morbid obesity with sedentary lifestyle on oxygen at 2 L/min at home.  1.Acute on chronic  respiratory failure with severe hypoxia-ARDS with pneumonia - Extubated on 8/31 but required reintubation,  vent management per PCCM team.  Unsuccessful weaning trials -patient underwent bronchoscopy 11/22/2018. 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 -full went support with hundred percent of FI02--- very poor prognosis with high mortality and morbidity -Lasix trial today with close monitoring of renal function as renal function is getting worse  2.Obstructive sleep apnea-- chronic  3. Acute on chronic diastolic CHF, EF 55 to [id number] on echo in 2014 at [facility] -Repeat echocardiogram shows EF of 55 to 60%  4. History of atrial fibrillation -Continue Coumadin - INR 3.8  5. Acute renal failure with creatinine 1.28-1.89 -Repeat BMP in the a.m. and continue to monitor renal function closely  6.  Type IIDiabetes mellitus -Sliding scale insulin -Hemoglobin A1c 7.3  7. Generalized weakness -Physical therapy  eval once extubated  8.  Abdominal distention -No acute pathology on CT scan or KUB performed  -Could be ileus, NG tube in place   Overall poor prognosis. Appreciate palliative care input CODE STATUS: DNR  DVT Prophylaxis: Lovenox  TOTAL TIME TAKING CARE OF THIS PATIENT:33 minutes.  >50% time spent on counselling and coordination of care  POSSIBLE D/C IN *?* DAYS, DEPENDING ON CLINICAL CONDITION.  Note: This dictation was prepared with Dragon dictation along with smaller phrase technology. Any transcriptional errors that result from this process are unintentional. Plan of care discussed with the patient's girlfriend at bedside  Nicholes Mango M.D on 11/23/2018 at 3:41 PM  Between 7am to 6pm - Pager - [phone]  After 6pm go to [email or web address] - password [email or web address] [facility]  Office  [phone]  CC: Primary care physician; Inc, [place] ServicesPatient ID: Kyle White, male   DOB: 1960-11-12, 58 y.o.   MRN: [id number]

## 2018-11-23 NOTE — Progress Notes (Signed)
Pharmacy Electrolyte Monitoring Consult:  Pharmacy consulted to assist in monitoring and replacing electrolytes in this 58 y.o. male admitted on 10/26/2018. Patient admitted to ICU on 8/29 and intubated. Patient with past medical history significant for atrial fibrillation, CHF, diabetes, gout, hypertension, obesity, and sleep apnea.   Labs:  Sodium (mmol/L)  Date Value  11/23/2018 144   Potassium (mmol/L)  Date Value  11/23/2018 5.4 (H)   Magnesium (mg/dL)  Date Value  11/22/2018 3.3 (H)   Phosphorus (mg/dL)  Date Value  11/22/2018 5.3 (H)   Calcium (mg/dL)  Date Value  11/23/2018 8.5 (L)   Albumin (g/dL)  Date Value  11/17/2018 3.5    Assessment/Plan: Electrolytes elevated this am although patient has not received supplementation for some time. Expect s/t decreased renal function. Patient given treatment for hyperkalemia. Potassium has remained the same.  Will replace for goal potassium ~ 4 and goal magnesium ~ 2.  Will obtain BMP with am labs and magnesium/phosphorus as warranted.   Pharmacy will continue to monitor and adjust per consult.   Tawnya Crook, PharmD 11/23/2018 2:53 PM

## 2018-11-23 NOTE — Progress Notes (Signed)
CRITICAL VALUE ALERT  Critical Value:  APTT >160  Date & Time Notied:  11/23/2018 1415  Provider Notified: Dorena Bodo Pharmacist  Orders Received/Actions taken: Pharmacist thinks elevated level is due to proximity to heparin bolus. Team will continue to monitor. Will continue heparin infusion for now.

## 2018-11-23 NOTE — Progress Notes (Signed)
Inpatient Diabetes Program Recommendations  AACE/ADA: New Consensus Statement on Inpatient Glycemic Control (2015)  Target Ranges:  Prepandial:   less than 140 mg/dL      Peak postprandial:   less than 180 mg/dL (1-2 hours)      Critically ill patients:  140 - 180 mg/dL   Lab Results  Component Value Date   GLUCAP 255 (H) 11/23/2018   HGBA1C 7.3 (H) 11/17/2018    Review of Glycemic Control Results for OMARRI, NGAN (MRN [id number]) as of 11/23/2018 13:49  Ref. Range 11/23/2018 04:27 11/23/2018 07:31 11/23/2018 12:45  Glucose-Capillary Latest Ref Range: 70 - 99 mg/dL 252 (H) 266 (H) 255 (H)  Diabetes history: DM 2 Outpatient Diabetes medications: Metformin 1000 mg bid Current orders for Inpatient glycemic control:  Novolog Moderate 0-15 units Q4 hours Levemir 10 units daily, Novolog 4 units q 4 hours  Inpatient Diabetes Program Recommendations:   Consider increasing Levemir to 10 units bid.  Also consider increasing Novolog tube feed coverage to 6 units q 4 hours.   Thanks  Adah Perl, RN, BC-ADM Inpatient Diabetes Coordinator Pager [phone] (8a-5p)

## 2018-11-23 NOTE — Progress Notes (Signed)
CRITICAL CARE NOTE 58 yo w/hx of morbid obesity, HFpEF hx MVR, CHF, PAF, OSA, DM, AHRF came in with acute hypoxemic hypercarbic resp failure with CXR showing b/l pl effusions and pulm edema. CT with restrictive physiology, pulm edema, bilateral pleural effusions and compressive atelectasiss/p extubation on bIPAP Re-intubated 8/31  CC  follow up respiratory failure  SUBJECTIVE Patient remains critically ill Prognosis is guarded Severe hypoxia   BP 107/90   Pulse 97   Temp 99.8 F (37.7 C) (Oral)   Resp (!) 24   Ht 5' 9.02" (1.753 m)   Wt (!) 207.3 kg   SpO2 91%   BMI 67.46 kg/m    I/O last 3 completed shifts: In: 3219.2 [I.V.:1817.7; NG/GT:1201.5; IV Piggyback:200] Out: 2800 [Urine:2800] No intake/output data recorded.  SpO2: 91 % O2 Flow Rate (L/min): 3 L/min FiO2 (%): 100 %   SIGNIFICANT EVENTS 8/29 admitted for severe resp failure CHF exacerbation 8/30 extubated 8/31 re-intubated, PICC line placed 9/2 severe hypoxia 9/3 RT hemithorax opacification plan for bronch, CVl placed, severe hypoxia   REVIEW OF SYSTEMS  [name] UNABLE TO PROVIDE COMPLETE REVIEW OF SYSTEMS DUE TO SEVERE CRITICAL ILLNESS   PHYSICAL EXAMINATION:  GENERAL:critically ill appearing, +resp distress HEAD: Normocephalic, atraumatic.  EYES: Pupils equal, round, reactive to light.  No scleral icterus.  MOUTH: Moist mucosal membrane. NECK: Supple.  PULMONARY: +rhonchi, +wheezing CARDIOVASCULAR: S1 and S2. Regular rate and rhythm. No murmurs, rubs, or gallops.  GASTROINTESTINAL: Soft, nontender, -distended. No masses. Positive bowel sounds. No hepatosplenomegaly.  MUSCULOSKELETAL: No swelling, clubbing, or edema.  NEUROLOGIC: obtunded, GCS<8 SKIN:intact,warm,dry  MEDICATIONS: I have reviewed all medications and confirmed regimen as documented   CULTURE RESULTS   Recent Results (from the past 240 hour(s))  SARS CORONAVIRUS 2 (TAT 6-12 HRS) Nasal Swab Aptima Multi Swab     Status:  None   Collection Time: 11/05/2018  4:55 PM   Specimen: Aptima Multi Swab; Nasal Swab  Result Value Ref Range Status   SARS Coronavirus 2 NEGATIVE NEGATIVE Final    Comment: (NOTE) SARS-CoV-2 target nucleic acids are NOT DETECTED. The SARS-CoV-2 RNA is generally detectable in upper and lower respiratory specimens during the acute phase of infection. Negative results do not preclude SARS-CoV-2 infection, do not rule out co-infections with other pathogens, and should not be used as the sole basis for treatment or other patient management decisions. Negative results must be combined with clinical observations, patient history, and epidemiological information. The expected result is Negative. Fact Sheet for Patients: SugarRoll.be Fact Sheet for Healthcare Providers: https://www.woods-mathews.com/ This test is not yet approved or cleared by the [place] FDA and  has been authorized for detection and/or diagnosis of SARS-CoV-2 by FDA under an Emergency Use Authorization (EUA). This EUA will remain  in effect (meaning this test can be used) for the duration of the COVID-19 declaration under Section 56 4(b)(1) of the Act, 21 U.S.C. section 360bbb-3(b)(1), unless the authorization is terminated or revoked sooner. Performed at [facility], [place] [address], [place], [place] [address]   SARS Coronavirus 2 [facility] order, Performed in [facility] hospital lab) Nasopharyngeal Nasopharyngeal Swab     Status: None   Collection Time: 11/15/2018 10:34 PM   Specimen: Nasopharyngeal Swab  Result Value Ref Range Status   SARS Coronavirus 2 NEGATIVE NEGATIVE Final    Comment: (NOTE) If result is NEGATIVE SARS-CoV-2 target nucleic acids are NOT DETECTED. The SARS-CoV-2 RNA is generally detectable in upper and lower  respiratory specimens during the  acute phase of infection. The lowest  concentration of SARS-CoV-2 viral copies this assay can detect is  250  copies / mL. A negative result does not preclude SARS-CoV-2 infection  and should not be used as the sole basis for treatment or other  patient management decisions.  A negative result may occur with  improper specimen collection / handling, submission of specimen other  than nasopharyngeal swab, presence of viral mutation(s) within the  areas targeted by this assay, and inadequate number of viral copies  (<250 copies / mL). A negative result must be combined with clinical  observations, patient history, and epidemiological information. If result is POSITIVE SARS-CoV-2 target nucleic acids are DETECTED. The SARS-CoV-2 RNA is generally detectable in upper and lower  respiratory specimens dur ing the acute phase of infection.  Positive  results are indicative of active infection with SARS-CoV-2.  Clinical  correlation with patient history and other diagnostic information is  necessary to determine patient infection status.  Positive results do  not rule out bacterial infection or co-infection with other viruses. If result is PRESUMPTIVE POSTIVE SARS-CoV-2 nucleic acids MAY BE PRESENT.   A presumptive positive result was obtained on the submitted specimen  and confirmed on repeat testing.  While 2019 novel coronavirus  (SARS-CoV-2) nucleic acids may be present in the submitted sample  additional confirmatory testing may be necessary for epidemiological  and / or clinical management purposes  to differentiate between  SARS-CoV-2 and other Sarbecovirus currently known to infect humans.  If clinically indicated additional testing with an alternate test  methodology [phone]) is advised. The SARS-CoV-2 RNA is generally  detectable in upper and lower respiratory sp ecimens during the acute  phase of infection. The expected result is Negative. Fact Sheet for Patients:  StrictlyIdeas.no Fact Sheet for Healthcare  Providers: BankingDealers.co.za This test is not yet approved or cleared by the [place] FDA and has been authorized for detection and/or diagnosis of SARS-CoV-2 by FDA under an Emergency Use Authorization (EUA).  This EUA will remain in effect (meaning this test can be used) for the duration of the COVID-19 declaration under Section 564(b)(1) of the Act, 21 U.S.C. section 360bbb-3(b)(1), unless the authorization is terminated or revoked sooner. Performed at [facility], [address] [place], [place] [address]   Culture, respiratory (non-expectorated)     Status: None   Collection Time: 10/24/2018 10:34 PM   Specimen: Tracheal Aspirate; Respiratory  Result Value Ref Range Status   Specimen Description   Final    TRACHEAL ASPIRATE Performed at [facility], [address] [place], [place] [address]    Special Requests   Final    NONE Performed at [facility], [address] [place], [place] [address]    Gram Stain   Final    RARE WBC PRESENT, PREDOMINANTLY PMN ABUNDANT GRAM POSITIVE COCCI IN CHAINS    Culture   Final    Consistent with normal respiratory flora. Performed at [facility], [place] [address] [place], [place] [address]    Report Status 11/19/2018 FINAL  Final  Urine Culture     Status: None   Collection Time: 11/04/2018 11:12 PM   Specimen: Urine, Random  Result Value Ref Range Status   Specimen Description   Final    URINE, RANDOM Performed at [facility], [address] [place], [place] [address]    Special Requests   Final    NONE Performed at [facility], [place]  [address], [place], [place] [address]    Culture   Final    NO GROWTH Performed at [facility], [place] [address] [place], [place] [address]    Report Status 11/18/2018 FINAL  Final  MRSA PCR Screening     Status: None   Collection Time: 10/30/2018 11:28 PM   Specimen: Nasopharyngeal  Result Value Ref  Range Status   MRSA by PCR NEGATIVE NEGATIVE Final    Comment:        The GeneXpert MRSA Assay (FDA approved for NASAL specimens only), is one component of a comprehensive MRSA colonization surveillance program. It is not intended to diagnose MRSA infection nor to guide or monitor treatment for MRSA infections. Performed at [facility], [address] [place], [place] [address]   Culture, blood (Routine X 2) w Reflex to ID Panel     Status: None   Collection Time: 11/17/18 12:19 AM   Specimen: BLOOD  Result Value Ref Range Status   Specimen Description BLOOD RIGHT ANTECUBITAL  Final   Special Requests   Final    BOTTLES DRAWN AEROBIC AND ANAEROBIC Blood Culture adequate volume   Culture   Final    NO GROWTH 5 DAYS Performed at [facility], [address] [place], [place] [address]    Report Status 11/22/2018 FINAL  Final  Culture, blood (Routine X 2) w Reflex to ID Panel     Status: None   Collection Time: 11/17/18 12:31 AM   Specimen: BLOOD  Result Value Ref Range Status   Specimen Description BLOOD CENTRAL LINE MIDLINE  Final   Special Requests   Final    BOTTLES DRAWN AEROBIC AND ANAEROBIC Blood Culture adequate volume   Culture   Final    NO GROWTH 5 DAYS Performed at [facility], [address] [place], [place] [address]    Report Status 11/22/2018 FINAL  Final  Culture, bal-quantitative     Status: None (Preliminary result)   Collection Time: 11/22/18  7:48 AM   Specimen: Bronchoalveolar Lavage; Respiratory  Result Value Ref Range Status   Specimen Description   Final    BRONCHIAL ALVEOLAR LAVAGE Performed at [facility], [address] [place], [place] [address]    Special Requests   Final    Immunocompromised Performed at [facility], [place], [place] [address]    Gram Stain   Final    FEW WBC PRESENT, PREDOMINANTLY PMN FEW GRAM POSITIVE COCCI IN PAIRS IN CLUSTERS FEW GRAM POSITIVE  RODS Performed at [facility], [place] [address] [place] [address] [place], [place] [address]    Culture PENDING  Incomplete   Report Status PENDING  Incomplete          IMAGING    Dg Chest Port 1 View  Result Date: 11/22/2018 CLINICAL DATA:  Ventilator support.  Follow-up. EXAM: PORTABLE CHEST 1 VIEW COMPARISON:  Earlier same day FINDINGS: Endotracheal tube tip is 3 cm above the carina. Orogastric or nasogastric tube enters the abdomen. Right internal jugular central line tip in the SVC above the right atrium. Persistent bilateral lower lung atelectasis and or pneumonia. No worsening or new finding. IMPRESSION: No apparent change since earlier today. Lines and tubes well position. Persistent atelectasis and or pneumonia in the mid and lower lungs. Electronically Signed   By: Nelson Chimes M.D.   On: 11/22/2018 21:23   Dg Chest Port 1 View  Result Date: 11/22/2018 CLINICAL DATA:  Central line  placement EXAM: PORTABLE CHEST 1 VIEW COMPARISON:  11/22/2018 FINDINGS: [name] rotated. Interval placement of a right IJ central venous catheter with distal tip terminating at the expected level of the superior cavoatrial junction. ET tube remains in place with distal tip terminating 3.8 cm superior to the carina. Enteric tube courses below the diaphragm with distal tip beyond the inferior margin of the film. Multiple overlying external leads. Cardiac silhouette is largely obscured but appears enlarged. Bilateral pleural effusions. Volume loss within the right lung. Slight improved aeration of the right lung compared to prior. No pneumothorax visualized. IMPRESSION: 1. Interval placement of right IJ central venous catheter. No pneumothorax. 2. Improving aeration of the right lung. Electronically Signed   By: Davina Poke M.D.   On: 11/22/2018 14:26   Dg Chest Port 1 View  Result Date: 11/22/2018 CLINICAL DATA:  Multiple tracheobronchial mucus plugs, history CHF, diabetes mellitus, atrial fibrillation, hypertension  EXAM: PORTABLE CHEST 1 VIEW COMPARISON:  Portable exam 0830 hours compared to 0035 hours FINDINGS: Tip of endotracheal tube projects 4.1 cm above carina. Nasogastric tube extends into abdomen. Volume loss in the RIGHT hemithorax with slight mediastinal shift to the RIGHT, likely reflecting a combination of atelectasis and question effusion. LEFT pleural effusion and basilar atelectasis are present. No pneumothorax. Bones demineralized. IMPRESSION: Atelectasis and probable pleural effusion opacified RIGHT hemithorax with volume loss. Persistent LEFT pleural effusion and basilar atelectasis. Electronically Signed   By: Lavonia Dana M.D.   On: 11/22/2018 08:44       Indwelling Urinary Catheter continued, requirement due to   Reason to continue Indwelling Urinary Catheter strict Intake/Output monitoring for hemodynamic instability   Central Line/ continued, requirement due to  Reason to continue Leslie of central venous pressure or other hemodynamic parameters and poor IV access   Ventilator continued, requirement due to severe respiratory failure   Ventilator Sedation RASS 0 to -2      ASSESSMENT AND PLAN SYNOPSIS Severe ACUTE Hypoxic and Hypercapnic Respiratory Failurefrom pulm edema with effusions and progressiove renal failure with OHS and OSA with morbid obesity with pneumonia/ARDS  Severe ACUTE Hypoxic and Hypercapnic Respiratory Failure -continue Full MV support -continue Bronchodilator Therapy -Wean Fio2 and PEEP as tolerated -will perform SAT/SBT when respiratory parameters are met Unable to wean from vent Now DNR  ACUTE SYSTOLIC CARDIAC FAILURE- HFpEF -oxygen as needed -Lasix as tolerated    ACUTE KIDNEY INJURY/Renal Failure -follow chem 7 -follow UO -continue Foley Catheter-assess need -Avoid nephrotoxic agents -Recheck creatinine    NEUROLOGY - intubated and sedated - minimal sedation to achieve a RASS goal: -1    CARDIAC ICU  monitoring  ID -continue IV abx as prescibed -follow up cultures  GI GI PROPHYLAXIS as indicated  NUTRITIONAL STATUS DIET-->TF's as tolerated Constipation protocol as indicated  ENDO - will use ICU hypoglycemic\Hyperglycemia protocol if indicated   ELECTROLYTES -follow labs as needed -replace as needed -pharmacy consultation and following   DVT/GI PRX ordered TRANSFUSIONS AS NEEDED MONITOR FSBS ASSESS the need for LABS as needed   Critical Care Time devoted to patient care services described in this note is 33 minutes.   Overall, patient is critically ill, prognosis is guarded.  Patient with Multiorgan failure and at high risk for cardiac arrest and death.   [name] DNR  Corrin Parker, M.D.  [facility]  Medical Director [place] Director [facility] Cardio-Pulmonary Department

## 2018-11-23 NOTE — Progress Notes (Signed)
[name] sister and girlfriend updated via telephone.

## 2018-11-24 ENCOUNTER — Inpatient Hospital Stay: Payer: Medicare Other

## 2018-11-24 LAB — BLOOD GAS, ARTERIAL
Acid-Base Excess: 9 mmol/L — ABNORMAL HIGH (ref 0.0–2.0)
Bicarbonate: 30.7 mmol/L — ABNORMAL HIGH (ref 20.0–28.0)
FIO2: 1
O2 Saturation: 95.9 %
PEEP: 15 cmH2O
Patient temperature: 37
Pressure control: 30 cmH2O
RATE: 24 resp/min
pCO2 arterial: 32 mmHg (ref 32.0–48.0)
pH, Arterial: 7.59 — ABNORMAL HIGH (ref 7.350–7.450)
pO2, Arterial: 67 mmHg — ABNORMAL LOW (ref 83.0–108.0)

## 2018-11-24 LAB — CBC WITH DIFFERENTIAL/PLATELET
Abs Immature Granulocytes: 0.09 10*3/uL — ABNORMAL HIGH (ref 0.00–0.07)
Basophils Absolute: 0 10*3/uL (ref 0.0–0.1)
Basophils Relative: 0 %
Eosinophils Absolute: 0 10*3/uL (ref 0.0–0.5)
Eosinophils Relative: 0 %
HCT: 47.2 % (ref 39.0–52.0)
Hemoglobin: 14.9 g/dL (ref 13.0–17.0)
Immature Granulocytes: 1 %
Lymphocytes Relative: 4 %
Lymphs Abs: 0.4 10*3/uL — ABNORMAL LOW (ref 0.7–4.0)
MCH: 30.3 pg (ref 26.0–34.0)
MCHC: 31.6 g/dL (ref 30.0–36.0)
MCV: 96.1 fL (ref 80.0–100.0)
Monocytes Absolute: 1 10*3/uL (ref 0.1–1.0)
Monocytes Relative: 10 %
Neutro Abs: 8.8 10*3/uL — ABNORMAL HIGH (ref 1.7–7.7)
Neutrophils Relative %: 85 %
Platelets: 139 10*3/uL — ABNORMAL LOW (ref 150–400)
RBC: 4.91 MIL/uL (ref 4.22–5.81)
RDW: 13.1 % (ref 11.5–15.5)
WBC: 10.3 10*3/uL (ref 4.0–10.5)
nRBC: 0 % (ref 0.0–0.2)

## 2018-11-24 LAB — PHOSPHORUS: Phosphorus: 3.3 mg/dL (ref 2.5–4.6)

## 2018-11-24 LAB — MAGNESIUM: Magnesium: 2.7 mg/dL — ABNORMAL HIGH (ref 1.7–2.4)

## 2018-11-24 LAB — HEPARIN LEVEL (UNFRACTIONATED)
Heparin Unfractionated: 0.79 IU/mL — ABNORMAL HIGH (ref 0.30–0.70)
Heparin Unfractionated: 0.91 IU/mL — ABNORMAL HIGH (ref 0.30–0.70)
Heparin Unfractionated: 0.75 IU/mL — ABNORMAL HIGH (ref 0.30–0.70)

## 2018-11-24 LAB — BASIC METABOLIC PANEL
Anion gap: 12 (ref 5–15)
BUN: 77 mg/dL — ABNORMAL HIGH (ref 6–20)
CO2: 28 mmol/L (ref 22–32)
Calcium: 8.4 mg/dL — ABNORMAL LOW (ref 8.9–10.3)
Chloride: 105 mmol/L (ref 98–111)
Creatinine, Ser: 1.87 mg/dL — ABNORMAL HIGH (ref 0.61–1.24)
GFR calc Af Amer: 45 mL/min — ABNORMAL LOW (ref >60)
GFR calc non Af Amer: 38 mL/min — ABNORMAL LOW (ref >60)
Glucose, Bld: 276 mg/dL — ABNORMAL HIGH (ref 70–99)
Potassium: 4.6 mmol/L (ref 3.5–5.1)
Sodium: 145 mmol/L (ref 135–145)

## 2018-11-24 LAB — GLUCOSE, CAPILLARY
Glucose-Capillary: 248 mg/dL — ABNORMAL HIGH (ref 70–99)
Glucose-Capillary: 265 mg/dL — ABNORMAL HIGH (ref 70–99)
Glucose-Capillary: 271 mg/dL — ABNORMAL HIGH (ref 70–99)
Glucose-Capillary: 277 mg/dL — ABNORMAL HIGH (ref 70–99)
Glucose-Capillary: 281 mg/dL — ABNORMAL HIGH (ref 70–99)
Glucose-Capillary: 345 mg/dL — ABNORMAL HIGH (ref 70–99)

## 2018-11-24 MED ORDER — VECURONIUM BROMIDE 10 MG IV SOLR
20.0000 mg | Freq: Once | INTRAVENOUS | Status: AC
  Administered 2018-11-24: 03:00:00 20 mg via INTRAVENOUS
  Filled 2018-11-24: qty 20

## 2018-11-24 MED ORDER — FUROSEMIDE 10 MG/ML IJ SOLN
80.0000 mg | Freq: Once | INTRAMUSCULAR | Status: AC
  Administered 2018-11-24: 80 mg via INTRAVENOUS
  Filled 2018-11-24: qty 8

## 2018-11-24 MED ORDER — AMIODARONE IV BOLUS ONLY 150 MG/100ML
150.0000 mg | Freq: Once | INTRAVENOUS | Status: AC
  Administered 2018-11-24: 09:00:00 150 mg via INTRAVENOUS

## 2018-11-24 MED ORDER — IPRATROPIUM-ALBUTEROL 0.5-2.5 (3) MG/3ML IN SOLN
3.0000 mL | Freq: Four times a day (QID) | RESPIRATORY_TRACT | Status: DC
  Administered 2018-11-24 – 2018-12-12 (×73): 3 mL via RESPIRATORY_TRACT
  Filled 2018-11-24 (×72): qty 3

## 2018-11-24 MED ORDER — VITAL HIGH PROTEIN PO LIQD
1000.0000 mL | ORAL | Status: DC
  Administered 2018-11-25 – 2018-11-26 (×2): 1000 mL

## 2018-11-24 NOTE — Consult Note (Signed)
[facility] CONSULT NOTE    Date: 11/24/2018                  Patient Name:  Kyle White  MRN: [id number]  DOB: Jun 06, 1960  Age / Sex: 58 y.o., male         PCP: [facility]                 Service Requesting Consult: Critical care                 Reason for Consult: Acute renal failure            History of Present Illness: [name] a 58 y.o. male with a PMHx of atrial fibrillation on Coumadin, congestive heart failure, diabetes mellitus type 2, obstructive sleep apnea, morbid obesity who was admitted to [facility] on 11/12/2018 for evaluation of increasing shortness of breath.  Patient unable to offer any history at this point time therefore history was obtained through chart review and discussion with critical care.  It appears that at home patient was having increasing shortness of breath.  Initial chest x-ray showed pulmonary vascular congestion.  He had deterioration over the course of hospitalization and is currently on the ventilator and multiple drips.  We are asked to see him for evaluation management of acute renal failure.  His presenting creatinine was 1.3.  BUN currently up to 77 with a creatinine of 1.87.  However he does have good urine output at the moment.   Medications: Outpatient medications: Medications Prior to Admission  Medication Sig Dispense Refill Last Dose  . aspirin EC 81 MG tablet Take 81 mg by mouth daily.   11/15/2018 at Unknown time  . colchicine 0.6 MG tablet Take 12 mg by mouth. At first sing of gout flare   prn at prn  . diltiazem (CARDIZEM CD) 240 MG 24 hr capsule Take 240 mg by mouth daily.   11/15/2018 at Unknown time  . furosemide (LASIX) 40 MG tablet Take 40 mg by mouth 2 (two) times daily.   11/15/2018 at Unknown time  . lisinopril (ZESTRIL) 5 MG tablet Take 5 mg by mouth daily.   11/15/2018 at Unknown time  . metFORMIN (GLUCOPHAGE) 500 MG tablet Take 500 mg by mouth 2 (two) times daily with a meal.   11/15/2018  at Unknown time  . metoprolol tartrate (LOPRESSOR) 100 MG tablet Take 50 mg by mouth 2 (two) times daily.   11/15/2018 at Unknown time  . pravastatin (PRAVACHOL) 80 MG tablet Take 80 mg by mouth every evening.   11/15/2018 at Unknown time  . warfarin (COUMADIN) 4 MG tablet Take 2.5-4 mg by mouth daily. Take 4 mg on Sunday, Tuesday, Wednesday, Thursday, and Saturday. Take 2.5 mg on Monday and Friday   11/15/2018 at Unknown time    Current medications: Current Facility-Administered Medications  Medication Dose Route Frequency Provider Last Rate Last Dose  . acetaminophen (TYLENOL) tablet 650 mg  650 mg Per NG tube Q4H PRN Tukov-Yual, Magdalene S, NP   650 mg at 11/24/18 0018  . allopurinol (ZYLOPRIM) tablet 100 mg  100 mg Per Tube Daily Darel Hong D, NP   100 mg at 11/24/18 0950  . ALPRAZolam Duanne Moron) tablet 0.5 mg  0.5 mg Per Tube BID Darel Hong D, NP   0.5 mg at 11/24/18 0951  . amiodarone (NEXTERONE PREMIX) 360-4.14 MG/200ML-% (1.8 mg/mL) IV infusion  60 mg/hr Intravenous Continuous Flora Lipps, MD 33.3 mL/hr at  11/24/18 0958 60 mg/hr at 11/24/18 0958  . aspirin chewable tablet 81 mg  81 mg Per NG tube Daily Tukov-Yual, Magdalene S, NP   81 mg at 11/24/18 0951  . bisacodyl (DULCOLAX) suppository 10 mg  10 mg Rectal Daily PRN Tukov-Yual, Magdalene S, NP      . ceFEPIme (MAXIPIME) 2 g in sodium chloride 0.9 % 100 mL IVPB  2 g Intravenous Q8H Tawnya Crook, RPH   Stopped at 11/24/18 0601  . chlorhexidine gluconate (MEDLINE KIT) (PERIDEX) 0.12 % solution 15 mL  15 mL Mouth Rinse BID Flora Lipps, MD   15 mL at 11/24/18 0848  . Chlorhexidine Gluconate Cloth 2 % PADS 6 each  6 each Topical Daily Flora Lipps, MD   6 each at 11/23/18 2249  . dexmedetomidine (PRECEDEX) 400 MCG/100ML (4 mcg/mL) infusion  0.4-1.2 mcg/kg/hr Intravenous Titrated Darel Hong D, NP 21.3 mL/hr at 11/24/18 0756 0.5 mcg/kg/hr at 11/24/18 0756  . diltiazem (CARDIZEM) 100 mg in dextrose 5 % 100 mL (1 mg/mL) infusion   5-15 mg/hr Intravenous Titrated Ottie Glazier, MD   Stopped at 11/21/18 0201  . feeding supplement (PRO-STAT SUGAR FREE 64) liquid 60 mL  60 mL Per Tube TID Flora Lipps, MD   60 mL at 11/24/18 0952  . feeding supplement (VITAL HIGH PROTEIN) liquid 1,000 mL  1,000 mL Per Tube Continuous Flora Lipps, MD 30 mL/hr at 11/24/18 0753    . heparin ADULT infusion 100 units/mL (25000 units/228m sodium chloride 0.45%)  1,300 Units/hr Intravenous Continuous HHart RobinsonsA, RPH 13 mL/hr at 11/24/18 0615 1,300 Units/hr at 11/24/18 0615  . HYDROmorphone (DILAUDID) 50 mg in sodium chloride 0.9 % 100 mL (0.5 mg/mL) infusion  2 mg/hr Intravenous Continuous KDarel HongD, NP 4 mL/hr at 11/24/18 1012 2 mg/hr at 11/24/18 1012  . insulin aspart (novoLOG) injection 0-15 Units  0-15 Units Subcutaneous Q4H Tukov-Yual, Magdalene S, NP   8 Units at 11/24/18 0802  . insulin aspart (novoLOG) injection 4 Units  4 Units Subcutaneous Q4H KFlora Lipps MD   4 Units at 11/24/18 0801  . insulin detemir (LEVEMIR) injection 10 Units  10 Units Subcutaneous Daily KFlora Lipps MD   10 Units at 11/24/18 0954  . ipratropium-albuterol (DUONEB) 0.5-2.5 (3) MG/3ML nebulizer solution 3 mL  3 mL Nebulization Q4H Tukov-Yual, Magdalene S, NP   3 mL at 11/24/18 0814  . MEDLINE mouth rinse  15 mL Mouth Rinse 10 times per day KFlora Lipps MD   15 mL at 11/24/18 1054  . methylPREDNISolone sodium succinate (SOLU-MEDROL) 40 mg/mL injection 40 mg  40 mg Intravenous BID KFlora Lipps MD   40 mg at 11/24/18 0831  . metoprolol tartrate (LOPRESSOR) injection 2.5-5 mg  2.5-5 mg Intravenous Q6H PRN KDarel HongD, NP      . metoprolol tartrate (LOPRESSOR) tablet 50 mg  50 mg Per NG tube BID Tukov-Yual, Magdalene S, NP   50 mg at 11/24/18 0952  . midazolam (VERSED) 50 mg/50 mL (1 mg/mL) premix infusion  0.5-10 mg/hr Intravenous Continuous KDarel HongD, NP 4.5 mL/hr at 11/24/18 1020 4.5 mg/hr at 11/24/18 1020  . midazolam (VERSED) injection 2-4 mg   2-4 mg Intravenous Q2H PRN Tukov-Yual, Magdalene S, NP   2 mg at 11/24/18 0259  . multivitamin liquid 15 mL  15 mL Per Tube Daily KFlora Lipps MD   15 mL at 11/24/18 0953  . norepinephrine (LEVOPHED) 444min 25014mremix infusion  0-40 mcg/min Intravenous Titrated BlaMarda Stalker  G, NP   Stopped at 11/24/18 0333  . ondansetron (ZOFRAN) injection 4 mg  4 mg Intravenous Q6H PRN Seals, Levada Dy H, NP      . pantoprazole sodium (PROTONIX) 40 mg/20 mL oral suspension 40 mg  40 mg Per Tube QHS Charlett Nose, RPH   40 mg at 11/23/18 2126  . phenylephrine (NEO-SYNEPHRINE) 40 mg in sodium chloride 0.9 % 250 mL (0.16 mg/mL) infusion  0-400 mcg/min Intravenous Titrated Bradly Bienenstock, NP   Stopped at 11/23/18 0241  . potassium chloride (KLOR-CON) packet 40 mEq  40 mEq Per Tube Once Max Sane, MD      . pravastatin (PRAVACHOL) tablet 80 mg  80 mg Per NG tube QPM Tukov-Yual, Magdalene S, NP   80 mg at 11/23/18 1815  . QUEtiapine (SEROQUEL) tablet 25 mg  25 mg Per Tube QHS Darel Hong D, NP   25 mg at 11/23/18 2125  . senna-docusate (Senokot-S) tablet 2 tablet  2 tablet Oral BID Flora Lipps, MD   2 tablet at 11/24/18 [phone]  . sennosides (SENOKOT) 8.8 MG/5ML syrup 5 mL  5 mL Per Tube BID PRN Tukov-Yual, Magdalene S, NP   5 mL at 11/21/18 0743  . sodium chloride flush (NS) 0.9 % injection 10-40 mL  10-40 mL Intracatheter PRN Demetrios Loll, MD      . sodium chloride flush (NS) 0.9 % injection 10-40 mL  10-40 mL Intracatheter Q12H Flora Lipps, MD   10 mL at 11/24/18 0928  . sodium chloride flush (NS) 0.9 % injection 10-40 mL  10-40 mL Intracatheter PRN Flora Lipps, MD      . vecuronium (NORCURON) injection 10 mg  10 mg Intravenous Q2H PRN Tukov-Yual, Magdalene S, NP   10 mg at 11/22/18 2130      Allergies: No Known Allergies    Past Medical History: Past Medical History:  Diagnosis Date  . Atrial fibrillation ([place])   . CHF (congestive heart failure) ([place])   . Diabetes mellitus without  complication ([place])   . Gout   . Hypertension associated with type 2 diabetes mellitus ([place])   . Morbid obesity ([place])   . Obstructive sleep apnea      Past Surgical History: Past Surgical History:  Procedure Laterality Date  . MITRAL VALVE REPAIR       Family History: History reviewed. No pertinent family history.   Social History: Social History   Socioeconomic History  . Marital status: Unknown    Spouse name: Not on file  . Number of children: Not on file  . Years of education: Not on file  . Highest education level: Not on file  Occupational History  . Not on file  Social Needs  . Financial resource strain: Not on file  . Food insecurity    Worry: Not on file    Inability: Not on file  . Transportation needs    Medical: Not on file    Non-medical: Not on file  Tobacco Use  . Smoking status: Never Smoker  . Smokeless tobacco: Never Used  Substance and Sexual Activity  . Alcohol use: Never    Frequency: Never  . Drug use: Never  . Sexual activity: Not on file  Lifestyle  . Physical activity    Days per week: Not on file    Minutes per session: Not on file  . Stress: Not on file  Relationships  . Social connections    Talks on phone: Not on file  Gets together: Not on file    Attends religious service: Not on file    Active member of club or organization: Not on file    Attends meetings of clubs or organizations: Not on file    Relationship status: Not on file  . Intimate partner violence    Fear of current or ex partner: Not on file    Emotionally abused: Not on file    Physically abused: Not on file    Forced sexual activity: Not on file  Other Topics Concern  . Not on file  Social History Narrative  . Not on file     Review of Systems: Unable to obtain from the patient as he is currently on the ventilator.  Vital Signs: Blood pressure (!) 87/74, pulse 72, temperature 99.5 F (37.5 C), temperature source Axillary, resp. rate 20, height  5' 9.02" (1.753 m), weight (!) 207.3 kg, SpO2 93 %.  Weight trends: Filed Weights   11/21/18 0500 11/22/18 0500  Weight: (!) 168.8 kg (!) 207.3 kg    Physical Exam: General: Critically ill-appearing  Head: Normocephalic, atraumatic.  Eyes: Anicteric, EOMI  Nose: Mucous membranes moist, not inflammed, nonerythematous.  Throat: Endotracheal tube in place  Neck: Supple, trachea midline.  Lungs:  Scattered rhonchi and rales, vent assisted  Heart: Irregular and tachycardic.  Abdomen:  Obese, distended  Extremities: 2+ lower extremity edema  Neurologic: Intubated, sedated  Skin: No visible rashes, scars.    Lab results: Basic Metabolic Panel: Recent Labs  Lab 11/18/18 0501  11/22/18 0429 11/22/18 2205 11/23/18 0447 11/23/18 1320 11/24/18 0410  NA 138   < > 141  --  144 144 145  K 3.9   < > 4.7 5.5* 5.4* 5.4* 4.6  CL 96*   < > 103  --  106 105 105  CO2 29   < > 28  --  _0 GLUCOSE 210*   < > 304*  --  280* 311* 276*  BUN 41*   < > 65*  --  78* 74* 77*  CREATININE 1.36*   < > 1.51*  --  2.01* 1.89* 1.87*  CALCIUM 8.7*   < > 8.7*  --  8.5* 8.5* 8.4*  MG 2.5*  --  3.1* 3.3*  --   --  2.7*  PHOS 3.8  --  5.3*  --   --   --  3.3   < > = values in this interval not displayed.    Liver Function Tests: No results for input(s): AST, ALT, ALKPHOS, BILITOT, PROT, ALBUMIN in the last 168 hours. No results for input(s): LIPASE, AMYLASE in the last 168 hours. No results for input(s): AMMONIA in the last 168 hours.  CBC: Recent Labs  Lab 11/20/18 0416 11/21/18 0346 11/22/18 0429 11/23/18 0447 11/24/18 0410  WBC 6.2 6.5 7.2 7.5 10.3  NEUTROABS 5.1  --  6.3 6.5 8.8*  HGB 13.4 14.1 14.9 13.8 14.9  HCT 41.9 43.1 46.4 44.9 47.2  MCV 93.3 91.1 93.5 97.4 96.1  PLT 159 164 156 140* 139*    Cardiac Enzymes: No results for input(s): CKTOTAL, CKMB, CKMBINDEX, TROPONINI in the last 168 hours.  BNP: Invalid input(s): POCBNP  CBG: Recent Labs  Lab 11/23/18 1610  11/23/18 1924 11/23/18 2351 11/24/18 0329 11/24/18 0730  GLUCAP 305* 220* 249* 271* 265*    Microbiology: Results for orders placed or performed during the hospital encounter of 10/28/2018  SARS CORONAVIRUS 2 (TAT 6-12 HRS) Nasal Swab  Aptima Multi Swab     Status: None   Collection Time: 11/12/2018  4:55 PM   Specimen: Aptima Multi Swab; Nasal Swab  Result Value Ref Range Status   SARS Coronavirus 2 NEGATIVE NEGATIVE Final    Comment: (NOTE) SARS-CoV-2 target nucleic acids are NOT DETECTED. The SARS-CoV-2 RNA is generally detectable in upper and lower respiratory specimens during the acute phase of infection. Negative results do not preclude SARS-CoV-2 infection, do not rule out co-infections with other pathogens, and should not be used as the sole basis for treatment or other patient management decisions. Negative results must be combined with clinical observations, patient history, and epidemiological information. The expected result is Negative. Fact Sheet for Patients: SugarRoll.be Fact Sheet for Healthcare Providers: https://www.woods-mathews.com/ This test is not yet approved or cleared by the [place] FDA and  has been authorized for detection and/or diagnosis of SARS-CoV-2 by FDA under an Emergency Use Authorization (EUA). This EUA will remain  in effect (meaning this test can be used) for the duration of the COVID-19 declaration under Section 56 4(b)(1) of the Act, 21 U.S.C. section 360bbb-3(b)(1), unless the authorization is terminated or revoked sooner. Performed at [facility], [place] [address], [place], [place] [address]   SARS Coronavirus 2 [facility] order, Performed in [facility] hospital lab) Nasopharyngeal Nasopharyngeal Swab     Status: None   Collection Time: 10/27/2018 10:34 PM   Specimen: Nasopharyngeal Swab  Result Value Ref Range Status   SARS Coronavirus 2 NEGATIVE NEGATIVE Final    Comment: (NOTE) If  result is NEGATIVE SARS-CoV-2 target nucleic acids are NOT DETECTED. The SARS-CoV-2 RNA is generally detectable in upper and lower  respiratory specimens during the acute phase of infection. The lowest  concentration of SARS-CoV-2 viral copies this assay can detect is 250  copies / mL. A negative result does not preclude SARS-CoV-2 infection  and should not be used as the sole basis for treatment or other  patient management decisions.  A negative result may occur with  improper specimen collection / handling, submission of specimen other  than nasopharyngeal swab, presence of viral mutation(s) within the  areas targeted by this assay, and inadequate number of viral copies  (<250 copies / mL). A negative result must be combined with clinical  observations, patient history, and epidemiological information. If result is POSITIVE SARS-CoV-2 target nucleic acids are DETECTED. The SARS-CoV-2 RNA is generally detectable in upper and lower  respiratory specimens dur ing the acute phase of infection.  Positive  results are indicative of active infection with SARS-CoV-2.  Clinical  correlation with patient history and other diagnostic information is  necessary to determine patient infection status.  Positive results do  not rule out bacterial infection or co-infection with other viruses. If result is PRESUMPTIVE POSTIVE SARS-CoV-2 nucleic acids MAY BE PRESENT.   A presumptive positive result was obtained on the submitted specimen  and confirmed on repeat testing.  While 2019 novel coronavirus  (SARS-CoV-2) nucleic acids may be present in the submitted sample  additional confirmatory testing may be necessary for epidemiological  and / or clinical management purposes  to differentiate between  SARS-CoV-2 and other Sarbecovirus currently known to infect humans.  If clinically indicated additional testing with an alternate test  methodology [phone]) is advised. The SARS-CoV-2 RNA is generally   detectable in upper and lower respiratory sp ecimens during the acute  phase of infection. The expected result is Negative. Fact Sheet for Patients:  StrictlyIdeas.no Fact Sheet  for Healthcare Providers: BankingDealers.co.za This test is not yet approved or cleared by the [place] and has been authorized for detection and/or diagnosis of SARS-CoV-2 by FDA under an Emergency Use Authorization (EUA).  This EUA will remain in effect (meaning this test can be used) for the duration of the COVID-19 declaration under Section 564(b)(1) of the Act, 21 U.S.C. section 360bbb-3(b)(1), unless the authorization is terminated or revoked sooner. Performed at [facility], [address] [place], [place] [address]   Culture, respiratory (non-expectorated)     Status: None   Collection Time: 10/31/2018 10:34 PM   Specimen: Tracheal Aspirate; Respiratory  Result Value Ref Range Status   Specimen Description   Final    TRACHEAL ASPIRATE Performed at [facility], [address] [place], [place] [address]    Special Requests   Final    NONE Performed at [facility], [address] [place], [place] [address]    Gram Stain   Final    RARE WBC PRESENT, PREDOMINANTLY PMN ABUNDANT GRAM POSITIVE COCCI IN CHAINS    Culture   Final    Consistent with normal respiratory flora. Performed at [facility], [place] [address], [place], [place] [address]    Report Status 11/19/2018 FINAL  Final  Urine Culture     Status: None   Collection Time: 11/19/2018 11:12 PM   Specimen: Urine, Random  Result Value Ref Range Status   Specimen Description   Final    URINE, RANDOM Performed at [facility], [address], [place], [place] [address]    Special Requests   Final    NONE Performed at [facility], [address] [place], [place] [address]    Culture   Final    NO GROWTH Performed at [facility], [place] [address], [place], [place] [address]    Report Status 11/18/2018 FINAL  Final  MRSA PCR Screening     Status: None   Collection Time: 11/12/2018 11:28 PM   Specimen: Nasopharyngeal  Result Value Ref Range Status   MRSA by PCR NEGATIVE NEGATIVE Final    Comment:        The GeneXpert MRSA Assay (FDA approved for NASAL specimens only), is one component of a comprehensive MRSA colonization surveillance program. It is not intended to diagnose MRSA infection nor to guide or monitor treatment for MRSA infections. Performed at [facility], [address] [place], [place] [address]   Culture, blood (Routine X 2) w Reflex to ID Panel     Status: None   Collection Time: 11/17/18 12:19 AM   Specimen: BLOOD  Result Value Ref Range Status   Specimen Description BLOOD RIGHT ANTECUBITAL  Final   Special Requests   Final    BOTTLES DRAWN AEROBIC AND ANAEROBIC Blood Culture adequate volume   Culture   Final    NO GROWTH 5 DAYS Performed at [facility], [address] [place], [place] [address]    Report Status 11/22/2018 FINAL  Final  Culture, blood (Routine X 2) w Reflex to ID Panel     Status: None   Collection Time: 11/17/18 12:31 AM   Specimen: BLOOD  Result Value Ref Range Status   Specimen Description BLOOD CENTRAL LINE MIDLINE  Final   Special Requests   Final    BOTTLES DRAWN AEROBIC AND ANAEROBIC Blood Culture adequate volume   Culture   Final    NO GROWTH 5 DAYS  Performed at [facility], [address] [place], [place] [address]    Report Status 11/22/2018 FINAL  Final  Culture, bal-quantitative     Status: Abnormal (Preliminary result)   Collection Time: 11/22/18  7:48 AM   Specimen: Bronchoalveolar Lavage; Respiratory  Result Value Ref Range Status   Specimen Description   Final    BRONCHIAL ALVEOLAR LAVAGE Performed at [facility], [address] [place], [place] [address]    Special Requests   Final     Immunocompromised Performed at [facility], [place], [place] [address]    Gram Stain   Final    FEW WBC PRESENT, PREDOMINANTLY PMN FEW GRAM POSITIVE COCCI IN PAIRS IN CLUSTERS FEW GRAM POSITIVE RODS    Culture (A)  Final    >=100,000 COLONIES/mL STAPHYLOCOCCUS SIMULANS >=100,000 COLONIES/mL VIRIDANS STREPTOCOCCUS CULTURE REINCUBATED FOR BETTER GROWTH Performed at [facility], [place] [address] [place], [place] [address]    Report Status PENDING  Incomplete    Coagulation Studies: Recent Labs    11/22/18 0429 11/23/18 0447  LABPROT 29.5* 20.4*  INR 2.9* 1.8*    Urinalysis: No results for input(s): COLORURINE, LABSPEC, PHURINE, GLUCOSEU, HGBUR, BILIRUBINUR, KETONESUR, PROTEINUR, UROBILINOGEN, NITRITE, LEUKOCYTESUR in the last 72 hours.  Invalid input(s): APPERANCEUR    Imaging: Dg Chest Port 1 View  Result Date: 11/24/2018 CLINICAL DATA:  Shortness of breath EXAM: PORTABLE CHEST 1 VIEW COMPARISON:  November 22, 2018 FINDINGS: Endotracheal tube tip is seen 3.5 cm above the level of carina. NG tube is seen coursing below the diaphragm. A right-sided central venous catheter is seen within the mid SVC./slight interval improved aeration since prior exam. Probable subsegmental atelectasis remains at the lung bases. There is a retrocardiac opacity. IMPRESSION: Slight interval improvement in aeration. However there remains a retrocardiac opacity which could be atelectasis and/or infectious etiology. Electronically Signed   By: Prudencio Pair M.D.   On: 11/24/2018 03:51   Dg Chest Port 1 View  Result Date: 11/22/2018 CLINICAL DATA:  Ventilator support.  Follow-up. EXAM: PORTABLE CHEST 1 VIEW COMPARISON:  Earlier same day FINDINGS: Endotracheal tube tip is 3 cm above the carina. Orogastric or nasogastric tube enters the abdomen. Right internal jugular central line tip in the SVC above the right atrium. Persistent bilateral lower lung atelectasis and or  pneumonia. No worsening or new finding. IMPRESSION: No apparent change since earlier today. Lines and tubes well position. Persistent atelectasis and or pneumonia in the mid and lower lungs. Electronically Signed   By: Nelson Chimes M.D.   On: 11/22/2018 21:23   Dg Chest Port 1 View  Result Date: 11/22/2018 CLINICAL DATA:  Central line placement EXAM: PORTABLE CHEST 1 VIEW COMPARISON:  11/22/2018 FINDINGS: [name] rotated. Interval placement of a right IJ central venous catheter with distal tip terminating at the expected level of the superior cavoatrial junction. ET tube remains in place with distal tip terminating 3.8 cm superior to the carina. Enteric tube courses below the diaphragm with distal tip beyond the inferior margin of the film. Multiple overlying external leads. Cardiac silhouette is largely obscured but appears enlarged. Bilateral pleural effusions. Volume loss within the right lung. Slight improved aeration of the right lung compared to prior. No pneumothorax visualized. IMPRESSION: 1. Interval placement of right IJ central venous catheter. No pneumothorax. 2. Improving aeration of the right lung. Electronically Signed   By: Davina Poke M.D.   On: 11/22/2018 14:26  Assessment & Plan: Pt is a 58 y.o. male with a PMHx of atrial fibrillation on Coumadin, congestive heart failure, diabetes mellitus type 2, obstructive sleep apnea, morbid obesity who was admitted to [facility] on 10/23/2018 for evaluation of increasing shortness of breath.  1.  Acute renal failure/chronic kidney disease stage III.  Baseline creatinine appears to be 1.3 with an EGFR 57.  Suspect acute renal failure now related to cardiorenal syndrome.  BUN currently 77 with a creatinine of 1.87.  No indication for dialysis at the moment.  Obtain renal ultrasound to make sure there is no underlying obstruction.  2.  Acute respiratory failure.  Patient still maintained on the ventilator with FiO2 of 100%.  Suspect that  weaning will be quite difficult in this situation.  3.  Overall prognosis is guarded at the moment.  Further plan as patient progresses.

## 2018-11-24 NOTE — Progress Notes (Signed)
CRITICAL CARE NOTE 58 yo w/hx of morbid obesity, HFpEF hx MVR, CHF, PAF, OSA, DM, AHRF came in with acute hypoxemic hypercarbic resp failure with CXR showing b/l pl effusions and pulm edema. CT with restrictive physiology, pulm edema, bilateral pleural effusions and compressive atelectasiss/p extubation on bIPAP Re-intubated 8/31 Severe hypoxia  CC  follow up respiratory failure  SUBJECTIVE Patient remains critically ill Prognosis is guarded Severe resp failure PEEp 15 fio2 100%    BP 127/79   Pulse (!) 40   Temp (!) 100.5 F (38.1 C) (Oral)   Resp 20   Ht 5' 9.02" (1.753 m)   Wt (!) 207.3 kg   SpO2 92%   BMI 67.46 kg/m    I/O last 3 completed shifts: In: 5014.1 [I.V.:2554.3; NG/GT:1959.8; IV Piggyback:500.1] Out: 8420 [Urine:8420] No intake/output data recorded.  SpO2: 92 % O2 Flow Rate (L/min): 3 L/min FiO2 (%): 100 %  Vent Mode: PCV FiO2 (%):  [100 %] 100 % Set Rate:  [20 bmp-24 bmp] 20 bmp Vt Set:  [500 mL] 500 mL PEEP:  [15 cmH20] 15 cmH20 Plateau Pressure:  [16 cmH20-29 cmH20] 29 cmH20  CBC    Component Value Date/Time   WBC 10.3 11/24/2018 0410   RBC 4.91 11/24/2018 0410   HGB 14.9 11/24/2018 0410   HCT 47.2 11/24/2018 0410   PLT 139 (L) 11/24/2018 0410   MCV 96.1 11/24/2018 0410   MCH 30.3 11/24/2018 0410   MCHC 31.6 11/24/2018 0410   RDW 13.1 11/24/2018 0410   LYMPHSABS 0.4 (L) 11/24/2018 0410   MONOABS 1.0 11/24/2018 0410   EOSABS 0.0 11/24/2018 0410   BASOSABS 0.0 11/24/2018 0410   BMP Latest Ref Rng & Units 11/24/2018 11/23/2018 11/23/2018  Glucose 70 - 99 mg/dL 276(H) 311(H) 280(H)  BUN 6 - 20 mg/dL 77(H) 74(H) 78(H)  Creatinine 0.61 - 1.24 mg/dL 1.87(H) 1.89(H) 2.01(H)  Sodium 135 - 145 mmol/L 145 144 144  Potassium 3.5 - 5.1 mmol/L 4.6 5.4(H) 5.4(H)  Chloride 98 - 111 mmol/L 105 105 106  CO2 22 - 32 mmol/L _0 Calcium 8.9 - 10.3 mg/dL 8.4(L) 8.5(L) 8.5(L)    SIGNIFICANT EVENTS 8/29 admitted for severe resp failure CHF  exacerbation 8/30 extubated 8/31 re-intubated, PICC line placed 9/2 severe hypoxia 9/3 RT hemithorax opacification plan for bronch, CVl placed, severe hypoxia 9/5 severe hypoxia  REVIEW OF SYSTEMS  [name] UNABLE TO PROVIDE COMPLETE REVIEW OF SYSTEMS DUE TO SEVERE CRITICAL ILLNESS   PHYSICAL EXAMINATION:  GENERAL:critically ill appearing, +resp distress HEAD: Normocephalic, atraumatic.  EYES: Pupils equal, round, reactive to light.  No scleral icterus.  MOUTH: Moist mucosal membrane. NECK: Supple.  PULMONARY: +rhonchi, +wheezing CARDIOVASCULAR: S1 and S2. Regular rate and rhythm. No murmurs, rubs, or gallops.  GASTROINTESTINAL: Soft, nontender, -distended. No masses. Positive bowel sounds. No hepatosplenomegaly.  MUSCULOSKELETAL: No swelling, clubbing, or edema.  NEUROLOGIC: obtunded, GCS<8 SKIN:intact,warm,dry  MEDICATIONS: I have reviewed all medications and confirmed regimen as documented   CULTURE RESULTS   Recent Results (from the past 240 hour(s))  SARS CORONAVIRUS 2 (TAT 6-12 HRS) Nasal Swab Aptima Multi Swab     Status: None   Collection Time: 10/26/2018  4:55 PM   Specimen: Aptima Multi Swab; Nasal Swab  Result Value Ref Range Status   SARS Coronavirus 2 NEGATIVE NEGATIVE Final    Comment: (NOTE) SARS-CoV-2 target nucleic acids are NOT DETECTED. The SARS-CoV-2 RNA is generally detectable in upper and lower respiratory specimens during the acute phase of infection.  Negative results do not preclude SARS-CoV-2 infection, do not rule out co-infections with other pathogens, and should not be used as the sole basis for treatment or other patient management decisions. Negative results must be combined with clinical observations, patient history, and epidemiological information. The expected result is Negative. Fact Sheet for Patients: SugarRoll.be Fact Sheet for Healthcare Providers: https://www.woods-mathews.com/ This test  is not yet approved or cleared by the [place] FDA and  has been authorized for detection and/or diagnosis of SARS-CoV-2 by FDA under an Emergency Use Authorization (EUA). This EUA will remain  in effect (meaning this test can be used) for the duration of the COVID-19 declaration under Section 56 4(b)(1) of the Act, 21 U.S.C. section 360bbb-3(b)(1), unless the authorization is terminated or revoked sooner. Performed at [facility], [place] [address], [place], [place] [address]   SARS Coronavirus 2 [facility] order, Performed in [facility] hospital lab) Nasopharyngeal Nasopharyngeal Swab     Status: None   Collection Time: 10/28/2018 10:34 PM   Specimen: Nasopharyngeal Swab  Result Value Ref Range Status   SARS Coronavirus 2 NEGATIVE NEGATIVE Final    Comment: (NOTE) If result is NEGATIVE SARS-CoV-2 target nucleic acids are NOT DETECTED. The SARS-CoV-2 RNA is generally detectable in upper and lower  respiratory specimens during the acute phase of infection. The lowest  concentration of SARS-CoV-2 viral copies this assay can detect is 250  copies / mL. A negative result does not preclude SARS-CoV-2 infection  and should not be used as the sole basis for treatment or other  patient management decisions.  A negative result may occur with  improper specimen collection / handling, submission of specimen other  than nasopharyngeal swab, presence of viral mutation(s) within the  areas targeted by this assay, and inadequate number of viral copies  (<250 copies / mL). A negative result must be combined with clinical  observations, patient history, and epidemiological information. If result is POSITIVE SARS-CoV-2 target nucleic acids are DETECTED. The SARS-CoV-2 RNA is generally detectable in upper and lower  respiratory specimens dur ing the acute phase of infection.  Positive  results are indicative of active infection with SARS-CoV-2.  Clinical  correlation with patient history and  other diagnostic information is  necessary to determine patient infection status.  Positive results do  not rule out bacterial infection or co-infection with other viruses. If result is PRESUMPTIVE POSTIVE SARS-CoV-2 nucleic acids MAY BE PRESENT.   A presumptive positive result was obtained on the submitted specimen  and confirmed on repeat testing.  While 2019 novel coronavirus  (SARS-CoV-2) nucleic acids may be present in the submitted sample  additional confirmatory testing may be necessary for epidemiological  and / or clinical management purposes  to differentiate between  SARS-CoV-2 and other Sarbecovirus currently known to infect humans.  If clinically indicated additional testing with an alternate test  methodology [phone]) is advised. The SARS-CoV-2 RNA is generally  detectable in upper and lower respiratory sp ecimens during the acute  phase of infection. The expected result is Negative. Fact Sheet for Patients:  StrictlyIdeas.no Fact Sheet for Healthcare Providers: BankingDealers.co.za This test is not yet approved or cleared by the [place] FDA and has been authorized for detection and/or diagnosis of SARS-CoV-2 by FDA under an Emergency Use Authorization (EUA).  This EUA will remain in effect (meaning this test can be used) for the duration of the COVID-19 declaration under Section 564(b)(1) of the Act, 21 U.S.C. section 360bbb-3(b)(1), unless the authorization is  terminated or revoked sooner. Performed at [facility], [address] [place], [place] [address]   Culture, respiratory (non-expectorated)     Status: None   Collection Time: 11/10/2018 10:34 PM   Specimen: Tracheal Aspirate; Respiratory  Result Value Ref Range Status   Specimen Description   Final    TRACHEAL ASPIRATE Performed at [facility], [address] [place], [place] [address]    Special Requests   Final    NONE Performed  at [facility], [address] [place], [place] [address]    Gram Stain   Final    RARE WBC PRESENT, PREDOMINANTLY PMN ABUNDANT GRAM POSITIVE COCCI IN CHAINS    Culture   Final    Consistent with normal respiratory flora. Performed at [facility], [place] [address] [place], [place] [address]    Report Status 11/19/2018 FINAL  Final  Urine Culture     Status: None   Collection Time: 10/29/2018 11:12 PM   Specimen: Urine, Random  Result Value Ref Range Status   Specimen Description   Final    URINE, RANDOM Performed at [facility], [address] [place], [place] [address]    Special Requests   Final    NONE Performed at [facility], [address], [place], [place] [address]    Culture   Final    NO GROWTH Performed at [facility], [place] [address], [place], [place] [address]    Report Status 11/18/2018 FINAL  Final  MRSA PCR Screening     Status: None   Collection Time: 11/14/2018 11:28 PM   Specimen: Nasopharyngeal  Result Value Ref Range Status   MRSA by PCR NEGATIVE NEGATIVE Final    Comment:        The GeneXpert MRSA Assay (FDA approved for NASAL specimens only), is one component of a comprehensive MRSA colonization surveillance program. It is not intended to diagnose MRSA infection nor to guide or monitor treatment for MRSA infections. Performed at [facility], [address] [place], [place] [address]   Culture, blood (Routine X 2) w Reflex to ID Panel     Status: None   Collection Time: 11/17/18 12:19 AM   Specimen: BLOOD  Result Value Ref Range Status   Specimen Description BLOOD RIGHT ANTECUBITAL  Final   Special Requests   Final    BOTTLES DRAWN AEROBIC AND ANAEROBIC Blood Culture adequate volume   Culture   Final    NO GROWTH 5 DAYS Performed at [facility], [address], [place], [place] [address]    Report Status 11/22/2018 FINAL  Final  Culture, blood (Routine X 2) w Reflex to ID  Panel     Status: None   Collection Time: 11/17/18 12:31 AM   Specimen: BLOOD  Result Value Ref Range Status   Specimen Description BLOOD CENTRAL LINE MIDLINE  Final   Special Requests   Final    BOTTLES DRAWN AEROBIC AND ANAEROBIC Blood Culture adequate volume   Culture   Final    NO GROWTH 5 DAYS Performed at [facility], [address], [place], [place] [address]    Report Status 11/22/2018 FINAL  Final  Culture, bal-quantitative     Status: Abnormal (Preliminary result)   Collection Time: 11/22/18  7:48 AM   Specimen: Bronchoalveolar Lavage; Respiratory  Result Value Ref Range Status   Specimen Description   Final    BRONCHIAL ALVEOLAR LAVAGE Performed at [facility], [address], [place], [place] [address]    Special Requests   Final    Immunocompromised Performed at [facility], [place], [place] [address]    Gram Stain   Final    FEW WBC PRESENT, PREDOMINANTLY PMN FEW GRAM POSITIVE COCCI IN PAIRS IN CLUSTERS FEW GRAM POSITIVE RODS    Culture (A)  Final    >=100,000 COLONIES/mL STAPHYLOCOCCUS SIMULANS >=100,000 COLONIES/mL VIRIDANS STREPTOCOCCUS CULTURE REINCUBATED FOR BETTER GROWTH Performed at [facility], [place] [address] [place], [place] [address]    Report Status PENDING  Incomplete          IMAGING    Dg Chest Port 1 View  Result Date: 11/24/2018 CLINICAL DATA:  Shortness of breath EXAM: PORTABLE CHEST 1 VIEW COMPARISON:  November 22, 2018 FINDINGS: Endotracheal tube tip is seen 3.5 cm above the level of carina. NG tube is seen coursing below the diaphragm. A right-sided central venous catheter is seen within the mid SVC./slight interval improved aeration since prior exam. Probable subsegmental atelectasis remains at the lung bases. There is a retrocardiac opacity. IMPRESSION: Slight interval improvement in aeration. However there remains a retrocardiac opacity which could be atelectasis and/or  infectious etiology. Electronically Signed   By: Prudencio Pair M.D.   On: 11/24/2018 03:51       Indwelling Urinary Catheter continued, requirement due to   Reason to continue Indwelling Urinary Catheter strict Intake/Output monitoring for hemodynamic instability   Central Line/ continued, requirement due to  Reason to continue [place] of central venous pressure or other hemodynamic parameters and poor IV access   Ventilator continued, requirement due to severe respiratory failure   Ventilator Sedation RASS 0 to -2      ASSESSMENT AND PLAN SYNOPSIS  Severe ACUTE Hypoxic and Hypercapnic Respiratory Failurefrom pulm edema with effusions and progressiove renal failure with OHS and OSA with morbid obesity with pneumonia/ARDS   Severe ACUTE Hypoxic and Hypercapnic Respiratory Failure -continue Full MV support -continue Bronchodilator Therapy -Wean Fio2 and PEEP as tolerated Unable to wean from vent  ACUTE SYSTOLIC CARDIAC FAILURE- HFpEF -oxygen as needed -Lasix as tolerated -follow up cardiac enzymes as indicated   ACUTE KIDNEY INJURY/Renal Failure -follow chem 7 -follow UO -continue Foley Catheter-assess need -Avoid nephrotoxic agents -Recheck creatinine    NEUROLOGY - intubated and sedated - minimal sedation to achieve a RASS goal: -1   CARDIAC ICU monitoring  ID -continue IV abx as prescibed -follow up cultures therapy for pneumonia  GI GI PROPHYLAXIS as indicated  NUTRITIONAL STATUS DIET-->TF's as tolerated Constipation protocol as indicated  ENDO - will use ICU hypoglycemic\Hyperglycemia protocol if indicated   ELECTROLYTES -follow labs as needed -replace as needed -pharmacy consultation and following   DVT/GI PRX ordered TRANSFUSIONS AS NEEDED MONITOR FSBS ASSESS the need for LABS as needed   Critical Care Time devoted to patient care services described in this note is 35 minutes.   Overall, patient is critically ill,  prognosis is guarded.  Patient with Multiorgan failure and at high risk for cardiac arrest and death.   [name] DNR, patient will likely not survive this admission  Corrin Parker, M.D.  [facility]  Medical Director [place] Director [facility] Cardio-Pulmonary Department

## 2018-11-24 NOTE — Progress Notes (Signed)
Patient seemed in pain and agitated this morning, medicines titrated for comfort.  Then patient tried to extubate himself and pull his tubes out.  Medications titrated again. Patient then stopped pulling his tubes.  RN spoke with his girlfriend on the phone.  She said his sister makes his medical decisions.  His sister will visit later today.  Phillis Knack, RN

## 2018-11-24 NOTE — Consult Note (Signed)
ANTICOAGULATION CONSULT NOTE  Pharmacy Consult for Heparin Drip Indication: atrial fibrillation  No Known Allergies  Patient Measurements: Height: 5' 9.02" (175.3 cm) Weight: (!) 457 lb (207.3 kg) IBW/kg (Calculated) : 70.74 Heparin Dosing Weight: 123 kg  Vital Signs: Temp: 100.5 F (38.1 C) (09/05 0358) Temp Source: Oral (09/05 0358) BP: 113/87 (09/05 0358) Pulse Rate: 38 (09/05 0400)  Labs: Recent Labs    11/22/18 0429 11/22/18 2205 11/23/18 0011 11/23/18 0447 11/23/18 1320 11/23/18 1822 11/24/18 0329  HGB 14.9  --   --  13.8  --   --   --   HCT 46.4  --   --  44.9  --   --   --   PLT 156  --   --  140*  --   --   --   APTT  --   --   --   --  >160*  --   --   LABPROT 29.5*  --   --  20.4*  --   --   --   INR 2.9*  --   --  1.8*  --   --   --   HEPARINUNFRC  --   --   --   --   --  1.17* 0.79*  CREATININE 1.51*  --   --  2.01* 1.89*  --   --   TROPONINIHS  --  34* 38*  --   --   --   --     Estimated Creatinine Clearance: 74.6 mL/min (A) (by C-G formula based on SCr of 1.89 mg/dL (H)).   Medical History: Past Medical History:  Diagnosis Date  . Atrial fibrillation ([place])   . CHF (congestive heart failure) ([place])   . Diabetes mellitus without complication ([place])   . Gout   . Hypertension associated with type 2 diabetes mellitus ([place])   . Morbid obesity ([place])   . Obstructive sleep apnea     Medications:  Medications Prior to Admission  Medication Sig Dispense Refill Last Dose  . aspirin EC 81 MG tablet Take 81 mg by mouth daily.   11/15/2018 at Unknown time  . colchicine 0.6 MG tablet Take 12 mg by mouth. At first sing of gout flare   prn at prn  . diltiazem (CARDIZEM CD) 240 MG 24 hr capsule Take 240 mg by mouth daily.   11/15/2018 at Unknown time  . furosemide (LASIX) 40 MG tablet Take 40 mg by mouth 2 (two) times daily.   11/15/2018 at Unknown time  . lisinopril (ZESTRIL) 5 MG tablet Take 5 mg by mouth daily.   11/15/2018 at Unknown time  . metFORMIN  (GLUCOPHAGE) 500 MG tablet Take 500 mg by mouth 2 (two) times daily with a meal.   11/15/2018 at Unknown time  . metoprolol tartrate (LOPRESSOR) 100 MG tablet Take 50 mg by mouth 2 (two) times daily.   11/15/2018 at Unknown time  . pravastatin (PRAVACHOL) 80 MG tablet Take 80 mg by mouth every evening.   11/15/2018 at Unknown time  . warfarin (COUMADIN) 4 MG tablet Take 2.5-4 mg by mouth daily. Take 4 mg on Sunday, Tuesday, Wednesday, Thursday, and Saturday. Take 2.5 mg on Monday and Friday   11/15/2018 at Unknown time   Scheduled:  . allopurinol  100 mg Per Tube Daily  . ALPRAZolam  0.5 mg Per Tube BID  . aspirin  81 mg Per NG tube Daily  . chlorhexidine gluconate (MEDLINE KIT)  15 mL Mouth Rinse  BID  . Chlorhexidine Gluconate Cloth  6 each Topical Daily  . feeding supplement (PRO-STAT SUGAR FREE 64)  60 mL Per Tube TID  . insulin aspart  0-15 Units Subcutaneous Q4H  . insulin aspart  4 Units Subcutaneous Q4H  . insulin detemir  10 Units Subcutaneous Daily  . ipratropium-albuterol  3 mL Nebulization Q4H  . mouth rinse  15 mL Mouth Rinse 10 times per day  . methylPREDNISolone (SOLU-MEDROL) injection  40 mg Intravenous BID  . metoprolol tartrate  50 mg Per NG tube BID  . multivitamin  15 mL Per Tube Daily  . pantoprazole sodium  40 mg Per Tube QHS  . potassium chloride  40 mEq Per Tube Once  . pravastatin  80 mg Per NG tube QPM  . QUEtiapine  25 mg Per Tube QHS  . senna-docusate  2 tablet Oral BID  . sodium chloride flush  10-40 mL Intracatheter Q12H   Infusions:  . amiodarone 30 mg/hr (11/24/18 0316)  . ceFEPime (MAXIPIME) IV Stopped (11/23/18 2212)  . dexmedetomidine (PRECEDEX) IV infusion 0.399 mcg/kg/hr (11/24/18 0316)  . diltiazem (CARDIZEM) infusion Stopped (11/21/18 0201)  . feeding supplement (VITAL HIGH PROTEIN) 1,000 mL (11/23/18 1624)  . heparin 1,400 Units/hr (11/24/18 0316)  . HYDROmorphone 2 mg/hr (11/24/18 0316)  . midazolam 4 mg/hr (11/24/18 0316)  . norepinephrine  (LEVOPHED) Adult infusion 2 mcg/min (11/24/18 0316)  . phenylephrine (NEO-SYNEPHRINE) Adult infusion Stopped (11/23/18 0241)   PRN: acetaminophen, bisacodyl, metoprolol tartrate, midazolam, ondansetron (ZOFRAN) IV, sennosides, sodium chloride flush, sodium chloride flush, vecuronium Anti-infectives (From admission, onward)   Start     Dose/Rate Route Frequency Ordered Stop   11/22/18 1015  ceFEPIme (MAXIPIME) 2 g in sodium chloride 0.9 % 100 mL IVPB     2 g 200 mL/hr over 30 Minutes Intravenous Every 8 hours 11/22/18 1009        Assessment: Pharmacy consulted to initiate Heparin Drip on 58yo patient admitted with acute exacerbation of CHF. Patient diagnosed with nonvalvular atrial fibrillation with acute onset of severe shortness of breath, weakness and fatigue, and hypoxia over the past week. Patient takes warfarin PTA with schedule being 41m SuTWThSa, and 2.549mMF which was restarted in-patient. INR level on 8/30 was 3.4. [name] Hgb is stable. Will transition from Warfarin to Heparin while in-patient.  Date INR Dose 8/28 3  8/29 3.3 4 mg 8/30 3.4 8/31 3.6 9/1 3.8 9/2 3.6 9/3 2.9 9/4 1.8  Heparin 9/4 Heparin infusion started @ 1750 units/hr  Goal of Therapy:  Heparin level 0.3-0.7 units/ml Monitor platelets by anticoagulation protocol: Yes   Plan:  INR now less than 2. Per discussion on rounds, will start heparin drip. Concern with warfarin being a safe option for patient with rather significant fluctuation in INR. Patient remains critically ill. Renal function has declined.   9/4 @ 1822 HL: 1.17. Level is supratherapeutic. Will hold heparin infusion 1 hour and restart @ reduced infusion rate of 1400 units/hr. 9/4 @ 0329 HL = 0.79, supratherapeutic >> will reduce heparin infusion to 1300 units/hr Recheck heparin level 6 hours after infusion rate change.  CBC with morning labs.  HaEna DawleyPharmD Clinical Pharmacist 11/24/2018 4:16 AM

## 2018-11-24 NOTE — Consult Note (Signed)
Pharmacy Antibiotic Note  Kyle White is a 58 y.o. male admitted on 11/17/2018 with pneumonia and sepsis.  Pharmacy has been consulted for cefepime dosing. Pt still spiking fevers. F/u with with sensitivities.   Plan: Day 3. Cefepime 2 g q8H.   Height: 5' 9.02" (175.3 cm) Weight: (unable to complate-scale not working) IBW/kg (Calculated) : 70.74  Temp (24hrs), Avg:101.1 F (38.4 C), Min:99.5 F (37.5 C), Max:102.7 F (39.3 C)  Recent Labs  Lab 11/20/18 0416 11/21/18 0346 11/22/18 0429 11/23/18 0447 11/23/18 1320 11/24/18 0410  WBC 6.2 6.5 7.2 7.5  --  10.3  CREATININE 1.28* 1.58* 1.51* 2.01* 1.89* 1.87*    Estimated Creatinine Clearance: 75.4 mL/min (A) (by C-G formula based on SCr of 1.87 mg/dL (H)).    No Known Allergies  Antimicrobials this admission: 9/3 cefepime >>   Microbiology results: Bronchoalveolar Lavage; Respiratory positive for staph simulans and viridans streptococcus.   Thank you for allowing pharmacy to be a part of this patient's care.  Oswald Hillock 11/24/2018 12:29 PM

## 2018-11-24 NOTE — Progress Notes (Signed)
Kyle White at [place] NAME: Kyle White    MR#:  [id number]  DATE OF BIRTH:  1960-04-13  SUBJECTIVE:  patient remains intubated on the ventilator on 100% Fio2, PEEP 15, severely hypoxemic ,unsuccessful weaning trials underwent bronchoscopy 11/22/18  Clinically deteriorating  REVIEW OF SYSTEMS:   Review of Systems  Unable to perform ROS: Intubated   DRUG ALLERGIES:  No Known Allergies  VITALS:  Blood pressure (!) 87/74, pulse 72, temperature 99.5 F (37.5 C), temperature source Axillary, resp. rate 20, height 5' 9.02" (1.753 m), weight (!) 207.3 kg, SpO2 93 %.  PHYSICAL EXAMINATION:   Physical Exam  GENERAL:  58 y.o.-year-old patient lying in the bed with no acute distress. Morbidly obese critically ill intubated on the vent EYES: Pupils equal, round, reactive to light and accommodation. No scleral icterus. Extraocular muscles intact.  HEENT: Head atraumatic, normocephalic. Oropharynx and nasopharynx clear.  NECK:  Supple, no jugular venous distention. No thyroid enlargement, no tenderness.  LUNGS: Mod breath sounds bilaterally, positive wheezing, rales, rhonchi. No use of accessory muscles of respiration.  CARDIOVASCULAR: S1, S2 normal. No murmurs, rubs, or gallops.  ABDOMEN: Soft, nontender, nondistended. Bowel sounds present. No organomegaly or mass.  EXTREMITIES: No cyanosis, clubbing or edema b/l.    NEUROLOGIC: sedated GCS less than 8 PSYCHIATRIC: sedated  And on the vent SKIN: No obvious rash, lesion, or ulcer.   LABORATORY PANEL:  CBC Recent Labs  Lab 11/24/18 0410  WBC 10.3  HGB 14.9  HCT 47.2  PLT 139*    Chemistries  Recent Labs  Lab 11/24/18 0410  NA 145  K 4.6  CL 105  CO2 28  GLUCOSE 276*  BUN 77*  CREATININE 1.87*  CALCIUM 8.4*  MG 2.7*   Cardiac Enzymes No results for input(s): TROPONINI in the last 168 hours. RADIOLOGY:  Dg Chest Port 1 View  Result Date: 11/24/2018 CLINICAL DATA:   Shortness of breath EXAM: PORTABLE CHEST 1 VIEW COMPARISON:  November 22, 2018 FINDINGS: Endotracheal tube tip is seen 3.5 cm above the level of carina. NG tube is seen coursing below the diaphragm. A right-sided central venous catheter is seen within the mid SVC./slight interval improved aeration since prior exam. Probable subsegmental atelectasis remains at the lung bases. There is a retrocardiac opacity. IMPRESSION: Slight interval improvement in aeration. However there remains a retrocardiac opacity which could be atelectasis and/or infectious etiology. Electronically Signed   By: Kyle White M.D.   On: 11/24/2018 03:51   Dg Chest Port 1 View  Result Date: 11/22/2018 CLINICAL DATA:  Ventilator support.  Follow-up. EXAM: PORTABLE CHEST 1 VIEW COMPARISON:  Earlier same day FINDINGS: Endotracheal tube tip is 3 cm above the carina. Orogastric or nasogastric tube enters the abdomen. Right internal jugular central line tip in the SVC above the right atrium. Persistent bilateral lower lung atelectasis and or pneumonia. No worsening or new finding. IMPRESSION: No apparent change since earlier today. Lines and tubes well position. Persistent atelectasis and or pneumonia in the mid and lower lungs. Electronically Signed   By: Kyle White M.D.   On: 11/22/2018 21:23   Dg Chest Port 1 View  Result Date: 11/22/2018 CLINICAL DATA:  Central line placement EXAM: PORTABLE CHEST 1 VIEW COMPARISON:  11/22/2018 FINDINGS: [name] rotated. Interval placement of a right IJ central venous catheter with distal tip terminating at the expected level of the superior cavoatrial junction. ET tube remains in place with distal tip terminating 3.8  cm superior to the carina. Enteric tube courses below the diaphragm with distal tip beyond the inferior margin of the film. Multiple overlying external leads. Cardiac silhouette is largely obscured but appears enlarged. Bilateral pleural effusions. Volume loss within the right lung. Slight  improved aeration of the right lung compared to prior. No pneumothorax visualized. IMPRESSION: 1. Interval placement of right IJ central venous catheter. No pneumothorax. 2. Improving aeration of the right lung. Electronically Signed   By: Davina Poke M.D.   On: 11/22/2018 14:26   ASSESSMENT AND PLAN:  Kyle White  is a 58 y.o. male with a known history of atrial fibrillation on Coumadin, CHF, diabetes mellitus, and obstructive sleep apnea with CPAP use at home, morbid obesity with sedentary lifestyle on oxygen at 2 L/min at home.  1.Acute on chronic  respiratory failure with severe hypoxia-ARDS with pneumonia -Clinically not improving - Extubated on 8/31 but required reintubation, vent management per PCCM team.  Unsuccessful weaning trials -patient underwent bronchoscopy 11/22/2018. 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 -full vent support with hundred percent of FI02, PEEP 15 --- extremely poor prognosis with high mortality and morbidity  2.Obstructive sleep apnea-- chronic  3. Acute on chronic diastolic CHF, EF 55 to 123456 on echo in 2014 at [facility] -Repeat echocardiogram shows EF of 55 to 60%  4. History of atrial fibrillation -Continue Coumadin - INR 3.8  5. Acute renal failure on chronic kidney disease stage III  with baseline creatinine 1.28-1.89-1.87 -Repeat BMP in the a.m. and continue to monitor renal function closely -Nephrology consult placed and Dr. Zollie White is following -No indication for hemodialysis at this point -Renal ultrasound  6.  Type IIDiabetes mellitus -Sliding White insulin -Hemoglobin A1c 7.3  7. Generalized weakness -Physical therapy eval once extubated  8.  Abdominal distention -No acute pathology on CT scan or KUB performed  -Could be ileus, NG tube in place   Overall poor prognosis. Appreciate palliative care input CODE STATUS: DNR  DVT Prophylaxis: Lovenox  TOTAL TIME TAKING CARE OF THIS PATIENT:33 minutes.  >50% time  spent on counselling and coordination of care  POSSIBLE D/C IN *?* DAYS, DEPENDING ON CLINICAL CONDITION.  Note: This dictation was prepared with Dragon dictation along with smaller phrase technology. Any transcriptional errors that result from this process are unintentional. Plan of care discussed with the patient's girlfriend at bedside  Nicholes Mango M.D on 11/24/2018 at 1:37 PM  Between 7am to 6pm - Pager - [phone]  After 6pm go to [email or web address] - password [email or web address] [facility]  Office  [phone]  CC: Primary care physician; Inc, [place] ServicesPatient ID: Kyle White, male   DOB: 02-15-1961, 58 y.o.   MRN: [id number]

## 2018-11-24 NOTE — Progress Notes (Signed)
ANTICOAGULATION CONSULT NOTE - Initial Consult  Pharmacy Consult for Heparin  Indication: atrial fibrillation  No Known Allergies  Patient Measurements: Height: 5' 9.02" (175.3 cm) Weight: (unable to complate-scale not working) IBW/kg (Calculated) : 70.74 Heparin Dosing Weight: 123 kg   Vital Signs: Temp: 102.8 F (39.3 C) (09/05 1934) Temp Source: Oral (09/05 1934) BP: 134/103 (09/05 1934) Pulse Rate: 48 (09/05 1934)  Labs: Recent Labs    11/22/18 0429 11/22/18 2205 11/23/18 0011 11/23/18 0447 11/23/18 1320  11/24/18 0329 11/24/18 0410 11/24/18 1115 11/24/18 1810  HGB 14.9  --   --  13.8  --   --   --  14.9  --   --   HCT 46.4  --   --  44.9  --   --   --  47.2  --   --   PLT 156  --   --  140*  --   --   --  139*  --   --   APTT  --   --   --   --  >160*  --   --   --   --   --   LABPROT 29.5*  --   --  20.4*  --   --   --   --   --   --   INR 2.9*  --   --  1.8*  --   --   --   --   --   --   HEPARINUNFRC  --   --   --   --   --    < > 0.79*  --  0.91* 0.75*  CREATININE 1.51*  --   --  2.01* 1.89*  --   --  1.87*  --   --   TROPONINIHS  --  34* 38*  --   --   --   --   --   --   --    < > = values in this interval not displayed.    Estimated Creatinine Clearance: 75.4 mL/min (A) (by C-G formula based on SCr of 1.87 mg/dL (H)).   Medical History: Past Medical History:  Diagnosis Date  . Atrial fibrillation ([place])   . CHF (congestive heart failure) ([place])   . Diabetes mellitus without complication ([place])   . Gout   . Hypertension associated with type 2 diabetes mellitus ([place])   . Morbid obesity ([place])   . Obstructive sleep apnea     Medications:  Medications Prior to Admission  Medication Sig Dispense Refill Last Dose  . aspirin EC 81 MG tablet Take 81 mg by mouth daily.   11/15/2018 at Unknown time  . colchicine 0.6 MG tablet Take 12 mg by mouth. At first sing of gout flare   prn at prn  . diltiazem (CARDIZEM CD) 240 MG 24 hr capsule Take 240 mg by mouth  daily.   11/15/2018 at Unknown time  . furosemide (LASIX) 40 MG tablet Take 40 mg by mouth 2 (two) times daily.   11/15/2018 at Unknown time  . lisinopril (ZESTRIL) 5 MG tablet Take 5 mg by mouth daily.   11/15/2018 at Unknown time  . metFORMIN (GLUCOPHAGE) 500 MG tablet Take 500 mg by mouth 2 (two) times daily with a meal.   11/15/2018 at Unknown time  . metoprolol tartrate (LOPRESSOR) 100 MG tablet Take 50 mg by mouth 2 (two) times daily.   11/15/2018 at Unknown time  . pravastatin (PRAVACHOL) 80 MG  tablet Take 80 mg by mouth every evening.   11/15/2018 at Unknown time  . warfarin (COUMADIN) 4 MG tablet Take 2.5-4 mg by mouth daily. Take 4 mg on Sunday, Tuesday, Wednesday, Thursday, and Saturday. Take 2.5 mg on Monday and Friday   11/15/2018 at Unknown time    Assessment: Pharmacy consulted to initiate Heparin Drip on 57yo patient admitted with acute exacerbation of CHF. Patient diagnosed with nonvalvular atrial fibrillation with acute onset of severe shortness of breath, weakness and fatigue, and hypoxia over the past week. Patient takes warfarin PTA with schedule being 4mg  SuTWThSa, and 2.5mg  MF which was restarted in-patient. INR level on 8/30 was 3.4. [name] Hgb is stable. Will transition from Warfarin to Heparin while in-patient.  Date    INR      Dose 8/28     3           8/29     3.3       4 mg 8/30     3.4 8/31     3.6 9/1       3.8 9/2       3.6 9/3       2.9 9/4       1.8  Heparin 9/4 Heparin infusion started @ 1750 units/hr 9/4 @ 1822 HL: 1.17. Level is supratherapeutic. Will hold heparin infusion 1 hour and restart @ reduced infusion rate of 1400 units/hr. 9/4 @ 0329 HL = 0.79, supratherapeutic >> will reduce heparin infusion to 1300 units/hr  Goal of Therapy:  Heparin level 0.3-0.7 units/ml Monitor platelets by anticoagulation protocol: Yes   Plan:  INR now less than 2. Per discussion on rounds, will start heparin drip. Concern with warfarin being a safe option for  patient with rather significant fluctuation in INR. Patient remains critically ill. Renal function has declined.   9/5 @ 1115 HL 0.91. Level supratherapeutic. Will decrease infusion rate to 1100 units/hr.  Recheck heparin level 6 hours after infusion rate change.   9/5: HL @ 1800 = 0.75 Will decrease heparin gtt to 1000 units/hr and recheck HL 6 hrs after rate change.   CBC with morning labs. Kyle White D 11/24/2018,8:08 PM

## 2018-11-24 NOTE — Consult Note (Signed)
ANTICOAGULATION CONSULT NOTE  Pharmacy Consult for Heparin Drip Indication: atrial fibrillation  No Known Allergies  Patient Measurements: Height: 5' 9.02" (175.3 cm) Weight: (unable to complate-scale not working) IBW/kg (Calculated) : 70.74 Heparin Dosing Weight: 123 kg  Vital Signs: Temp: 99.5 F (37.5 C) (09/05 0800) Temp Source: Axillary (09/05 0800) BP: 87/74 (09/05 1000) Pulse Rate: 72 (09/05 1000)  Labs: Recent Labs    11/22/18 0429 11/22/18 2205 11/23/18 0011 11/23/18 0447 11/23/18 1320 11/23/18 1822 11/24/18 0329 11/24/18 0410 11/24/18 1115  HGB 14.9  --   --  13.8  --   --   --  14.9  --   HCT 46.4  --   --  44.9  --   --   --  47.2  --   PLT 156  --   --  140*  --   --   --  139*  --   APTT  --   --   --   --  >160*  --   --   --   --   LABPROT 29.5*  --   --  20.4*  --   --   --   --   --   INR 2.9*  --   --  1.8*  --   --   --   --   --   HEPARINUNFRC  --   --   --   --   --  1.17* 0.79*  --  0.91*  CREATININE 1.51*  --   --  2.01* 1.89*  --   --  1.87*  --   TROPONINIHS  --  34* 38*  --   --   --   --   --   --     Estimated Creatinine Clearance: 75.4 mL/min (A) (by C-G formula based on SCr of 1.87 mg/dL (H)).   Medical History: Past Medical History:  Diagnosis Date  . Atrial fibrillation ([place])   . CHF (congestive heart failure) ([place])   . Diabetes mellitus without complication ([place])   . Gout   . Hypertension associated with type 2 diabetes mellitus ([place])   . Morbid obesity ([place])   . Obstructive sleep apnea     Medications:  Medications Prior to Admission  Medication Sig Dispense Refill Last Dose  . aspirin EC 81 MG tablet Take 81 mg by mouth daily.   11/15/2018 at Unknown time  . colchicine 0.6 MG tablet Take 12 mg by mouth. At first sing of gout flare   prn at prn  . diltiazem (CARDIZEM CD) 240 MG 24 hr capsule Take 240 mg by mouth daily.   11/15/2018 at Unknown time  . furosemide (LASIX) 40 MG tablet Take 40 mg by mouth 2 (two) times  daily.   11/15/2018 at Unknown time  . lisinopril (ZESTRIL) 5 MG tablet Take 5 mg by mouth daily.   11/15/2018 at Unknown time  . metFORMIN (GLUCOPHAGE) 500 MG tablet Take 500 mg by mouth 2 (two) times daily with a meal.   11/15/2018 at Unknown time  . metoprolol tartrate (LOPRESSOR) 100 MG tablet Take 50 mg by mouth 2 (two) times daily.   11/15/2018 at Unknown time  . pravastatin (PRAVACHOL) 80 MG tablet Take 80 mg by mouth every evening.   11/15/2018 at Unknown time  . warfarin (COUMADIN) 4 MG tablet Take 2.5-4 mg by mouth daily. Take 4 mg on Sunday, Tuesday, Wednesday, Thursday, and Saturday. Take 2.5 mg on Monday and Friday  11/15/2018 at Unknown time   Scheduled:  . allopurinol  100 mg Per Tube Daily  . ALPRAZolam  0.5 mg Per Tube BID  . aspirin  81 mg Per NG tube Daily  . chlorhexidine gluconate (MEDLINE KIT)  15 mL Mouth Rinse BID  . Chlorhexidine Gluconate Cloth  6 each Topical Daily  . feeding supplement (PRO-STAT SUGAR FREE 64)  60 mL Per Tube TID  . insulin aspart  0-15 Units Subcutaneous Q4H  . insulin aspart  4 Units Subcutaneous Q4H  . insulin detemir  10 Units Subcutaneous Daily  . ipratropium-albuterol  3 mL Nebulization Q6H  . mouth rinse  15 mL Mouth Rinse 10 times per day  . methylPREDNISolone (SOLU-MEDROL) injection  40 mg Intravenous BID  . metoprolol tartrate  50 mg Per NG tube BID  . multivitamin  15 mL Per Tube Daily  . pantoprazole sodium  40 mg Per Tube QHS  . potassium chloride  40 mEq Per Tube Once  . pravastatin  80 mg Per NG tube QPM  . QUEtiapine  25 mg Per Tube QHS  . senna-docusate  2 tablet Oral BID  . sodium chloride flush  10-40 mL Intracatheter Q12H   Infusions:  . amiodarone 60 mg/hr (11/24/18 0958)  . ceFEPime (MAXIPIME) IV Stopped (11/24/18 0601)  . dexmedetomidine (PRECEDEX) IV infusion 1 mcg/kg/hr (11/24/18 1154)  . diltiazem (CARDIZEM) infusion Stopped (11/21/18 0201)  . feeding supplement (VITAL HIGH PROTEIN) Stopped (11/24/18 1152)  . heparin  1,300 Units/hr (11/24/18 0615)  . HYDROmorphone 2 mg/hr (11/24/18 1012)  . midazolam 5 mg/hr (11/24/18 1152)  . norepinephrine (LEVOPHED) Adult infusion Stopped (11/24/18 0333)  . phenylephrine (NEO-SYNEPHRINE) Adult infusion Stopped (11/23/18 0241)   PRN: acetaminophen, bisacodyl, metoprolol tartrate, midazolam, ondansetron (ZOFRAN) IV, sennosides, sodium chloride flush, sodium chloride flush, vecuronium Anti-infectives (From admission, onward)   Start     Dose/Rate Route Frequency Ordered Stop   11/22/18 1015  ceFEPIme (MAXIPIME) 2 g in sodium chloride 0.9 % 100 mL IVPB     2 g 200 mL/hr over 30 Minutes Intravenous Every 8 hours 11/22/18 1009        Assessment: Pharmacy consulted to initiate Heparin Drip on 58yo patient admitted with acute exacerbation of CHF. Patient diagnosed with nonvalvular atrial fibrillation with acute onset of severe shortness of breath, weakness and fatigue, and hypoxia over the past week. Patient takes warfarin PTA with schedule being '4mg'$  SuTWThSa, and 2.'5mg'$  MF which was restarted in-patient. INR level on 8/30 was 3.4. [name] Hgb is stable. Will transition from Warfarin to Heparin while in-patient.  Date INR Dose 8/28 3  8/29 3.3 4 mg 8/30 3.4 8/31 3.6 9/1 3.8 9/2 3.6 9/3 2.9 9/4 1.8  Heparin 9/4 Heparin infusion started @ 1750 units/hr 9/4 @ 1822 HL: 1.17. Level is supratherapeutic. Will hold heparin infusion 1 hour and restart @ reduced infusion rate of 1400 units/hr. 9/4 @ 0329 HL = 0.79, supratherapeutic >> will reduce heparin infusion to 1300 units/hr  Goal of Therapy:  Heparin level 0.3-0.7 units/ml Monitor platelets by anticoagulation protocol: Yes   Plan:  INR now less than 2. Per discussion on rounds, will start heparin drip. Concern with warfarin being a safe option for patient with rather significant fluctuation in INR. Patient remains critically ill. Renal function has declined.   9/5 @ 1115 HL 0.91. Level supratherapeutic. Will  decrease infusion rate to 1100 units/hr.  Recheck heparin level 6 hours after infusion rate change.  CBC with morning labs.  Perlita Forbush  Lorrin Mais, PharmD Clinical Pharmacist 11/24/2018 12:06 PM

## 2018-11-24 NOTE — Progress Notes (Signed)
Pharmacy Electrolyte Monitoring Consult:  Pharmacy consulted to assist in monitoring and replacing electrolytes in this 58 y.o. male admitted on 11/07/2018. Patient admitted to ICU on 8/29 and intubated. Patient with past medical history significant for atrial fibrillation, CHF, diabetes, gout, hypertension, obesity, and sleep apnea.   Labs:  Sodium (mmol/L)  Date Value  11/24/2018 145   Potassium (mmol/L)  Date Value  11/24/2018 4.6   Magnesium (mg/dL)  Date Value  11/24/2018 2.7 (H)   Phosphorus (mg/dL)  Date Value  11/24/2018 3.3   Calcium (mg/dL)  Date Value  11/24/2018 8.4 (L)   Albumin (g/dL)  Date Value  11/17/2018 3.5    Assessment/Plan: Electrolytes elevated this am although patient has not received supplementation for some time. Expect s/t decreased renal function. Potassium has remained the same.  Will replace for goal potassium ~ 4 and goal magnesium ~ 2.  Will obtain BMP with am labs and magnesium/phosphorus as warranted.   Pharmacy will continue to monitor and adjust per consult.   Pernell Dupre, PharmD, BCPS Clinical Pharmacist 11/24/2018 7:33 AM

## 2018-11-25 ENCOUNTER — Inpatient Hospital Stay: Payer: Medicare Other

## 2018-11-25 LAB — CBC
HCT: 46.1 % (ref 39.0–52.0)
HCT: 46.1 % (ref 39.0–52.0)
Hemoglobin: 14.6 g/dL (ref 13.0–17.0)
Hemoglobin: 14.7 g/dL (ref 13.0–17.0)
MCH: 29.4 pg (ref 26.0–34.0)
MCH: 29.7 pg (ref 26.0–34.0)
MCHC: 31.7 g/dL (ref 30.0–36.0)
MCHC: 31.9 g/dL (ref 30.0–36.0)
MCV: 92.9 fL (ref 80.0–100.0)
MCV: 93.1 fL (ref 80.0–100.0)
Platelets: 127 10*3/uL — ABNORMAL LOW (ref 150–400)
Platelets: 124 10*3/uL — ABNORMAL LOW (ref 150–400)
RBC: 4.96 MIL/uL (ref 4.22–5.81)
RBC: 4.95 MIL/uL (ref 4.22–5.81)
RDW: 13.2 % (ref 11.5–15.5)
RDW: 13.3 % (ref 11.5–15.5)
WBC: 8.7 10*3/uL (ref 4.0–10.5)
WBC: 10.2 10*3/uL (ref 4.0–10.5)
nRBC: 0.3 % — ABNORMAL HIGH (ref 0.0–0.2)
nRBC: 0.5 % — ABNORMAL HIGH (ref 0.0–0.2)

## 2018-11-25 LAB — RENAL FUNCTION PANEL
Albumin: 2.6 g/dL — ABNORMAL LOW (ref 3.5–5.0)
Anion gap: 13 (ref 5–15)
BUN: 79 mg/dL — ABNORMAL HIGH (ref 6–20)
CO2: 20 mmol/L — ABNORMAL LOW (ref 22–32)
Calcium: 8.5 mg/dL — ABNORMAL LOW (ref 8.9–10.3)
Chloride: 112 mmol/L — ABNORMAL HIGH (ref 98–111)
Creatinine, Ser: 2.26 mg/dL — ABNORMAL HIGH (ref 0.61–1.24)
GFR calc Af Amer: 35 mL/min — ABNORMAL LOW (ref >60)
GFR calc non Af Amer: 31 mL/min — ABNORMAL LOW (ref >60)
Glucose, Bld: 377 mg/dL — ABNORMAL HIGH (ref 70–99)
Phosphorus: 3.5 mg/dL (ref 2.5–4.6)
Potassium: 4.1 mmol/L (ref 3.5–5.1)
Sodium: 145 mmol/L (ref 135–145)

## 2018-11-25 LAB — CULTURE, BAL-QUANTITATIVE W GRAM STAIN: Culture: >=100000 — AB

## 2018-11-25 LAB — GLUCOSE, CAPILLARY
Glucose-Capillary: 316 mg/dL — ABNORMAL HIGH (ref 70–99)
Glucose-Capillary: 319 mg/dL — ABNORMAL HIGH (ref 70–99)
Glucose-Capillary: 329 mg/dL — ABNORMAL HIGH (ref 70–99)
Glucose-Capillary: 341 mg/dL — ABNORMAL HIGH (ref 70–99)
Glucose-Capillary: 352 mg/dL — ABNORMAL HIGH (ref 70–99)
Glucose-Capillary: 356 mg/dL — ABNORMAL HIGH (ref 70–99)

## 2018-11-25 LAB — HEPARIN LEVEL (UNFRACTIONATED)
Heparin Unfractionated: 0.6 IU/mL (ref 0.30–0.70)
Heparin Unfractionated: 0.66 IU/mL (ref 0.30–0.70)

## 2018-11-25 NOTE — Progress Notes (Signed)
[facility] for Heparin  Indication: atrial fibrillation  No Known Allergies  Patient Measurements: Height: 5' 9.02" (175.3 cm) Weight: (unable to complate-scale not working) IBW/kg (Calculated) : 70.74 Heparin Dosing Weight: 123 kg   Vital Signs: Temp: 100.5 F (38.1 C) (09/06 0400) Temp Source: Axillary (09/06 0400) BP: 107/96 (09/06 0400) Pulse Rate: 77 (09/06 0400)  Labs: Recent Labs    11/22/18 0429 11/22/18 2205 11/23/18 0011 11/23/18 0447 11/23/18 1320  11/24/18 0410 11/24/18 1115 11/24/18 1810 11/25/18 0240  HGB 14.9  --   --  13.8  --   --  14.9  --   --   --   HCT 46.4  --   --  44.9  --   --  47.2  --   --   --   PLT 156  --   --  140*  --   --  139*  --   --   --   APTT  --   --   --   --  >160*  --   --   --   --   --   LABPROT 29.5*  --   --  20.4*  --   --   --   --   --   --   INR 2.9*  --   --  1.8*  --   --   --   --   --   --   HEPARINUNFRC  --   --   --   --   --    < >  --  0.91* 0.75* 0.60  CREATININE 1.51*  --   --  2.01* 1.89*  --  1.87*  --   --   --   TROPONINIHS  --  34* 38*  --   --   --   --   --   --   --    < > = values in this interval not displayed.    Estimated Creatinine Clearance: 75.4 mL/min (A) (by C-G formula based on SCr of 1.87 mg/dL (H)).   Medical History: Past Medical History:  Diagnosis Date  . Atrial fibrillation ([place])   . CHF (congestive heart failure) ([place])   . Diabetes mellitus without complication ([place])   . Gout   . Hypertension associated with type 2 diabetes mellitus ([place])   . Morbid obesity ([place])   . Obstructive sleep apnea     Medications:  Medications Prior to Admission  Medication Sig Dispense Refill Last Dose  . aspirin EC 81 MG tablet Take 81 mg by mouth daily.   11/15/2018 at Unknown time  . colchicine 0.6 MG tablet Take 12 mg by mouth. At first sing of gout flare   prn at prn  . diltiazem (CARDIZEM CD) 240 MG 24 hr capsule Take 240 mg by mouth daily.    11/15/2018 at Unknown time  . furosemide (LASIX) 40 MG tablet Take 40 mg by mouth 2 (two) times daily.   11/15/2018 at Unknown time  . lisinopril (ZESTRIL) 5 MG tablet Take 5 mg by mouth daily.   11/15/2018 at Unknown time  . metFORMIN (GLUCOPHAGE) 500 MG tablet Take 500 mg by mouth 2 (two) times daily with a meal.   11/15/2018 at Unknown time  . metoprolol tartrate (LOPRESSOR) 100 MG tablet Take 50 mg by mouth 2 (two) times daily.   11/15/2018 at Unknown time  . pravastatin (PRAVACHOL) 80 MG tablet Take  80 mg by mouth every evening.   11/15/2018 at Unknown time  . warfarin (COUMADIN) 4 MG tablet Take 2.5-4 mg by mouth daily. Take 4 mg on Sunday, Tuesday, Wednesday, Thursday, and Saturday. Take 2.5 mg on Monday and Friday   11/15/2018 at Unknown time    Assessment: Pharmacy consulted to initiate Heparin Drip on 58yo patient admitted with acute exacerbation of CHF. Patient diagnosed with nonvalvular atrial fibrillation with acute onset of severe shortness of breath, weakness and fatigue, and hypoxia over the past week. Patient takes warfarin PTA with schedule being 4mg  SuTWThSa, and 2.5mg  MF which was restarted in-patient. INR level on 8/30 was 3.4. [name] Hgb is stable. Will transition from Warfarin to Heparin while in-patient.  Date    INR      Dose 8/28     3           8/29     3.3       4 mg 8/30     3.4 8/31     3.6 9/1       3.8 9/2       3.6 9/3       2.9 9/4       1.8  Heparin 9/4 Heparin infusion started @ 1750 units/hr 9/4 @ 1822 HL: 1.17. Level is supratherapeutic. Will hold heparin infusion 1 hour and restart @ reduced infusion rate of 1400 units/hr. 9/4 @ 0329 HL = 0.79, supratherapeutic >> will reduce heparin infusion to 1300 units/hr  Goal of Therapy:  Heparin level 0.3-0.7 units/ml Monitor platelets by anticoagulation protocol: Yes   Plan:  INR now less than 2. Per discussion on rounds, will start heparin drip. Concern with warfarin being a safe option for patient with  rather significant fluctuation in INR. Patient remains critically ill. Renal function has declined.   9/5 @ 1115 HL 0.91. Level supratherapeutic. Will decrease infusion rate to 1100 units/hr.  Recheck heparin level 6 hours after infusion rate change.   9/5: HL @ 1800 = 0.75 9/6 @ 0240 HL = 0.60, therapeutic on 1000 units/hr  Will continue current rate and recheck HL in 6 hrs to confirm.  CBC with morning labs. [place] Crane, Juanice Warburton A 11/25/2018,4:26 AM

## 2018-11-25 NOTE — Progress Notes (Signed)
Tube feeds held because of risk of aspiration when patient tried to extubate himself.    Tube feeds resumed later same day.    Phillis Knack, RN

## 2018-11-25 NOTE — Progress Notes (Addendum)
ANTICOAGULATION CONSULT NOTE   Pharmacy Consult for Heparin  Indication: atrial fibrillation  No Known Allergies  Patient Measurements: Height: 5' 9.02" (175.3 cm) Weight: (!) 430 lb (195 kg) IBW/kg (Calculated) : 70.74 Heparin Dosing Weight: 123 kg   Vital Signs: Temp: 100.7 F (38.2 C) (09/06 0800) Temp Source: Axillary (09/06 0800) BP: 116/96 (09/06 0900) Pulse Rate: 147 (09/06 1000)  Labs: Recent Labs    11/22/18 2205 11/23/18 0011  11/23/18 0447 11/23/18 1320  11/24/18 0410  11/24/18 1810 11/25/18 0240 11/25/18 0445 11/25/18 0958  HGB  --   --    < > 13.8  --   --  14.9  --   --   --  14.6 14.7  HCT  --   --    < > 44.9  --   --  47.2  --   --   --  46.1 46.1  PLT  --   --    < > 140*  --   --  139*  --   --   --  127* 124*  APTT  --   --   --   --  >160*  --   --   --   --   --   --   --   LABPROT  --   --   --  20.4*  --   --   --   --   --   --   --   --   INR  --   --   --  1.8*  --   --   --   --   --   --   --   --   HEPARINUNFRC  --   --   --   --   --    < >  --    < > 0.75* 0.60  --  0.66  CREATININE  --   --   --  2.01* 1.89*  --  1.87*  --   --   --   --   --   TROPONINIHS 34* 38*  --   --   --   --   --   --   --   --   --   --    < > = values in this interval not displayed.    Estimated Creatinine Clearance: 72.4 mL/min (A) (by C-G formula based on SCr of 1.87 mg/dL (H)).   Medical History: Past Medical History:  Diagnosis Date  . Atrial fibrillation ([place])   . CHF (congestive heart failure) ([place])   . Diabetes mellitus without complication ([place])   . Gout   . Hypertension associated with type 2 diabetes mellitus ([place])   . Morbid obesity ([place])   . Obstructive sleep apnea     Medications:  Medications Prior to Admission  Medication Sig Dispense Refill Last Dose  . aspirin EC 81 MG tablet Take 81 mg by mouth daily.   11/15/2018 at Unknown time  . colchicine 0.6 MG tablet Take 12 mg by mouth. At first sing of gout flare   prn at prn  .  diltiazem (CARDIZEM CD) 240 MG 24 hr capsule Take 240 mg by mouth daily.   11/15/2018 at Unknown time  . furosemide (LASIX) 40 MG tablet Take 40 mg by mouth 2 (two) times daily.   11/15/2018 at Unknown time  . lisinopril (ZESTRIL) 5 MG tablet Take 5 mg by mouth daily.  11/15/2018 at Unknown time  . metFORMIN (GLUCOPHAGE) 500 MG tablet Take 500 mg by mouth 2 (two) times daily with a meal.   11/15/2018 at Unknown time  . metoprolol tartrate (LOPRESSOR) 100 MG tablet Take 50 mg by mouth 2 (two) times daily.   11/15/2018 at Unknown time  . pravastatin (PRAVACHOL) 80 MG tablet Take 80 mg by mouth every evening.   11/15/2018 at Unknown time  . warfarin (COUMADIN) 4 MG tablet Take 2.5-4 mg by mouth daily. Take 4 mg on Sunday, Tuesday, Wednesday, Thursday, and Saturday. Take 2.5 mg on Monday and Friday   11/15/2018 at Unknown time    Assessment: Pharmacy consulted to initiate Heparin Drip on 57yo patient admitted with acute exacerbation of CHF. Patient diagnosed with nonvalvular atrial fibrillation with acute onset of severe shortness of breath, weakness and fatigue, and hypoxia over the past week. Patient takes warfarin PTA with schedule being 4mg  SuTWThSa, and 2.5mg  MF which was restarted in-patient. INR level on 8/30 was 3.4. [name] Hgb is stable. Will transition from Warfarin to Heparin while in-patient.  9/6 0240 HL 0.6 9/6 0958 HL 0.66   Goal of Therapy:  Heparin level 0.3-0.7 units/ml Monitor platelets by anticoagulation protocol: Yes   Plan:  INR now less than 2. Per discussion on rounds, heparin started.  Concern with warfarin being a safe option for patient with rather significant fluctuation in INR. Patient remains critically ill. Renal function has declined.   Heparin level therapeutic. Will continue heparin rate at 1000 units/hr. Will recheck heparin level and CBC with AM labs.    Oswald Hillock, PharmD, BCPS 11/25/2018,10:59 AM

## 2018-11-25 NOTE — Progress Notes (Signed)
CRITICAL CARE NOTE 58 yo w/hx of morbid obesity, HFpEF hx MVR, CHF, PAF, OSA, DM, AHRF came in with acute hypoxemic hypercarbic resp failure with CXR showing b/l pl effusions and pulm edema. CT with restrictive physiology, pulm edema, bilateral pleural effusions and compressive atelectasiss/p extubation on bIPAP Re-intubated 8/31 Severe hypoxia   CC  follow up respiratory failure  SUBJECTIVE Patient remains critically ill Prognosis is guarded fio2 100% PEEP 15    BP (!) 156/111   Pulse 66   Temp (!) 100.5 F (38.1 C) (Axillary)   Resp 20   Ht 5' 9.02" (1.753 m)   Wt (!) 195 kg   SpO2 98%   BMI 63.47 kg/m    I/O last 3 completed shifts: In: 3906.1 [I.V.:2715.2; NG/GT:790.8; IV Piggyback:400.1] Out: 7782 [Urine:6550] Total I/O In: -  Out: 425 [Urine:425]  SpO2: 98 % O2 Flow Rate (L/min): 3 L/min FiO2 (%): 100 %   SIGNIFICANT EVENTS 8/29 admitted for severe resp failure CHF exacerbation 8/30 extubated 8/31 re-intubated, PICC line placed 9/2 severe hypoxia 9/3 RT hemithorax opacification plan for bronch, CVl placed, severe hypoxia 9/5 severe hypoxia 9/6 unable to wean from vent severe hypoxia  REVIEW OF SYSTEMS  [name] UNABLE TO PROVIDE COMPLETE REVIEW OF SYSTEMS DUE TO SEVERE CRITICAL ILLNESS   PHYSICAL EXAMINATION:  GENERAL:critically ill appearing, +resp distress HEAD: Normocephalic, atraumatic.  EYES: Pupils equal, round, reactive to light.  No scleral icterus.  MOUTH: Moist mucosal membrane. NECK: Supple.  PULMONARY: +rhonchi, +wheezing CARDIOVASCULAR: S1 and S2. Regular rate and rhythm. No murmurs, rubs, or gallops.  GASTROINTESTINAL: Soft, nontender, -distended. No masses. Positive bowel sounds. No hepatosplenomegaly.  MUSCULOSKELETAL: No swelling, clubbing, or edema.  NEUROLOGIC: obtunded, GCS<8 SKIN:intact,warm,dry  MEDICATIONS: I have reviewed all medications and confirmed regimen as documented   CULTURE RESULTS   Recent Results  (from the past 240 hour(s))  SARS CORONAVIRUS 2 (TAT 6-12 HRS) Nasal Swab Aptima Multi Swab     Status: None   Collection Time: 10/24/2018  4:55 PM   Specimen: Aptima Multi Swab; Nasal Swab  Result Value Ref Range Status   SARS Coronavirus 2 NEGATIVE NEGATIVE Final    Comment: (NOTE) SARS-CoV-2 target nucleic acids are NOT DETECTED. The SARS-CoV-2 RNA is generally detectable in upper and lower respiratory specimens during the acute phase of infection. Negative results do not preclude SARS-CoV-2 infection, do not rule out co-infections with other pathogens, and should not be used as the sole basis for treatment or other patient management decisions. Negative results must be combined with clinical observations, patient history, and epidemiological information. The expected result is Negative. Fact Sheet for Patients: SugarRoll.be Fact Sheet for Healthcare Providers: https://www.woods-mathews.com/ This test is not yet approved or cleared by the [place] FDA and  has been authorized for detection and/or diagnosis of SARS-CoV-2 by FDA under an Emergency Use Authorization (EUA). This EUA will remain  in effect (meaning this test can be used) for the duration of the COVID-19 declaration under Section 56 4(b)(1) of the Act, 21 U.S.C. section 360bbb-3(b)(1), unless the authorization is terminated or revoked sooner. Performed at [facility], [place] [address] [place], [place] [address]   SARS Coronavirus 2 [facility] order, Performed in [facility] hospital lab) Nasopharyngeal Nasopharyngeal Swab     Status: None   Collection Time: 11/14/2018 10:34 PM   Specimen: Nasopharyngeal Swab  Result Value Ref Range Status   SARS Coronavirus 2 NEGATIVE NEGATIVE Final    Comment: (NOTE) If result is NEGATIVE SARS-CoV-2  target nucleic acids are NOT DETECTED. The SARS-CoV-2 RNA is generally detectable in upper and lower  respiratory specimens during the  acute phase of infection. The lowest  concentration of SARS-CoV-2 viral copies this assay can detect is 250  copies / mL. A negative result does not preclude SARS-CoV-2 infection  and should not be used as the sole basis for treatment or other  patient management decisions.  A negative result may occur with  improper specimen collection / handling, submission of specimen other  than nasopharyngeal swab, presence of viral mutation(s) within the  areas targeted by this assay, and inadequate number of viral copies  (<250 copies / mL). A negative result must be combined with clinical  observations, patient history, and epidemiological information. If result is POSITIVE SARS-CoV-2 target nucleic acids are DETECTED. The SARS-CoV-2 RNA is generally detectable in upper and lower  respiratory specimens dur ing the acute phase of infection.  Positive  results are indicative of active infection with SARS-CoV-2.  Clinical  correlation with patient history and other diagnostic information is  necessary to determine patient infection status.  Positive results do  not rule out bacterial infection or co-infection with other viruses. If result is PRESUMPTIVE POSTIVE SARS-CoV-2 nucleic acids MAY BE PRESENT.   A presumptive positive result was obtained on the submitted specimen  and confirmed on repeat testing.  While 2019 novel coronavirus  (SARS-CoV-2) nucleic acids may be present in the submitted sample  additional confirmatory testing may be necessary for epidemiological  and / or clinical management purposes  to differentiate between  SARS-CoV-2 and other Sarbecovirus currently known to infect humans.  If clinically indicated additional testing with an alternate test  methodology [phone]) is advised. The SARS-CoV-2 RNA is generally  detectable in upper and lower respiratory sp ecimens during the acute  phase of infection. The expected result is Negative. Fact Sheet for Patients:   StrictlyIdeas.no Fact Sheet for Healthcare Providers: BankingDealers.co.za This test is not yet approved or cleared by the [place] FDA and has been authorized for detection and/or diagnosis of SARS-CoV-2 by FDA under an Emergency Use Authorization (EUA).  This EUA will remain in effect (meaning this test can be used) for the duration of the COVID-19 declaration under Section 564(b)(1) of the Act, 21 U.S.C. section 360bbb-3(b)(1), unless the authorization is terminated or revoked sooner. Performed at [facility], [address] [place], [place] [address]   Culture, respiratory (non-expectorated)     Status: None   Collection Time: 11/12/2018 10:34 PM   Specimen: Tracheal Aspirate; Respiratory  Result Value Ref Range Status   Specimen Description   Final    TRACHEAL ASPIRATE Performed at [facility], [address] [place], [place] [address]    Special Requests   Final    NONE Performed at [facility], [address] [place], [place] [address]    Gram Stain   Final    RARE WBC PRESENT, PREDOMINANTLY PMN ABUNDANT GRAM POSITIVE COCCI IN CHAINS    Culture   Final    Consistent with normal respiratory flora. Performed at [facility], [place] [address] [place], [place] [address]    Report Status 11/19/2018 FINAL  Final  Urine Culture     Status: None   Collection Time: 11/15/2018 11:12 PM   Specimen: Urine, Random  Result Value Ref Range Status   Specimen Description   Final    URINE, RANDOM Performed at [facility], [place],  [place] [address]    Special Requests   Final    NONE Performed at [facility], [address] [place], [place] [address]    Culture   Final    NO GROWTH Performed at [facility], [place] [address], [place], [place] [address]    Report Status 11/18/2018 FINAL  Final  MRSA PCR Screening     Status: None   Collection Time:  10/27/2018 11:28 PM   Specimen: Nasopharyngeal  Result Value Ref Range Status   MRSA by PCR NEGATIVE NEGATIVE Final    Comment:        The GeneXpert MRSA Assay (FDA approved for NASAL specimens only), is one component of a comprehensive MRSA colonization surveillance program. It is not intended to diagnose MRSA infection nor to guide or monitor treatment for MRSA infections. Performed at [facility], [address] [place], [place] [address]   Culture, blood (Routine X 2) w Reflex to ID Panel     Status: None   Collection Time: 11/17/18 12:19 AM   Specimen: BLOOD  Result Value Ref Range Status   Specimen Description BLOOD RIGHT ANTECUBITAL  Final   Special Requests   Final    BOTTLES DRAWN AEROBIC AND ANAEROBIC Blood Culture adequate volume   Culture   Final    NO GROWTH 5 DAYS Performed at [facility], [address] [place], [place] [address]    Report Status 11/22/2018 FINAL  Final  Culture, blood (Routine X 2) w Reflex to ID Panel     Status: None   Collection Time: 11/17/18 12:31 AM   Specimen: BLOOD  Result Value Ref Range Status   Specimen Description BLOOD CENTRAL LINE MIDLINE  Final   Special Requests   Final    BOTTLES DRAWN AEROBIC AND ANAEROBIC Blood Culture adequate volume   Culture   Final    NO GROWTH 5 DAYS Performed at [facility], [address], [place], [place] [address]    Report Status 11/22/2018 FINAL  Final  Culture, bal-quantitative     Status: Abnormal (Preliminary result)   Collection Time: 11/22/18  7:48 AM   Specimen: Bronchoalveolar Lavage; Respiratory  Result Value Ref Range Status   Specimen Description   Final    BRONCHIAL ALVEOLAR LAVAGE Performed at [facility], [address], [place], [place] [address]    Special Requests   Final    Immunocompromised Performed at [facility], [address] [place], [place] [address]    Gram Stain   Final    FEW WBC PRESENT, PREDOMINANTLY  PMN FEW GRAM POSITIVE COCCI IN PAIRS IN CLUSTERS FEW GRAM POSITIVE RODS    Culture (A)  Final    >=100,000 COLONIES/mL STAPHYLOCOCCUS SIMULANS >=100,000 COLONIES/mL STREPTOCOCCUS MITIS/ORALIS SUSCEPTIBILITIES TO FOLLOW Performed at [place] [address], [place],  [address]    Report Status PENDING  Incomplete          Indwelling Urinary Catheter continued, requirement due to   Reason to continue Indwelling Urinary Catheter strict Intake/Output monitoring for hemodynamic instability   Central Line/ continued, requirement due to  Reason to continue [place] of central venous pressure or other hemodynamic parameters and poor IV access   Ventilator continued, requirement due to severe respiratory failure   Ventilator Sedation RASS 0 to -2      ASSESSMENT AND PLAN SYNOPSIS  Severe ACUTE Hypoxic and Hypercapnic Respiratory Failurefrom pulm edema with effusions and progressiove renal  failure with OHS and OSA with morbid obesitywith pneumonia/ARDS   Severe ACUTE Hypoxic and Hypercapnic Respiratory Failure -continue Full MV support -continue Bronchodilator Therapy -Wean Fio2 and PEEP as tolerated Unable to wean from vent  [place] EF -oxygen as needed -Lasix as tolerated   ACUTE KIDNEY INJURY/Renal Failure -follow chem 7 -follow UO -continue Foley Catheter-assess need -Avoid nephrotoxic agents -Recheck creatinine    NEUROLOGY - intubated and sedated - minimal sedation to achieve a RASS goal: -1   CARDIAC ICU monitoring  ID -continue IV abx as prescibed -follow up cultures  GI GI PROPHYLAXIS as indicated  NUTRITIONAL STATUS DIET-->TF's as tolerated Constipation protocol as indicated  ENDO - will use ICU hypoglycemic\Hyperglycemia protocol if indicated   ELECTROLYTES -follow labs as needed -replace as needed -pharmacy consultation and following   DVT/GI PRX ordered TRANSFUSIONS AS  NEEDED MONITOR FSBS ASSESS the need for LABS as needed   Critical Care Time devoted to patient care services described in this note is 32 minutes.   Overall, patient is critically ill, prognosis is guarded.  Patient with Multiorgan failure and at high risk for cardiac arrest and death.    Corrin Parker, M.D.  [facility]  Medical Director [place] Director [facility] Cardio-Pulmonary Department

## 2018-11-25 NOTE — Consult Note (Signed)
Pharmacy Antibiotic Note  Kyle White is a 58 y.o. male admitted on 11/19/2018 with pneumonia and sepsis.  Pharmacy has been consulted for cefepime dosing. Pt still spiking fevers. F/u with with sensitivities.   Plan: Day 4. Cefepime 2 g q8H.   Height: 5' 9.02" (175.3 cm) Weight: (!) 430 lb (195 kg) IBW/kg (Calculated) : 70.74  Temp (24hrs), Avg:100.9 F (38.3 C), Min:99.3 F (37.4 C), Max:102.8 F (39.3 C)  Recent Labs  Lab 11/21/18 0346 11/22/18 0429 11/23/18 0447 11/23/18 1320 11/24/18 0410 11/25/18 0445 11/25/18 0958  WBC 6.5 7.2 7.5  --  10.3 8.7 10.2  CREATININE 1.58* 1.51* 2.01* 1.89* 1.87*  --   --     Estimated Creatinine Clearance: 72.4 mL/min (A) (by C-G formula based on SCr of 1.87 mg/dL (H)).    No Known Allergies  Antimicrobials this admission: 9/3 cefepime >>   Microbiology results: Bronchoalveolar Lavage; Respiratory positive for staph simulans and viridans streptococcus.   Thank you for allowing pharmacy to be a part of this patient's care.  Oswald Hillock 11/25/2018 11:02 AM

## 2018-11-25 NOTE — Progress Notes (Signed)
Pharmacy Electrolyte Monitoring Consult:  Pharmacy consulted to assist in monitoring and replacing electrolytes in this 58 y.o. male admitted on 11/17/2018. Patient admitted to ICU on 8/29 and intubated. Patient with past medical history significant for atrial fibrillation, CHF, diabetes, gout, hypertension, obesity, and sleep apnea.   Labs:  Sodium (mmol/L)  Date Value  11/24/2018 145   Potassium (mmol/L)  Date Value  11/24/2018 4.6   Magnesium (mg/dL)  Date Value  11/24/2018 2.7 (H)   Phosphorus (mg/dL)  Date Value  11/24/2018 3.3   Calcium (mg/dL)  Date Value  11/24/2018 8.4 (L)   Albumin (g/dL)  Date Value  11/17/2018 3.5    Assessment/Plan: Electrolytes elevated this am although patient has not received supplementation for some time. Expect s/t decreased renal function. Potassium has remained the same.  Will replace for goal potassium ~ 4 and goal magnesium ~ 2.  Will obtain BMP with am labs and magnesium/phosphorus as warranted.   No replacement at this time.   Pharmacy will continue to monitor and adjust per consult.   Kyle White, PharmD, BCPS Clinical Pharmacist 11/25/2018 11:02 AM

## 2018-11-25 NOTE — Progress Notes (Signed)
[facility]  ROUNDING NOTE   Subjective:  Patient with good urine output over the preceding 24 hours. Urine output was 3 L. Still remains critically ill and is on multiple drips. No new renal function testing today.  Objective:  Vital signs in last 24 hours:  Temp:  [99.3 F (37.4 C)-102.8 F (39.3 C)] 100.7 F (38.2 C) (09/06 0800) Pulse Rate:  [36-160] 64 (09/06 1100) Resp:  [20] 20 (09/06 1100) BP: (89-156)/(74-124) 123/107 (09/06 1100) SpO2:  [91 %-98 %] 94 % (09/06 1100) FiO2 (%):  [80 %-100 %] 80 % (09/06 0834) Weight:  [195 kg] 195 kg (09/06 0446)  Weight change:  Filed Weights   11/22/18 0500 11/25/18 0446  Weight: (!) 207.3 kg (!) 195 kg    Intake/Output: I/O last 3 completed shifts: In: 3906.1 [I.V.:2715.2; NG/GT:790.8; IV Piggyback:400.1] Out: 6550 [Urine:6550]   Intake/Output this shift:  Total I/O In: -  Out: 875 [Urine:875]  Physical Exam: General: Critically ill-appearing  Head: Endotracheal tube noted to be in place  Eyes: Closed  Neck: Supple, trachea midline  Lungs:  Clear to auscultation, normal effort  Heart: S1S2 no rubs  Abdomen:  Soft, nontender, bowel sounds present  Extremities: trace peripheral edema.  Neurologic: Intubated, sedated  Skin: No lesions       Basic Metabolic Panel: Recent Labs  Lab 11/21/18 0346 11/22/18 0429 11/22/18 2205 11/23/18 0447 11/23/18 1320 11/24/18 0410  NA 138 141  --  144 144 145  K 4.1 4.7 5.5* 5.4* 5.4* 4.6  CL 99 103  --  106 105 105  CO2 28 28  --  _0 GLUCOSE 210* 304*  --  280* 311* 276*  BUN 60* 65*  --  78* 74* 77*  CREATININE 1.58* 1.51*  --  2.01* 1.89* 1.87*  CALCIUM 8.7* 8.7*  --  8.5* 8.5* 8.4*  MG  --  3.1* 3.3*  --   --  2.7*  PHOS  --  5.3*  --   --   --  3.3    Liver Function Tests: No results for input(s): AST, ALT, ALKPHOS, BILITOT, PROT, ALBUMIN in the last 168 hours. No results for input(s): LIPASE, AMYLASE in the last 168 hours. No results for  input(s): AMMONIA in the last 168 hours.  CBC: Recent Labs  Lab 11/20/18 0416  11/22/18 0429 11/23/18 0447 11/24/18 0410 11/25/18 0445 11/25/18 0958  WBC 6.2   < > 7.2 7.5 10.3 8.7 10.2  NEUTROABS 5.1  --  6.3 6.5 8.8*  --   --   HGB 13.4   < > 14.9 13.8 14.9 14.6 14.7  HCT 41.9   < > 46.4 44.9 47.2 46.1 46.1  MCV 93.3   < > 93.5 97.4 96.1 92.9 93.1  PLT 159   < > 156 140* 139* 127* 124*   < > = values in this interval not displayed.    Cardiac Enzymes: No results for input(s): CKTOTAL, CKMB, CKMBINDEX, TROPONINI in the last 168 hours.  BNP: Invalid input(s): POCBNP  CBG: Recent Labs  Lab 11/24/18 2028 11/24/18 2336 11/25/18 0356 11/25/18 0729 11/25/18 1149  GLUCAP 277* 281* 356* 341* 329*    Microbiology: Results for orders placed or performed during the hospital encounter of 11/15/2018  SARS CORONAVIRUS 2 (TAT 6-12 HRS) Nasal Swab Aptima Multi Swab     Status: None   Collection Time: 11/05/2018  4:55 PM   Specimen: Aptima Multi Swab; Nasal Swab  Result Value Ref  Range Status   SARS Coronavirus 2 NEGATIVE NEGATIVE Final    Comment: (NOTE) SARS-CoV-2 target nucleic acids are NOT DETECTED. The SARS-CoV-2 RNA is generally detectable in upper and lower respiratory specimens during the acute phase of infection. Negative results do not preclude SARS-CoV-2 infection, do not rule out co-infections with other pathogens, and should not be used as the sole basis for treatment or other patient management decisions. Negative results must be combined with clinical observations, patient history, and epidemiological information. The expected result is Negative. Fact Sheet for Patients: SugarRoll.be Fact Sheet for Healthcare Providers: https://www.woods-mathews.com/ This test is not yet approved or cleared by the [place] FDA and  has been authorized for detection and/or diagnosis of SARS-CoV-2 by FDA under an Emergency Use  Authorization (EUA). This EUA will remain  in effect (meaning this test can be used) for the duration of the COVID-19 declaration under Section 56 4(b)(1) of the Act, 21 U.S.C. section 360bbb-3(b)(1), unless the authorization is terminated or revoked sooner. Performed at [facility], [place] [address] [place],  [address]   SARS Coronavirus 2 [facility] order, Performed in [facility] hospital lab) Nasopharyngeal Nasopharyngeal Swab     Status: None   Collection Time: 11/06/2018 10:34 PM   Specimen: Nasopharyngeal Swab  Result Value Ref Range Status   SARS Coronavirus 2 NEGATIVE NEGATIVE Final    Comment: (NOTE) If result is NEGATIVE SARS-CoV-2 target nucleic acids are NOT DETECTED. The SARS-CoV-2 RNA is generally detectable in upper and lower  respiratory specimens during the acute phase of infection. The lowest  concentration of SARS-CoV-2 viral copies this assay can detect is 250  copies / mL. A negative result does not preclude SARS-CoV-2 infection  and should not be used as the sole basis for treatment or other  patient management decisions.  A negative result may occur with  improper specimen collection / handling, submission of specimen other  than nasopharyngeal swab, presence of viral mutation(s) within the  areas targeted by this assay, and inadequate number of viral copies  (<250 copies / mL). A negative result must be combined with clinical  observations, patient history, and epidemiological information. If result is POSITIVE SARS-CoV-2 target nucleic acids are DETECTED. The SARS-CoV-2 RNA is generally detectable in upper and lower  respiratory specimens dur ing the acute phase of infection.  Positive  results are indicative of active infection with SARS-CoV-2.  Clinical  correlation with patient history and other diagnostic information is  necessary to determine patient infection status.  Positive results do  not rule out bacterial infection or co-infection  with other viruses. If result is PRESUMPTIVE POSTIVE SARS-CoV-2 nucleic acids MAY BE PRESENT.   A presumptive positive result was obtained on the submitted specimen  and confirmed on repeat testing.  While 2019 novel coronavirus  (SARS-CoV-2) nucleic acids may be present in the submitted sample  additional confirmatory testing may be necessary for epidemiological  and / or clinical management purposes  to differentiate between  SARS-CoV-2 and other Sarbecovirus currently known to infect humans.  If clinically indicated additional testing with an alternate test  methodology [phone]) is advised. The SARS-CoV-2 RNA is generally  detectable in upper and lower respiratory sp ecimens during the acute  phase of infection. The expected result is Negative. Fact Sheet for Patients:  StrictlyIdeas.no Fact Sheet for Healthcare Providers: BankingDealers.co.za This test is not yet approved or cleared by the [place] FDA and has been authorized for detection and/or diagnosis of SARS-CoV-2 by FDA  under an Emergency Use Authorization (EUA).  This EUA will remain in effect (meaning this test can be used) for the duration of the COVID-19 declaration under Section 564(b)(1) of the Act, 21 U.S.C. section 360bbb-3(b)(1), unless the authorization is terminated or revoked sooner. Performed at [facility], [address] [place], [place] [address]   Culture, respiratory (non-expectorated)     Status: None   Collection Time: 11/14/2018 10:34 PM   Specimen: Tracheal Aspirate; Respiratory  Result Value Ref Range Status   Specimen Description   Final    TRACHEAL ASPIRATE Performed at [facility], [address] [place], [place] [address]    Special Requests   Final    NONE Performed at [facility], [address] [place], [place] [address]    Gram Stain   Final    RARE WBC PRESENT, PREDOMINANTLY PMN ABUNDANT GRAM  POSITIVE COCCI IN CHAINS    Culture   Final    Consistent with normal respiratory flora. Performed at [facility], [place] [address] [place], [place] [address]    Report Status 11/19/2018 FINAL  Final  Urine Culture     Status: None   Collection Time: 10/24/2018 11:12 PM   Specimen: Urine, Random  Result Value Ref Range Status   Specimen Description   Final    URINE, RANDOM Performed at [facility], [address], [place], [place] [address]    Special Requests   Final    NONE Performed at [facility], [address] [place], [place] [address]    Culture   Final    NO GROWTH Performed at [facility], [place] [address] [place], [place] [address]    Report Status 11/18/2018 FINAL  Final  MRSA PCR Screening     Status: None   Collection Time: 10/30/2018 11:28 PM   Specimen: Nasopharyngeal  Result Value Ref Range Status   MRSA by PCR NEGATIVE NEGATIVE Final    Comment:        The GeneXpert MRSA Assay (FDA approved for NASAL specimens only), is one component of a comprehensive MRSA colonization surveillance program. It is not intended to diagnose MRSA infection nor to guide or monitor treatment for MRSA infections. Performed at [facility], [address] [place], [place] [address]   Culture, blood (Routine X 2) w Reflex to ID Panel     Status: None   Collection Time: 11/17/18 12:19 AM   Specimen: BLOOD  Result Value Ref Range Status   Specimen Description BLOOD RIGHT ANTECUBITAL  Final   Special Requests   Final    BOTTLES DRAWN AEROBIC AND ANAEROBIC Blood Culture adequate volume   Culture   Final    NO GROWTH 5 DAYS Performed at [facility], [address], [place], [place] [address]    Report Status 11/22/2018 FINAL  Final  Culture, blood (Routine X 2) w Reflex to ID Panel     Status: None   Collection Time: 11/17/18 12:31 AM   Specimen: BLOOD  Result Value Ref Range Status   Specimen Description BLOOD CENTRAL  LINE MIDLINE  Final   Special Requests   Final    BOTTLES DRAWN AEROBIC AND ANAEROBIC Blood Culture adequate volume   Culture   Final    NO GROWTH 5 DAYS Performed at [facility], [address], [place],  [address]    Report Status 11/22/2018 FINAL  Final  Culture, bal-quantitative  Status: Abnormal (Preliminary result)   Collection Time: 11/22/18  7:48 AM   Specimen: Bronchoalveolar Lavage; Respiratory  Result Value Ref Range Status   Specimen Description   Final    BRONCHIAL ALVEOLAR LAVAGE Performed at [facility], [address], [place], [place] [address]    Special Requests   Final    Immunocompromised Performed at [facility], [address] [place], [place] [address]    Gram Stain   Final    FEW WBC PRESENT, PREDOMINANTLY PMN FEW GRAM POSITIVE COCCI IN PAIRS IN CLUSTERS FEW GRAM POSITIVE RODS    Culture (A)  Final    >=100,000 COLONIES/mL STAPHYLOCOCCUS SIMULANS >=100,000 COLONIES/mL STREPTOCOCCUS MITIS/ORALIS SUSCEPTIBILITIES TO FOLLOW Performed at [place] [address] [place], [place] [address]    Report Status PENDING  Incomplete    Coagulation Studies: Recent Labs    11/23/18 0447  LABPROT 20.4*  INR 1.8*    Urinalysis: No results for input(s): COLORURINE, LABSPEC, PHURINE, GLUCOSEU, HGBUR, BILIRUBINUR, KETONESUR, PROTEINUR, UROBILINOGEN, NITRITE, LEUKOCYTESUR in the last 72 hours.  Invalid input(s): APPERANCEUR    Imaging: Dg Chest Port 1 View  Result Date: 11/24/2018 CLINICAL DATA:  Shortness of breath EXAM: PORTABLE CHEST 1 VIEW COMPARISON:  November 22, 2018 FINDINGS: Endotracheal tube tip is seen 3.5 cm above the level of carina. NG tube is seen coursing below the diaphragm. A right-sided central venous catheter is seen within the mid SVC./slight interval improved aeration since prior exam. Probable subsegmental atelectasis remains at the lung bases. There is a retrocardiac opacity.  IMPRESSION: Slight interval improvement in aeration. However there remains a retrocardiac opacity which could be atelectasis and/or infectious etiology. Electronically Signed   By: Prudencio Pair M.D.   On: 11/24/2018 03:51     Medications:   . amiodarone 60 mg/hr (11/25/18 1018)  . ceFEPime (MAXIPIME) IV 2 g (11/25/18 0530)  . dexmedetomidine (PRECEDEX) IV infusion 1 mcg/kg/hr (11/25/18 1020)  . diltiazem (CARDIZEM) infusion Stopped (11/21/18 0201)  . feeding supplement (VITAL HIGH PROTEIN) 1,000 mL (11/25/18 0829)  . heparin 1,000 Units/hr (11/25/18 0409)  . HYDROmorphone 2 mg/hr (11/25/18 0409)  . midazolam 5 mg/hr (11/25/18 5053)  . norepinephrine (LEVOPHED) Adult infusion Stopped (11/24/18 0333)  . phenylephrine (NEO-SYNEPHRINE) Adult infusion Stopped (11/23/18 0241)   . allopurinol  100 mg Per Tube Daily  . ALPRAZolam  0.5 mg Per Tube BID  . aspirin  81 mg Per NG tube Daily  . chlorhexidine gluconate (MEDLINE KIT)  15 mL Mouth Rinse BID  . Chlorhexidine Gluconate Cloth  6 each Topical Daily  . feeding supplement (PRO-STAT SUGAR FREE 64)  60 mL Per Tube TID  . insulin aspart  0-15 Units Subcutaneous Q4H  . insulin aspart  4 Units Subcutaneous Q4H  . insulin detemir  10 Units Subcutaneous Daily  . ipratropium-albuterol  3 mL Nebulization Q6H  . mouth rinse  15 mL Mouth Rinse 10 times per day  . methylPREDNISolone (SOLU-MEDROL) injection  40 mg Intravenous BID  . metoprolol tartrate  50 mg Per NG tube BID  . multivitamin  15 mL Per Tube Daily  . pantoprazole sodium  40 mg Per Tube QHS  . pravastatin  80 mg Per NG tube QPM  . QUEtiapine  25 mg Per Tube QHS  . senna-docusate  2 tablet Oral BID  . sodium chloride flush  10-40 mL Intracatheter Q12H   acetaminophen, bisacodyl, metoprolol tartrate, midazolam, ondansetron (ZOFRAN) IV, sennosides, sodium chloride flush, sodium chloride flush,  vecuronium  Assessment/ Plan:  58 y.o. male with a PMHx of atrial fibrillation on Coumadin,  congestive heart failure, diabetes mellitus type 2, obstructive sleep apnea, morbid obesity who was admitted to [facility] 11/03/2018 for evaluation of increasing shortness of breath.  1.  Acute renal failure/chronic kidney disease stage III.  Baseline creatinine appears to be 1.3 with an EGFR 57.  Suspect acute renal failure now related to cardiorenal syndrome.   -No new creatinine today.  We will order renal function today.  Urine output was 3 L over the preceding 24 hours.  No indication for dialysis.  2.  Acute respiratory failure.   Remains on the ventilator.  FiO2 down to 60%.  Continue current ventilatory support.   LOS: 9 Athalie Newhard 9/6/202012:38 PM

## 2018-11-25 NOTE — Progress Notes (Addendum)
[place] [facility] [place] NAME: Kyle White    MR#:  [id number]  DATE OF BIRTH:  April 26, 1960  SUBJECTIVE:  Clinically not improving patient remains intubated on the ventilator on 100% Fio2, PEEP 15, severely hypoxemic ,unsuccessful weaning trials underwent bronchoscopy 11/22/18    REVIEW OF SYSTEMS:   Review of Systems  Unable to perform ROS: Intubated  Psychiatric/Behavioral: Nervous/anxious:      DRUG ALLERGIES:  No Known Allergies  VITALS:  Blood pressure (!) 123/107, pulse 64, temperature (!) 100.7 F (38.2 C), temperature source Axillary, resp. rate 20, height 5' 9.02" (1.753 m), weight (!) 195 kg, SpO2 94 %.  PHYSICAL EXAMINATION:   Physical Exam  GENERAL:  58 y.o.-year-old patient lying in the bed with no acute distress. Morbidly obese critically ill intubated on the vent EYES: Pupils equal, round, reactive to light and accommodation. No scleral icterus. Extraocular muscles intact.  HEENT: Head atraumatic, normocephalic. Oropharynx and nasopharynx clear.  NECK:  Supple, no jugular venous distention. No thyroid enlargement, no tenderness.  LUNGS: Mod breath sounds bilaterally, positive wheezing, rales, rhonchi. No use of accessory muscles of respiration.  CARDIOVASCULAR: S1, S2 normal. No murmurs, rubs, or gallops.  ABDOMEN: Soft, nontender, nondistended. Bowel sounds present. No organomegaly or mass.  EXTREMITIES: No cyanosis, clubbing or edema b/l.    NEUROLOGIC: sedated GCS less than 8 PSYCHIATRIC: sedated  And on the vent SKIN: No obvious rash, lesion, or ulcer.   LABORATORY PANEL:  CBC Recent Labs  Lab 11/25/18 0958  WBC 10.2  HGB 14.7  HCT 46.1  PLT 124*    Chemistries  Recent Labs  Lab 11/24/18 0410  NA 145  K 4.6  CL 105  CO2 28  GLUCOSE 276*  BUN 77*  CREATININE 1.87*  CALCIUM 8.4*  MG 2.7*   Cardiac Enzymes No results for input(s): TROPONINI in the last 168 hours. RADIOLOGY:  Dg Chest  Port 1 View  Result Date: 11/24/2018 CLINICAL DATA:  Shortness of breath EXAM: PORTABLE CHEST 1 VIEW COMPARISON:  November 22, 2018 FINDINGS: Endotracheal tube tip is seen 3.5 cm above the level of carina. NG tube is seen coursing below the diaphragm. A right-sided central venous catheter is seen within the mid SVC./slight interval improved aeration since prior exam. Probable subsegmental atelectasis remains at the lung bases. There is a retrocardiac opacity. IMPRESSION: Slight interval improvement in aeration. However there remains a retrocardiac opacity which could be atelectasis and/or infectious etiology. Electronically Signed   By: Prudencio Pair M.D.   On: 11/24/2018 03:51   ASSESSMENT AND PLAN:  Kyle White  is a 58 y.o. male with a known history of atrial fibrillation on Coumadin, CHF, diabetes mellitus, and obstructive sleep apnea with CPAP use at home, morbid obesity with sedentary lifestyle on oxygen at 2 L/min at home.  1.Acute on chronic  respiratory failure with severe hypoxia-ARDS with pneumonia -Clinically not improving - Extubated on 8/31 but required reintubation, vent management per PCCM team.  Unsuccessful weaning trials -patient underwent bronchoscopy 11/22/2018. 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 Acute renal failure on chronic kidney disease stage III  with baseline creatinine 1.28-1.89-1.87 -Repeat BMP in the a.m.  and continue to monitor renal function closely -Nephrology consult placed and Dr. Zollie Scale is following -No indication for hemodialysis at this point -Renal ultrasound if no clinical improvement  #  Type IIDiabetes mellitus -Sliding scale insulin -Hemoglobin A1c 7.3  # Generalized weakness -Physical therapy eval once extubated  #.  Abdominal distention -No acute pathology on CT scan or KUB performed  -Could be ileus, NG tube in place   Overall poor prognosis. Appreciate palliative care input CODE STATUS: DNR  DVT Prophylaxis: Lovenox  TOTAL TIME TAKING CARE OF THIS PATIENT:31 minutes.  >50% time spent on counselling and coordination of care  POSSIBLE D/C IN *?* DAYS, DEPENDING ON CLINICAL CONDITION.  Note: This dictation was prepared with Dragon dictation along with smaller phrase technology. Any transcriptional errors that result from this process are unintentional. No friends or family members at bedside  Nicholes Mango M.D on 11/25/2018 at 1:08 PM  Between 7am to 6pm - Pager - [phone]  After 6pm go to [email or web address] - password [email or web address] [facility]  Office  [phone]  CC: Primary care physician; Inc, [place] ServicesPatient ID: Kyle White, male   DOB: 1960/08/21, 58 y.o.   MRN: [id number]

## 2018-11-26 ENCOUNTER — Inpatient Hospital Stay: Payer: Medicare Other

## 2018-11-26 LAB — BASIC METABOLIC PANEL
Anion gap: 11 (ref 5–15)
BUN: 81 mg/dL — ABNORMAL HIGH (ref 6–20)
CO2: 19 mmol/L — ABNORMAL LOW (ref 22–32)
Calcium: 8.2 mg/dL — ABNORMAL LOW (ref 8.9–10.3)
Chloride: 115 mmol/L — ABNORMAL HIGH (ref 98–111)
Creatinine, Ser: 2.25 mg/dL — ABNORMAL HIGH (ref 0.61–1.24)
GFR calc Af Amer: 36 mL/min — ABNORMAL LOW (ref >60)
GFR calc non Af Amer: 31 mL/min — ABNORMAL LOW (ref >60)
Glucose, Bld: 420 mg/dL — ABNORMAL HIGH (ref 70–99)
Potassium: 4.1 mmol/L (ref 3.5–5.1)
Sodium: 145 mmol/L (ref 135–145)

## 2018-11-26 LAB — GLUCOSE, CAPILLARY
Glucose-Capillary: 345 mg/dL — ABNORMAL HIGH (ref 70–99)
Glucose-Capillary: 365 mg/dL — ABNORMAL HIGH (ref 70–99)
Glucose-Capillary: 373 mg/dL — ABNORMAL HIGH (ref 70–99)
Glucose-Capillary: 374 mg/dL — ABNORMAL HIGH (ref 70–99)
Glucose-Capillary: 379 mg/dL — ABNORMAL HIGH (ref 70–99)
Glucose-Capillary: 399 mg/dL — ABNORMAL HIGH (ref 70–99)

## 2018-11-26 LAB — CBC
HCT: 43.8 % (ref 39.0–52.0)
Hemoglobin: 13.8 g/dL (ref 13.0–17.0)
MCH: 29.7 pg (ref 26.0–34.0)
MCHC: 31.5 g/dL (ref 30.0–36.0)
MCV: 94.4 fL (ref 80.0–100.0)
Platelets: 107 10*3/uL — ABNORMAL LOW (ref 150–400)
RBC: 4.64 MIL/uL (ref 4.22–5.81)
RDW: 13.3 % (ref 11.5–15.5)
WBC: 8.9 10*3/uL (ref 4.0–10.5)
nRBC: 1.2 % — ABNORMAL HIGH (ref 0.0–0.2)

## 2018-11-26 LAB — MAGNESIUM: Magnesium: 2.6 mg/dL — ABNORMAL HIGH (ref 1.7–2.4)

## 2018-11-26 LAB — PHOSPHORUS: Phosphorus: 4.2 mg/dL (ref 2.5–4.6)

## 2018-11-26 LAB — HEPARIN LEVEL (UNFRACTIONATED): Heparin Unfractionated: 0.63 IU/mL (ref 0.30–0.70)

## 2018-11-26 MED ORDER — VANCOMYCIN HCL 10 G IV SOLR
2500.0000 mg | Freq: Once | INTRAVENOUS | Status: AC
  Administered 2018-11-26: 2500 mg via INTRAVENOUS
  Filled 2018-11-26: qty 2500

## 2018-11-26 MED ORDER — VANCOMYCIN VARIABLE DOSE PER UNSTABLE RENAL FUNCTION (PHARMACIST DOSING): Status: DC

## 2018-11-26 NOTE — Progress Notes (Signed)
[facility]  ROUNDING NOTE   Subjective:  Patient remains critically ill. BUN currently 81 with a creatinine of 2.25. Still on the ventilator.  Objective:  Vital signs in last 24 hours:  Temp:  [100.3 F (37.9 C)-102.4 F (39.1 C)] 101.6 F (38.7 C) (09/07 0400) Pulse Rate:  [39-104] 39 (09/07 0600) Resp:  [19-20] 20 (09/07 0600) BP: (113-173)/(76-159) 138/99 (09/07 0600) SpO2:  [91 %-96 %] 93 % (09/07 0823) FiO2 (%):  [40 %-50 %] 40 % (09/07 0823) Weight:  [210.9 kg] 210.9 kg (09/07 0312)  Weight change: 15.9 kg Filed Weights   11/25/18 0446 11/26/18 0312  Weight: (!) 195 kg (!) 210.9 kg    Intake/Output: I/O last 3 completed shifts: In: 4485.9 [I.V.:3331.9; NG/GT:659.5; IV Piggyback:494.5] Out: 4000 [Urine:4000]   Intake/Output this shift:  No intake/output data recorded.  Physical Exam: General: Critically ill-appearing  Head: Endotracheal tube noted to be in place  Eyes: Closed  Neck: Supple, trachea midline  Lungs:  Clear to auscultation, normal effort  Heart: S1S2 no rubs  Abdomen:  Soft, nontender, bowel sounds present  Extremities: trace peripheral edema.  Neurologic: Intubated, sedated  Skin: No lesions       Basic Metabolic Panel: Recent Labs  Lab 11/22/18 0429 11/22/18 2205 11/23/18 0447 11/23/18 1320 11/24/18 0410 11/25/18 1250 11/26/18 0455  NA 141  --  144 144 145 145 145  K 4.7 5.5* 5.4* 5.4* 4.6 4.1 4.1  CL 103  --  106 105 105 112* 115*  CO2 28  --  _0 20* 19*  GLUCOSE 304*  --  280* 311* 276* 377* 420*  BUN 65*  --  78* 74* 77* 79* 81*  CREATININE 1.51*  --  2.01* 1.89* 1.87* 2.26* 2.25*  CALCIUM 8.7*  --  8.5* 8.5* 8.4* 8.5* 8.2*  MG 3.1* 3.3*  --   --  2.7*  --  2.6*  PHOS 5.3*  --   --   --  3.3 3.5 4.2    Liver Function Tests: Recent Labs  Lab 11/25/18 1250  ALBUMIN 2.6*   No results for input(s): LIPASE, AMYLASE in the last 168 hours. No results for input(s): AMMONIA in the last 168  hours.  CBC: Recent Labs  Lab 11/20/18 0416  11/22/18 0429 11/23/18 0447 11/24/18 0410 11/25/18 0445 11/25/18 0958 11/26/18 0455  WBC 6.2   < > 7.2 7.5 10.3 8.7 10.2 8.9  NEUTROABS 5.1  --  6.3 6.5 8.8*  --   --   --   HGB 13.4   < > 14.9 13.8 14.9 14.6 14.7 13.8  HCT 41.9   < > 46.4 44.9 47.2 46.1 46.1 43.8  MCV 93.3   < > 93.5 97.4 96.1 92.9 93.1 94.4  PLT 159   < > 156 140* 139* 127* 124* 107*   < > = values in this interval not displayed.    Cardiac Enzymes: No results for input(s): CKTOTAL, CKMB, CKMBINDEX, TROPONINI in the last 168 hours.  BNP: Invalid input(s): POCBNP  CBG: Recent Labs  Lab 11/25/18 1931 11/25/18 2342 11/26/18 0349 11/26/18 0421 11/26/18 0749  GLUCAP 319* 352* 365* 373* 379*    Microbiology: Results for orders placed or performed during the hospital encounter of 10/25/2018  SARS CORONAVIRUS 2 (TAT 6-12 HRS) Nasal Swab Aptima Multi Swab     Status: None   Collection Time: 10/26/2018  4:55 PM   Specimen: Aptima Multi Swab; Nasal Swab  Result Value Ref Range  Status   SARS Coronavirus 2 NEGATIVE NEGATIVE Final    Comment: (NOTE) SARS-CoV-2 target nucleic acids are NOT DETECTED. The SARS-CoV-2 RNA is generally detectable in upper and lower respiratory specimens during the acute phase of infection. Negative results do not preclude SARS-CoV-2 infection, do not rule out co-infections with other pathogens, and should not be used as the sole basis for treatment or other patient management decisions. Negative results must be combined with clinical observations, patient history, and epidemiological information. The expected result is Negative. Fact Sheet for Patients: SugarRoll.be Fact Sheet for Healthcare Providers: https://www.woods-mathews.com/ This test is not yet approved or cleared by the [place] FDA and  has been authorized for detection and/or diagnosis of SARS-CoV-2 by FDA under an Emergency  Use Authorization (EUA). This EUA will remain  in effect (meaning this test can be used) for the duration of the COVID-19 declaration under Section 56 4(b)(1) of the Act, 21 U.S.C. section 360bbb-3(b)(1), unless the authorization is terminated or revoked sooner. Performed at [facility], [place] [address], [place], [place] [address]   SARS Coronavirus 2 [facility] order, Performed in [facility] hospital lab) Nasopharyngeal Nasopharyngeal Swab     Status: None   Collection Time: 11/11/2018 10:34 PM   Specimen: Nasopharyngeal Swab  Result Value Ref Range Status   SARS Coronavirus 2 NEGATIVE NEGATIVE Final    Comment: (NOTE) If result is NEGATIVE SARS-CoV-2 target nucleic acids are NOT DETECTED. The SARS-CoV-2 RNA is generally detectable in upper and lower  respiratory specimens during the acute phase of infection. The lowest  concentration of SARS-CoV-2 viral copies this assay can detect is 250  copies / mL. A negative result does not preclude SARS-CoV-2 infection  and should not be used as the sole basis for treatment or other  patient management decisions.  A negative result may occur with  improper specimen collection / handling, submission of specimen other  than nasopharyngeal swab, presence of viral mutation(s) within the  areas targeted by this assay, and inadequate number of viral copies  (<250 copies / mL). A negative result must be combined with clinical  observations, patient history, and epidemiological information. If result is POSITIVE SARS-CoV-2 target nucleic acids are DETECTED. The SARS-CoV-2 RNA is generally detectable in upper and lower  respiratory specimens dur ing the acute phase of infection.  Positive  results are indicative of active infection with SARS-CoV-2.  Clinical  correlation with patient history and other diagnostic information is  necessary to determine patient infection status.  Positive results do  not rule out bacterial infection or  co-infection with other viruses. If result is PRESUMPTIVE POSTIVE SARS-CoV-2 nucleic acids MAY BE PRESENT.   A presumptive positive result was obtained on the submitted specimen  and confirmed on repeat testing.  While 2019 novel coronavirus  (SARS-CoV-2) nucleic acids may be present in the submitted sample  additional confirmatory testing may be necessary for epidemiological  and / or clinical management purposes  to differentiate between  SARS-CoV-2 and other Sarbecovirus currently known to infect humans.  If clinically indicated additional testing with an alternate test  methodology [id number]) is advised. The SARS-CoV-2 RNA is generally  detectable in upper and lower respiratory sp ecimens during the acute  phase of infection. The expected result is Negative. Fact Sheet for Patients:  StrictlyIdeas.no Fact Sheet for Healthcare Providers: BankingDealers.co.za This test is not yet approved or cleared by the [place] FDA and has been authorized for detection and/or diagnosis of SARS-CoV-2 by FDA under  an Emergency Use Authorization (EUA).  This EUA will remain in effect (meaning this test can be used) for the duration of the COVID-19 declaration under Section 564(b)(1) of the Act, 21 U.S.C. section 360bbb-3(b)(1), unless the authorization is terminated or revoked sooner. Performed at [facility], [address] [place], [place] [address]   Culture, respiratory (non-expectorated)     Status: None   Collection Time: 11/15/2018 10:34 PM   Specimen: Tracheal Aspirate; Respiratory  Result Value Ref Range Status   Specimen Description   Final    TRACHEAL ASPIRATE Performed at [facility], [address] [place], [place] [address]    Special Requests   Final    NONE Performed at [facility], [address] [place], [place] [address]    Gram Stain   Final    RARE WBC PRESENT, PREDOMINANTLY  PMN ABUNDANT GRAM POSITIVE COCCI IN CHAINS    Culture   Final    Consistent with normal respiratory flora. Performed at [facility], [place] [address] [place], [place] [address]    Report Status 11/19/2018 FINAL  Final  Urine Culture     Status: None   Collection Time: 11/02/2018 11:12 PM   Specimen: Urine, Random  Result Value Ref Range Status   Specimen Description   Final    URINE, RANDOM Performed at [facility], [address] [place], [place] [address]    Special Requests   Final    NONE Performed at [facility], [address], [place], [place] [address]    Culture   Final    NO GROWTH Performed at [facility], [place] [address], [place], [place] [address]    Report Status 11/18/2018 FINAL  Final  MRSA PCR Screening     Status: None   Collection Time: 11/08/2018 11:28 PM   Specimen: Nasopharyngeal  Result Value Ref Range Status   MRSA by PCR NEGATIVE NEGATIVE Final    Comment:        The GeneXpert MRSA Assay (FDA approved for NASAL specimens only), is one component of a comprehensive MRSA colonization surveillance program. It is not intended to diagnose MRSA infection nor to guide or monitor treatment for MRSA infections. Performed at [facility], [address] [place], [place] [address]   Culture, blood (Routine X 2) w Reflex to ID Panel     Status: None   Collection Time: 11/17/18 12:19 AM   Specimen: BLOOD  Result Value Ref Range Status   Specimen Description BLOOD RIGHT ANTECUBITAL  Final   Special Requests   Final    BOTTLES DRAWN AEROBIC AND ANAEROBIC Blood Culture adequate volume   Culture   Final    NO GROWTH 5 DAYS Performed at [facility], [address] [place], [place] [address]    Report Status 11/22/2018 FINAL  Final  Culture, blood (Routine X 2) w Reflex to ID Panel     Status: None   Collection Time: 11/17/18 12:31 AM   Specimen: BLOOD  Result Value Ref Range Status   Specimen  Description BLOOD CENTRAL LINE MIDLINE  Final   Special Requests   Final    BOTTLES DRAWN AEROBIC AND ANAEROBIC Blood Culture adequate volume   Culture   Final    NO GROWTH 5 DAYS Performed at [facility], [address], [place], [place] [address]    Report Status 11/22/2018 FINAL  Final  Culture, bal-quantitative     Status:  Abnormal   Collection Time: 11/22/18  7:48 AM   Specimen: Bronchoalveolar Lavage; Respiratory  Result Value Ref Range Status   Specimen Description   Final    BRONCHIAL ALVEOLAR LAVAGE Performed at [facility], [address] [place], [place] [address]    Special Requests   Final    Immunocompromised Performed at [facility], [place], [place] [address]    Gram Stain   Final    FEW WBC PRESENT, PREDOMINANTLY PMN FEW GRAM POSITIVE COCCI IN PAIRS IN CLUSTERS FEW GRAM POSITIVE RODS Performed at [facility], [place] [address] [place], [place] [address]    Culture (A)  Final    >=100,000 COLONIES/mL STAPHYLOCOCCUS SIMULANS >=100,000 COLONIES/mL STREPTOCOCCUS MITIS/ORALIS    Report Status 11/25/2018 FINAL  Final   Organism ID, Bacteria STAPHYLOCOCCUS SIMULANS (A)  Final   Organism ID, Bacteria STREPTOCOCCUS MITIS/ORALIS (A)  Final      Susceptibility   Staphylococcus simulans - MIC*    CIPROFLOXACIN <=0.5 SENSITIVE Sensitive     ERYTHROMYCIN <=0.25 SENSITIVE Sensitive     GENTAMICIN <=0.5 SENSITIVE Sensitive     OXACILLIN <=0.25 SENSITIVE Sensitive     TETRACYCLINE >=16 RESISTANT Resistant     VANCOMYCIN <=0.5 SENSITIVE Sensitive     TRIMETH/SULFA <=10 SENSITIVE Sensitive     CLINDAMYCIN <=0.25 SENSITIVE Sensitive     RIFAMPIN <=0.5 SENSITIVE Sensitive     Inducible Clindamycin NEGATIVE Sensitive     * >=100,000 COLONIES/mL STAPHYLOCOCCUS SIMULANS   Streptococcus mitis/oralis - MIC*    TETRACYCLINE >=16 RESISTANT Resistant     VANCOMYCIN 0.25 SENSITIVE Sensitive     CLINDAMYCIN 0.5 INTERMEDIATE  Intermediate     * >=100,000 COLONIES/mL STREPTOCOCCUS MITIS/ORALIS    Coagulation Studies: No results for input(s): LABPROT, INR in the last 72 hours.  Urinalysis: No results for input(s): COLORURINE, LABSPEC, PHURINE, GLUCOSEU, HGBUR, BILIRUBINUR, KETONESUR, PROTEINUR, UROBILINOGEN, NITRITE, LEUKOCYTESUR in the last 72 hours.  Invalid input(s): APPERANCEUR    Imaging: US Renal  Result Date: 11/25/2018 CLINICAL DATA:  Acute renal failure. EXAM: RENAL / URINARY TRACT ULTRASOUND COMPLETE COMPARISON:  Body CT November 17, 2018 FINDINGS: Right Kidney: Renal measurements: 12.2 x 6.9 x 7.5 cm = volume: 334 mL . Echogenicity within normal limits. No mass or hydronephrosis visualized. Left Kidney: Renal measurements: 14.9 x 6.5 x 6.7 cm = volume: 341 mL. Echogenicity within normal limits. No mass or hydronephrosis visualized. Bladder: Decompressed around urinary Foley and therefore not well evaluated. IMPRESSION: Normal appearance of the kidneys, accounting for the technical limitations of the study caused by body habitus. Electronically Signed   By: Fidela Salisbury M.D.   On: 11/25/2018 15:43     Medications:   . amiodarone 60 mg/hr (11/26/18 1109)  . ceFEPime (MAXIPIME) IV 200 mL/hr at 11/26/18 8676  . dexmedetomidine (PRECEDEX) IV infusion 1 mcg/kg/hr (11/26/18 1108)  . diltiazem (CARDIZEM) infusion Stopped (11/21/18 0201)  . feeding supplement (VITAL HIGH PROTEIN) 1,000 mL (11/25/18 0829)  . heparin 1,000 Units/hr (11/26/18 7209)  . HYDROmorphone 2 mg/hr (11/26/18 0628)  . midazolam 5 mg/hr (11/26/18 1107)  . norepinephrine (LEVOPHED) Adult infusion Stopped (11/24/18 0333)  . phenylephrine (NEO-SYNEPHRINE) Adult infusion Stopped (11/23/18 0241)  . vancomycin 2,500 mg (11/26/18 1113)   . allopurinol  100 mg Per Tube Daily  . ALPRAZolam  0.5 mg Per Tube BID  . aspirin  81 mg Per NG tube Daily  . chlorhexidine gluconate (MEDLINE KIT)  15 mL Mouth Rinse BID  .  Chlorhexidine Gluconate  Cloth  6 each Topical Daily  . feeding supplement (PRO-STAT SUGAR FREE 64)  60 mL Per Tube TID  . insulin aspart  0-15 Units Subcutaneous Q4H  . insulin aspart  4 Units Subcutaneous Q4H  . insulin detemir  10 Units Subcutaneous Daily  . ipratropium-albuterol  3 mL Nebulization Q6H  . mouth rinse  15 mL Mouth Rinse 10 times per day  . methylPREDNISolone (SOLU-MEDROL) injection  40 mg Intravenous BID  . metoprolol tartrate  50 mg Per NG tube BID  . multivitamin  15 mL Per Tube Daily  . pantoprazole sodium  40 mg Per Tube QHS  . pravastatin  80 mg Per NG tube QPM  . QUEtiapine  25 mg Per Tube QHS  . senna-docusate  2 tablet Oral BID  . sodium chloride flush  10-40 mL Intracatheter Q12H  . vancomycin variable dose per unstable renal function (pharmacist dosing)   Does not apply See admin instructions   acetaminophen, bisacodyl, metoprolol tartrate, midazolam, ondansetron (ZOFRAN) IV, sennosides, sodium chloride flush, sodium chloride flush, vecuronium  Assessment/ Plan:  58 y.o. male with a PMHx of atrial fibrillation on Coumadin, congestive heart failure, diabetes mellitus type 2, obstructive sleep apnea, morbid obesity who was admitted to [facility] on 11/03/2018 for evaluation of increasing shortness of breath.  1.  Acute renal failure/chronic kidney disease stage III.  Baseline creatinine appears to be 1.3 with an EGFR 57.  Suspect acute renal failure now related to cardiorenal syndrome.   -Renal ultrasound reviewed.  Negative for hydronephrosis.  Renal function about the same with a creatinine of 2.25.  Urine output was good at 2.8 L.  Therefore no indication for dialysis.  Further plan as per critical care and hospitalist.  2.  Acute respiratory failure.   Patient continues to require ventilatory support at this time.  Weaning the patient off the ventilator has been difficult.  3.  Hypotension.  Patient weaned off of pressors at the moment.  Continue to monitor blood pressure closely to  maintain a map of 65 or greater.  LOS: 10 Ericka Marcellus 9/7/202011:21 AM

## 2018-11-26 NOTE — Procedures (Signed)
Endotracheal Intubation: Patient required placement of an artificial airway secondary to Respiratory Failure  Consent: Emergent.   Hand washing performed prior to starting the procedure.   A time out procedure was called and correct patient, name, & ID confirmed. Needed supplies and equipment were assembled and checked to include ETT, 10 ml syringe, Glidescope, Mac and Miller blades, suction, oxygen and bag mask valve, end tidal CO2 monitor.   Patient was positioned to align the mouth and pharynx to facilitate visualization of the glottis.   Heart rate, SpO2 and blood pressure was continuously monitored during the procedure. Pre-oxygenation was conducted prior to intubation and endotracheal tube was placed through the vocal cords into the trachea.     The artificial airway was placed under direct visualization via glidescope route using a 8.5 ETT on the first attempt.  ETT was secured at 24 cm mark.  Placement was confirmed by auscuitation of lungs with good breath sounds bilaterally and no stomach sounds.  Condensation was noted on endotracheal tube.   Pulse ox 98%.  CO2 detector in place with appropriate color change.   Complications: None .   Operator: Shreyansh Tiffany.   Chest radiograph ordered and pending.    Corrin Parker, M.D.  [facility]  Medical Director [place] Director [facility] Cardio-Pulmonary Department

## 2018-11-26 NOTE — Consult Note (Signed)
Pharmacy Antibiotic Note  Kyle White is a 58 y.o. male admitted on 11/08/2018 with pneumonia.  Pharmacy has been consulted for Vancomycin dosing. Patient currently being treated with Cefepime as well with worsening acute hypoxic and hypercapnic respiratory failure. [name] respiratory culture positive for both Staph Simulans and Strep Mitis/Oralis, therefore hospitalist would like to add Vancomycin as both bacteria are susceptible to medication.    [name] renal function continues to worsen. Will dose via Vancomycin Random levels until renal function improves.  Plan: Patient ordered Vancomycin 2500mg  IV x 1 as loading dose. Will check random level tomorrow morning@1000  and dose appropriately.  Height: 5' 9.02" (175.3 cm) Weight: (!) 465 lb (210.9 kg) IBW/kg (Calculated) : 70.74  Temp (24hrs), Avg:101.1 F (38.4 C), Min:100.3 F (37.9 C), Max:102.4 F (39.1 C)  Recent Labs  Lab 11/23/18 0447 11/23/18 1320 11/24/18 0410 11/25/18 0445 11/25/18 0958 11/25/18 1250 11/26/18 0455  WBC 7.5  --  10.3 8.7 10.2  --  8.9  CREATININE 2.01* 1.89* 1.87*  --   --  2.26* 2.25*    Estimated Creatinine Clearance: 63.4 mL/min (A) (by C-G formula based on SCr of 2.25 mg/dL (H)).    No Known Allergies  Antimicrobials this admission: Cefepime 9/3 >>  Vancomycin 9/7 >>    Microbiology results: 8/29 BCx: NGF 9/3 Respiratory Cx: >=1,000 colonies Staph Simulans and Strep Mitis/Oralis  8/28 MRSA PCR: negative  Thank you for allowing pharmacy to be a part of this patient's care.  Nellene Courtois A Nareh Matzke 11/26/2018 10:49 AM

## 2018-11-26 NOTE — Progress Notes (Signed)
Pharmacy Electrolyte Monitoring Consult:  Pharmacy consulted to assist in monitoring and replacing electrolytes in this 58 y.o. male admitted on 10/29/2018. Patient admitted to ICU on 8/29 and intubated. Patient with past medical history significant for atrial fibrillation, CHF, diabetes, gout, hypertension, obesity, and sleep apnea.   Labs:  Sodium (mmol/L)  Date Value  11/26/2018 145   Potassium (mmol/L)  Date Value  11/26/2018 4.1   Magnesium (mg/dL)  Date Value  11/26/2018 2.6 (H)   Phosphorus (mg/dL)  Date Value  11/26/2018 4.2   Calcium (mg/dL)  Date Value  11/26/2018 8.2 (L)   Albumin (g/dL)  Date Value  11/25/2018 2.6 (L)    Assessment/Plan: Electrolytes elevated this am although patient has not received supplementation for some time. Expect s/t decreased renal function. Potassium has remained the same.  Will replace for goal potassium ~ 4 and goal magnesium ~ 2.  Will obtain BMP with am labs and magnesium/phosphorus as warranted.   No replacement at this time.   Pharmacy will continue to monitor and adjust per consult.   Lu Duffel, PharmD, BCPS Clinical Pharmacist 11/26/2018 8:32 AM

## 2018-11-26 NOTE — Progress Notes (Signed)
CRITICAL CARE NOTE 58 yo w/hx of morbid obesity, HFpEF hx MVR, CHF, PAF, OSA, DM, AHRF came in with acute hypoxemic hypercarbic resp failure with CXR showing b/l pl effusions and pulm edema. CT with restrictive physiology, pulm edema, bilateral pleural effusions and compressive atelectasiss/p extubation on bIPAP Re-intubated 8/31 Severe hypoxia   CC  follow up respiratory failure   SUBJECTIVE Patient remains critically ill Prognosis is guarded fio2 50% PEEP 15    BP (!) 138/99   Pulse (!) 39   Temp (!) 101.6 F (38.7 C) (Axillary)   Resp 20   Ht 5' 9.02" (1.753 m)   Wt (!) 210.9 kg   SpO2 93%   BMI 68.64 kg/m    I/O last 3 completed shifts: In: 4485.9 [I.V.:3331.9; NG/GT:659.5; IV Piggyback:494.5] Out: 4000 [Urine:4000] No intake/output data recorded.  SpO2: 93 % O2 Flow Rate (L/min): 3 L/min FiO2 (%): 40 %   SIGNIFICANT EVENTS 8/29 admitted for severe resp failure CHF exacerbation 8/30 extubated 8/31 re-intubated, PICC line placed 9/2 severe hypoxia 9/3 RT hemithorax opacification plan for bronch, CVl placed, severe hypoxia 9/5 severe hypoxia 9/6 unable to wean from vent severe hypoxia  REVIEW OF SYSTEMS  [name] UNABLE TO PROVIDE COMPLETE REVIEW OF SYSTEMS DUE TO SEVERE CRITICAL ILLNESS   PHYSICAL EXAMINATION:  GENERAL:critically ill appearing, +resp distress HEAD: Normocephalic, atraumatic.  EYES: Pupils equal, round, reactive to light.  No scleral icterus.  MOUTH: Moist mucosal membrane. NECK: Supple.  PULMONARY: +rhonchi, +wheezing CARDIOVASCULAR: S1 and S2. Regular rate and rhythm. No murmurs, rubs, or gallops.  GASTROINTESTINAL: Soft, nontender, -distended. No masses. Positive bowel sounds. No hepatosplenomegaly.  MUSCULOSKELETAL: No swelling, clubbing, or edema.  NEUROLOGIC: obtunded, GCS<8 SKIN:intact,warm,dry  MEDICATIONS: I have reviewed all medications and confirmed regimen as documented   CULTURE RESULTS   Recent Results  (from the past 240 hour(s))  SARS CORONAVIRUS 2 (TAT 6-12 HRS) Nasal Swab Aptima Multi Swab     Status: None   Collection Time: 11/10/2018  4:55 PM   Specimen: Aptima Multi Swab; Nasal Swab  Result Value Ref Range Status   SARS Coronavirus 2 NEGATIVE NEGATIVE Final    Comment: (NOTE) SARS-CoV-2 target nucleic acids are NOT DETECTED. The SARS-CoV-2 RNA is generally detectable in upper and lower respiratory specimens during the acute phase of infection. Negative results do not preclude SARS-CoV-2 infection, do not rule out co-infections with other pathogens, and should not be used as the sole basis for treatment or other patient management decisions. Negative results must be combined with clinical observations, patient history, and epidemiological information. The expected result is Negative. Fact Sheet for Patients: SugarRoll.be Fact Sheet for Healthcare Providers: https://www.woods-mathews.com/ This test is not yet approved or cleared by the [place] FDA and  has been authorized for detection and/or diagnosis of SARS-CoV-2 by FDA under an Emergency Use Authorization (EUA). This EUA will remain  in effect (meaning this test can be used) for the duration of the COVID-19 declaration under Section 56 4(b)(1) of the Act, 21 U.S.C. section 360bbb-3(b)(1), unless the authorization is terminated or revoked sooner. Performed at [facility], [place] [address] [place], [place] [address]   SARS Coronavirus 2 [facility] order, Performed in [facility] hospital lab) Nasopharyngeal Nasopharyngeal Swab     Status: None   Collection Time: 11/19/2018 10:34 PM   Specimen: Nasopharyngeal Swab  Result Value Ref Range Status   SARS Coronavirus 2 NEGATIVE NEGATIVE Final    Comment: (NOTE) If result is NEGATIVE SARS-CoV-2 target nucleic  acids are NOT DETECTED. The SARS-CoV-2 RNA is generally detectable in upper and lower  respiratory specimens during the  acute phase of infection. The lowest  concentration of SARS-CoV-2 viral copies this assay can detect is 250  copies / mL. A negative result does not preclude SARS-CoV-2 infection  and should not be used as the sole basis for treatment or other  patient management decisions.  A negative result may occur with  improper specimen collection / handling, submission of specimen other  than nasopharyngeal swab, presence of viral mutation(s) within the  areas targeted by this assay, and inadequate number of viral copies  (<250 copies / mL). A negative result must be combined with clinical  observations, patient history, and epidemiological information. If result is POSITIVE SARS-CoV-2 target nucleic acids are DETECTED. The SARS-CoV-2 RNA is generally detectable in upper and lower  respiratory specimens dur ing the acute phase of infection.  Positive  results are indicative of active infection with SARS-CoV-2.  Clinical  correlation with patient history and other diagnostic information is  necessary to determine patient infection status.  Positive results do  not rule out bacterial infection or co-infection with other viruses. If result is PRESUMPTIVE POSTIVE SARS-CoV-2 nucleic acids MAY BE PRESENT.   A presumptive positive result was obtained on the submitted specimen  and confirmed on repeat testing.  While 2019 novel coronavirus  (SARS-CoV-2) nucleic acids may be present in the submitted sample  additional confirmatory testing may be necessary for epidemiological  and / or clinical management purposes  to differentiate between  SARS-CoV-2 and other Sarbecovirus currently known to infect humans.  If clinically indicated additional testing with an alternate test  methodology [phone]) is advised. The SARS-CoV-2 RNA is generally  detectable in upper and lower respiratory sp ecimens during the acute  phase of infection. The expected result is Negative. Fact Sheet for Patients:   StrictlyIdeas.no Fact Sheet for Healthcare Providers: BankingDealers.co.za This test is not yet approved or cleared by the [place] FDA and has been authorized for detection and/or diagnosis of SARS-CoV-2 by FDA under an Emergency Use Authorization (EUA).  This EUA will remain in effect (meaning this test can be used) for the duration of the COVID-19 declaration under Section 564(b)(1) of the Act, 21 U.S.C. section 360bbb-3(b)(1), unless the authorization is terminated or revoked sooner. Performed at [facility], [address] [place], [place] [address]   Culture, respiratory (non-expectorated)     Status: None   Collection Time: 10/27/2018 10:34 PM   Specimen: Tracheal Aspirate; Respiratory  Result Value Ref Range Status   Specimen Description   Final    TRACHEAL ASPIRATE Performed at [facility], [address] [place], [place] [address]    Special Requests   Final    NONE Performed at [facility], [address] [place], [place] [address]    Gram Stain   Final    RARE WBC PRESENT, PREDOMINANTLY PMN ABUNDANT GRAM POSITIVE COCCI IN CHAINS    Culture   Final    Consistent with normal respiratory flora. Performed at [facility], [place] [address] [place], [place] [address]    Report Status 11/19/2018 FINAL  Final  Urine Culture     Status: None   Collection Time: 11/13/2018 11:12 PM   Specimen: Urine, Random  Result Value Ref Range Status   Specimen Description   Final    URINE, RANDOM Performed at [facility], [address] [place], [place] [address]  Special Requests   Final    NONE Performed at [facility], [address], [place], [place] [address]    Culture   Final    NO GROWTH Performed at [facility], [place] [address] [place], [place] [address]    Report Status 11/18/2018 FINAL  Final  MRSA PCR Screening     Status: None   Collection Time:  11/05/2018 11:28 PM   Specimen: Nasopharyngeal  Result Value Ref Range Status   MRSA by PCR NEGATIVE NEGATIVE Final    Comment:        The GeneXpert MRSA Assay (FDA approved for NASAL specimens only), is one component of a comprehensive MRSA colonization surveillance program. It is not intended to diagnose MRSA infection nor to guide or monitor treatment for MRSA infections. Performed at [facility], [address] [place], [place] [address]   Culture, blood (Routine X 2) w Reflex to ID Panel     Status: None   Collection Time: 11/17/18 12:19 AM   Specimen: BLOOD  Result Value Ref Range Status   Specimen Description BLOOD RIGHT ANTECUBITAL  Final   Special Requests   Final    BOTTLES DRAWN AEROBIC AND ANAEROBIC Blood Culture adequate volume   Culture   Final    NO GROWTH 5 DAYS Performed at [facility], [address], [place], [place] [address]    Report Status 11/22/2018 FINAL  Final  Culture, blood (Routine X 2) w Reflex to ID Panel     Status: None   Collection Time: 11/17/18 12:31 AM   Specimen: BLOOD  Result Value Ref Range Status   Specimen Description BLOOD CENTRAL LINE MIDLINE  Final   Special Requests   Final    BOTTLES DRAWN AEROBIC AND ANAEROBIC Blood Culture adequate volume   Culture   Final    NO GROWTH 5 DAYS Performed at [facility], [address] [place], [place] [address]    Report Status 11/22/2018 FINAL  Final  Culture, bal-quantitative     Status: Abnormal   Collection Time: 11/22/18  7:48 AM   Specimen: Bronchoalveolar Lavage; Respiratory  Result Value Ref Range Status   Specimen Description   Final    BRONCHIAL ALVEOLAR LAVAGE Performed at [facility], [address] [place], [place] [address]    Special Requests   Final    Immunocompromised Performed at [facility], [place], [place] [address]    Gram Stain   Final    FEW WBC PRESENT, PREDOMINANTLY PMN FEW GRAM  POSITIVE COCCI IN PAIRS IN CLUSTERS FEW GRAM POSITIVE RODS Performed at [facility], [place] [address] [place],  [address]    Culture (A)  Final    >=100,000 COLONIES/mL STAPHYLOCOCCUS SIMULANS >=100,000 COLONIES/mL STREPTOCOCCUS MITIS/ORALIS    Report Status 11/25/2018 FINAL  Final   Organism ID, Bacteria STAPHYLOCOCCUS SIMULANS (A)  Final   Organism ID, Bacteria STREPTOCOCCUS MITIS/ORALIS (A)  Final      Susceptibility   Staphylococcus simulans - MIC*    CIPROFLOXACIN <=0.5 SENSITIVE Sensitive     ERYTHROMYCIN <=0.25 SENSITIVE Sensitive     GENTAMICIN <=0.5 SENSITIVE Sensitive     OXACILLIN <=0.25 SENSITIVE Sensitive     TETRACYCLINE >=16 RESISTANT Resistant     VANCOMYCIN <=0.5 SENSITIVE Sensitive     TRIMETH/SULFA <=10 SENSITIVE Sensitive     CLINDAMYCIN <=0.25 SENSITIVE Sensitive     RIFAMPIN <=0.5 SENSITIVE Sensitive     Inducible  Clindamycin NEGATIVE Sensitive     * >=100,000 COLONIES/mL STAPHYLOCOCCUS SIMULANS   Streptococcus mitis/oralis - MIC*    TETRACYCLINE >=16 RESISTANT Resistant     VANCOMYCIN 0.25 SENSITIVE Sensitive     CLINDAMYCIN 0.5 INTERMEDIATE Intermediate     * >=100,000 COLONIES/mL STREPTOCOCCUS MITIS/ORALIS          IMAGING    US Renal  Result Date: 11/25/2018 CLINICAL DATA:  Acute renal failure. EXAM: RENAL / URINARY TRACT ULTRASOUND COMPLETE COMPARISON:  Body CT November 17, 2018 FINDINGS: Right Kidney: Renal measurements: 12.2 x 6.9 x 7.5 cm = volume: 334 mL . Echogenicity within normal limits. No mass or hydronephrosis visualized. Left Kidney: Renal measurements: 14.9 x 6.5 x 6.7 cm = volume: 341 mL. Echogenicity within normal limits. No mass or hydronephrosis visualized. Bladder: Decompressed around urinary Foley and therefore not well evaluated. IMPRESSION: Normal appearance of the kidneys, accounting for the technical limitations of the study caused by body habitus. Electronically Signed   By: Fidela Salisbury M.D.   On: 11/25/2018  15:43       Indwelling Urinary Catheter continued, requirement due to   Reason to continue Indwelling Urinary Catheter strict Intake/Output monitoring for hemodynamic instability   Central Line/ continued, requirement due to  Reason to continue [place] of central venous pressure or other hemodynamic parameters and poor IV access   Ventilator continued, requirement due to severe respiratory failure   Ventilator Sedation RASS 0 to -2      ASSESSMENT AND PLAN SYNOPSIS  Severe ACUTE Hypoxic and Hypercapnic Respiratory Failurefrom pulm edema with effusions and progressiove renal failure with OHS and OSA with morbid obesitywith pneumonia/ARDS  Severe ACUTE Hypoxic and Hypercapnic Respiratory Failure -continue Full MV support -continue Bronchodilator Therapy -Wean Fio2 and PEEP as tolerated  ACUTE SYSTOLIC CARDIAC FAILURE- HFpEF -oxygen as needed -Lasix as tolerated -follow up cardiac enzymes as indicated   ACUTE KIDNEY INJURY/Renal Failure -follow chem 7 -follow UO -continue Foley Catheter-assess need -Avoid nephrotoxic agents -Recheck creatinine    NEUROLOGY - intubated and sedated - minimal sedation to achieve a RASS goal: -1   CARDIAC ICU monitoring  ID -continue IV abx as prescibed -follow up cultures  GI GI PROPHYLAXIS as indicated  NUTRITIONAL STATUS DIET-->TF's as tolerated Constipation protocol as indicated  ENDO - will use ICU hypoglycemic\Hyperglycemia protocol if indicated   ELECTROLYTES -follow labs as needed -replace as needed -pharmacy consultation and following   DVT/GI PRX ordered TRANSFUSIONS AS NEEDED MONITOR FSBS ASSESS the need for LABS as needed   Critical Care Time devoted to patient care services described in this note is 35 minutes.   Overall, patient is critically ill, prognosis is guarded.  Patient with Multiorgan failure and at high risk for cardiac arrest and death.   [name] DNR Likely  comfort care in next few days   Charlie Char Patricia Pesa, M.D.  [facility]  Medical Director [place] Director [facility] Cardio-Pulmonary Department

## 2018-11-26 NOTE — Progress Notes (Signed)
[place] [facility] [place] NAME: Kyle White    MR#:  [id number]  DATE OF BIRTH:  25-Jan-1961  SUBJECTIVE:  Clinically not improving patient remains intubated on the ventilator on 50% Fio2, PEEP 15, severely hypoxemic ,unsuccessful weaning trials underwent bronchoscopy 11/22/18 Renal function is getting worse  REVIEW OF SYSTEMS:   Review of Systems  Unable to perform ROS: Intubated  Psychiatric/Behavioral: Nervous/anxious:      DRUG ALLERGIES:  No Known Allergies  VITALS:  Blood pressure (!) 138/99, pulse (!) 39, temperature (!) 101.6 F (38.7 C), temperature source Axillary, resp. rate 20, height 5' 9.02" (1.753 m), weight (!) 210.9 kg, SpO2 93 %.  PHYSICAL EXAMINATION:   Physical Exam  GENERAL:  58 y.o.-year-old patient lying in the bed with no acute distress. Morbidly obese critically ill intubated on the vent EYES: Pupils equal, round, reactive to light and accommodation. No scleral icterus. Extraocular muscles intact.  HEENT: Head atraumatic, normocephalic. Oropharynx and nasopharynx clear.  NECK:  Supple, no jugular venous distention. No thyroid enlargement, no tenderness.  LUNGS: Mod breath sounds bilaterally, positive wheezing, rales, rhonchi. No use of accessory muscles of respiration.  CARDIOVASCULAR: S1, S2 normal. No murmurs, rubs, or gallops.  ABDOMEN: Soft, nontender, nondistended. Bowel sounds present. No organomegaly or mass.  EXTREMITIES: No cyanosis, clubbing or edema b/l.    NEUROLOGIC: sedated GCS less than 8 PSYCHIATRIC: sedated  And on the vent SKIN: No obvious rash, lesion, or ulcer.   LABORATORY PANEL:  CBC Recent Labs  Lab 11/26/18 0455  WBC 8.9  HGB 13.8  HCT 43.8  PLT 107*    Chemistries  Recent Labs  Lab 11/26/18 0455  NA 145  K 4.1  CL 115*  CO2 19*  GLUCOSE 420*  BUN 81*  CREATININE 2.25*  CALCIUM 8.2*  MG 2.6*   Cardiac Enzymes No results for input(s): TROPONINI in the last  168 hours. RADIOLOGY:  US Renal  Result Date: 11/25/2018 CLINICAL DATA:  Acute renal failure. EXAM: RENAL / URINARY TRACT ULTRASOUND COMPLETE COMPARISON:  Body CT November 17, 2018 FINDINGS: Right Kidney: Renal measurements: 12.2 x 6.9 x 7.5 cm = volume: 334 mL . Echogenicity within normal limits. No mass or hydronephrosis visualized. Left Kidney: Renal measurements: 14.9 x 6.5 x 6.7 cm = volume: 341 mL. Echogenicity within normal limits. No mass or hydronephrosis visualized. Bladder: Decompressed around urinary Foley and therefore not well evaluated. IMPRESSION: Normal appearance of the kidneys, accounting for the technical limitations of the study caused by body habitus. Electronically Signed   By: Kyle White M.D.   On: 11/25/2018 15:43   ASSESSMENT AND PLAN:  Kyle White  is a 58 y.o. male with a known history of atrial fibrillation on Coumadin, CHF, diabetes mellitus, and obstructive sleep apnea with CPAP use at home, morbid obesity with sedentary lifestyle on oxygen at 2 L/min at home.  1.Acute on chronic  respiratory failure with severe hypoxia-ARDS with pneumonia -Clinically not improving - Extubated on 8/31 but required reintubation, vent management per PCCM team.  Unsuccessful weaning trials -patient underwent bronchoscopy 11/22/2018. 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 -full vent support with hundred percent of FI02, PEEP 15 --- extremely poor prognosis with high mortality and morbidity -Continue tube feeds with pro-stat -Cefepime  -Septic shock from pneumonia with ARDS Off pressors and IV antibiotics Goal is to keep map greater than or equal to 65   #.Obstructive sleep apnea-- chronic  #Acute on chronic  diastolic CHF, EF 55 to 123456 on echo in 2014 at [facility] -Repeat echocardiogram shows EF of 55 to 60%  # History of atrial fibrillation -Coumadin on hold and patient is on heparin drip, amiodarone drip and Cardizem drip  #. Acute renal failure on chronic  kidney disease stage III  with baseline creatinine 1.28-1.89-1.87-2.25 -Repeat BMP in the a.m. and continue to monitor renal function closely -Nephrology consult placed and Dr. Zollie White is following -No indication for hemodialysis at this point as patient has good urine output of 2.8 L -Renal ultrasound if no clinical improvement  #  Type IIDiabetes mellitus -Sliding White insulin -Hemoglobin A1c 7.3  # Generalized weakness -Physical therapy eval once extubated  #.  Abdominal distention -No acute pathology on CT scan or KUB performed  -Could be ileus, NG tube in place   Overall poor prognosis. Appreciate palliative care input CODE STATUS: DNR  DVT Prophylaxis: Lovenox  TOTAL TIME TAKING CARE OF THIS PATIENT:33 minutes.  >50% time spent on counselling and coordination of care  POSSIBLE D/C IN *?* DAYS, DEPENDING ON CLINICAL CONDITION.  Note: This dictation was prepared with Dragon dictation along with smaller phrase technology. Any transcriptional errors that result from this process are unintentional. No friends or family members at bedside  Kyle White M.D on 11/26/2018 at 12:01 PM  Between 7am to 6pm - Pager - [phone]  After 6pm go to [email or web address] - password [email or web address] [facility]  Office  [phone]  CC: Primary care physician; Inc, [place] ServicesPatient ID: Kyle White, male   DOB: [birth year], 58 y.o.   MRN: [id number]

## 2018-11-26 NOTE — Progress Notes (Signed)
ANTICOAGULATION CONSULT NOTE   Pharmacy Consult for Heparin  Indication: atrial fibrillation  No Known Allergies  Patient Measurements: Height: 5' 9.02" (175.3 cm) Weight: (!) 465 lb (210.9 kg) IBW/kg (Calculated) : 70.74 Heparin Dosing Weight: 123 kg   Vital Signs: Temp: 101.6 F (38.7 C) (09/07 0400) Temp Source: Axillary (09/07 0400) BP: 132/93 (09/07 0400) Pulse Rate: 69 (09/07 0400)  Labs: Recent Labs    11/23/18 1320  11/24/18 0410  11/25/18 0240 11/25/18 0445 11/25/18 0958 11/25/18 1250 11/26/18 0455  HGB  --    < > 14.9  --   --  14.6 14.7  --  13.8  HCT  --    < > 47.2  --   --  46.1 46.1  --  43.8  PLT  --    < > 139*  --   --  127* 124*  --  107*  APTT >160*  --   --   --   --   --   --   --   --   HEPARINUNFRC  --    < >  --    < > 0.60  --  0.66  --  0.63  CREATININE 1.89*  --  1.87*  --   --   --   --  2.26* 2.25*   < > = values in this interval not displayed.    Estimated Creatinine Clearance: 63.4 mL/min (A) (by C-G formula based on SCr of 2.25 mg/dL (H)).   Medical History: Past Medical History:  Diagnosis Date  . Atrial fibrillation ([place])   . CHF (congestive heart failure) ([place])   . Diabetes mellitus without complication ([place])   . Gout   . Hypertension associated with type 2 diabetes mellitus ([place])   . Morbid obesity ([place])   . Obstructive sleep apnea     Medications:  Medications Prior to Admission  Medication Sig Dispense Refill Last Dose  . aspirin EC 81 MG tablet Take 81 mg by mouth daily.   11/15/2018 at Unknown time  . colchicine 0.6 MG tablet Take 12 mg by mouth. At first sing of gout flare   prn at prn  . diltiazem (CARDIZEM CD) 240 MG 24 hr capsule Take 240 mg by mouth daily.   11/15/2018 at Unknown time  . furosemide (LASIX) 40 MG tablet Take 40 mg by mouth 2 (two) times daily.   11/15/2018 at Unknown time  . lisinopril (ZESTRIL) 5 MG tablet Take 5 mg by mouth daily.   11/15/2018 at Unknown time  . metFORMIN (GLUCOPHAGE) 500 MG  tablet Take 500 mg by mouth 2 (two) times daily with a meal.   11/15/2018 at Unknown time  . metoprolol tartrate (LOPRESSOR) 100 MG tablet Take 50 mg by mouth 2 (two) times daily.   11/15/2018 at Unknown time  . pravastatin (PRAVACHOL) 80 MG tablet Take 80 mg by mouth every evening.   11/15/2018 at Unknown time  . warfarin (COUMADIN) 4 MG tablet Take 2.5-4 mg by mouth daily. Take 4 mg on Sunday, Tuesday, Wednesday, Thursday, and Saturday. Take 2.5 mg on Monday and Friday   11/15/2018 at Unknown time    Assessment: Pharmacy consulted to initiate Heparin Drip on 58yo patient admitted with acute exacerbation of CHF. Patient diagnosed with nonvalvular atrial fibrillation with acute onset of severe shortness of breath, weakness and fatigue, and hypoxia over the past week. Patient takes warfarin PTA with schedule being 4mg  SuTWThSa, and 2.5mg  MF which was restarted in-patient. INR  level on 8/30 was 3.4. [name] Hgb is stable. Will transition from Warfarin to Heparin while in-patient.  9/6 0240 HL 0.6 9/6 0958 HL 0.66  9/7 0455 HL 0.63, therapeutic  Goal of Therapy:  Heparin level 0.3-0.7 units/ml Monitor platelets by anticoagulation protocol: Yes   Plan:  INR now less than 2. Per discussion on rounds, heparin started.  Concern with warfarin being a safe option for patient with rather significant fluctuation in INR. Patient remains critically ill. Renal function has declined.   Heparin level therapeutic. Will continue heparin rate at 1000 units/hr. Will recheck heparin level and CBC with AM labs.    Ena Dawley, PharmD 11/26/2018,5:40 AM

## 2018-11-26 NOTE — Progress Notes (Signed)
Patient with acute cuff leak and acute sudden hypoxia ETT has been dislodged and was removed   Patient was emergently extubated and re-intubated. I will anticipate that patient will have worsening hypoxia and derecruitement of lungs.  Will obtain CXR for tube placement.

## 2018-11-26 NOTE — Progress Notes (Signed)
Palliative: Mr. Tannen is lying quietly in bed.  He is intubated/ventilated, sedated.  He remains critically ill.  There is no family at bedside at this time.  Conference with CCM attending and bedside nursing staff related to patient condition, needs.  Call to sister, [name] at [phone]. We talk about ventilator support, FIO2 at 40%, but high PEEP. We also talk about selected labs.  [name] denies issues or questions, she tells me that she will be at the hospital later in the day.   PMT to shadow.   Plan:  Continue to treat the treatable, No CPR.   75 minutes Quinn Axe, NP Palliative Medicine Team Team Phone # [phone] Greater than 50% of this time was spent counseling and coordinating care related to the above assessment and plan.

## 2018-11-27 ENCOUNTER — Inpatient Hospital Stay: Payer: Medicare Other

## 2018-11-27 ENCOUNTER — Ambulatory Visit: Payer: Medicare Other | Admitting: Family

## 2018-11-27 LAB — BLOOD GAS, ARTERIAL
Acid-base deficit: 3.5 mmol/L — ABNORMAL HIGH (ref 0.0–2.0)
Acid-base deficit: 5.3 mmol/L — ABNORMAL HIGH (ref 0.0–2.0)
Allens test (pass/fail): POSITIVE — AB
Bicarbonate: 16.1 mmol/L — ABNORMAL LOW (ref 20.0–28.0)
Bicarbonate: 19.1 mmol/L — ABNORMAL LOW (ref 20.0–28.0)
FIO2: 0.5
FIO2: 0.5
O2 Saturation: 95.2 %
O2 Saturation: 94.6 %
PEEP: 15 cmH2O
PEEP: 15 cmH2O
Patient temperature: 39.7
Patient temperature: 37
Pressure control: 25 cmH2O
Pressure control: 15 cmH2O
RATE: 20 resp/min
RATE: 15 resp/min
pCO2 arterial: <19 mmHg — CL (ref 32.0–48.0)
pCO2 arterial: 33 mmHg (ref 32.0–48.0)
pH, Arterial: 7.52 — ABNORMAL HIGH (ref 7.350–7.450)
pH, Arterial: 7.37 (ref 7.350–7.450)
pO2, Arterial: 78 mmHg — ABNORMAL LOW (ref 83.0–108.0)
pO2, Arterial: 76 mmHg — ABNORMAL LOW (ref 83.0–108.0)

## 2018-11-27 LAB — CBC
HCT: 43.2 % (ref 39.0–52.0)
Hemoglobin: 13.7 g/dL (ref 13.0–17.0)
MCH: 29.8 pg (ref 26.0–34.0)
MCHC: 31.7 g/dL (ref 30.0–36.0)
MCV: 94.1 fL (ref 80.0–100.0)
Platelets: 89 10*3/uL — ABNORMAL LOW (ref 150–400)
RBC: 4.59 MIL/uL (ref 4.22–5.81)
RDW: 13.4 % (ref 11.5–15.5)
WBC: 12.5 10*3/uL — ABNORMAL HIGH (ref 4.0–10.5)
nRBC: 1.8 % — ABNORMAL HIGH (ref 0.0–0.2)

## 2018-11-27 LAB — HEPARIN LEVEL (UNFRACTIONATED)
Heparin Unfractionated: 1.03 IU/mL — ABNORMAL HIGH (ref 0.30–0.70)
Heparin Unfractionated: 0.76 IU/mL — ABNORMAL HIGH (ref 0.30–0.70)
Heparin Unfractionated: 0.68 IU/mL (ref 0.30–0.70)

## 2018-11-27 LAB — BASIC METABOLIC PANEL
Anion gap: 12 (ref 5–15)
BUN: 86 mg/dL — ABNORMAL HIGH (ref 6–20)
CO2: 17 mmol/L — ABNORMAL LOW (ref 22–32)
Calcium: 8 mg/dL — ABNORMAL LOW (ref 8.9–10.3)
Chloride: 118 mmol/L — ABNORMAL HIGH (ref 98–111)
Creatinine, Ser: 2.21 mg/dL — ABNORMAL HIGH (ref 0.61–1.24)
GFR calc Af Amer: 36 mL/min — ABNORMAL LOW (ref >60)
GFR calc non Af Amer: 31 mL/min — ABNORMAL LOW (ref >60)
Glucose, Bld: 405 mg/dL — ABNORMAL HIGH (ref 70–99)
Potassium: 4 mmol/L (ref 3.5–5.1)
Sodium: 147 mmol/L — ABNORMAL HIGH (ref 135–145)

## 2018-11-27 LAB — GLUCOSE, CAPILLARY
Glucose-Capillary: 391 mg/dL — ABNORMAL HIGH (ref 70–99)
Glucose-Capillary: 366 mg/dL — ABNORMAL HIGH (ref 70–99)
Glucose-Capillary: 354 mg/dL — ABNORMAL HIGH (ref 70–99)
Glucose-Capillary: 297 mg/dL — ABNORMAL HIGH (ref 70–99)

## 2018-11-27 LAB — VANCOMYCIN, RANDOM: Vancomycin Rm: 13

## 2018-11-27 MED ORDER — CLONAZEPAM 0.5 MG PO TABS
0.2500 mg | ORAL_TABLET | Freq: Every day | ORAL | Status: DC
  Administered 2018-11-27 – 2018-11-29 (×3): 0.25 mg
  Filled 2018-11-27 (×3): qty 1

## 2018-11-27 MED ORDER — POLYETHYLENE GLYCOL 3350 17 G PO PACK
17.0000 g | PACK | Freq: Every day | ORAL | Status: DC
  Administered 2018-11-27 – 2018-11-28 (×2): 17 g
  Filled 2018-11-27 (×2): qty 1

## 2018-11-27 MED ORDER — INSULIN ASPART 100 UNIT/ML ~~LOC~~ SOLN
6.0000 [IU] | SUBCUTANEOUS | Status: DC
  Administered 2018-11-27 – 2018-11-28 (×4): 6 [IU] via SUBCUTANEOUS
  Filled 2018-11-27 (×4): qty 1

## 2018-11-27 MED ORDER — CLONAZEPAM 0.5 MG PO TABS: 0.2500 mg | ORAL_TABLET | Freq: Every day | ORAL | Status: DC

## 2018-11-27 MED ORDER — SODIUM BICARBONATE 8.4 % IV SOLN
50.0000 meq | Freq: Once | INTRAVENOUS | Status: AC
  Administered 2018-11-27: 05:00:00 50 meq via INTRAVENOUS

## 2018-11-27 MED ORDER — VITAL HIGH PROTEIN PO LIQD
1000.0000 mL | ORAL | Status: DC
  Administered 2018-11-28 – 2018-12-11 (×11): 1000 mL

## 2018-11-27 MED ORDER — BISACODYL 10 MG RE SUPP
10.0000 mg | Freq: Once | RECTAL | Status: AC
  Administered 2018-11-27: 10 mg via RECTAL
  Filled 2018-11-27: qty 1

## 2018-11-27 MED ORDER — INSULIN ASPART 100 UNIT/ML ~~LOC~~ SOLN
0.0000 [IU] | SUBCUTANEOUS | Status: DC
  Administered 2018-11-27: 15 [IU] via SUBCUTANEOUS
  Administered 2018-11-27: 20 [IU] via SUBCUTANEOUS
  Administered 2018-11-27: 20:00:00 11 [IU] via SUBCUTANEOUS
  Administered 2018-11-27: 20 [IU] via SUBCUTANEOUS
  Administered 2018-11-28 (×2): 15 [IU] via SUBCUTANEOUS
  Filled 2018-11-27 (×6): qty 1

## 2018-11-27 MED ORDER — VANCOMYCIN HCL 10 G IV SOLR
1750.0000 mg | INTRAVENOUS | Status: DC
  Administered 2018-11-27: 1750 mg via INTRAVENOUS
  Filled 2018-11-27: qty 1750

## 2018-11-27 MED ORDER — FREE WATER
200.0000 mL | Status: DC
  Administered 2018-11-27 – 2018-12-01 (×22): 200 mL

## 2018-11-27 MED ORDER — AMIODARONE IV BOLUS ONLY 150 MG/100ML
150.0000 mg | Freq: Once | INTRAVENOUS | Status: AC
  Administered 2018-11-27: 150 mg via INTRAVENOUS

## 2018-11-27 MED ORDER — SODIUM CHLORIDE 0.9 % IV SOLN
0.0000 ug/min | INTRAVENOUS | Status: DC
  Filled 2018-11-27: qty 4

## 2018-11-27 MED ORDER — SENNOSIDES-DOCUSATE SODIUM 8.6-50 MG PO TABS
2.0000 | ORAL_TABLET | Freq: Two times a day (BID) | ORAL | Status: DC
  Administered 2018-11-27 – 2018-12-03 (×12): 2
  Filled 2018-11-27 (×12): qty 2

## 2018-11-27 MED ORDER — HEPARIN (PORCINE) 25000 UT/250ML-% IV SOLN
550.0000 [IU]/h | INTRAVENOUS | Status: DC
  Administered 2018-11-27: 650 [IU]/h via INTRAVENOUS
  Administered 2018-11-29: 07:00:00 550 [IU]/h via INTRAVENOUS
  Filled 2018-11-27 (×2): qty 250

## 2018-11-27 MED ORDER — ALPRAZOLAM 0.5 MG PO TABS: 1.0000 mg | ORAL_TABLET | Freq: Three times a day (TID) | ORAL | Status: DC

## 2018-11-27 MED ORDER — INSULIN DETEMIR 100 UNIT/ML ~~LOC~~ SOLN
10.0000 [IU] | Freq: Two times a day (BID) | SUBCUTANEOUS | Status: DC
  Administered 2018-11-27 – 2018-11-28 (×3): 10 [IU] via SUBCUTANEOUS
  Filled 2018-11-27 (×5): qty 0.1

## 2018-11-27 MED ORDER — CLONAZEPAM 0.5 MG PO TABS: 0.2500 mg | ORAL_TABLET | Freq: Two times a day (BID) | ORAL | Status: DC

## 2018-11-27 MED ORDER — IBUPROFEN 400 MG PO TABS
800.0000 mg | ORAL_TABLET | Freq: Three times a day (TID) | ORAL | Status: DC | PRN
  Administered 2018-11-27: 800 mg
  Filled 2018-11-27: qty 2

## 2018-11-27 NOTE — Progress Notes (Signed)
Inpatient Diabetes Program Recommendations  AACE/ADA: New Consensus Statement on Inpatient Glycemic Control (2015)  Target Ranges:  Prepandial:   less than 140 mg/dL      Peak postprandial:   less than 180 mg/dL (1-2 hours)      Critically ill patients:  140 - 180 mg/dL   Lab Results  Component Value Date   GLUCAP 366 (H) 11/27/2018   HGBA1C 7.3 (H) 11/17/2018    Review of Glycemic Control Results for Kyle White, Kyle White (MRN [id number]) as of 11/27/2018 10:12  Ref. Range 11/26/2018 07:49 11/26/2018 12:52 11/26/2018 20:04 11/26/2018 23:51 11/27/2018 04:05 11/27/2018 07:24  Glucose-Capillary Latest Ref Range: 70 - 99 mg/dL 379 (H) 399 (H) 374 (H) 345 (H) 391 (H) 366 (H)   Diabetes history: DM 2 Outpatient Diabetes medications: Metformin 1000 mg bid Current orders for Inpatient glycemic control:  Novolog Moderate 0-15 units Q4 hours Levemir 10 units daily, Novolog 4 units q 4 hours  Inpatient Diabetes Program Recommendations:     Consider increasing Levemir to 10 units bid.  Also consider increasing Novolog tube feed coverage to 6 units q 4 hours.   Thanks  Tama Headings RN, MSN, BC-ADM Inpatient Diabetes Coordinator Team Pager [phone] (8a-5p)

## 2018-11-27 NOTE — Progress Notes (Signed)
RT assisted with patient transport to and from CT with no complications.

## 2018-11-27 NOTE — Progress Notes (Signed)
[facility] [place] NAME: Kyle White    MR#:  [id number]  DATE OF BIRTH:  12-07-1960  SUBJECTIVE:  Clinically not improving patient remains intubated on the ventilator, severely hypoxemic ,unsuccessful weaning trials underwent bronchoscopy 11/22/18.  Palliative care team is involved Sister [name] is following   REVIEW OF SYSTEMS:   Review of Systems  Unable to perform ROS: Intubated  Psychiatric/Behavioral: Nervous/anxious:      DRUG ALLERGIES:  No Known Allergies  VITALS:  Blood pressure 111/77, pulse (!) 113, temperature (!) 103.4 F (39.7 C), temperature source Rectal, resp. rate 15, height 5' 9.02" (1.753 m), weight (!) 213.6 kg, SpO2 95 %.  PHYSICAL EXAMINATION:   Physical Exam  GENERAL:  58 y.o.-year-old patient lying in the bed with no acute distress. Morbidly obese critically ill intubated on the vent EYES: Pupils equal, round, reactive to light and accommodation. No scleral icterus. Extraocular muscles intact.  HEENT: Head atraumatic, normocephalic. Oropharynx and nasopharynx clear.  NECK:  Supple, no jugular venous distention. No thyroid enlargement, no tenderness.  LUNGS: Mod breath sounds bilaterally, positive wheezing, rales, rhonchi. No use of accessory muscles of respiration.  CARDIOVASCULAR: S1, S2 normal. No murmurs, rubs, or gallops.  ABDOMEN: Soft, nontender, nondistended. Bowel sounds present. No organomegaly or mass.  EXTREMITIES: No cyanosis, clubbing or edema b/l.    NEUROLOGIC: sedated GCS less than 8 PSYCHIATRIC: sedated  And on the vent SKIN: No obvious rash, lesion, or ulcer.   LABORATORY PANEL:  CBC Recent Labs  Lab 11/27/18 0411  WBC 12.5*  HGB 13.7  HCT 43.2  PLT 89*    Chemistries  Recent Labs  Lab 11/26/18 0455 11/27/18 0411  NA 145 147*  K 4.1 4.0  CL 115* 118*  CO2 19* 17*  GLUCOSE 420* 405*  BUN 81* 86*  CREATININE 2.25* 2.21*  CALCIUM 8.2* 8.0*  MG 2.6*  --     Cardiac Enzymes No results for input(s): TROPONINI in the last 168 hours. RADIOLOGY:  Dg Abd 1 View  Result Date: 11/26/2018 CLINICAL DATA:  ET and NG tube placement. CXR approved by MD at bedside. MD states no repeat necessary. Best attainable images due to patient conditions. EXAM: ABDOMEN - 1 VIEW COMPARISON:  Abdominal radiograph 11/19/2018 FINDINGS: The nasogastric tube distal aspect is coiled overlying the stomach. Paucity of bowel gas. No supine evidence for free intraperitoneal air. Left retrocardiac opacity and probable small effusion. Probable right basilar atelectasis. IMPRESSION: The nasogastric tube side port projects over the stomach. Electronically Signed   By: Audie Pinto M.D.   On: 11/26/2018 16:48   US Renal  Result Date: 11/25/2018 CLINICAL DATA:  Acute renal failure. EXAM: RENAL / URINARY TRACT ULTRASOUND COMPLETE COMPARISON:  Body CT November 17, 2018 FINDINGS: Right Kidney: Renal measurements: 12.2 x 6.9 x 7.5 cm = volume: 334 mL . Echogenicity within normal limits. No mass or hydronephrosis visualized. Left Kidney: Renal measurements: 14.9 x 6.5 x 6.7 cm = volume: 341 mL. Echogenicity within normal limits. No mass or hydronephrosis visualized. Bladder: Decompressed around urinary Foley and therefore not well evaluated. IMPRESSION: Normal appearance of the kidneys, accounting for the technical limitations of the study caused by body habitus. Electronically Signed   By: Fidela Salisbury M.D.   On: 11/25/2018 15:43   Dg Chest Port 1 View  Result Date: 11/26/2018 CLINICAL DATA:  ET and NG tube placement. CXR approved by MD at bedside. MD states no repeat necessary. Best attainable images  due to patient conditions. EXAM: PORTABLE CHEST 1 VIEW COMPARISON:  Chest radiograph 11/24/2018, 11/22/2018 FINDINGS: Endotracheal tube tip terminates between the thoracic inlet and carina. The nasogastric tube courses below the diaphragm with nonvisualization of the distal tip. The right  central venous catheter tip projects over the SVC. Cardiomegaly. No definite pneumothorax. Poor visualization of the bilateral lung bases but suspect persistent bibasilar opacities. No acute finding in the visualized skeleton. IMPRESSION: 1. Endotracheal tube tip terminates between the thoracic inlet and carina. 2. Nasogastric tube positioning is better seen on the subsequent abdominal radiograph. 3. Poor visualization of the lung bases but suspect ongoing bibasilar pulmonary opacities. Electronically Signed   By: Audie Pinto M.D.   On: 11/26/2018 16:44   ASSESSMENT AND PLAN:  [name]  is a 58 y.o. male with a known history of atrial fibrillation on Coumadin, CHF, diabetes mellitus, and obstructive sleep apnea with CPAP use at home, morbid obesity with sedentary lifestyle on oxygen at 2 L/min at home.  1.Acute on chronic  respiratory failure with severe hypoxia-ARDS with pneumonia -No clinical improvement, unsuccessful weaning trials - Extubated on 8/31 but required reintubation, vent management per PCCM team.  -patient underwent bronchoscopy 11/22/2018. 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 -full vent support with hundred percent of FI02, PEEP 15 --- extremely poor prognosis with high mortality and morbidity -Continue tube feeds with pro-stat -On Vanco and IV steroids -Precedex and Norcuron as needed for agitation  -Septic not present at the time of admission from pneumonia with ARDS Continue IV antibiotics Goal is to keep map greater than or equal to 65 IV steroids  #.Obstructive sleep apnea-- chronic  #Acute on chronic diastolic CHF, EF 55 to [id number] on echo in 2014 at [facility] -Repeat echocardiogram shows EF of 55 to 60%  # History of atrial fibrillation -Coumadin on hold and patient is on heparin drip, amiodarone drip and Cardizem drip  #. Acute renal failure on chronic kidney disease stage III  with baseline creatinine 1.28-1.89-1.87-2.25-2.21 -Repeat BMP in the  a.m. and continue to monitor renal function closely -Nephrology consult placed and Dr. Zollie Scale is following -No indication for hemodialysis at this point as patient has good urine output of 2.8 L -Renal ultrasound if no clinical improvement  #  Type IIDiabetes mellitus -Sliding scale insulin -Hemoglobin A1c 7.3  # Generalized weakness -Physical therapy eval once extubated  #.  Abdominal distention -No acute pathology on CT scan or KUB performed  -Could be ileus, NG tube in place  #Adult failure to thrive Palliative care team is involved and talking to patient's sister Ms. [name] she is in denial, does not think patient is on life support and believes that patient is clinically improving.  Follow-up with palliative care   Overall poor prognosis. Appreciate palliative care input.  Discussed with intensivist Dr. Patsey Berthold  CODE STATUS: DNR  DVT Prophylaxis: Lovenox  TOTAL TIME TAKING CARE OF THIS PATIENT:33 minutes.  >50% time spent on counselling and coordination of care  POSSIBLE D/C IN *?* DAYS, DEPENDING ON CLINICAL CONDITION.  Note: This dictation was prepared with Dragon dictation along with smaller phrase technology. Any transcriptional errors that result from this process are unintentional. No friends or family members at bedside  Nicholes Mango M.D on 11/27/2018 at 11:32 AM  Between 7am to 6pm - Pager - [phone]  After 6pm go to [email or web address] - password [email or web address] [facility]  Office  [phone]  CC: Primary care physician; [facility]  ID: Cleatis Polka, male   DOB: 1960/10/03, 58 y.o.   MRN: [id number]

## 2018-11-27 NOTE — Progress Notes (Signed)
[facility]  ROUNDING NOTE   Subjective:  Critically ill. Intubated, sedated.  Tmax 103.9  Creatinine 2.21 (2.25) UOP 2693m.   Objective:  Vital signs in last 24 hours:  Temp:  [101.3 F (38.5 C)-103.9 F (39.9 C)] 103.4 F (39.7 C) (09/08 0400) Pulse Rate:  [28-121] 113 (09/08 0800) Resp:  [15-22] 15 (09/08 0800) BP: (92-156)/(67-141) 111/77 (09/08 0800) SpO2:  [92 %-98 %] 95 % (09/08 0800) FiO2 (%):  [50 %-100 %] 60 % (09/08 0745) Weight:  [213.6 kg] 213.6 kg (09/08 0456)  Weight change: 2.722 kg Filed Weights   11/25/18 0446 11/26/18 0312 11/27/18 0456  Weight: (!) 195 kg (!) 210.9 kg (!) 213.6 kg    Intake/Output: I/O last 3 completed shifts: In: 6482.2 [I.V.:4389.1; NG/GT:1085.5; IV Piggyback:1007.6] Out: 39449[Urine:3675]   Intake/Output this shift:  No intake/output data recorded.  Physical Exam: General: Critically ill   Head: ETT  Eyes: Closed  Neck: Obese  Lungs:  Diminished bilaterally. Pressure Control FiO2 60%  Heart: irregular  Abdomen:  obese  Extremities: + peripheral edema.  Neurologic: Intubated, sedated  Skin: No lesions       Basic Metabolic Panel: Recent Labs  Lab 11/22/18 0429 11/22/18 2205  11/23/18 1320 11/24/18 0410 11/25/18 1250 11/26/18 0455 11/27/18 0411  NA 141  --    < > 144 145 145 145 147*  K 4.7 5.5*   < > 5.4* 4.6 4.1 4.1 4.0  CL 103  --    < > 105 105 112* 115* 118*  CO2 28  --    < > 30 28 20* 19* 17*  GLUCOSE 304*  --    < > 311* 276* 377* 420* 405*  BUN 65*  --    < > 74* 77* 79* 81* 86*  CREATININE 1.51*  --    < > 1.89* 1.87* 2.26* 2.25* 2.21*  CALCIUM 8.7*  --    < > 8.5* 8.4* 8.5* 8.2* 8.0*  MG 3.1* 3.3*  --   --  2.7*  --  2.6*  --   PHOS 5.3*  --   --   --  3.3 3.5 4.2  --    < > = values in this interval not displayed.    Liver Function Tests: Recent Labs  Lab 11/25/18 1250  ALBUMIN 2.6*   No results for input(s): LIPASE, AMYLASE in the last 168 hours. No results for input(s):  AMMONIA in the last 168 hours.  CBC: Recent Labs  Lab 11/22/18 0429 11/23/18 0447 11/24/18 0410 11/25/18 0445 11/25/18 0958 11/26/18 0455 11/27/18 0411  WBC 7.2 7.5 10.3 8.7 10.2 8.9 12.5*  NEUTROABS 6.3 6.5 8.8*  --   --   --   --   HGB 14.9 13.8 14.9 14.6 14.7 13.8 13.7  HCT 46.4 44.9 47.2 46.1 46.1 43.8 43.2  MCV 93.5 97.4 96.1 92.9 93.1 94.4 94.1  PLT 156 140* 139* 127* 124* 107* 89*    Cardiac Enzymes: No results for input(s): CKTOTAL, CKMB, CKMBINDEX, TROPONINI in the last 168 hours.  BNP: Invalid input(s): POCBNP  CBG: Recent Labs  Lab 11/26/18 2004 11/26/18 2351 11/27/18 0405 11/27/18 0724 11/27/18 1123  GLUCAP 374* 345* 391* 366* 354*    Microbiology: Results for orders placed or performed during the hospital encounter of 10/25/2018  SARS CORONAVIRUS 2 (TAT 6-12 HRS) Nasal Swab Aptima Multi Swab     Status: None   Collection Time: 11/19/2018  4:55 PM   Specimen: Aptima Multi  Swab; Nasal Swab  Result Value Ref Range Status   SARS Coronavirus 2 NEGATIVE NEGATIVE Final    Comment: (NOTE) SARS-CoV-2 target nucleic acids are NOT DETECTED. The SARS-CoV-2 RNA is generally detectable in upper and lower respiratory specimens during the acute phase of infection. Negative results do not preclude SARS-CoV-2 infection, do not rule out co-infections with other pathogens, and should not be used as the sole basis for treatment or other patient management decisions. Negative results must be combined with clinical observations, patient history, and epidemiological information. The expected result is Negative. Fact Sheet for Patients: SugarRoll.be Fact Sheet for Healthcare Providers: https://www.woods-mathews.com/ This test is not yet approved or cleared by the [place] FDA and  has been authorized for detection and/or diagnosis of SARS-CoV-2 by FDA under an Emergency Use Authorization (EUA). This EUA will remain  in effect  (meaning this test can be used) for the duration of the COVID-19 declaration under Section 56 4(b)(1) of the Act, 21 U.S.C. section 360bbb-3(b)(1), unless the authorization is terminated or revoked sooner. Performed at [facility], [place] [address] [place], [place] [address]   SARS Coronavirus 2 [facility] order, Performed in [facility] hospital lab) Nasopharyngeal Nasopharyngeal Swab     Status: None   Collection Time: 11/11/2018 10:34 PM   Specimen: Nasopharyngeal Swab  Result Value Ref Range Status   SARS Coronavirus 2 NEGATIVE NEGATIVE Final    Comment: (NOTE) If result is NEGATIVE SARS-CoV-2 target nucleic acids are NOT DETECTED. The SARS-CoV-2 RNA is generally detectable in upper and lower  respiratory specimens during the acute phase of infection. The lowest  concentration of SARS-CoV-2 viral copies this assay can detect is 250  copies / mL. A negative result does not preclude SARS-CoV-2 infection  and should not be used as the sole basis for treatment or other  patient management decisions.  A negative result may occur with  improper specimen collection / handling, submission of specimen other  than nasopharyngeal swab, presence of viral mutation(s) within the  areas targeted by this assay, and inadequate number of viral copies  (<250 copies / mL). A negative result must be combined with clinical  observations, patient history, and epidemiological information. If result is POSITIVE SARS-CoV-2 target nucleic acids are DETECTED. The SARS-CoV-2 RNA is generally detectable in upper and lower  respiratory specimens dur ing the acute phase of infection.  Positive  results are indicative of active infection with SARS-CoV-2.  Clinical  correlation with patient history and other diagnostic information is  necessary to determine patient infection status.  Positive results do  not rule out bacterial infection or co-infection with other viruses. If result is PRESUMPTIVE  POSTIVE SARS-CoV-2 nucleic acids MAY BE PRESENT.   A presumptive positive result was obtained on the submitted specimen  and confirmed on repeat testing.  While 2019 novel coronavirus  (SARS-CoV-2) nucleic acids may be present in the submitted sample  additional confirmatory testing may be necessary for epidemiological  and / or clinical management purposes  to differentiate between  SARS-CoV-2 and other Sarbecovirus currently known to infect humans.  If clinically indicated additional testing with an alternate test  methodology [phone]) is advised. The SARS-CoV-2 RNA is generally  detectable in upper and lower respiratory sp ecimens during the acute  phase of infection. The expected result is Negative. Fact Sheet for Patients:  StrictlyIdeas.no Fact Sheet for Healthcare Providers: BankingDealers.co.za This test is not yet approved or cleared by the [place] FDA and has been authorized for  detection and/or diagnosis of SARS-CoV-2 by FDA under an Emergency Use Authorization (EUA).  This EUA will remain in effect (meaning this test can be used) for the duration of the COVID-19 declaration under Section 564(b)(1) of the Act, 21 U.S.C. section 360bbb-3(b)(1), unless the authorization is terminated or revoked sooner. Performed at [facility], [address] [place], [place] [address]   Culture, respiratory (non-expectorated)     Status: None   Collection Time: 11/10/2018 10:34 PM   Specimen: Tracheal Aspirate; Respiratory  Result Value Ref Range Status   Specimen Description   Final    TRACHEAL ASPIRATE Performed at [facility], [address] [place], [place] [address]    Special Requests   Final    NONE Performed at [facility], [address] [place], [place] [address]    Gram Stain   Final    RARE WBC PRESENT, PREDOMINANTLY PMN ABUNDANT GRAM POSITIVE COCCI IN CHAINS    Culture   Final     Consistent with normal respiratory flora. Performed at [facility], [place] [address], [place], [place] [address]    Report Status 11/19/2018 FINAL  Final  Urine Culture     Status: None   Collection Time: 10/30/2018 11:12 PM   Specimen: Urine, Random  Result Value Ref Range Status   Specimen Description   Final    URINE, RANDOM Performed at [facility], [address], [place], [place] [address]    Special Requests   Final    NONE Performed at [facility], [address], [place], [place] [address]    Culture   Final    NO GROWTH Performed at [facility], [place] [address], [place], [place] [address]    Report Status 11/18/2018 FINAL  Final  MRSA PCR Screening     Status: None   Collection Time: 10/21/2018 11:28 PM   Specimen: Nasopharyngeal  Result Value Ref Range Status   MRSA by PCR NEGATIVE NEGATIVE Final    Comment:        The GeneXpert MRSA Assay (FDA approved for NASAL specimens only), is one component of a comprehensive MRSA colonization surveillance program. It is not intended to diagnose MRSA infection nor to guide or monitor treatment for MRSA infections. Performed at [facility], [address] [place], [place] [address]   Culture, blood (Routine X 2) w Reflex to ID Panel     Status: None   Collection Time: 11/17/18 12:19 AM   Specimen: BLOOD  Result Value Ref Range Status   Specimen Description BLOOD RIGHT ANTECUBITAL  Final   Special Requests   Final    BOTTLES DRAWN AEROBIC AND ANAEROBIC Blood Culture adequate volume   Culture   Final    NO GROWTH 5 DAYS Performed at [facility], [address], [place], [place] [address]    Report Status 11/22/2018 FINAL  Final  Culture, blood (Routine X 2) w Reflex to ID Panel     Status: None   Collection Time: 11/17/18 12:31 AM   Specimen: BLOOD  Result Value Ref Range Status   Specimen Description BLOOD CENTRAL LINE MIDLINE  Final   Special Requests   Final     BOTTLES DRAWN AEROBIC AND ANAEROBIC Blood Culture adequate volume   Culture   Final    NO GROWTH 5 DAYS Performed at [facility], [address] [place],  [address]    Report Status 11/22/2018 FINAL  Final  Culture, bal-quantitative     Status: Abnormal   Collection Time: 11/22/18  7:48 AM   Specimen: Bronchoalveolar Lavage; Respiratory  Result Value Ref Range Status   Specimen Description   Final    BRONCHIAL ALVEOLAR LAVAGE Performed at [facility], [address] [place], [place] [address]    Special Requests   Final    Immunocompromised Performed at [facility], [place], [place] [address]    Gram Stain   Final    FEW WBC PRESENT, PREDOMINANTLY PMN FEW GRAM POSITIVE COCCI IN PAIRS IN CLUSTERS FEW GRAM POSITIVE RODS Performed at [facility], [place] [address] [place], [place] [address]    Culture (A)  Final    >=100,000 COLONIES/mL STAPHYLOCOCCUS SIMULANS >=100,000 COLONIES/mL STREPTOCOCCUS MITIS/ORALIS    Report Status 11/25/2018 FINAL  Final   Organism ID, Bacteria STAPHYLOCOCCUS SIMULANS (A)  Final   Organism ID, Bacteria STREPTOCOCCUS MITIS/ORALIS (A)  Final      Susceptibility   Staphylococcus simulans - MIC*    CIPROFLOXACIN <=0.5 SENSITIVE Sensitive     ERYTHROMYCIN <=0.25 SENSITIVE Sensitive     GENTAMICIN <=0.5 SENSITIVE Sensitive     OXACILLIN <=0.25 SENSITIVE Sensitive     TETRACYCLINE >=16 RESISTANT Resistant     VANCOMYCIN <=0.5 SENSITIVE Sensitive     TRIMETH/SULFA <=10 SENSITIVE Sensitive     CLINDAMYCIN <=0.25 SENSITIVE Sensitive     RIFAMPIN <=0.5 SENSITIVE Sensitive     Inducible Clindamycin NEGATIVE Sensitive     * >=100,000 COLONIES/mL STAPHYLOCOCCUS SIMULANS   Streptococcus mitis/oralis - MIC*    TETRACYCLINE >=16 RESISTANT Resistant     VANCOMYCIN 0.25 SENSITIVE Sensitive     CLINDAMYCIN 0.5 INTERMEDIATE Intermediate     * >=100,000 COLONIES/mL STREPTOCOCCUS MITIS/ORALIS  CULTURE,  BLOOD (ROUTINE X 2) w Reflex to ID Panel     Status: None (Preliminary result)   Collection Time: 11/26/18 10:32 PM   Specimen: BLOOD  Result Value Ref Range Status   Specimen Description BLOOD RIGHT ANTECUBITAL  Final   Special Requests   Final    BOTTLES DRAWN AEROBIC AND ANAEROBIC Blood Culture adequate volume   Culture   Final    NO GROWTH < 12 HOURS Performed at [facility], [address] [place], [place] [address]    Report Status PENDING  Incomplete  CULTURE, BLOOD (ROUTINE X 2) w Reflex to ID Panel     Status: None (Preliminary result)   Collection Time: 11/26/18 10:32 PM   Specimen: BLOOD  Result Value Ref Range Status   Specimen Description BLOOD BLOOD RIGHT HAND  Final   Special Requests   Final    BOTTLES DRAWN AEROBIC AND ANAEROBIC Blood Culture adequate volume   Culture   Final    NO GROWTH < 12 HOURS Performed at [facility], [address] [place], [place] [address]    Report Status PENDING  Incomplete    Coagulation Studies: No results for input(s): LABPROT, INR in the last 72 hours.  Urinalysis: No results for input(s): COLORURINE, LABSPEC, PHURINE, GLUCOSEU, HGBUR, BILIRUBINUR, KETONESUR, PROTEINUR, UROBILINOGEN, NITRITE, LEUKOCYTESUR in the last 72 hours.  Invalid input(s): APPERANCEUR    Imaging: Dg Abd 1 View  Result Date: 11/26/2018 CLINICAL DATA:  ET and NG tube placement. CXR approved by MD at bedside. MD states no repeat necessary. Best attainable images due to patient conditions. EXAM: ABDOMEN - 1 VIEW COMPARISON:  Abdominal radiograph 11/19/2018 FINDINGS: The nasogastric tube distal aspect is coiled overlying  the stomach. Paucity of bowel gas. No supine evidence for free intraperitoneal air. Left retrocardiac opacity and probable small effusion. Probable right basilar atelectasis. IMPRESSION: The nasogastric tube side port projects over the stomach. Electronically Signed   By: Audie Pinto M.D.   On: 11/26/2018 16:48    US Renal  Result Date: 11/25/2018 CLINICAL DATA:  Acute renal failure. EXAM: RENAL / URINARY TRACT ULTRASOUND COMPLETE COMPARISON:  Body CT November 17, 2018 FINDINGS: Right Kidney: Renal measurements: 12.2 x 6.9 x 7.5 cm = volume: 334 mL . Echogenicity within normal limits. No mass or hydronephrosis visualized. Left Kidney: Renal measurements: 14.9 x 6.5 x 6.7 cm = volume: 341 mL. Echogenicity within normal limits. No mass or hydronephrosis visualized. Bladder: Decompressed around urinary Foley and therefore not well evaluated. IMPRESSION: Normal appearance of the kidneys, accounting for the technical limitations of the study caused by body habitus. Electronically Signed   By: Fidela Salisbury M.D.   On: 11/25/2018 15:43   Dg Chest Port 1 View  Result Date: 11/26/2018 CLINICAL DATA:  ET and NG tube placement. CXR approved by MD at bedside. MD states no repeat necessary. Best attainable images due to patient conditions. EXAM: PORTABLE CHEST 1 VIEW COMPARISON:  Chest radiograph 11/24/2018, 11/22/2018 FINDINGS: Endotracheal tube tip terminates between the thoracic inlet and carina. The nasogastric tube courses below the diaphragm with nonvisualization of the distal tip. The right central venous catheter tip projects over the SVC. Cardiomegaly. No definite pneumothorax. Poor visualization of the bilateral lung bases but suspect persistent bibasilar opacities. No acute finding in the visualized skeleton. IMPRESSION: 1. Endotracheal tube tip terminates between the thoracic inlet and carina. 2. Nasogastric tube positioning is better seen on the subsequent abdominal radiograph. 3. Poor visualization of the lung bases but suspect ongoing bibasilar pulmonary opacities. Electronically Signed   By: Audie Pinto M.D.   On: 11/26/2018 16:44     Medications:   . amiodarone 60 mg/hr (11/27/18 0717)  . dexmedetomidine (PRECEDEX) IV infusion 0.8 mcg/kg/hr (11/27/18 1100)  . feeding supplement (VITAL HIGH  PROTEIN) 45 mL/hr at 11/27/18 1143  . heparin 650 Units/hr (11/27/18 0656)  . HYDROmorphone 2 mg/hr (11/27/18 0309)   . allopurinol  100 mg Per Tube Daily  . aspirin  81 mg Per NG tube Daily  . bisacodyl  10 mg Rectal Once  . chlorhexidine gluconate (MEDLINE KIT)  15 mL Mouth Rinse BID  . Chlorhexidine Gluconate Cloth  6 each Topical Daily  . clonazePAM  0.25 mg Per Tube QHS  . feeding supplement (PRO-STAT SUGAR FREE 64)  60 mL Per Tube TID  . free water  200 mL Per Tube Q4H  . insulin aspart  0-20 Units Subcutaneous Q4H  . insulin aspart  6 Units Subcutaneous Q4H  . insulin detemir  10 Units Subcutaneous BID  . ipratropium-albuterol  3 mL Nebulization Q6H  . mouth rinse  15 mL Mouth Rinse 10 times per day  . methylPREDNISolone (SOLU-MEDROL) injection  40 mg Intravenous BID  . metoprolol tartrate  50 mg Per NG tube BID  . multivitamin  15 mL Per Tube Daily  . pantoprazole sodium  40 mg Per Tube QHS  . polyethylene glycol  17 g Per Tube Daily  . pravastatin  80 mg Per NG tube QPM  . QUEtiapine  25 mg Per Tube QHS  . senna-docusate  2 tablet Per Tube BID  . sodium chloride flush  10-40 mL Intracatheter Q12H  . vancomycin variable dose per unstable renal  function (pharmacist dosing)   Does not apply See admin instructions   acetaminophen, bisacodyl, metoprolol tartrate, midazolam, ondansetron (ZOFRAN) IV, sodium chloride flush, sodium chloride flush, vecuronium  Assessment/ Plan:   Kyle White is a 58 y.o. white male with atrial fibrillation on warfarin, congestive heart failure, diabetes mellitus type 2, obstructive sleep apnea, morbid obesity, gout, mitral valve replacement who was admitted to [facility] on 10/20/2018 for acute exacerbation of COPD.  1. Acute renal failure on chronic kidney disease stage III:   1.  Acute renal failure with metabolic acidosis on chronic kidney disease stage III: creatinine on admission of 1.31, GFR of 53.   Baseline creatinine appears to be 1.3  with an EGFR 57.  Urinalysis with proteinuria. Acute renal failure secondary to acute cardiorenal syndrome.  Nonoliguric urine output.  No indication for dialysis  2.  Acute respiratory failure: requiring mechanical ventilation.   3. Hypotension: off vasopressors.   4. Diastolic congestive heart failure: acute exacerbation.    LOS: [place] 9/8/202012:18 PM

## 2018-11-27 NOTE — Progress Notes (Addendum)
Follow up - Critical Care Medicine Note  Patient Details:    Kyle White is an 58 y.o. male  w/hx of morbid obesity, HFpEF hx MVR, CHF, PAF, OSA, DM, AHRF came in with acute hypoxemic hypercarbic resp failure with CXR showing b/l pl effusions and pulm edema. CT with restrictive physiology, pulm edema, bilateral pleural effusions and compressive atelectasiss/p extubation on BiPAP. Re-intubated 8/31. Severe hypoxia requiring high PEEP levels.  Lines, Airways, Drains: Airway 8.5 mm (Active)  Secured at (cm) 24 cm 11/27/18 2043  Measured From Lips 11/27/18 2043  Secured Location Right 11/27/18 2043  Secured By [employer] 11/27/18 2043  Tube Holder Repositioned Yes 11/27/18 2043  Cuff Pressure (cm H2O) 28 cm H2O 11/27/18 2043  Site Condition Dry 11/27/18 2043     CVC Triple Lumen 11/22/18 Right Internal jugular (Active)  Indication for Insertion or Continuance of Line Administration of hyperosmolar/irritating solutions (i.e. TPN, Vancomycin, etc.) 11/27/18 2000  Site Assessment Clean;Dry;Intact 11/27/18 2000  Proximal Lumen Status Infusing;Blood return noted 11/27/18 2000  Medial Lumen Status Infusing;Blood return noted 11/27/18 2000  Distal Lumen Status Blood return noted;In-line blood sampling system in place 11/27/18 2000  Dressing Type Transparent;Occlusive 11/27/18 2000  Dressing Status Clean;Dry;Intact;Antimicrobial disc in place 11/27/18 [employer] checked and tightened 11/27/18 2000  Dressing Intervention Dressing reinforced 11/24/18 0727  Dressing Change Due 11/29/18 11/27/18 2000     NG/OG Tube Orogastric Center mouth Xray 78 cm (Active)  Cm Marking at Nare/Corner of Mouth (if applicable) 78 cm 24/26/83 1930  Site Assessment Clean;Dry;Intact 11/27/18 1930  Ongoing Placement Verification No change in cm markings or external length of tube from initial placement;No change in respiratory status;No acute changes, not attributed to clinical condition  11/27/18 1930  Status Infusing tube feed 11/27/18 1930  Amount of suction 120 mmHg 11/22/18 0400  Drainage Appearance Green;Brown 11/22/18 0400  Intake (mL) 40 mL 11/27/18 1742  Output (mL) 100 mL 11/21/18 1200     Urethral Catheter [name], RN Double-lumen 16 Fr. (Active)  Indication for Insertion or Continuance of Catheter Unstable critically ill patients first 24-48 hours (See Criteria) 11/27/18 1930  Site Assessment Clean;Intact 11/27/18 1930  Catheter Maintenance Bag below level of bladder;Catheter secured;Drainage bag/tubing not touching floor;Insertion date on drainage bag;No dependent loops;Seal intact;Bag emptied prior to transport 11/27/18 1930  Collection Container Standard drainage bag 11/27/18 1930  Securement Method Leg strap 11/27/18 1930  Urinary Catheter Interventions (if applicable) Unclamped 41/96/22 1930  Output (mL) 1125 mL 11/27/18 1742    Anti-infectives:  Anti-infectives (From admission, onward)   Start     Dose/Rate Route Frequency Ordered Stop   11/27/18 1500  vancomycin (VANCOCIN) 1,750 mg in sodium chloride 0.9 % 500 mL IVPB     1,750 mg 250 mL/hr over 120 Minutes Intravenous Every 24 hours 11/27/18 1355     11/26/18 1100  vancomycin (VANCOCIN) 2,500 mg in sodium chloride 0.9 % 500 mL IVPB     2,500 mg 250 mL/hr over 120 Minutes Intravenous  Once 11/26/18 1040 11/26/18 1315   11/26/18 1058  vancomycin variable dose per unstable renal function (pharmacist dosing)  Status:  Discontinued      Does not apply See admin instructions 11/26/18 1058 11/27/18 1658   11/22/18 1015  ceFEPIme (MAXIPIME) 2 g in sodium chloride 0.9 % 100 mL IVPB  Status:  Discontinued     2 g 200 mL/hr over 30 Minutes Intravenous Every 8 hours 11/22/18 1009 11/27/18 1027  Microbiology: Results for orders placed or performed during the hospital encounter of 10/30/2018  SARS CORONAVIRUS 2 (TAT 6-12 HRS) Nasal Swab Aptima Multi Swab     Status: None   Collection Time:  11/07/2018  4:55 PM   Specimen: Aptima Multi Swab; Nasal Swab  Result Value Ref Range Status   SARS Coronavirus 2 NEGATIVE NEGATIVE Final    Comment: (NOTE) SARS-CoV-2 target nucleic acids are NOT DETECTED. The SARS-CoV-2 RNA is generally detectable in upper and lower respiratory specimens during the acute phase of infection. Negative results do not preclude SARS-CoV-2 infection, do not rule out co-infections with other pathogens, and should not be used as the sole basis for treatment or other patient management decisions. Negative results must be combined with clinical observations, patient history, and epidemiological information. The expected result is Negative. Fact Sheet for Patients: SugarRoll.be Fact Sheet for Healthcare Providers: https://www.woods-mathews.com/ This test is not yet approved or cleared by the [place] FDA and  has been authorized for detection and/or diagnosis of SARS-CoV-2 by FDA under an Emergency Use Authorization (EUA). This EUA will remain  in effect (meaning this test can be used) for the duration of the COVID-19 declaration under Section 56 4(b)(1) of the Act, 21 U.S.C. section 360bbb-3(b)(1), unless the authorization is terminated or revoked sooner. Performed at [facility], [place] [address], [place], [place] [address]   SARS Coronavirus 2 [facility] order, Performed in [facility] hospital lab) Nasopharyngeal Nasopharyngeal Swab     Status: None   Collection Time: 10/24/2018 10:34 PM   Specimen: Nasopharyngeal Swab  Result Value Ref Range Status   SARS Coronavirus 2 NEGATIVE NEGATIVE Final    Comment: (NOTE) If result is NEGATIVE SARS-CoV-2 target nucleic acids are NOT DETECTED. The SARS-CoV-2 RNA is generally detectable in upper and lower  respiratory specimens during the acute phase of infection. The lowest  concentration of SARS-CoV-2 viral copies this assay can detect is 250  copies / mL. A  negative result does not preclude SARS-CoV-2 infection  and should not be used as the sole basis for treatment or other  patient management decisions.  A negative result may occur with  improper specimen collection / handling, submission of specimen other  than nasopharyngeal swab, presence of viral mutation(s) within the  areas targeted by this assay, and inadequate number of viral copies  (<250 copies / mL). A negative result must be combined with clinical  observations, patient history, and epidemiological information. If result is POSITIVE SARS-CoV-2 target nucleic acids are DETECTED. The SARS-CoV-2 RNA is generally detectable in upper and lower  respiratory specimens dur ing the acute phase of infection.  Positive  results are indicative of active infection with SARS-CoV-2.  Clinical  correlation with patient history and other diagnostic information is  necessary to determine patient infection status.  Positive results do  not rule out bacterial infection or co-infection with other viruses. If result is PRESUMPTIVE POSTIVE SARS-CoV-2 nucleic acids MAY BE PRESENT.   A presumptive positive result was obtained on the submitted specimen  and confirmed on repeat testing.  While 2019 novel coronavirus  (SARS-CoV-2) nucleic acids may be present in the submitted sample  additional confirmatory testing may be necessary for epidemiological  and / or clinical management purposes  to differentiate between  SARS-CoV-2 and other Sarbecovirus currently known to infect humans.  If clinically indicated additional testing with an alternate test  methodology [phone]) is advised. The SARS-CoV-2 RNA is generally  detectable in upper and lower respiratory  sp ecimens during the acute  phase of infection. The expected result is Negative. Fact Sheet for Patients:  StrictlyIdeas.no Fact Sheet for Healthcare Providers: BankingDealers.co.za This test is not  yet approved or cleared by the [place] FDA and has been authorized for detection and/or diagnosis of SARS-CoV-2 by FDA under an Emergency Use Authorization (EUA).  This EUA will remain in effect (meaning this test can be used) for the duration of the COVID-19 declaration under Section 564(b)(1) of the Act, 21 U.S.C. section 360bbb-3(b)(1), unless the authorization is terminated or revoked sooner. Performed at [facility], [address] [place], [place] [address]   Culture, respiratory (non-expectorated)     Status: None   Collection Time: 11/18/2018 10:34 PM   Specimen: Tracheal Aspirate; Respiratory  Result Value Ref Range Status   Specimen Description   Final    TRACHEAL ASPIRATE Performed at [facility], [address] [place], [place] [address]    Special Requests   Final    NONE Performed at [facility], [address] [place], [place] [address]    Gram Stain   Final    RARE WBC PRESENT, PREDOMINANTLY PMN ABUNDANT GRAM POSITIVE COCCI IN CHAINS    Culture   Final    Consistent with normal respiratory flora. Performed at [facility], [place] [address] [place], [place] [address]    Report Status 11/19/2018 FINAL  Final  Urine Culture     Status: None   Collection Time: 10/25/2018 11:12 PM   Specimen: Urine, Random  Result Value Ref Range Status   Specimen Description   Final    URINE, RANDOM Performed at [facility], [address] [place], [place] [address]    Special Requests   Final    NONE Performed at [facility], [address] [place] [address] [place], [place] [address]    Culture   Final    NO GROWTH Performed at [facility], [place] [address], [place], [place] [address]    Report Status 11/18/2018 FINAL  Final  MRSA PCR Screening     Status: None   Collection Time: 10/25/2018 11:28 PM   Specimen: Nasopharyngeal  Result Value Ref Range Status   MRSA by PCR NEGATIVE NEGATIVE Final    Comment:         The GeneXpert MRSA Assay (FDA approved for NASAL specimens only), is one component of a comprehensive MRSA colonization surveillance program. It is not intended to diagnose MRSA infection nor to guide or monitor treatment for MRSA infections. Performed at [facility], [address] [place], [place] [address]   Culture, blood (Routine X 2) w Reflex to ID Panel     Status: None   Collection Time: 11/17/18 12:19 AM   Specimen: BLOOD  Result Value Ref Range Status   Specimen Description BLOOD RIGHT ANTECUBITAL  Final   Special Requests   Final    BOTTLES DRAWN AEROBIC AND ANAEROBIC Blood Culture adequate volume   Culture   Final    NO GROWTH 5 DAYS Performed at [facility], [address] [place], [place] [address]    Report Status 11/22/2018 FINAL  Final  Culture, blood (Routine X 2) w Reflex to ID Panel     Status: None   Collection Time: 11/17/18 12:31 AM   Specimen: BLOOD  Result Value Ref Range Status   Specimen Description BLOOD CENTRAL LINE MIDLINE  Final   Special Requests   Final  BOTTLES DRAWN AEROBIC AND ANAEROBIC Blood Culture adequate volume   Culture   Final    NO GROWTH 5 DAYS Performed at [facility], [address] [place], [place] [address]    Report Status 11/22/2018 FINAL  Final  Culture, bal-quantitative     Status: Abnormal   Collection Time: 11/22/18  7:48 AM   Specimen: Bronchoalveolar Lavage; Respiratory  Result Value Ref Range Status   Specimen Description   Final    BRONCHIAL ALVEOLAR LAVAGE Performed at [facility], [address] [place], [place] [address]    Special Requests   Final    Immunocompromised Performed at [facility], [place], [place] [address]    Gram Stain   Final    FEW WBC PRESENT, PREDOMINANTLY PMN FEW GRAM POSITIVE COCCI IN PAIRS IN CLUSTERS FEW GRAM POSITIVE RODS Performed at [facility], [place] [address], [place], [place] [address]     Culture (A)  Final    >=100,000 COLONIES/mL STAPHYLOCOCCUS SIMULANS >=100,000 COLONIES/mL STREPTOCOCCUS MITIS/ORALIS    Report Status 11/25/2018 FINAL  Final   Organism ID, Bacteria STAPHYLOCOCCUS SIMULANS (A)  Final   Organism ID, Bacteria STREPTOCOCCUS MITIS/ORALIS (A)  Final      Susceptibility   Staphylococcus simulans - MIC*    CIPROFLOXACIN <=0.5 SENSITIVE Sensitive     ERYTHROMYCIN <=0.25 SENSITIVE Sensitive     GENTAMICIN <=0.5 SENSITIVE Sensitive     OXACILLIN <=0.25 SENSITIVE Sensitive     TETRACYCLINE >=16 RESISTANT Resistant     VANCOMYCIN <=0.5 SENSITIVE Sensitive     TRIMETH/SULFA <=10 SENSITIVE Sensitive     CLINDAMYCIN <=0.25 SENSITIVE Sensitive     RIFAMPIN <=0.5 SENSITIVE Sensitive     Inducible Clindamycin NEGATIVE Sensitive     * >=100,000 COLONIES/mL STAPHYLOCOCCUS SIMULANS   Streptococcus mitis/oralis - MIC*    TETRACYCLINE >=16 RESISTANT Resistant     VANCOMYCIN 0.25 SENSITIVE Sensitive     CLINDAMYCIN 0.5 INTERMEDIATE Intermediate     * >=100,000 COLONIES/mL STREPTOCOCCUS MITIS/ORALIS  CULTURE, BLOOD (ROUTINE X 2) w Reflex to ID Panel     Status: None (Preliminary result)   Collection Time: 11/26/18 10:32 PM   Specimen: BLOOD  Result Value Ref Range Status   Specimen Description BLOOD RIGHT ANTECUBITAL  Final   Special Requests   Final    BOTTLES DRAWN AEROBIC AND ANAEROBIC Blood Culture adequate volume   Culture   Final    NO GROWTH < 12 HOURS Performed at [facility], [address] [place], [place] [address]    Report Status PENDING  Incomplete  CULTURE, BLOOD (ROUTINE X 2) w Reflex to ID Panel     Status: None (Preliminary result)   Collection Time: 11/26/18 10:32 PM   Specimen: BLOOD  Result Value Ref Range Status   Specimen Description BLOOD BLOOD RIGHT HAND  Final   Special Requests   Final    BOTTLES DRAWN AEROBIC AND ANAEROBIC Blood Culture adequate volume   Culture   Final    NO GROWTH < 12 HOURS Performed at [facility], [address], [place], [place] [address]    Report Status PENDING  Incomplete    Best Practice/Protocols:  VTE Prophylaxis: Heparin (drip) GI Prophylaxis: Proton Pump Inhibitor Continous Sedation-sedatives adjusted today ICU hyperglycemia protocol   Events: 8/29 admitted for severe resp failure CHF exacerbation 8/30 extubated 8/31 re-intubated, PICC line placed 9/2 severe hypoxia 9/3 RT hemithorax opacification plan for bronch, CVl placed, severe hypoxia  9/5 severe hypoxia 9/6 unable to wean from vent severe hypoxia 9/7 ETT dislodged, had to reintubate 9/8 remains ventilator dependent and requiring high PEEP   Studies: Ct Abdomen Pelvis Wo Contrast  Result Date: 11/17/2018 CLINICAL DATA:  Abdominal rigidity. Concern for perforated viscus. Currently intubated. EXAM: CT ABDOMEN AND PELVIS WITHOUT CONTRAST TECHNIQUE: Multidetector CT imaging of the abdomen and pelvis was performed following the standard protocol without IV contrast. COMPARISON:  None. FINDINGS: Lower chest: Small right and trace left pleural effusions with bilateral lower lobe airspace disease. Small amount of herniated right middle lobe between the fifth and sixth ribs. Hepatobiliary: Severe hepatic steatosis. Several small gallstones. No gallbladder wall thickening or biliary dilatation. Pancreas: Unremarkable. No pancreatic ductal dilatation or surrounding inflammatory changes. Spleen: Normal in size without focal abnormality. Adrenals/Urinary Tract: Adrenal glands are unremarkable. Punctate calculus in the lower pole the left kidney. No hydronephrosis. Bladder is decompressed by Foley catheter. Stomach/Bowel: Enteric tube tip in the distal stomach. Stomach is within normal limits. Appendix appears normal. No evidence of bowel wall thickening, distention, or inflammatory changes. Vascular/Lymphatic: Aortic atherosclerosis. No enlarged abdominal or pelvic lymph nodes. Reproductive: Prostate is unremarkable.  Other: Trace ascites.  No pneumoperitoneum. Musculoskeletal: No acute or significant osseous findings. IMPRESSION: 1.  No acute intra-abdominal process.  No perforation. 2. Trace ascites. 3. Severe hepatic steatosis. 4. Cholelithiasis. 5. Punctate nonobstructive left nephrolithiasis. 6. Small right and trace left pleural effusions. Bilateral lower lobe airspace disease may represent atelectasis, but pneumonia is difficult to exclude, particularly on the right. Electronically Signed   By: Titus Dubin M.D.   On: 11/17/2018 13:50   Dg Abd 1 View  Result Date: 11/26/2018 CLINICAL DATA:  ET and NG tube placement. CXR approved by MD at bedside. MD states no repeat necessary. Best attainable images due to patient conditions. EXAM: ABDOMEN - 1 VIEW COMPARISON:  Abdominal radiograph 11/19/2018 FINDINGS: The nasogastric tube distal aspect is coiled overlying the stomach. Paucity of bowel gas. No supine evidence for free intraperitoneal air. Left retrocardiac opacity and probable small effusion. Probable right basilar atelectasis. IMPRESSION: The nasogastric tube side port projects over the stomach. Electronically Signed   By: Audie Pinto M.D.   On: 11/26/2018 16:48   Dg Abd 1 View  Result Date: 11/17/2018 CLINICAL DATA:  Abdomen distension tube placement EXAM: ABDOMEN - 1 VIEW COMPARISON:  10/30/2018 FINDINGS: Esophageal tube tip visible to the distal stomach but incompletely included. Airspace disease at the right base. IMPRESSION: Esophageal tubing visible to the gastric body but tip is incompletely included in the field of view. Electronically Signed   By: Donavan Foil M.D.   On: 11/17/2018 00:10   Dg Abd 1 View  Result Date: 11/10/2018 CLINICAL DATA:  Abdominal distention EXAM: ABDOMEN - 1 VIEW COMPARISON:  None. FINDINGS: Limited study due to portable nature of the study and body habitus. I see no significant bowel dilatation or free air. Study otherwise limited. IMPRESSION: Very limited study. No  visible changes of bowel obstruction or free air. Electronically Signed   By: Rolm Baptise M.D.   On: 11/19/2018 22:33   Ct Head Wo Contrast  Result Date: 11/27/2018 CLINICAL DATA:  Altered level of consciousness. EXAM: CT HEAD WITHOUT CONTRAST TECHNIQUE: Contiguous axial images were obtained from the base of the skull through the vertex without intravenous contrast. COMPARISON:  None. FINDINGS: Brain: No evidence of acute infarction, hemorrhage, hydrocephalus, extra-axial collection or mass lesion/mass effect. Vascular: No hyperdense vessel or unexpected calcification. Skull: Normal. Negative for  fracture or focal lesion. Sinuses/Orbits: Fluid is noted in bilateral mastoid air cells. Bilateral ethmoid and sphenoid sinusitis is noted. Other: None. IMPRESSION: Bilateral ethmoid and sphenoid sinusitis. No acute intracranial abnormality seen. Electronically Signed   By: Marijo Conception M.D.   On: 11/27/2018 15:50   US Renal  Result Date: 11/25/2018 CLINICAL DATA:  Acute renal failure. EXAM: RENAL / URINARY TRACT ULTRASOUND COMPLETE COMPARISON:  Body CT November 17, 2018 FINDINGS: Right Kidney: Renal measurements: 12.2 x 6.9 x 7.5 cm = volume: 334 mL . Echogenicity within normal limits. No mass or hydronephrosis visualized. Left Kidney: Renal measurements: 14.9 x 6.5 x 6.7 cm = volume: 341 mL. Echogenicity within normal limits. No mass or hydronephrosis visualized. Bladder: Decompressed around urinary Foley and therefore not well evaluated. IMPRESSION: Normal appearance of the kidneys, accounting for the technical limitations of the study caused by body habitus. Electronically Signed   By: Fidela Salisbury M.D.   On: 11/25/2018 15:43   Dg Chest Port 1 View  Result Date: 11/26/2018 CLINICAL DATA:  ET and NG tube placement. CXR approved by MD at bedside. MD states no repeat necessary. Best attainable images due to patient conditions. EXAM: PORTABLE CHEST 1 VIEW COMPARISON:  Chest radiograph 11/24/2018,  11/22/2018 FINDINGS: Endotracheal tube tip terminates between the thoracic inlet and carina. The nasogastric tube courses below the diaphragm with nonvisualization of the distal tip. The right central venous catheter tip projects over the SVC. Cardiomegaly. No definite pneumothorax. Poor visualization of the bilateral lung bases but suspect persistent bibasilar opacities. No acute finding in the visualized skeleton. IMPRESSION: 1. Endotracheal tube tip terminates between the thoracic inlet and carina. 2. Nasogastric tube positioning is better seen on the subsequent abdominal radiograph. 3. Poor visualization of the lung bases but suspect ongoing bibasilar pulmonary opacities. Electronically Signed   By: Audie Pinto M.D.   On: 11/26/2018 16:44   Dg Chest Port 1 View  Result Date: 11/24/2018 CLINICAL DATA:  Shortness of breath EXAM: PORTABLE CHEST 1 VIEW COMPARISON:  November 22, 2018 FINDINGS: Endotracheal tube tip is seen 3.5 cm above the level of carina. NG tube is seen coursing below the diaphragm. A right-sided central venous catheter is seen within the mid SVC./slight interval improved aeration since prior exam. Probable subsegmental atelectasis remains at the lung bases. There is a retrocardiac opacity. IMPRESSION: Slight interval improvement in aeration. However there remains a retrocardiac opacity which could be atelectasis and/or infectious etiology. Electronically Signed   By: Prudencio Pair M.D.   On: 11/24/2018 03:51   Dg Chest Port 1 View  Result Date: 11/22/2018 CLINICAL DATA:  Ventilator support.  Follow-up. EXAM: PORTABLE CHEST 1 VIEW COMPARISON:  Earlier same day FINDINGS: Endotracheal tube tip is 3 cm above the carina. Orogastric or nasogastric tube enters the abdomen. Right internal jugular central line tip in the SVC above the right atrium. Persistent bilateral lower lung atelectasis and or pneumonia. No worsening or new finding. IMPRESSION: No apparent change since earlier today. Lines  and tubes well position. Persistent atelectasis and or pneumonia in the mid and lower lungs. Electronically Signed   By: Nelson Chimes M.D.   On: 11/22/2018 21:23   Dg Chest Port 1 View  Result Date: 11/22/2018 CLINICAL DATA:  Central line placement EXAM: PORTABLE CHEST 1 VIEW COMPARISON:  11/22/2018 FINDINGS: [name] rotated. Interval placement of a right IJ central venous catheter with distal tip terminating at the expected level of the superior cavoatrial junction. ET tube remains in place with  distal tip terminating 3.8 cm superior to the carina. Enteric tube courses below the diaphragm with distal tip beyond the inferior margin of the film. Multiple overlying external leads. Cardiac silhouette is largely obscured but appears enlarged. Bilateral pleural effusions. Volume loss within the right lung. Slight improved aeration of the right lung compared to prior. No pneumothorax visualized. IMPRESSION: 1. Interval placement of right IJ central venous catheter. No pneumothorax. 2. Improving aeration of the right lung. Electronically Signed   By: Davina Poke M.D.   On: 11/22/2018 14:26   Dg Chest Port 1 View  Result Date: 11/22/2018 CLINICAL DATA:  Multiple tracheobronchial mucus plugs, history CHF, diabetes mellitus, atrial fibrillation, hypertension EXAM: PORTABLE CHEST 1 VIEW COMPARISON:  Portable exam 0830 hours compared to 0035 hours FINDINGS: Tip of endotracheal tube projects 4.1 cm above carina. Nasogastric tube extends into abdomen. Volume loss in the RIGHT hemithorax with slight mediastinal shift to the RIGHT, likely reflecting a combination of atelectasis and question effusion. LEFT pleural effusion and basilar atelectasis are present. No pneumothorax. Bones demineralized. IMPRESSION: Atelectasis and probable pleural effusion opacified RIGHT hemithorax with volume loss. Persistent LEFT pleural effusion and basilar atelectasis. Electronically Signed   By: Lavonia Dana M.D.   On: 11/22/2018 08:44    Dg Chest Port 1 View  Result Date: 11/22/2018 CLINICAL DATA:  Hypoxia. EXAM: PORTABLE CHEST 1 VIEW COMPARISON:  Radiograph yesterday at 0550 hour FINDINGS: Endotracheal tube tip 3.6 cm from the carina. Enteric tube in place tip not well visualized. Complete opacification of the right hemithorax which is new from prior exam. Heart size and mediastinal contours are obscured. Left pleural effusion with volume loss in the left lung. Congestive changes on the left. IMPRESSION: 1. Complete opacification of the right hemithorax which is new from prior exam. Question underlying mucous plugging. 2. Overall low lung volumes. 3. Support apparatus unchanged. Electronically Signed   By: Keith Rake M.D.   On: 11/22/2018 01:11   Dg Chest Port 1 View  Result Date: 11/21/2018 CLINICAL DATA:  Acute respiratory failure. EXAM: PORTABLE CHEST 1 VIEW COMPARISON:  Radiograph of November 19, 2018. FINDINGS: Stable cardiomegaly. Endotracheal and nasogastric tubes are unchanged in position. No pneumothorax is noted. Probable mild to moderate size left pleural effusion is noted with associated atelectasis or infiltrate. Minimal right basilar atelectasis may be present. Bony thorax unremarkable. IMPRESSION: Stable support apparatus. Increased left basilar density is noted concerning for mild to moderate pleural effusion with probable underlying atelectasis or infiltrate. Electronically Signed   By: Marijo Conception M.D.   On: 11/21/2018 08:39   Dg Chest Port 1 View  Result Date: 11/19/2018 CLINICAL DATA:  Respiratory failure. EXAM: PORTABLE CHEST 1 VIEW COMPARISON:  11/13/2018 prior radiographs FINDINGS: An endotracheal tube with tip 4 cm above the carina and NG tube entering the stomach with tip off the field of view again noted. Cardiomegaly, pulmonary vascular congestion, small bilateral pleural effusions and bilateral LOWER lung atelectasis/consolidation again noted. There is no evidence of pneumothorax. IMPRESSION:  Unchanged appearance of the chest with support apparatus as described. Cardiomegaly, pulmonary vascular congestion, small bilateral pleural effusions and bilateral LOWER lung atelectasis/consolidation. Electronically Signed   By: Margarette Canada M.D.   On: 11/19/2018 16:51   Dg Chest Port 1 View  Result Date: 11/17/2018 CLINICAL DATA:  Intubation EXAM: PORTABLE CHEST 1 VIEW COMPARISON:  11/17/2018, 10/29/2018 FINDINGS: Endotracheal tube tip is about 3.1 cm superior to the carina. Esophageal tube tip extends below the diaphragm but is non included.  Bilateral pleural effusions, likely layering on the right. Cardiomegaly with vascular congestion and bilateral pulmonary edema. Basilar consolidations. No pneumothorax. IMPRESSION: 1. Endotracheal tube tip about 3.1 cm superior to carina 2. Cardiomegaly with vascular congestion, pulmonary edema and bilateral pleural effusion Electronically Signed   By: Donavan Foil M.D.   On: 11/17/2018 00:12   Dg Chest Port 1 View  Result Date: 11/19/2018 CLINICAL DATA:  Shortness of breath for 2 weeks. EXAM: PORTABLE CHEST 1 VIEW COMPARISON:  11/04/2010 radiograph FINDINGS: Cardiomegaly with pulmonary vascular congestion noted. Possible mild interstitial opacities/edema noted. No pneumothorax or acute bony abnormality. Bibasilar atelectasis noted. There may be trace pleural effusions present. IMPRESSION: Cardiomegaly with pulmonary vascular congestion and possible mild interstitial edema. Bibasilar atelectasis.  Possible trace pleural effusions. Electronically Signed   By: Margarette Canada M.D.   On: 11/15/2018 17:13   Dg Abd Portable 1v  Result Date: 11/19/2018 CLINICAL DATA:  NG tube placement. EXAM: PORTABLE ABDOMEN - 1 VIEW COMPARISON:  None. FINDINGS: An NG tube is noted with tip overlying the distal stomach. IMPRESSION: NG tube with tip overlying the distal stomach. Electronically Signed   By: Margarette Canada M.D.   On: 11/19/2018 16:51    Consults: Treatment Team:  Corey Skains, MD   Subjective:    Overnight Issues: Obtunded, persistent fever despite acetaminophen and cooling blanket.  Remains ventilator dependent. On Versed,Dilaudid gtts.    Objective:  Vital signs for last 24 hours: Temp:  [103.1 F (39.5 C)-104 F (40 C)] 103.1 F (39.5 C) (09/08 1930) Pulse Rate:  [36-121] 87 (09/08 1930) Resp:  [15-20] 18 (09/08 2000) BP: (83-123)/(58-106) 92/72 (09/08 2000) SpO2:  [93 %-100 %] 96 % (09/08 1930) FiO2 (%):  [40 %-100 %] 40 % (09/08 2043) Weight:  [213.6 kg] 213.6 kg (09/08 0456)  Hemodynamic parameters for last 24 hours:    Intake/Output from previous day: 09/07 0701 - 09/08 0700 In: 2957.7 [I.V.:1918.5; NG/GT:426; IV Piggyback:613.1] Out: 2650 [Urine:2650]  Intake/Output this shift: No intake/output data recorded.  Vent settings for last 24 hours: Vent Mode: PCV FiO2 (%):  [40 %-100 %] 40 % Set Rate:  [15 bmp-20 bmp] 15 bmp Vt Set:  [500 mL] 500 mL PEEP:  [15 cmH20] 15 cmH20 Plateau Pressure:  [26 cmH20-33 cmH20] 26 cmH20  Physical Exam:   GENERAL:critically ill appearing, obtunded HEAD: Normocephalic, atraumatic.  EYES: Pupils equal, round, reactive to light.  No scleral icterus.  MOUTH: Orotracheally intubated. NECK: Supple.  PULMONARY: +rhonchi, no wheezes CARDIOVASCULAR: S1 and S2. Regular rate and rhythm. No murmurs, rubs, or gallops.  GASTROINTESTINAL: Soft.Protruberant.Positive bowel sounds. MUSCULOSKELETAL: No cyanosis, clubbing, or edema.  NEUROLOGIC: obtunded SKIN:intact,warm,dry  Assessment/Plan:   1.  Acute on chronic hypoxic/hypercapnic respiratory failure: Bilateral pleural effusions with pulmonary edema due to worsening renal failure, underlying extreme obesity with obesity hypoventilation and severe OSA.  Patient also with pneumonia and compressive atelectasis of the lower lobes.  Ventilator changes made as appropriate.  Titrate O2 and PEEP for saturations of 88 to 92%.  Consider recruitment maneuvers.   Bronchodilator therapy/pulmonary toilet.  2.  Acute diastolic heart failure decompensation: Diuretics as tolerated given acute renal failure.  3.  Acute kidney failure superimposed on chronic kidney disease stage III: Appreciate nephrology's input.  Continue Foley catheter due to need for accurate I&O's.  Nonoliguric urine output at present.  Has not met criteria for dialysis as of yet.  Careful dosing of medications according to GFR.  4.  Altered mental status/obtundation/ventilator associated discomfort:  Will adjust sedatives and analgesics and wean off Versed and Dilaudid.  Decrease sedatives attempt to achieve RASS of -1.  CT head today.  5.  Pneumonia with Staph simulans and Strep mitis: Vancomycin per pharmacy.   Discussed with patient's sister [name] in detail.  Discussed with palliative care.  Patient remains DNR.  Discussed poor overall prognosis with the patient's sister she is still not committed to sectioning him to comfort care.   LOS: 11 days   Additional comments:Multidisciplinary rounds were performed today.  Critical Care Total Time*: 40 Minutes  C. Derrill Kay, MD [facility] 11/27/2018  *Care during the described time interval was provided by me and/or other providers on the critical care team.  I have reviewed this patient's available data, including medical history, events of note, physical examination and test results as part of my evaluation.

## 2018-11-27 NOTE — Progress Notes (Signed)
ANTICOAGULATION CONSULT NOTE   Pharmacy Consult for Heparin  Indication: atrial fibrillation  No Known Allergies  Patient Measurements: Height: 5' 9.02" (175.3 cm) Weight: (!) 471 lb (213.6 kg) IBW/kg (Calculated) : 70.74 Heparin Dosing Weight: 123 kg   Vital Signs: Temp: 103.1 F (39.5 C) (09/08 1930) Temp Source: Rectal (09/08 1930) BP: 98/71 (09/08 1930) Pulse Rate: 87 (09/08 1930)  Labs: Recent Labs    11/25/18 0958 11/25/18 1250 11/26/18 0455 11/27/18 0411 11/27/18 1217 11/27/18 1937  HGB 14.7  --  13.8 13.7  --   --   HCT 46.1  --  43.8 43.2  --   --   PLT 124*  --  107* 89*  --   --   HEPARINUNFRC 0.66  --  0.63 1.03* 0.76* 0.68  CREATININE  --  2.26* 2.25* 2.21*  --   --     Estimated Creatinine Clearance: 65.1 mL/min (A) (by C-G formula based on SCr of 2.21 mg/dL (H)).   Medical History: Past Medical History:  Diagnosis Date  . Atrial fibrillation ([place])   . CHF (congestive heart failure) ([place])   . Diabetes mellitus without complication ([place])   . Gout   . Hypertension associated with type 2 diabetes mellitus ([place])   . Morbid obesity ([place])   . Obstructive sleep apnea     Medications:  Medications Prior to Admission  Medication Sig Dispense Refill Last Dose  . aspirin EC 81 MG tablet Take 81 mg by mouth daily.   11/15/2018 at Unknown time  . colchicine 0.6 MG tablet Take 12 mg by mouth. At first sing of gout flare   prn at prn  . diltiazem (CARDIZEM CD) 240 MG 24 hr capsule Take 240 mg by mouth daily.   11/15/2018 at Unknown time  . furosemide (LASIX) 40 MG tablet Take 40 mg by mouth 2 (two) times daily.   11/15/2018 at Unknown time  . lisinopril (ZESTRIL) 5 MG tablet Take 5 mg by mouth daily.   11/15/2018 at Unknown time  . metFORMIN (GLUCOPHAGE) 500 MG tablet Take 500 mg by mouth 2 (two) times daily with a meal.   11/15/2018 at Unknown time  . metoprolol tartrate (LOPRESSOR) 100 MG tablet Take 50 mg by mouth 2 (two) times daily.   11/15/2018 at Unknown  time  . pravastatin (PRAVACHOL) 80 MG tablet Take 80 mg by mouth every evening.   11/15/2018 at Unknown time  . warfarin (COUMADIN) 4 MG tablet Take 2.5-4 mg by mouth daily. Take 4 mg on Sunday, Tuesday, Wednesday, Thursday, and Saturday. Take 2.5 mg on Monday and Friday   11/15/2018 at Unknown time    Assessment: Pharmacy consulted to initiate Heparin Drip on 58yo patient admitted with acute exacerbation of CHF. Patient diagnosed with nonvalvular atrial fibrillation with acute onset of severe shortness of breath, weakness and fatigue, and hypoxia over the past week. Patient takes warfarin PTA with schedule being 4mg  SuTWThSa, and 2.5mg  MF. Last dose of warfarin was on 8/29 and patient was transitioned to a heparin drip on 9/3 due to suspected non-correlation of INRs.    Goal of Therapy:  Heparin level 0.3-0.7 units/ml Monitor platelets by anticoagulation protocol: Yes   Plan:  9/8 1937 HL 0.68, therapeutic. Will continue heparin drip at 550 units/hr. HL 9/9 at 0200 to confirm. CBC with morning labs.  Dorena Bodo, PharmD Clinical Pharmacist 11/27/2018 8:03 PM

## 2018-11-27 NOTE — Progress Notes (Signed)
ANTICOAGULATION CONSULT NOTE   Pharmacy Consult for Heparin  Indication: atrial fibrillation  No Known Allergies  Patient Measurements: Height: 5' 9.02" (175.3 cm) Weight: (!) 471 lb (213.6 kg) IBW/kg (Calculated) : 70.74 Heparin Dosing Weight: 123 kg   Vital Signs: Temp: 103.4 F (39.7 C) (09/08 0400) Temp Source: Rectal (09/08 0400) BP: 99/67 (09/08 0400) Pulse Rate: 121 (09/08 0400)  Labs: Recent Labs    11/25/18 0958 11/25/18 1250 11/26/18 0455 11/27/18 0411  HGB 14.7  --  13.8 13.7  HCT 46.1  --  43.8 43.2  PLT 124*  --  107* 89*  HEPARINUNFRC 0.66  --  0.63 1.03*  CREATININE  --  2.26* 2.25* 2.21*    Estimated Creatinine Clearance: 65.1 mL/min (A) (by C-G formula based on SCr of 2.21 mg/dL (H)).   Medical History: Past Medical History:  Diagnosis Date  . Atrial fibrillation ([place])   . CHF (congestive heart failure) ([place])   . Diabetes mellitus without complication ([place])   . Gout   . Hypertension associated with type 2 diabetes mellitus ([place])   . Morbid obesity ([place])   . Obstructive sleep apnea     Medications:  Medications Prior to Admission  Medication Sig Dispense Refill Last Dose  . aspirin EC 81 MG tablet Take 81 mg by mouth daily.   11/15/2018 at Unknown time  . colchicine 0.6 MG tablet Take 12 mg by mouth. At first sing of gout flare   prn at prn  . diltiazem (CARDIZEM CD) 240 MG 24 hr capsule Take 240 mg by mouth daily.   11/15/2018 at Unknown time  . furosemide (LASIX) 40 MG tablet Take 40 mg by mouth 2 (two) times daily.   11/15/2018 at Unknown time  . lisinopril (ZESTRIL) 5 MG tablet Take 5 mg by mouth daily.   11/15/2018 at Unknown time  . metFORMIN (GLUCOPHAGE) 500 MG tablet Take 500 mg by mouth 2 (two) times daily with a meal.   11/15/2018 at Unknown time  . metoprolol tartrate (LOPRESSOR) 100 MG tablet Take 50 mg by mouth 2 (two) times daily.   11/15/2018 at Unknown time  . pravastatin (PRAVACHOL) 80 MG tablet Take 80 mg by mouth every evening.    11/15/2018 at Unknown time  . warfarin (COUMADIN) 4 MG tablet Take 2.5-4 mg by mouth daily. Take 4 mg on Sunday, Tuesday, Wednesday, Thursday, and Saturday. Take 2.5 mg on Monday and Friday   11/15/2018 at Unknown time    Assessment: Pharmacy consulted to initiate Heparin Drip on 58yo patient admitted with acute exacerbation of CHF. Patient diagnosed with nonvalvular atrial fibrillation with acute onset of severe shortness of breath, weakness and fatigue, and hypoxia over the past week. Patient takes warfarin PTA with schedule being 4mg  SuTWThSa, and 2.5mg  MF which was restarted in-patient. INR level on 8/30 was 3.4. [name] Hgb is stable. Will transition from Warfarin to Heparin while in-patient.  9/6 0240 HL 0.6 9/6 0958 HL 0.66  9/7 0455 HL 0.63, therapeutic 9/8 0411 HL 1.03  Goal of Therapy:  Heparin level 0.3-0.7 units/ml Monitor platelets by anticoagulation protocol: Yes   Plan:  INR now less than 2. Per discussion on rounds, heparin started.  Concern with warfarin being a safe option for patient with rather significant fluctuation in INR. Patient remains critically ill. Renal function has declined.   Heparin level supratherapeutic at 1.03, verified with RN that lab was not drawn from side that Heparin was infusing. Will hold Heparin for 1 hour and  resume at lower rate of 650 units/hr. Recheck HL 6 hours after restart. Daily CBC while on Heparin drip.   Paulina Fusi, PharmD, BCPS 11/27/2018 6:05 AM

## 2018-11-27 NOTE — Progress Notes (Signed)
Per ABG results and NP verbal, patient placed on PRVC 500, 15RR, 15 PEEP, 100% FIO2. Patient did not tolerate well, desaturated in low 80s, patient switched back to PC 25, 15 PEEP, 15RR, 60% FIO2. Patient tolerating PC mode well. SAT at 98%. Will continue to monitor. Repeat ABG at 0900.

## 2018-11-27 NOTE — Progress Notes (Signed)
Palliative: Mr. Parro is lying quietly in bed.  He is intubated/ventilated, sedated.  He had a cuff leak yesterday and needed to have his ET tube changed.  He is now noted to have high fevers, 103.8, with a cooling blanket in place.  There is no family at bedside at this time.  Conference with CCM attending and bedside nursing related to patient condition.   Call to brother Gwyndolyn Saxon at [phone].  I asked Gwyndolyn Saxon what he understands about Loras's health condition.  Gwyndolyn Saxon states that the last he heard from his Sister [name] was that Barn[place] had pneumonia and one long, but was getting antibiotic treatment.  He goes further to share that if the pneumonia can clear, "we will go from there".  He then states, "his heart is really struggling".   Gwyndolyn Saxon shares that he has not had updates from/talked to his Brandy Hale since Sunday evening, I share that Mr. Mitchelle is intubated/ventilated, tube in his lung to breathe for him.  I share that without this "life support" he would not be here with [place].  Gwyndolyn Saxon states, "[name] and I have discussed, and agreed on our plan of treatment.  I do not need to hear from any doom and gloom people, goodbye" and he hangs up.  No call to sister [name].  Nursing staff updated.   Plan: At this point continue to treat the treatable.  PMT to shadow.  64 minutes Quinn Axe, NP Palliative Medicine Team Team Phone # [phone] Greater than 50% of this time was spent counseling and coordinating care related to the above assessment and plan.

## 2018-11-27 NOTE — Consult Note (Addendum)
Pharmacy Antibiotic Note  Kyle White is a 58 y.o. male admitted on 11/10/2018 with pneumonia.  9/3 BAL significant for Staph simulans and Step mitis/oralis. Pharmacy has been consulted for vancomycin dosing which covers both identified organisms per culture susceptibility data. Per ICU rounds, cefepime was discontinued 9/8 as patient is adequately covered on vancomycin monotherapy.   [name] renal function is stable at ~2.2 (baseline of 1.3). Patient has received a one time loading dose of vancomycin 2500 mg yesterday and vancomycin random was 13 today (roughly 24 hours after dose). Today is day 2 of appropriate antibiotic coverage. WBC count is trending up and patient continues to have persistent fevers.   Plan: Start maintenance regimen of vancomycin 1750 mg IV q24h.  Goal AUC 400-550. Expected AUC: 478.1, trough 13.8 SCr used: 2.21  Will continue to follow daily serum creatinines per protocol and adjust therapy accordingly.   Height: 5' 9.02" (175.3 cm) Weight: (!) 471 lb (213.6 kg) IBW/kg (Calculated) : 70.74  Temp (24hrs), Avg:103.1 F (39.5 C), Min:101.3 F (38.5 C), Max:103.9 F (39.9 C)  Recent Labs  Lab 11/23/18 1320 11/24/18 0410 11/25/18 0445 11/25/18 0958 11/25/18 1250 11/26/18 0455 11/27/18 0411 11/27/18 1217  WBC  --  10.3 8.7 10.2  --  8.9 12.5*  --   CREATININE 1.89* 1.87*  --   --  2.26* 2.25* 2.21*  --   VANCORANDOM  --   --   --   --   --   --   --  13    Estimated Creatinine Clearance: 65.1 mL/min (A) (by C-G formula based on SCr of 2.21 mg/dL (H)).    No Known Allergies  Antimicrobials this admission: Cefepime 9/3 >> 9/8 Vancomycin 9/7 >>    Microbiology results: 9/7 Bcx: NGTD 9/3 BAL: Staph simulans & Strep mitis/oralis, vancomycin susceptible 8/29 Bcx: No growth 8/28 Ucx: No growth 8/28 MRSA PCR: negative  Thank you for allowing pharmacy to be a part of this patient's care.  [facility] [place] resident 11/27/2018 1:12 PM

## 2018-11-27 NOTE — Progress Notes (Signed)
Pharmacy Electrolyte Monitoring Consult:  Pharmacy consulted to assist in monitoring and replacing electrolytes in this 58 y.o. male admitted on 11/18/2018. [name] currently intubated and sedated on dexmedetomidine and hydromorphone infusion.   Labs:  Sodium (mmol/L)  Date Value  11/27/2018 147 (H)   Potassium (mmol/L)  Date Value  11/27/2018 4.0   Magnesium (mg/dL)  Date Value  11/26/2018 2.6 (H)   Phosphorus (mg/dL)  Date Value  11/26/2018 4.2   Calcium (mg/dL)  Date Value  11/27/2018 8.0 (L)   Albumin (g/dL)  Date Value  11/25/2018 2.6 (L)   Corrected Calcium: 9.1  Assessment/Plan: 1. Electrolytes: sodium trending up. Free water flushes increased to 242mL Q4hr. BMP with am labs.   2. Constipation: last documented bowel movement 8/28. Continue senna/docusate 2 tabs BID. Miralax VT Daily. Bisacodyl suppository x 1.   3. Glucose: Detemir increased to 10 units BID. Tube feeding coverage increased to 6 units Q4hr.   Pharmacy will continue to monitor and adjust per consult.    Mackie Holness L 11/27/2018 4:58 PM

## 2018-11-27 NOTE — Progress Notes (Signed)
ANTICOAGULATION CONSULT NOTE   Pharmacy Consult for Heparin  Indication: atrial fibrillation  No Known Allergies  Patient Measurements: Height: 5' 9.02" (175.3 cm) Weight: (!) 471 lb (213.6 kg) IBW/kg (Calculated) : 70.74 Heparin Dosing Weight: 123 kg   Vital Signs: Temp: 103.4 F (39.7 C) (09/08 0400) Temp Source: Rectal (09/08 0400) BP: 111/77 (09/08 0800) Pulse Rate: 113 (09/08 0800)  Labs: Recent Labs    11/25/18 0958 11/25/18 1250 11/26/18 0455 11/27/18 0411 11/27/18 1217  HGB 14.7  --  13.8 13.7  --   HCT 46.1  --  43.8 43.2  --   PLT 124*  --  107* 89*  --   HEPARINUNFRC 0.66  --  0.63 1.03* 0.76*  CREATININE  --  2.26* 2.25* 2.21*  --     Estimated Creatinine Clearance: 65.1 mL/min (A) (by C-G formula based on SCr of 2.21 mg/dL (H)).   Medical History: Past Medical History:  Diagnosis Date  . Atrial fibrillation ([place])   . CHF (congestive heart failure) ([place])   . Diabetes mellitus without complication ([place])   . Gout   . Hypertension associated with type 2 diabetes mellitus ([place])   . Morbid obesity ([place])   . Obstructive sleep apnea     Medications:  Medications Prior to Admission  Medication Sig Dispense Refill Last Dose  . aspirin EC 81 MG tablet Take 81 mg by mouth daily.   11/15/2018 at Unknown time  . colchicine 0.6 MG tablet Take 12 mg by mouth. At first sing of gout flare   prn at prn  . diltiazem (CARDIZEM CD) 240 MG 24 hr capsule Take 240 mg by mouth daily.   11/15/2018 at Unknown time  . furosemide (LASIX) 40 MG tablet Take 40 mg by mouth 2 (two) times daily.   11/15/2018 at Unknown time  . lisinopril (ZESTRIL) 5 MG tablet Take 5 mg by mouth daily.   11/15/2018 at Unknown time  . metFORMIN (GLUCOPHAGE) 500 MG tablet Take 500 mg by mouth 2 (two) times daily with a meal.   11/15/2018 at Unknown time  . metoprolol tartrate (LOPRESSOR) 100 MG tablet Take 50 mg by mouth 2 (two) times daily.   11/15/2018 at Unknown time  . pravastatin (PRAVACHOL) 80 MG  tablet Take 80 mg by mouth every evening.   11/15/2018 at Unknown time  . warfarin (COUMADIN) 4 MG tablet Take 2.5-4 mg by mouth daily. Take 4 mg on Sunday, Tuesday, Wednesday, Thursday, and Saturday. Take 2.5 mg on Monday and Friday   11/15/2018 at Unknown time    Assessment: Pharmacy consulted to initiate Heparin Drip on 57yo patient admitted with acute exacerbation of CHF. Patient diagnosed with nonvalvular atrial fibrillation with acute onset of severe shortness of breath, weakness and fatigue, and hypoxia over the past week. Patient takes warfarin PTA with schedule being 4mg  SuTWThSa, and 2.5mg  MF. Last dose of warfarin was on 8/29 and patient was transitioned to a heparin drip on 9/3 due to suspected non-correlation of INRs.    Goal of Therapy:  Heparin level 0.3-0.7 units/ml Monitor platelets by anticoagulation protocol: Yes   Plan:  Heparin level slightly supratherapeutic at 0.76. Will reduce rate to 550 units/hr. Recheck HL 6 hours after restart. Daily CBC while on heparin drip.  Lockie Mola, Pharmacy Resident 11/27/2018 1:33 PM

## 2018-11-28 ENCOUNTER — Inpatient Hospital Stay: Payer: Medicare Other

## 2018-11-28 LAB — CBC
HCT: 41.5 % (ref 39.0–52.0)
Hemoglobin: 12.5 g/dL — ABNORMAL LOW (ref 13.0–17.0)
MCH: 30 pg (ref 26.0–34.0)
MCHC: 30.1 g/dL (ref 30.0–36.0)
MCV: 99.8 fL (ref 80.0–100.0)
Platelets: 72 10*3/uL — ABNORMAL LOW (ref 150–400)
RBC: 4.16 MIL/uL — ABNORMAL LOW (ref 4.22–5.81)
RDW: 13.3 % (ref 11.5–15.5)
WBC: 13.3 10*3/uL — ABNORMAL HIGH (ref 4.0–10.5)
nRBC: 1 % — ABNORMAL HIGH (ref 0.0–0.2)

## 2018-11-28 LAB — RESPIRATORY PANEL BY PCR

## 2018-11-28 LAB — GLUCOSE, CAPILLARY
Glucose-Capillary: 306 mg/dL — ABNORMAL HIGH (ref 70–99)
Glucose-Capillary: 341 mg/dL — ABNORMAL HIGH (ref 70–99)
Glucose-Capillary: 350 mg/dL — ABNORMAL HIGH (ref 70–99)
Glucose-Capillary: 351 mg/dL — ABNORMAL HIGH (ref 70–99)
Glucose-Capillary: 325 mg/dL — ABNORMAL HIGH (ref 70–99)
Glucose-Capillary: 327 mg/dL — ABNORMAL HIGH (ref 70–99)
Glucose-Capillary: 305 mg/dL — ABNORMAL HIGH (ref 70–99)
Glucose-Capillary: 336 mg/dL — ABNORMAL HIGH (ref 70–99)
Glucose-Capillary: 305 mg/dL — ABNORMAL HIGH (ref 70–99)
Glucose-Capillary: 322 mg/dL — ABNORMAL HIGH (ref 70–99)
Glucose-Capillary: 248 mg/dL — ABNORMAL HIGH (ref 70–99)
Glucose-Capillary: 270 mg/dL — ABNORMAL HIGH (ref 70–99)
Glucose-Capillary: 209 mg/dL — ABNORMAL HIGH (ref 70–99)
Glucose-Capillary: 199 mg/dL — ABNORMAL HIGH (ref 70–99)
Glucose-Capillary: 186 mg/dL — ABNORMAL HIGH (ref 70–99)
Glucose-Capillary: 174 mg/dL — ABNORMAL HIGH (ref 70–99)
Glucose-Capillary: 168 mg/dL — ABNORMAL HIGH (ref 70–99)
Glucose-Capillary: 177 mg/dL — ABNORMAL HIGH (ref 70–99)
Glucose-Capillary: 143 mg/dL — ABNORMAL HIGH (ref 70–99)
Glucose-Capillary: 135 mg/dL — ABNORMAL HIGH (ref 70–99)

## 2018-11-28 LAB — BLOOD GAS, ARTERIAL
Acid-base deficit: 7 mmol/L — ABNORMAL HIGH (ref 0.0–2.0)
Bicarbonate: 17.8 mmol/L — ABNORMAL LOW (ref 20.0–28.0)
FIO2: 0.4
Mechanical Rate: 15
O2 Saturation: 91.7 %
PEEP: 15 cmH2O
Patient temperature: 37
Pressure control: 15 cmH2O
RATE: 15 resp/min
pCO2 arterial: 33 mmHg (ref 32.0–48.0)
pH, Arterial: 7.34 — ABNORMAL LOW (ref 7.350–7.450)
pO2, Arterial: 67 mmHg — ABNORMAL LOW (ref 83.0–108.0)

## 2018-11-28 LAB — BASIC METABOLIC PANEL
Anion gap: 11 (ref 5–15)
BUN: 98 mg/dL — ABNORMAL HIGH (ref 6–20)
CO2: 19 mmol/L — ABNORMAL LOW (ref 22–32)
Calcium: 7.7 mg/dL — ABNORMAL LOW (ref 8.9–10.3)
Chloride: 116 mmol/L — ABNORMAL HIGH (ref 98–111)
Creatinine, Ser: 3.05 mg/dL — ABNORMAL HIGH (ref 0.61–1.24)
GFR calc Af Amer: 25 mL/min — ABNORMAL LOW (ref >60)
GFR calc non Af Amer: 21 mL/min — ABNORMAL LOW (ref >60)
Glucose, Bld: 405 mg/dL — ABNORMAL HIGH (ref 70–99)
Potassium: 4.6 mmol/L (ref 3.5–5.1)
Sodium: 146 mmol/L — ABNORMAL HIGH (ref 135–145)

## 2018-11-28 LAB — POTASSIUM
Potassium: 4.1 mmol/L (ref 3.5–5.1)
Potassium: 3.9 mmol/L (ref 3.5–5.1)

## 2018-11-28 LAB — HEPARIN LEVEL (UNFRACTIONATED): Heparin Unfractionated: 0.57 IU/mL (ref 0.30–0.70)

## 2018-11-28 MED ORDER — INSULIN REGULAR(HUMAN) IN NACL 100-0.9 UT/100ML-% IV SOLN
INTRAVENOUS | Status: DC
  Administered 2018-11-28: 18.9 [IU]/h via INTRAVENOUS
  Administered 2018-11-28: 10 [IU]/h via INTRAVENOUS
  Administered 2018-11-28: 2.9 [IU]/h via INTRAVENOUS
  Administered 2018-11-29: 6.3 [IU]/h via INTRAVENOUS
  Filled 2018-11-28 (×4): qty 100

## 2018-11-28 MED ORDER — SODIUM CHLORIDE 0.9 % IV SOLN
1.0000 g | Freq: Two times a day (BID) | INTRAVENOUS | Status: DC
  Administered 2018-11-28 – 2018-11-29 (×3): 1 g via INTRAVENOUS
  Filled 2018-11-28 (×4): qty 1

## 2018-11-28 MED ORDER — BISACODYL 10 MG RE SUPP: 10.0000 mg | Freq: Once | RECTAL | Status: DC

## 2018-11-28 MED ORDER — SODIUM CHLORIDE 0.9 % IV SOLN
INTRAVENOUS | Status: DC | PRN
  Administered 2018-12-02: 200 mL via INTRAVENOUS
  Administered 2018-12-08 (×2): 250 mL via INTRAVENOUS

## 2018-11-28 MED ORDER — VANCOMYCIN HCL 1.25 G IV SOLR
1250.0000 mg | INTRAVENOUS | Status: DC
  Administered 2018-11-28: 16:00:00 1250 mg via INTRAVENOUS
  Filled 2018-11-28 (×2): qty 1250

## 2018-11-28 MED ORDER — VANCOMYCIN HCL 10 G IV SOLR
1250.0000 mg | INTRAVENOUS | Status: DC
  Filled 2018-11-28: qty 1250

## 2018-11-28 MED ORDER — SODIUM CHLORIDE 0.9 % IV SOLN
INTRAVENOUS | Status: DC
  Administered 2018-11-28: 08:00:00 via INTRAVENOUS

## 2018-11-28 MED ORDER — DEXTROSE-NACL 5-0.45 % IV SOLN: INTRAVENOUS | Status: DC

## 2018-11-28 MED ORDER — DEXTROSE 50 % IV SOLN: 25.0000 mL | INTRAVENOUS | Status: DC | PRN

## 2018-11-28 MED ORDER — STERILE WATER FOR INJECTION IV SOLN
INTRAVENOUS | Status: DC
  Administered 2018-11-28 – 2018-11-29 (×3): via INTRAVENOUS
  Filled 2018-11-28 (×5): qty 850

## 2018-11-28 MED ORDER — CHLORHEXIDINE GLUCONATE 0.12 % MT SOLN
OROMUCOSAL | Status: AC
  Filled 2018-11-28: qty 15

## 2018-11-28 NOTE — Progress Notes (Signed)
ANTICOAGULATION CONSULT NOTE   Pharmacy Consult for Heparin  Indication: atrial fibrillation  No Known Allergies  Patient Measurements: Height: 5' 9.02" (175.3 cm) Weight: (!) 471 lb (213.6 kg) IBW/kg (Calculated) : 70.74 Heparin Dosing Weight: 123 kg   Vital Signs: Temp: 101.7 F (38.7 C) (09/09 0200) Temp Source: Rectal (09/09 0200) BP: 89/61 (09/09 0200) Pulse Rate: 83 (09/09 0200)  Labs: Recent Labs    11/26/18 0455 11/27/18 0411 11/27/18 1217 11/27/18 1937 11/28/18 0151  HGB 13.8 13.7  --   --  12.5*  HCT 43.8 43.2  --   --  41.5  PLT 107* 89*  --   --  72*  HEPARINUNFRC 0.63 1.03* 0.76* 0.68 0.57  CREATININE 2.25* 2.21*  --   --  3.05*    Estimated Creatinine Clearance: 47.2 mL/min (A) (by C-G formula based on SCr of 3.05 mg/dL (H)).   Medical History: Past Medical History:  Diagnosis Date  . Atrial fibrillation ([place])   . CHF (congestive heart failure) ([place])   . Diabetes mellitus without complication ([place])   . Gout   . Hypertension associated with type 2 diabetes mellitus ([place])   . Morbid obesity ([place])   . Obstructive sleep apnea     Medications:  Medications Prior to Admission  Medication Sig Dispense Refill Last Dose  . aspirin EC 81 MG tablet Take 81 mg by mouth daily.   11/15/2018 at Unknown time  . colchicine 0.6 MG tablet Take 12 mg by mouth. At first sing of gout flare   prn at prn  . diltiazem (CARDIZEM CD) 240 MG 24 hr capsule Take 240 mg by mouth daily.   11/15/2018 at Unknown time  . furosemide (LASIX) 40 MG tablet Take 40 mg by mouth 2 (two) times daily.   11/15/2018 at Unknown time  . lisinopril (ZESTRIL) 5 MG tablet Take 5 mg by mouth daily.   11/15/2018 at Unknown time  . metFORMIN (GLUCOPHAGE) 500 MG tablet Take 500 mg by mouth 2 (two) times daily with a meal.   11/15/2018 at Unknown time  . metoprolol tartrate (LOPRESSOR) 100 MG tablet Take 50 mg by mouth 2 (two) times daily.   11/15/2018 at Unknown time  . pravastatin (PRAVACHOL) 80 MG  tablet Take 80 mg by mouth every evening.   11/15/2018 at Unknown time  . warfarin (COUMADIN) 4 MG tablet Take 2.5-4 mg by mouth daily. Take 4 mg on Sunday, Tuesday, Wednesday, Thursday, and Saturday. Take 2.5 mg on Monday and Friday   11/15/2018 at Unknown time    Assessment: Pharmacy consulted to initiate Heparin Drip on 57yo patient admitted with acute exacerbation of CHF. Patient diagnosed with nonvalvular atrial fibrillation with acute onset of severe shortness of breath, weakness and fatigue, and hypoxia over the past week. Patient takes warfarin PTA with schedule being 4mg  SuTWThSa, and 2.5mg  MF. Last dose of warfarin was on 8/29 and patient was transitioned to a heparin drip on 9/3 due to suspected non-correlation of INRs.    Goal of Therapy:  Heparin level 0.3-0.7 units/ml Monitor platelets by anticoagulation protocol: Yes   Plan:  09/09 @ 0200 HL 0.57 therapeutic. Will continue current rate and will recheck w/ am labs.  Tobie Lords, PharmD, BCPS Clinical Pharmacist 11/28/2018 3:05 AM

## 2018-11-28 NOTE — Progress Notes (Signed)
Follow up - Critical Care Medicine Note  Patient Details:    Kyle White is an 58 y.o. male  w/hx of morbid obesity, HFpEF hx MVR, CHF, PAF, OSA, DM, AHRF came in with acute hypoxemic hypercarbic resp failure with CXR showing b/l pl effusions and pulm edema. CT with restrictive physiology, pulm edema, bilateral pleural effusions and compressive atelectasiss/p extubation on BiPAP. Re-intubated 8/31. Severe hypoxia requiring high PEEP levels.  Lines, Airways, Drains: Airway 8.5 mm (Active)  Secured at (cm) 24 cm 11/27/18 2043  Measured From Lips 11/27/18 2043  Secured Location Right 11/27/18 2043  Secured By [employer] 11/27/18 2043  Tube Holder Repositioned Yes 11/27/18 2043  Cuff Pressure (cm H2O) 28 cm H2O 11/27/18 2043  Site Condition Dry 11/27/18 2043     CVC Triple Lumen 11/22/18 Right Internal jugular (Active)  Indication for Insertion or Continuance of Line Administration of hyperosmolar/irritating solutions (i.e. TPN, Vancomycin, etc.) 11/27/18 2000  Site Assessment Clean;Dry;Intact 11/27/18 2000  Proximal Lumen Status Infusing;Blood return noted 11/27/18 2000  Medial Lumen Status Infusing;Blood return noted 11/27/18 2000  Distal Lumen Status Blood return noted;In-line blood sampling system in place 11/27/18 2000  Dressing Type Transparent;Occlusive 11/27/18 2000  Dressing Status Clean;Dry;Intact;Antimicrobial disc in place 11/27/18 [place] checked and tightened 11/27/18 2000  Dressing Intervention Dressing reinforced 11/24/18 0727  Dressing Change Due 11/29/18 11/27/18 2000     NG/OG Tube Orogastric Center mouth Xray 78 cm (Active)  Cm Marking at Nare/Corner of Mouth (if applicable) 78 cm 16/10/96 1930  Site Assessment Clean;Dry;Intact 11/27/18 1930  Ongoing Placement Verification No change in cm markings or external length of tube from initial placement;No change in respiratory status;No acute changes, not attributed to clinical condition  11/27/18 1930  Status Infusing tube feed 11/27/18 1930  Amount of suction 120 mmHg 11/22/18 0400  Drainage Appearance Green;Brown 11/22/18 0400  Intake (mL) 40 mL 11/27/18 1742  Output (mL) 100 mL 11/21/18 1200     Urethral Catheter [name], RN Double-lumen 16 Fr. (Active)  Indication for Insertion or Continuance of Catheter Unstable critically ill patients first 24-48 hours (See Criteria) 11/27/18 1930  Site Assessment Clean;Intact 11/27/18 1930  Catheter Maintenance Bag below level of bladder;Catheter secured;Drainage bag/tubing not touching floor;Insertion date on drainage bag;No dependent loops;Seal intact;Bag emptied prior to transport 11/27/18 1930  Collection Container Standard drainage bag 11/27/18 1930  Securement Method Leg strap 11/27/18 1930  Urinary Catheter Interventions (if applicable) Unclamped 04/54/09 1930  Output (mL) 1125 mL 11/27/18 1742    Anti-infectives:  Anti-infectives (From admission, onward)   Start     Dose/Rate Route Frequency Ordered Stop   11/28/18 1530  vancomycin (VANCOCIN) 1,250 mg in sodium chloride 0.9 % 250 mL IVPB     1,250 mg 166.7 mL/hr over 90 Minutes Intravenous Every 24 hours 11/28/18 1510     11/28/18 1500  vancomycin (VANCOCIN) 1,250 mg in sodium chloride 0.9 % 250 mL IVPB  Status:  Discontinued     1,250 mg 166.7 mL/hr over 90 Minutes Intravenous Every 24 hours 11/28/18 1458 11/28/18 1510   11/28/18 1215  meropenem (MERREM) 1 g in sodium chloride 0.9 % 100 mL IVPB     1 g 200 mL/hr over 30 Minutes Intravenous Every 12 hours 11/28/18 1206     11/27/18 1500  vancomycin (VANCOCIN) 1,750 mg in sodium chloride 0.9 % 500 mL IVPB  Status:  Discontinued     1,750 mg 250 mL/hr over 120 Minutes Intravenous Every 24 hours  11/27/18 1355 11/28/18 0726   11/26/18 1100  vancomycin (VANCOCIN) 2,500 mg in sodium chloride 0.9 % 500 mL IVPB     2,500 mg 250 mL/hr over 120 Minutes Intravenous  Once 11/26/18 1040 11/26/18 1315   11/26/18 1058   vancomycin variable dose per unstable renal function (pharmacist dosing)  Status:  Discontinued      Does not apply See admin instructions 11/26/18 1058 11/27/18 1658   11/22/18 1015  ceFEPIme (MAXIPIME) 2 g in sodium chloride 0.9 % 100 mL IVPB  Status:  Discontinued     2 g 200 mL/hr over 30 Minutes Intravenous Every 8 hours 11/22/18 1009 11/27/18 1027      Microbiology: Results for orders placed or performed during the hospital encounter of 11/13/2018  SARS CORONAVIRUS 2 (TAT 6-12 HRS) Nasal Swab Aptima Multi Swab     Status: None   Collection Time: 11/07/2018  4:55 PM   Specimen: Aptima Multi Swab; Nasal Swab  Result Value Ref Range Status   SARS Coronavirus 2 NEGATIVE NEGATIVE Final    Comment: (NOTE) SARS-CoV-2 target nucleic acids are NOT DETECTED. The SARS-CoV-2 RNA is generally detectable in upper and lower respiratory specimens during the acute phase of infection. Negative results do not preclude SARS-CoV-2 infection, do not rule out co-infections with other pathogens, and should not be used as the sole basis for treatment or other patient management decisions. Negative results must be combined with clinical observations, patient history, and epidemiological information. The expected result is Negative. Fact Sheet for Patients: SugarRoll.be Fact Sheet for Healthcare Providers: https://www.woods-mathews.com/ This test is not yet approved or cleared by the [place] FDA and  has been authorized for detection and/or diagnosis of SARS-CoV-2 by FDA under an Emergency Use Authorization (EUA). This EUA will remain  in effect (meaning this test can be used) for the duration of the COVID-19 declaration under Section 56 4(b)(1) of the Act, 21 U.S.C. section 360bbb-3(b)(1), unless the authorization is terminated or revoked sooner. Performed at [facility], [place] [address], [place], [place] [address]   SARS Coronavirus 2 [facility]  order, Performed in [facility] hospital lab) Nasopharyngeal Nasopharyngeal Swab     Status: None   Collection Time: 11/02/2018 10:34 PM   Specimen: Nasopharyngeal Swab  Result Value Ref Range Status   SARS Coronavirus 2 NEGATIVE NEGATIVE Final    Comment: (NOTE) If result is NEGATIVE SARS-CoV-2 target nucleic acids are NOT DETECTED. The SARS-CoV-2 RNA is generally detectable in upper and lower  respiratory specimens during the acute phase of infection. The lowest  concentration of SARS-CoV-2 viral copies this assay can detect is 250  copies / mL. A negative result does not preclude SARS-CoV-2 infection  and should not be used as the sole basis for treatment or other  patient management decisions.  A negative result may occur with  improper specimen collection / handling, submission of specimen other  than nasopharyngeal swab, presence of viral mutation(s) within the  areas targeted by this assay, and inadequate number of viral copies  (<250 copies / mL). A negative result must be combined with clinical  observations, patient history, and epidemiological information. If result is POSITIVE SARS-CoV-2 target nucleic acids are DETECTED. The SARS-CoV-2 RNA is generally detectable in upper and lower  respiratory specimens dur ing the acute phase of infection.  Positive  results are indicative of active infection with SARS-CoV-2.  Clinical  correlation with patient history and other diagnostic information is  necessary to determine patient infection status.  Positive results do  not rule out bacterial infection or co-infection with other viruses. If result is PRESUMPTIVE POSTIVE SARS-CoV-2 nucleic acids MAY BE PRESENT.   A presumptive positive result was obtained on the submitted specimen  and confirmed on repeat testing.  While 2019 novel coronavirus  (SARS-CoV-2) nucleic acids may be present in the submitted sample  additional confirmatory testing may be necessary for epidemiological  and  / or clinical management purposes  to differentiate between  SARS-CoV-2 and other Sarbecovirus currently known to infect humans.  If clinically indicated additional testing with an alternate test  methodology [phone]) is advised. The SARS-CoV-2 RNA is generally  detectable in upper and lower respiratory sp ecimens during the acute  phase of infection. The expected result is Negative. Fact Sheet for Patients:  StrictlyIdeas.no Fact Sheet for Healthcare Providers: BankingDealers.co.za This test is not yet approved or cleared by the [place] FDA and has been authorized for detection and/or diagnosis of SARS-CoV-2 by FDA under an Emergency Use Authorization (EUA).  This EUA will remain in effect (meaning this test can be used) for the duration of the COVID-19 declaration under Section 564(b)(1) of the Act, 21 U.S.C. section 360bbb-3(b)(1), unless the authorization is terminated or revoked sooner. Performed at [facility], [address] [place], [place] [address]   Culture, respiratory (non-expectorated)     Status: None   Collection Time: 11/11/2018 10:34 PM   Specimen: Tracheal Aspirate; Respiratory  Result Value Ref Range Status   Specimen Description   Final    TRACHEAL ASPIRATE Performed at [facility], [address] [place], [place] [address]    Special Requests   Final    NONE Performed at [facility], [address] [place], [place] [address]    Gram Stain   Final    RARE WBC PRESENT, PREDOMINANTLY PMN ABUNDANT GRAM POSITIVE COCCI IN CHAINS    Culture   Final    Consistent with normal respiratory flora. Performed at [facility], [place] [address], [place], [place] [address]    Report Status 11/19/2018 FINAL  Final  Urine Culture     Status: None   Collection Time: 11/04/2018 11:12 PM   Specimen: Urine, Random  Result Value Ref Range Status   Specimen Description   Final     URINE, RANDOM Performed at [facility], [address] [place], [place] [address]    Special Requests   Final    NONE Performed at [facility], [address], [place], [place] [address]    Culture   Final    NO GROWTH Performed at [facility], [place] [address] [place],  [address]    Report Status 11/18/2018 FINAL  Final  MRSA PCR Screening     Status: None   Collection Time: 11/13/2018 11:28 PM   Specimen: Nasopharyngeal  Result Value Ref Range Status   MRSA by PCR NEGATIVE NEGATIVE Final    Comment:        The GeneXpert MRSA Assay (FDA approved for NASAL specimens only), is one component of a comprehensive MRSA colonization surveillance program. It is not intended to diagnose MRSA infection nor to guide or monitor treatment for MRSA infections. Performed at [facility], [address] [place],  [address]   Culture, blood (Routine X 2) w Reflex to ID Panel     Status: None   Collection Time: 11/17/18 12:19 AM   Specimen: BLOOD  Result Value Ref Range  Status   Specimen Description BLOOD RIGHT ANTECUBITAL  Final   Special Requests   Final    BOTTLES DRAWN AEROBIC AND ANAEROBIC Blood Culture adequate volume   Culture   Final    NO GROWTH 5 DAYS Performed at [facility], [address] [place], [place] [address]    Report Status 11/22/2018 FINAL  Final  Culture, blood (Routine X 2) w Reflex to ID Panel     Status: None   Collection Time: 11/17/18 12:31 AM   Specimen: BLOOD  Result Value Ref Range Status   Specimen Description BLOOD CENTRAL LINE MIDLINE  Final   Special Requests   Final    BOTTLES DRAWN AEROBIC AND ANAEROBIC Blood Culture adequate volume   Culture   Final    NO GROWTH 5 DAYS Performed at [facility], [address], [place], [place] [address]    Report Status 11/22/2018 FINAL  Final  Culture, bal-quantitative     Status: Abnormal   Collection Time: 11/22/18  7:48 AM    Specimen: Bronchoalveolar Lavage; Respiratory  Result Value Ref Range Status   Specimen Description   Final    BRONCHIAL ALVEOLAR LAVAGE Performed at [facility], [address] [place], [place] [address]    Special Requests   Final    Immunocompromised Performed at [facility], [place], [place] [address]    Gram Stain   Final    FEW WBC PRESENT, PREDOMINANTLY PMN FEW GRAM POSITIVE COCCI IN PAIRS IN CLUSTERS FEW GRAM POSITIVE RODS Performed at [facility], [place] [address], [place], [place] [address]    Culture (A)  Final    >=100,000 COLONIES/mL STAPHYLOCOCCUS SIMULANS >=100,000 COLONIES/mL STREPTOCOCCUS MITIS/ORALIS    Report Status 11/25/2018 FINAL  Final   Organism ID, Bacteria STAPHYLOCOCCUS SIMULANS (A)  Final   Organism ID, Bacteria STREPTOCOCCUS MITIS/ORALIS (A)  Final      Susceptibility   Staphylococcus simulans - MIC*    CIPROFLOXACIN <=0.5 SENSITIVE Sensitive     ERYTHROMYCIN <=0.25 SENSITIVE Sensitive     GENTAMICIN <=0.5 SENSITIVE Sensitive     OXACILLIN <=0.25 SENSITIVE Sensitive     TETRACYCLINE >=16 RESISTANT Resistant     VANCOMYCIN <=0.5 SENSITIVE Sensitive     TRIMETH/SULFA <=10 SENSITIVE Sensitive     CLINDAMYCIN <=0.25 SENSITIVE Sensitive     RIFAMPIN <=0.5 SENSITIVE Sensitive     Inducible Clindamycin NEGATIVE Sensitive     * >=100,000 COLONIES/mL STAPHYLOCOCCUS SIMULANS   Streptococcus mitis/oralis - MIC*    TETRACYCLINE >=16 RESISTANT Resistant     VANCOMYCIN 0.25 SENSITIVE Sensitive     CLINDAMYCIN 0.5 INTERMEDIATE Intermediate     * >=100,000 COLONIES/mL STREPTOCOCCUS MITIS/ORALIS  CULTURE, BLOOD (ROUTINE X 2) w Reflex to ID Panel     Status: None (Preliminary result)   Collection Time: 11/26/18 10:32 PM   Specimen: BLOOD  Result Value Ref Range Status   Specimen Description BLOOD RIGHT ANTECUBITAL  Final   Special Requests   Final    BOTTLES DRAWN AEROBIC AND ANAEROBIC Blood Culture adequate volume    Culture   Final    NO GROWTH 2 DAYS Performed at [facility], [address] [place], [place] [address]    Report Status PENDING  Incomplete  CULTURE, BLOOD (ROUTINE X 2) w Reflex to ID Panel     Status: None (Preliminary result)   Collection Time: 11/26/18 10:32 PM   Specimen: BLOOD  Result Value Ref Range Status  Specimen Description BLOOD BLOOD RIGHT HAND  Final   Special Requests   Final    BOTTLES DRAWN AEROBIC AND ANAEROBIC Blood Culture adequate volume   Culture   Final    NO GROWTH 2 DAYS Performed at [facility], [address] [place], [place] [address]    Report Status PENDING  Incomplete  Respiratory Panel by PCR     Status: None   Collection Time: 11/28/18 10:36 AM   Specimen: Nasopharyngeal Swab; Respiratory  Result Value Ref Range Status   Adenovirus NOT DETECTED NOT DETECTED Final   Coronavirus 229E NOT DETECTED NOT DETECTED Final    Comment: (NOTE) The Coronavirus on the Respiratory Panel, DOES NOT test for the novel  Coronavirus (2019 nCoV)    Coronavirus HKU1 NOT DETECTED NOT DETECTED Final   Coronavirus NL63 NOT DETECTED NOT DETECTED Final   Coronavirus OC43 NOT DETECTED NOT DETECTED Final   Metapneumovirus NOT DETECTED NOT DETECTED Final   Rhinovirus / Enterovirus NOT DETECTED NOT DETECTED Final   Influenza A NOT DETECTED NOT DETECTED Final   Influenza B NOT DETECTED NOT DETECTED Final   Parainfluenza Virus 1 NOT DETECTED NOT DETECTED Final   Parainfluenza Virus 2 NOT DETECTED NOT DETECTED Final   Parainfluenza Virus 3 NOT DETECTED NOT DETECTED Final   Parainfluenza Virus 4 NOT DETECTED NOT DETECTED Final   Respiratory Syncytial Virus NOT DETECTED NOT DETECTED Final   Bordetella pertussis NOT DETECTED NOT DETECTED Final   Chlamydophila pneumoniae NOT DETECTED NOT DETECTED Final   Mycoplasma pneumoniae NOT DETECTED NOT DETECTED Final    Comment: Performed at [facility], [place], [place], [place] [address]     Best Practice/Protocols:  VTE Prophylaxis: Heparin (drip) GI Prophylaxis: Proton Pump Inhibitor Continous Sedation-sedatives adjusted yesterday ICU hyperglycemia protocol  Events: 8/29 admitted for severe resp failure CHF exacerbation 8/30 extubated 8/31 re-intubated, PICC line placed 9/2 severe hypoxia 9/3 RT hemithorax opacification plan for bronch, CVl placed, severe hypoxia 9/5 severe hypoxia 9/6 unable to wean from vent severe hypoxia 9/7 ETT dislodged, had to reintubate 9/8 remains ventilator dependent and requiring high PEEP 9/8 CT head, no abnl except sinusitis/mastoiditis independent review 9/9 Pancultured, sputum purulent, Merrem started   Studies: Ct Abdomen Pelvis Wo Contrast  Result Date: 11/17/2018 CLINICAL DATA:  Abdominal rigidity. Concern for perforated viscus. Currently intubated. EXAM: CT ABDOMEN AND PELVIS WITHOUT CONTRAST TECHNIQUE: Multidetector CT imaging of the abdomen and pelvis was performed following the standard protocol without IV contrast. COMPARISON:  None. FINDINGS: Lower chest: Small right and trace left pleural effusions with bilateral lower lobe airspace disease. Small amount of herniated right middle lobe between the fifth and sixth ribs. Hepatobiliary: Severe hepatic steatosis. Several small gallstones. No gallbladder wall thickening or biliary dilatation. Pancreas: Unremarkable. No pancreatic ductal dilatation or surrounding inflammatory changes. Spleen: Normal in size without focal abnormality. Adrenals/Urinary Tract: Adrenal glands are unremarkable. Punctate calculus in the lower pole the left kidney. No hydronephrosis. Bladder is decompressed by Foley catheter. Stomach/Bowel: Enteric tube tip in the distal stomach. Stomach is within normal limits. Appendix appears normal. No evidence of bowel wall thickening, distention, or inflammatory changes. Vascular/Lymphatic: Aortic atherosclerosis. No enlarged abdominal or pelvic lymph nodes. Reproductive:  Prostate is unremarkable. Other: Trace ascites.  No pneumoperitoneum. Musculoskeletal: No acute or significant osseous findings. IMPRESSION: 1.  No acute intra-abdominal process.  No perforation. 2. Trace ascites. 3. Severe hepatic steatosis. 4. Cholelithiasis. 5. Punctate nonobstructive left nephrolithiasis. 6. Small right and trace left pleural effusions.  Bilateral lower lobe airspace disease may represent atelectasis, but pneumonia is difficult to exclude, particularly on the right. Electronically Signed   By: Titus Dubin M.D.   On: 11/17/2018 13:50   Dg Abd 1 View  Result Date: 11/26/2018 CLINICAL DATA:  ET and NG tube placement. CXR approved by MD at bedside. MD states no repeat necessary. Best attainable images due to patient conditions. EXAM: ABDOMEN - 1 VIEW COMPARISON:  Abdominal radiograph 11/19/2018 FINDINGS: The nasogastric tube distal aspect is coiled overlying the stomach. Paucity of bowel gas. No supine evidence for free intraperitoneal air. Left retrocardiac opacity and probable small effusion. Probable right basilar atelectasis. IMPRESSION: The nasogastric tube side port projects over the stomach. Electronically Signed   By: Audie Pinto M.D.   On: 11/26/2018 16:48   Dg Abd 1 View  Result Date: 11/17/2018 CLINICAL DATA:  Abdomen distension tube placement EXAM: ABDOMEN - 1 VIEW COMPARISON:  10/31/2018 FINDINGS: Esophageal tube tip visible to the distal stomach but incompletely included. Airspace disease at the right base. IMPRESSION: Esophageal tubing visible to the gastric body but tip is incompletely included in the field of view. Electronically Signed   By: Donavan Foil M.D.   On: 11/17/2018 00:10   Dg Abd 1 View  Result Date: 11/02/2018 CLINICAL DATA:  Abdominal distention EXAM: ABDOMEN - 1 VIEW COMPARISON:  None. FINDINGS: Limited study due to portable nature of the study and body habitus. I see no significant bowel dilatation or free air. Study otherwise limited.  IMPRESSION: Very limited study. No visible changes of bowel obstruction or free air. Electronically Signed   By: Rolm Baptise M.D.   On: 11/15/2018 22:33   Ct Head Wo Contrast  Result Date: 11/27/2018 CLINICAL DATA:  Altered level of consciousness. EXAM: CT HEAD WITHOUT CONTRAST TECHNIQUE: Contiguous axial images were obtained from the base of the skull through the vertex without intravenous contrast. COMPARISON:  None. FINDINGS: Brain: No evidence of acute infarction, hemorrhage, hydrocephalus, extra-axial collection or mass lesion/mass effect. Vascular: No hyperdense vessel or unexpected calcification. Skull: Normal. Negative for fracture or focal lesion. Sinuses/Orbits: Fluid is noted in bilateral mastoid air cells. Bilateral ethmoid and sphenoid sinusitis is noted. Other: None. IMPRESSION: Bilateral ethmoid and sphenoid sinusitis. No acute intracranial abnormality seen. Electronically Signed   By: Marijo Conception M.D.   On: 11/27/2018 15:50   US Renal  Result Date: 11/25/2018 CLINICAL DATA:  Acute renal failure. EXAM: RENAL / URINARY TRACT ULTRASOUND COMPLETE COMPARISON:  Body CT November 17, 2018 FINDINGS: Right Kidney: Renal measurements: 12.2 x 6.9 x 7.5 cm = volume: 334 mL . Echogenicity within normal limits. No mass or hydronephrosis visualized. Left Kidney: Renal measurements: 14.9 x 6.5 x 6.7 cm = volume: 341 mL. Echogenicity within normal limits. No mass or hydronephrosis visualized. Bladder: Decompressed around urinary Foley and therefore not well evaluated. IMPRESSION: Normal appearance of the kidneys, accounting for the technical limitations of the study caused by body habitus. Electronically Signed   By: Fidela Salisbury M.D.   On: 11/25/2018 15:43   Dg Chest Port 1 View  Result Date: 11/28/2018 CLINICAL DATA:  Fever, intubation EXAM: PORTABLE CHEST 1 VIEW COMPARISON:  11/26/2018 FINDINGS: No significant interval change in AP portable chest radiograph with endotracheal tube over the mid  trachea, esophagogastric tube, right neck vascular catheter, cardiomegaly, and layering small bilateral pleural effusions. No new or focal airspace opacity. IMPRESSION: No significant interval change in AP portable chest radiograph with cardiomegaly and layering small pleural effusions. No  new or focal airspace opacity. Unchanged support apparatus. Electronically Signed   By: Eddie Candle M.D.   On: 11/28/2018 12:47   Dg Chest Port 1 View  Result Date: 11/26/2018 CLINICAL DATA:  ET and NG tube placement. CXR approved by MD at bedside. MD states no repeat necessary. Best attainable images due to patient conditions. EXAM: PORTABLE CHEST 1 VIEW COMPARISON:  Chest radiograph 11/24/2018, 11/22/2018 FINDINGS: Endotracheal tube tip terminates between the thoracic inlet and carina. The nasogastric tube courses below the diaphragm with nonvisualization of the distal tip. The right central venous catheter tip projects over the SVC. Cardiomegaly. No definite pneumothorax. Poor visualization of the bilateral lung bases but suspect persistent bibasilar opacities. No acute finding in the visualized skeleton. IMPRESSION: 1. Endotracheal tube tip terminates between the thoracic inlet and carina. 2. Nasogastric tube positioning is better seen on the subsequent abdominal radiograph. 3. Poor visualization of the lung bases but suspect ongoing bibasilar pulmonary opacities. Electronically Signed   By: Audie Pinto M.D.   On: 11/26/2018 16:44   Dg Chest Port 1 View  Result Date: 11/24/2018 CLINICAL DATA:  Shortness of breath EXAM: PORTABLE CHEST 1 VIEW COMPARISON:  November 22, 2018 FINDINGS: Endotracheal tube tip is seen 3.5 cm above the level of carina. NG tube is seen coursing below the diaphragm. A right-sided central venous catheter is seen within the mid SVC./slight interval improved aeration since prior exam. Probable subsegmental atelectasis remains at the lung bases. There is a retrocardiac opacity. IMPRESSION:  Slight interval improvement in aeration. However there remains a retrocardiac opacity which could be atelectasis and/or infectious etiology. Electronically Signed   By: Prudencio Pair M.D.   On: 11/24/2018 03:51   Dg Chest Port 1 View  Result Date: 11/22/2018 CLINICAL DATA:  Ventilator support.  Follow-up. EXAM: PORTABLE CHEST 1 VIEW COMPARISON:  Earlier same day FINDINGS: Endotracheal tube tip is 3 cm above the carina. Orogastric or nasogastric tube enters the abdomen. Right internal jugular central line tip in the SVC above the right atrium. Persistent bilateral lower lung atelectasis and or pneumonia. No worsening or new finding. IMPRESSION: No apparent change since earlier today. Lines and tubes well position. Persistent atelectasis and or pneumonia in the mid and lower lungs. Electronically Signed   By: Nelson Chimes M.D.   On: 11/22/2018 21:23   Dg Chest Port 1 View  Result Date: 11/22/2018 CLINICAL DATA:  Central line placement EXAM: PORTABLE CHEST 1 VIEW COMPARISON:  11/22/2018 FINDINGS: [name] rotated. Interval placement of a right IJ central venous catheter with distal tip terminating at the expected level of the superior cavoatrial junction. ET tube remains in place with distal tip terminating 3.8 cm superior to the carina. Enteric tube courses below the diaphragm with distal tip beyond the inferior margin of the film. Multiple overlying external leads. Cardiac silhouette is largely obscured but appears enlarged. Bilateral pleural effusions. Volume loss within the right lung. Slight improved aeration of the right lung compared to prior. No pneumothorax visualized. IMPRESSION: 1. Interval placement of right IJ central venous catheter. No pneumothorax. 2. Improving aeration of the right lung. Electronically Signed   By: Davina Poke M.D.   On: 11/22/2018 14:26   Dg Chest Port 1 View  Result Date: 11/22/2018 CLINICAL DATA:  Multiple tracheobronchial mucus plugs, history CHF, diabetes mellitus,  atrial fibrillation, hypertension EXAM: PORTABLE CHEST 1 VIEW COMPARISON:  Portable exam 0830 hours compared to 0035 hours FINDINGS: Tip of endotracheal tube projects 4.1 cm above carina. Nasogastric tube  extends into abdomen. Volume loss in the RIGHT hemithorax with slight mediastinal shift to the RIGHT, likely reflecting a combination of atelectasis and question effusion. LEFT pleural effusion and basilar atelectasis are present. No pneumothorax. Bones demineralized. IMPRESSION: Atelectasis and probable pleural effusion opacified RIGHT hemithorax with volume loss. Persistent LEFT pleural effusion and basilar atelectasis. Electronically Signed   By: Lavonia Dana M.D.   On: 11/22/2018 08:44   Dg Chest Port 1 View  Result Date: 11/22/2018 CLINICAL DATA:  Hypoxia. EXAM: PORTABLE CHEST 1 VIEW COMPARISON:  Radiograph yesterday at 0550 hour FINDINGS: Endotracheal tube tip 3.6 cm from the carina. Enteric tube in place tip not well visualized. Complete opacification of the right hemithorax which is new from prior exam. Heart size and mediastinal contours are obscured. Left pleural effusion with volume loss in the left lung. Congestive changes on the left. IMPRESSION: 1. Complete opacification of the right hemithorax which is new from prior exam. Question underlying mucous plugging. 2. Overall low lung volumes. 3. Support apparatus unchanged. Electronically Signed   By: Keith Rake M.D.   On: 11/22/2018 01:11   Dg Chest Port 1 View  Result Date: 11/21/2018 CLINICAL DATA:  Acute respiratory failure. EXAM: PORTABLE CHEST 1 VIEW COMPARISON:  Radiograph of November 19, 2018. FINDINGS: Stable cardiomegaly. Endotracheal and nasogastric tubes are unchanged in position. No pneumothorax is noted. Probable mild to moderate size left pleural effusion is noted with associated atelectasis or infiltrate. Minimal right basilar atelectasis may be present. Bony thorax unremarkable. IMPRESSION: Stable support apparatus. Increased  left basilar density is noted concerning for mild to moderate pleural effusion with probable underlying atelectasis or infiltrate. Electronically Signed   By: Marijo Conception M.D.   On: 11/21/2018 08:39   Dg Chest Port 1 View  Result Date: 11/19/2018 CLINICAL DATA:  Respiratory failure. EXAM: PORTABLE CHEST 1 VIEW COMPARISON:  10/25/2018 prior radiographs FINDINGS: An endotracheal tube with tip 4 cm above the carina and NG tube entering the stomach with tip off the field of view again noted. Cardiomegaly, pulmonary vascular congestion, small bilateral pleural effusions and bilateral LOWER lung atelectasis/consolidation again noted. There is no evidence of pneumothorax. IMPRESSION: Unchanged appearance of the chest with support apparatus as described. Cardiomegaly, pulmonary vascular congestion, small bilateral pleural effusions and bilateral LOWER lung atelectasis/consolidation. Electronically Signed   By: Margarette Canada M.D.   On: 11/19/2018 16:51   Dg Chest Port 1 View  Result Date: 11/17/2018 CLINICAL DATA:  Intubation EXAM: PORTABLE CHEST 1 VIEW COMPARISON:  11/17/2018, 10/29/2018 FINDINGS: Endotracheal tube tip is about 3.1 cm superior to the carina. Esophageal tube tip extends below the diaphragm but is non included. Bilateral pleural effusions, likely layering on the right. Cardiomegaly with vascular congestion and bilateral pulmonary edema. Basilar consolidations. No pneumothorax. IMPRESSION: 1. Endotracheal tube tip about 3.1 cm superior to carina 2. Cardiomegaly with vascular congestion, pulmonary edema and bilateral pleural effusion Electronically Signed   By: Donavan Foil M.D.   On: 11/17/2018 00:12   Dg Chest Port 1 View  Result Date: 11/08/2018 CLINICAL DATA:  Shortness of breath for 2 weeks. EXAM: PORTABLE CHEST 1 VIEW COMPARISON:  11/04/2010 radiograph FINDINGS: Cardiomegaly with pulmonary vascular congestion noted. Possible mild interstitial opacities/edema noted. No pneumothorax or acute  bony abnormality. Bibasilar atelectasis noted. There may be trace pleural effusions present. IMPRESSION: Cardiomegaly with pulmonary vascular congestion and possible mild interstitial edema. Bibasilar atelectasis.  Possible trace pleural effusions. Electronically Signed   By: Margarette Canada M.D.   On: 11/09/2018  17:13   Dg Abd Portable 1v  Result Date: 11/19/2018 CLINICAL DATA:  NG tube placement. EXAM: PORTABLE ABDOMEN - 1 VIEW COMPARISON:  None. FINDINGS: An NG tube is noted with tip overlying the distal stomach. IMPRESSION: NG tube with tip overlying the distal stomach. Electronically Signed   By: Margarette Canada M.D.   On: 11/19/2018 16:51    Consults: Treatment Team:  Corey Skains, MD   Subjective:    Overnight Issues: Remains obtunded, persistent fever despite acetaminophen and cooling blanket.  CT head showed no cerebral injury however patient does have sinusitis.  Remains ventilator dependent requiring high levels of PEEP.  Objective:  Vital signs for last 24 hours: Temp:  [100.4 F (38 C)-102.5 F (39.2 C)] 100.4 F (38 C) (09/09 1942) Pulse Rate:  [50-125] 85 (09/09 2100) Resp:  [13-27] 15 (09/09 2100) BP: (73-112)/(59-86) 106/82 (09/09 2100) SpO2:  [91 %-98 %] 94 % (09/09 2100) FiO2 (%):  [40 %-60 %] 50 % (09/09 1935) Weight:  [216.4 kg] 216.4 kg (09/09 0451)  Hemodynamic parameters for last 24 hours: CVP:  [8 mmHg-11 mmHg] 8 mmHg  Intake/Output from previous day: 09/08 0701 - 09/09 0700 In: 5825.7 [I.V.:2186.5; NG/GT:2639.3; IV Piggyback:1000] Out: 1550 [Urine:1550]  Intake/Output this shift: Total I/O In: 316 [I.V.:316] Out: -   Vent settings for last 24 hours: Vent Mode: PCV FiO2 (%):  [40 %-60 %] 50 % Set Rate:  [15 bmp] 15 bmp PEEP:  [15 cmH20] 15 cmH20 Plateau Pressure:  [27 cmH20] 27 cmH20  Physical Exam:   GENERAL:critically ill appearing, synchronous with the ventilator, remains obtunded, febrile HEAD: Normocephalic, atraumatic.  EYES: Pupils  equal, round, reactive to light.  No scleral icterus.  MOUTH: Orotracheally intubated.   NECK: Supple.  PULMONARY: +rhonchi, no wheezes, no rales. CARDIOVASCULAR: S1 and S2. Regular rate and rhythm. No murmurs, rubs, or gallops.  GASTROINTESTINAL: Soft,protuberant/obese. Positive bowel sounds. MUSCULOSKELETAL: No cyanosis, clubbing, or edema.  NEUROLOGIC: opens eyes, nystagmus present, not following commands SKIN:intact,warm,dry  Assessment/Plan:  1.  Acute on chronic hypoxic/hypercapnic respiratory failure: Bilateral pleural effusions with pulmonary edema due to worsening renal failure, underlying extreme obesity with obesity hypoventilation and severe OSA.  Patient also with pneumonia and compressive atelectasis of the lower lobes.  Ventilator changes made as appropriate.  Titrate O2 and PEEP for saturations of 88 to 92%.  Consider recruitment maneuvers.  Bronchodilator therapy/pulmonary toilet.  2.  Acute diastolic heart failure decompensation: Diuretics as tolerated given acute renal failure.  3.  Acute kidney failure superimposed on chronic kidney disease stage III: Appreciate nephrology's input.  Has associated metabolic acidosis, bicarbonate drip started by Dr. Juleen China. Continue Foley catheter due to need for accurate I&O's.  Nonoliguric urine output at present.  Has not met criteria for dialysis as of yet.  Careful dosing of medications according to GFR.  4.  Altered mental status/obtundation/ventilator associated discomfort: Will adjust sedatives and analgesics and wean off Versed and Dilaudid.  Decrease sedatives attempt to achieve RASS of -1.    EEG will be ordered given nystagmus noted on exam, rule out subclinical status.  5.  Pneumonia with Staph simulans and Strep mitis: Vancomycin per pharmacy.  Because of persistent fever and purulent sputum, he was recultured yesterday.  Respiratory viral panel negative.  Sputum culture pending.  On meropenem.  Possible sources of fever could  be sinusitis/mastoiditis.  Discussed with palliative care.  Patient remains DNR.  Overall prognosis is exceedingly poor.    LOS: 12 days   Additional  comments:Multidisciplinary rounds were performed today.  Critical Care Total Time*: 35 Minutes  C. Derrill Kay, MD [facility] 11/28/2018  *Care during the described time interval was provided by me and/or other providers on the critical care team.  I have reviewed this patient's available data, including medical history, events of note, physical examination and test results as part of my evaluation.

## 2018-11-28 NOTE — Consult Note (Signed)
Pharmacy Antibiotic Note  Kyle White is a 58 y.o. male admitted on 10/29/2018 with pneumonia.  9/3 BAL significant for Staph simulans and Step mitis/oralis. Pharmacy has been consulted for vancomycin dosing which covers both identified organisms per culture susceptibility data. Per ICU rounds, cefepime was discontinued 9/8 as patient is adequately covered on vancomycin monotherapy.    Today's labs reflected an increase in Scr from 2.21 to 3.05. Prior to today patient's Scr had been stable around ~2.2 (baseline 1.3).  Today is day 3 of appropriate antibiotic coverage for staph simulans & strep mitis/oralis PNA. CT w/o contrast 9/8 significant for bilateral ethmoid and sphenoid sinusitis. WBC count is trending up and patient continues to have persistent fevers. 9/9 CXR shows no new or focal airspace opacity. Per Duwayne Heck, sputum looked like Pseudomonas and wanted gram-negative coverage. Patient will start meropenem for HCAP.   Plan: Decrease vancomycin to 1250 mg IV q24h.  Goal AUC 400-550. Expected AUC: 443.6, trough 13.8 SCr used: 3.05  Initiate meropenem 1g Q12H (due to CrCl between 25-50 mL/min)  Will continue to follow daily serum creatinines per protocol and adjust therapy accordingly.   Height: 5' 9.02" (175.3 cm) Weight: (!) 477 lb (216.4 kg) IBW/kg (Calculated) : 70.74  Temp (24hrs), Avg:102.8 F (39.3 C), Min:101 F (38.3 C), Max:104 F (40 C)  Recent Labs  Lab 11/24/18 0410 11/25/18 0445 11/25/18 0958 11/25/18 1250 11/26/18 0455 11/27/18 0411 11/27/18 1217 11/28/18 0151  WBC 10.3 8.7 10.2  --  8.9 12.5*  --  13.3*  CREATININE 1.87*  --   --  2.26* 2.25* 2.21*  --  3.05*  VANCORANDOM  --   --   --   --   --   --  13  --     Estimated Creatinine Clearance: 47.6 mL/min (A) (by C-G formula based on SCr of 3.05 mg/dL (H)).    No Known Allergies  Antimicrobials this admission: Cefepime 9/3 >> 9/8 Vancomycin 9/7 >>  Meropenem 9/9 >>  Dose adjustments this  admission:  9/9: Vancomycin decreased from 1750 mg q24h to 1250 mg q24h due to Scr bump   Microbiology results: 9/9 respiratory culture: pending 9/9 respiratory panel: pending 9/7 Bcx: NGTD 9/3 BAL: Staph simulans & Strep mitis/oralis, vancomycin susceptible 8/29 Bcx: No growth 8/28 Ucx: No growth 8/28 MRSA PCR: negative  Thank you for allowing pharmacy to be a part of this patient's care.  Desarea Ohagan Dear Nicholes Mango, pharmacy student 11/28/2018 12:05 PM

## 2018-11-28 NOTE — Progress Notes (Signed)
Nutrition Follow-up  RD working remotely.  DOCUMENTATION CODES:   Morbid obesity  INTERVENTION:  Continue Vital High Protein at 45 mL/hr (1080 mL goal daily volume) + Pro-Stat 60 mL TID per tube. Provides 1680 kcal, 185 grams of protein, 907 mL H2O daily.  Continue liquid MVI daily per tube.  With free water flush of 200 mL Q4hrs patient is receiving a total of 2107 mL H2O enterally daily including water in tube feeding.  NUTRITION DIAGNOSIS:   Inadequate oral intake related to inability to eat as evidenced by NPO status.  Ongoing - addressing with TF regimen.  GOAL:   Provide needs based on ASPEN/SCCM guidelines  Met with TF regimen.  MONITOR:   Vent status, Labs, Weight trends, TF tolerance, Skin, I & O's  REASON FOR ASSESSMENT:   Ventilator    ASSESSMENT:   58 year old male with PMHx of CHF, A-fib, DM, gout, OSA, HTN admitted with acute hypoxic hypercarbic respiratory failure, bilateral pulmonary effusions, pulmonary edema, compressive atelectasis, was intubated 8/28-8/30, then re-intubated on 31.  Patient remains intubated and sedated. On PCV with FiO2 60%, Pressure Control 15 cmH2O, and PEEP 15 cmH2O. Abdomen distended per RN documentation. Last BM 8/28.  Enteral Access: OGT placed 8/31; still terminates in stomach per chest x-ray today; 78 cm at corner of mouth  MAP: 70-92 mmHg  TF: Vital High Protein at 45 mL/hr + Pro-Stat 60 mL TID + free water flush 200 mL Q4hrs  [name] currently intubated on ventilator support Ve: 15.8 L/min Temp (24hrs), Avg:102.3 F (39.1 C), Min:100.9 F (38.3 C), Max:103.7 F (39.8 C)  Propofol: N/A  Medications reviewed and include: allopurinol, Levemir 10 units BID, Solu-Medrol 40 mg BID IV, liquid MVI daily per tube, Protonix, Miralax, Seroquel, senna-docusate 2 tablets BID, NS at 10 mL/hr, amiodarone gtt, Precedex gtt, heparin gtt, regular insulin gtt, meropenem, sodium bicarbonate gtt, vancomycin.  Labs reviewed: CBG  248-351, Sodium 146, Chloride 116, CO2 19, BUN 98, Creatinine 3.05.  I/O: 1500 mL UOP yesterday (0.3 mL/kg/hr)  Weight trend: 216.4 kg on 9/9; trending up from 173 kg on 8/28  Diet Order:   Diet Order            Diet NPO time specified  Diet effective now             EDUCATION NEEDS:   No education needs have been identified at this time  Skin:  Skin Assessment: Skin Integrity Issues:(MSAD to abdomen)  Last BM:  11/12/2018 per chart  Height:   Ht Readings from Last 1 Encounters:  11/21/18 5' 9.02" (1.753 m)   Weight:   Wt Readings from Last 1 Encounters:  11/28/18 (!) 216.4 kg   Ideal Body Weight:  72.8 kg  BMI:  Body mass index is 70.41 kg/m.  Estimated Nutritional Needs:   Kcal:  1602-1820 (22-25 kcal/kg IBW)  Protein:  182 grams (2.5 grams/kg IBW)  Fluid:  2-2.2 L/day  Willey Blade, MS, RD, LDN Office: [phone] Pager: [phone] After Hours/Weekend Pager: [phone]

## 2018-11-28 NOTE — Progress Notes (Signed)
Pharmacy Electrolyte Monitoring Consult:  Pharmacy consulted to assist in monitoring and replacing electrolytes in this 58 y.o. male admitted on 11/10/2018. [name] currently intubated and sedated on dexmedetomidine and hydromorphone infusion.   Labs:  Sodium (mmol/L)  Date Value  11/28/2018 146 (H)   Potassium (mmol/L)  Date Value  11/28/2018 4.1   Magnesium (mg/dL)  Date Value  11/26/2018 2.6 (H)   Phosphorus (mg/dL)  Date Value  11/26/2018 4.2   Calcium (mg/dL)  Date Value  11/28/2018 7.7 (L)   Albumin (g/dL)  Date Value  11/25/2018 2.6 (L)   Corrected Calcium: 8.8  Assessment/Plan: 1. Electrolytes: sodium slightly above normal limits and stable. Free water flushes at 200 mL q4h (increaed 9/8). Patient now on sodium bicarb drip. BMP with am labs.  9/9 1606 K = 4.1. Order for K at 1800 was d/ced by RN/MD. Recheck K at 0000- per messaging-NP will order. On insulin drip.   2. Constipation: last documented bowel movement 8/28. Continue senna/docusate 2 tabs BID. Miralax VT Daily. Bisacodyl suppository x 1 on 9/8. No effect. Will order another bisacodyl suppository x1 today.   3. Glucose: Persistent hyperglycemia patient now on a insulin drip.. On methylprednisolone 40 mg BID since 8/31.   Pharmacy will continue to monitor and adjust per consult.   Chinita Greenland PharmD Clinical Pharmacist 11/28/2018

## 2018-11-28 NOTE — Plan of Care (Signed)
  Problem: Health Behavior/Discharge Planning: Goal: Ability to manage health-related needs will improve Outcome: Not Progressing   Problem: Clinical Measurements: Goal: Ability to maintain clinical measurements within normal limits will improve Outcome: Not Progressing Goal: Will remain free from infection Outcome: Not Progressing Goal: Diagnostic test results will improve Outcome: Not Progressing Goal: Respiratory complications will improve Outcome: Not Progressing Goal: Cardiovascular complication will be avoided Outcome: Not Progressing   Problem: Activity: Goal: Risk for activity intolerance will decrease Outcome: Not Progressing   Problem: Coping: Goal: Level of anxiety will decrease Outcome: Not Progressing   Problem: Elimination: Goal: Will not experience complications related to bowel motility Outcome: Not Progressing Goal: Will not experience complications related to urinary retention Outcome: Not Progressing   Problem: Pain Managment: Goal: General experience of comfort will improve Outcome: Not Progressing

## 2018-11-28 NOTE — Progress Notes (Signed)
[facility]  ROUNDING NOTE   Subjective:  Critically ill. Intubated, sedated.  Tmax 104  Creatinine 3.05 (2.21) (2.25) UOP 1545m.   Objective:  Vital signs in last 24 hours:  Temp:  [101 F (38.3 C)-104 F (40 C)] 101 F (38.3 C) (09/09 0800) Pulse Rate:  [50-125] 123 (09/09 0900) Resp:  [13-27] 18 (09/09 0920) BP: (73-110)/(49-82) 100/78 (09/09 0900) SpO2:  [92 %-100 %] 94 % (09/09 1138) FiO2 (%):  [40 %-60 %] 60 % (09/09 1138) Weight:  [216.4 kg] 216.4 kg (09/09 0451)  Weight change: 2.722 kg Filed Weights   11/26/18 0312 11/27/18 0456 11/28/18 0451  Weight: (!) 210.9 kg (!) 213.6 kg (!) 216.4 kg    Intake/Output: I/O last 3 completed shifts: In: 6760.4 [I.V.:3021.1; NG/GT:2639.3; IV Piggyback:1100] Out: 2700 [Urine:2700]   Intake/Output this shift:  Total I/O In: 156.5 [I.V.:156.5] Out: 400 [Urine:400]  Physical Exam: General: Critically ill   Head: ETT  Eyes: Closed  Neck: Obese  Lungs:  Diminished bilaterally. Pressure Control FiO2 60%  Heart: irregular  Abdomen:  obese  Extremities: + peripheral edema.  Neurologic: Intubated, sedated  Skin: No lesions       Basic Metabolic Panel: Recent Labs  Lab 11/22/18 0429 11/22/18 2205  11/24/18 0410 11/25/18 1250 11/26/18 0455 11/27/18 0411 11/28/18 0151  NA 141  --    < > 145 145 145 147* 146*  K 4.7 5.5*   < > 4.6 4.1 4.1 4.0 4.6  CL 103  --    < > 105 112* 115* 118* 116*  CO2 28  --    < > 28 20* 19* 17* 19*  GLUCOSE 304*  --    < > 276* 377* 420* 405* 405*  BUN 65*  --    < > 77* 79* 81* 86* 98*  CREATININE 1.51*  --    < > 1.87* 2.26* 2.25* 2.21* 3.05*  CALCIUM 8.7*  --    < > 8.4* 8.5* 8.2* 8.0* 7.7*  MG 3.1* 3.3*  --  2.7*  --  2.6*  --   --   PHOS 5.3*  --   --  3.3 3.5 4.2  --   --    < > = values in this interval not displayed.    Liver Function Tests: Recent Labs  Lab 11/25/18 1250  ALBUMIN 2.6*   No results for input(s): LIPASE, AMYLASE in the last 168 hours. No  results for input(s): AMMONIA in the last 168 hours.  CBC: Recent Labs  Lab 11/22/18 0429 11/23/18 0447 11/24/18 0410 11/25/18 0445 11/25/18 0958 11/26/18 0455 11/27/18 0411 11/28/18 0151  WBC 7.2 7.5 10.3 8.7 10.2 8.9 12.5* 13.3*  NEUTROABS 6.3 6.5 8.8*  --   --   --   --   --   HGB 14.9 13.8 14.9 14.6 14.7 13.8 13.7 12.5*  HCT 46.4 44.9 47.2 46.1 46.1 43.8 43.2 41.5  MCV 93.5 97.4 96.1 92.9 93.1 94.4 94.1 99.8  PLT 156 140* 139* 127* 124* 107* 89* 72*    Cardiac Enzymes: No results for input(s): CKTOTAL, CKMB, CKMBINDEX, TROPONINI in the last 168 hours.  BNP: Invalid input(s): POCBNP  CBG: Recent Labs  Lab 11/28/18 0805 11/28/18 0852 11/28/18 0955 11/28/18 1059 11/28/18 1159  GLUCAP 351* 325* 327* 336* 305*    Microbiology: Results for orders placed or performed during the hospital encounter of 11/05/2018  SARS CORONAVIRUS 2 (TAT 6-12 HRS) Nasal Swab Aptima Multi Swab  Status: None   Collection Time: 11/18/2018  4:55 PM   Specimen: Aptima Multi Swab; Nasal Swab  Result Value Ref Range Status   SARS Coronavirus 2 NEGATIVE NEGATIVE Final    Comment: (NOTE) SARS-CoV-2 target nucleic acids are NOT DETECTED. The SARS-CoV-2 RNA is generally detectable in upper and lower respiratory specimens during the acute phase of infection. Negative results do not preclude SARS-CoV-2 infection, do not rule out co-infections with other pathogens, and should not be used as the sole basis for treatment or other patient management decisions. Negative results must be combined with clinical observations, patient history, and epidemiological information. The expected result is Negative. Fact Sheet for Patients: SugarRoll.be Fact Sheet for Healthcare Providers: https://www.woods-mathews.com/ This test is not yet approved or cleared by the [place] FDA and  has been authorized for detection and/or diagnosis of SARS-CoV-2 by FDA under an  Emergency Use Authorization (EUA). This EUA will remain  in effect (meaning this test can be used) for the duration of the COVID-19 declaration under Section 56 4(b)(1) of the Act, 21 U.S.C. section 360bbb-3(b)(1), unless the authorization is terminated or revoked sooner. Performed at [facility], [place] [address], [place], [place] [address]   SARS Coronavirus 2 [facility] order, Performed in [facility] hospital lab) Nasopharyngeal Nasopharyngeal Swab     Status: None   Collection Time: 10/29/2018 10:34 PM   Specimen: Nasopharyngeal Swab  Result Value Ref Range Status   SARS Coronavirus 2 NEGATIVE NEGATIVE Final    Comment: (NOTE) If result is NEGATIVE SARS-CoV-2 target nucleic acids are NOT DETECTED. The SARS-CoV-2 RNA is generally detectable in upper and lower  respiratory specimens during the acute phase of infection. The lowest  concentration of SARS-CoV-2 viral copies this assay can detect is 250  copies / mL. A negative result does not preclude SARS-CoV-2 infection  and should not be used as the sole basis for treatment or other  patient management decisions.  A negative result may occur with  improper specimen collection / handling, submission of specimen other  than nasopharyngeal swab, presence of viral mutation(s) within the  areas targeted by this assay, and inadequate number of viral copies  (<250 copies / mL). A negative result must be combined with clinical  observations, patient history, and epidemiological information. If result is POSITIVE SARS-CoV-2 target nucleic acids are DETECTED. The SARS-CoV-2 RNA is generally detectable in upper and lower  respiratory specimens dur ing the acute phase of infection.  Positive  results are indicative of active infection with SARS-CoV-2.  Clinical  correlation with patient history and other diagnostic information is  necessary to determine patient infection status.  Positive results do  not rule out bacterial infection or  co-infection with other viruses. If result is PRESUMPTIVE POSTIVE SARS-CoV-2 nucleic acids MAY BE PRESENT.   A presumptive positive result was obtained on the submitted specimen  and confirmed on repeat testing.  While 2019 novel coronavirus  (SARS-CoV-2) nucleic acids may be present in the submitted sample  additional confirmatory testing may be necessary for epidemiological  and / or clinical management purposes  to differentiate between  SARS-CoV-2 and other Sarbecovirus currently known to infect humans.  If clinically indicated additional testing with an alternate test  methodology [phone]) is advised. The SARS-CoV-2 RNA is generally  detectable in upper and lower respiratory sp ecimens during the acute  phase of infection. The expected result is Negative. Fact Sheet for Patients:  StrictlyIdeas.no Fact Sheet for Healthcare Providers: BankingDealers.co.za This test is  not yet approved or cleared by the [place] and has been authorized for detection and/or diagnosis of SARS-CoV-2 by FDA under an Emergency Use Authorization (EUA).  This EUA will remain in effect (meaning this test can be used) for the duration of the COVID-19 declaration under Section 564(b)(1) of the Act, 21 U.S.C. section 360bbb-3(b)(1), unless the authorization is terminated or revoked sooner. Performed at [facility], [address] [place], [place] [address]   Culture, respiratory (non-expectorated)     Status: None   Collection Time: 11/05/2018 10:34 PM   Specimen: Tracheal Aspirate; Respiratory  Result Value Ref Range Status   Specimen Description   Final    TRACHEAL ASPIRATE Performed at [facility], [address] [place], [place] [address]    Special Requests   Final    NONE Performed at [facility], [address] [place], [place] [address]    Gram Stain   Final    RARE WBC PRESENT, PREDOMINANTLY  PMN ABUNDANT GRAM POSITIVE COCCI IN CHAINS    Culture   Final    Consistent with normal respiratory flora. Performed at [facility], [place] [address] [place], [place] [address]    Report Status 11/19/2018 FINAL  Final  Urine Culture     Status: None   Collection Time: 10/26/2018 11:12 PM   Specimen: Urine, Random  Result Value Ref Range Status   Specimen Description   Final    URINE, RANDOM Performed at [facility], [address] [place], [place] [address]    Special Requests   Final    NONE Performed at [facility], [address], [place], [place] [address]    Culture   Final    NO GROWTH Performed at [facility], [place] [address], [place], [place] [address]    Report Status 11/18/2018 FINAL  Final  MRSA PCR Screening     Status: None   Collection Time: 11/01/2018 11:28 PM   Specimen: Nasopharyngeal  Result Value Ref Range Status   MRSA by PCR NEGATIVE NEGATIVE Final    Comment:        The GeneXpert MRSA Assay (FDA approved for NASAL specimens only), is one component of a comprehensive MRSA colonization surveillance program. It is not intended to diagnose MRSA infection nor to guide or monitor treatment for MRSA infections. Performed at [facility], [address] [place], [place] [address]   Culture, blood (Routine X 2) w Reflex to ID Panel     Status: None   Collection Time: 11/17/18 12:19 AM   Specimen: BLOOD  Result Value Ref Range Status   Specimen Description BLOOD RIGHT ANTECUBITAL  Final   Special Requests   Final    BOTTLES DRAWN AEROBIC AND ANAEROBIC Blood Culture adequate volume   Culture   Final    NO GROWTH 5 DAYS Performed at [facility], [address], [place], [place] [address]    Report Status 11/22/2018 FINAL  Final  Culture, blood (Routine X 2) w Reflex to ID Panel     Status: None   Collection Time: 11/17/18 12:31 AM   Specimen: BLOOD  Result Value Ref Range Status   Specimen  Description BLOOD CENTRAL LINE MIDLINE  Final   Special Requests   Final    BOTTLES DRAWN AEROBIC AND ANAEROBIC Blood Culture adequate volume   Culture   Final    NO GROWTH 5 DAYS Performed at [facility], [address] [place], [place] [address]    Report Status 11/22/2018 FINAL  Final  Culture, bal-quantitative     Status: Abnormal   Collection Time: 11/22/18  7:48 AM   Specimen: Bronchoalveolar Lavage; Respiratory  Result Value Ref Range Status   Specimen Description   Final    BRONCHIAL ALVEOLAR LAVAGE Performed at [facility], [address] [place], [place] [address]    Special Requests   Final    Immunocompromised Performed at [facility], [place], [place] [address]    Gram Stain   Final    FEW WBC PRESENT, PREDOMINANTLY PMN FEW GRAM POSITIVE COCCI IN PAIRS IN CLUSTERS FEW GRAM POSITIVE RODS Performed at [facility], [place] [address], [place], [place] [address]    Culture (A)  Final    >=100,000 COLONIES/mL STAPHYLOCOCCUS SIMULANS >=100,000 COLONIES/mL STREPTOCOCCUS MITIS/ORALIS    Report Status 11/25/2018 FINAL  Final   Organism ID, Bacteria STAPHYLOCOCCUS SIMULANS (A)  Final   Organism ID, Bacteria STREPTOCOCCUS MITIS/ORALIS (A)  Final      Susceptibility   Staphylococcus simulans - MIC*    CIPROFLOXACIN <=0.5 SENSITIVE Sensitive     ERYTHROMYCIN <=0.25 SENSITIVE Sensitive     GENTAMICIN <=0.5 SENSITIVE Sensitive     OXACILLIN <=0.25 SENSITIVE Sensitive     TETRACYCLINE >=16 RESISTANT Resistant     VANCOMYCIN <=0.5 SENSITIVE Sensitive     TRIMETH/SULFA <=10 SENSITIVE Sensitive     CLINDAMYCIN <=0.25 SENSITIVE Sensitive     RIFAMPIN <=0.5 SENSITIVE Sensitive     Inducible Clindamycin NEGATIVE Sensitive     * >=100,000 COLONIES/mL STAPHYLOCOCCUS SIMULANS   Streptococcus mitis/oralis - MIC*    TETRACYCLINE >=16 RESISTANT Resistant     VANCOMYCIN 0.25 SENSITIVE Sensitive     CLINDAMYCIN 0.5 INTERMEDIATE  Intermediate     * >=100,000 COLONIES/mL STREPTOCOCCUS MITIS/ORALIS  CULTURE, BLOOD (ROUTINE X 2) w Reflex to ID Panel     Status: None (Preliminary result)   Collection Time: 11/26/18 10:32 PM   Specimen: BLOOD  Result Value Ref Range Status   Specimen Description BLOOD RIGHT ANTECUBITAL  Final   Special Requests   Final    BOTTLES DRAWN AEROBIC AND ANAEROBIC Blood Culture adequate volume   Culture   Final    NO GROWTH 2 DAYS Performed at [facility], [address] [place], [place] [address]    Report Status PENDING  Incomplete  CULTURE, BLOOD (ROUTINE X 2) w Reflex to ID Panel     Status: None (Preliminary result)   Collection Time: 11/26/18 10:32 PM   Specimen: BLOOD  Result Value Ref Range Status   Specimen Description BLOOD BLOOD RIGHT HAND  Final   Special Requests   Final    BOTTLES DRAWN AEROBIC AND ANAEROBIC Blood Culture adequate volume   Culture   Final    NO GROWTH 2 DAYS Performed at [facility], [address] [place],  [address]    Report Status PENDING  Incomplete    Coagulation Studies: No results for input(s): LABPROT, INR in the last 72 hours.  Urinalysis: No results for input(s): COLORURINE, LABSPEC, PHURINE, GLUCOSEU, HGBUR, BILIRUBINUR, KETONESUR, PROTEINUR, UROBILINOGEN, NITRITE, LEUKOCYTESUR in the last 72 hours.  Invalid input(s): APPERANCEUR    Imaging: Dg Abd 1 View  Result Date: 11/26/2018 CLINICAL DATA:  ET and NG tube placement. CXR approved by MD at bedside. MD states no repeat necessary. Best attainable images due to patient conditions. EXAM: ABDOMEN - 1 VIEW  COMPARISON:  Abdominal radiograph 11/19/2018 FINDINGS: The nasogastric tube distal aspect is coiled overlying the stomach. Paucity of bowel gas. No supine evidence for free intraperitoneal air. Left retrocardiac opacity and probable small effusion. Probable right basilar atelectasis. IMPRESSION: The nasogastric tube side port projects over the stomach.  Electronically Signed   By: Audie Pinto M.D.   On: 11/26/2018 16:48   Ct Head Wo Contrast  Result Date: 11/27/2018 CLINICAL DATA:  Altered level of consciousness. EXAM: CT HEAD WITHOUT CONTRAST TECHNIQUE: Contiguous axial images were obtained from the base of the skull through the vertex without intravenous contrast. COMPARISON:  None. FINDINGS: Brain: No evidence of acute infarction, hemorrhage, hydrocephalus, extra-axial collection or mass lesion/mass effect. Vascular: No hyperdense vessel or unexpected calcification. Skull: Normal. Negative for fracture or focal lesion. Sinuses/Orbits: Fluid is noted in bilateral mastoid air cells. Bilateral ethmoid and sphenoid sinusitis is noted. Other: None. IMPRESSION: Bilateral ethmoid and sphenoid sinusitis. No acute intracranial abnormality seen. Electronically Signed   By: Marijo Conception M.D.   On: 11/27/2018 15:50   Dg Chest Port 1 View  Result Date: 11/28/2018 CLINICAL DATA:  Fever, intubation EXAM: PORTABLE CHEST 1 VIEW COMPARISON:  11/26/2018 FINDINGS: No significant interval change in AP portable chest radiograph with endotracheal tube over the mid trachea, esophagogastric tube, right neck vascular catheter, cardiomegaly, and layering small bilateral pleural effusions. No new or focal airspace opacity. IMPRESSION: No significant interval change in AP portable chest radiograph with cardiomegaly and layering small pleural effusions. No new or focal airspace opacity. Unchanged support apparatus. Electronically Signed   By: Eddie Candle M.D.   On: 11/28/2018 12:47   Dg Chest Port 1 View  Result Date: 11/26/2018 CLINICAL DATA:  ET and NG tube placement. CXR approved by MD at bedside. MD states no repeat necessary. Best attainable images due to patient conditions. EXAM: PORTABLE CHEST 1 VIEW COMPARISON:  Chest radiograph 11/24/2018, 11/22/2018 FINDINGS: Endotracheal tube tip terminates between the thoracic inlet and carina. The nasogastric tube courses  below the diaphragm with nonvisualization of the distal tip. The right central venous catheter tip projects over the SVC. Cardiomegaly. No definite pneumothorax. Poor visualization of the bilateral lung bases but suspect persistent bibasilar opacities. No acute finding in the visualized skeleton. IMPRESSION: 1. Endotracheal tube tip terminates between the thoracic inlet and carina. 2. Nasogastric tube positioning is better seen on the subsequent abdominal radiograph. 3. Poor visualization of the lung bases but suspect ongoing bibasilar pulmonary opacities. Electronically Signed   By: Audie Pinto M.D.   On: 11/26/2018 16:44     Medications:   . sodium chloride    . sodium chloride 10 mL/hr at 11/28/18 0900  . amiodarone 60 mg/hr (11/28/18 0900)  . dexmedetomidine (PRECEDEX) IV infusion 0.8 mcg/kg/hr (11/28/18 1036)  . dextrose 5 % and 0.45% NaCl Stopped (11/28/18 0748)  . feeding supplement (VITAL HIGH PROTEIN) 45 mL/hr at 11/27/18 1143  . heparin 550 Units/hr (11/28/18 0900)  . insulin 5.3 mL/hr at 11/28/18 0900  . meropenem (MERREM) IV    . phenylephrine (NEO-SYNEPHRINE) Adult infusion Stopped (11/27/18 2317)   . allopurinol  100 mg Per Tube Daily  . aspirin  81 mg Per NG tube Daily  . chlorhexidine gluconate (MEDLINE KIT)  15 mL Mouth Rinse BID  . Chlorhexidine Gluconate Cloth  6 each Topical Daily  . clonazePAM  0.25 mg Per Tube QHS  . feeding supplement (PRO-STAT SUGAR FREE 64)  60 mL Per Tube TID  . free water  200  mL Per Tube Q4H  . insulin detemir  10 Units Subcutaneous BID  . ipratropium-albuterol  3 mL Nebulization Q6H  . mouth rinse  15 mL Mouth Rinse 10 times per day  . methylPREDNISolone (SOLU-MEDROL) injection  40 mg Intravenous BID  . metoprolol tartrate  50 mg Per NG tube BID  . multivitamin  15 mL Per Tube Daily  . pantoprazole sodium  40 mg Per Tube QHS  . polyethylene glycol  17 g Per Tube Daily  . pravastatin  80 mg Per NG tube QPM  . QUEtiapine  25 mg Per  Tube QHS  . senna-docusate  2 tablet Per Tube BID  . sodium chloride flush  10-40 mL Intracatheter Q12H   sodium chloride, acetaminophen, bisacodyl, dextrose, ibuprofen, metoprolol tartrate, midazolam, ondansetron (ZOFRAN) IV, sodium chloride flush, sodium chloride flush, vecuronium  Assessment/ Plan:   Mr. Kyle White is a 58 y.o. white male with atrial fibrillation on warfarin, congestive heart failure, diabetes mellitus type 2, obstructive sleep apnea, morbid obesity, gout, mitral valve replacement who was admitted to [facility] on 11/05/2018 for acute exacerbation of COPD.  1.  Acute renal failure with metabolic acidosis on chronic kidney disease stage III: creatinine on admission of 1.31, GFR of 53.   Baseline creatinine appears to be 1.3 with an EGFR 57.  Urinalysis with proteinuria. Acute renal failure secondary to acute cardiorenal syndrome.  Nonoliguric urine output.  No indication for dialysis.  - Will start sodium bicarb gtt  2.  Acute respiratory failure: requiring mechanical ventilation. With pneumonia.  - meropenem.   3. Hypotension: off vasopressors. Restarted metoprolol.   4. Diastolic congestive heart failure: acute exacerbation.    LOS: 12 Judithann Villamar 9/9/202012:50 PM

## 2018-11-28 NOTE — Progress Notes (Signed)
[facility] [place] NAME: Kyle White    MR#:  [id number]  DATE OF BIRTH:  1960/07/14  SUBJECTIVE:  Pt is febrile,remains intubated on the ventilator, severely hypoxemic ,unsuccessful weaning trials underwent bronchoscopy 11/22/18.  Palliative care team is involved Sister [name] is following   REVIEW OF SYSTEMS:   Review of Systems  Unable to perform ROS: Intubated  Psychiatric/Behavioral: Nervous/anxious:      DRUG ALLERGIES:  No Known Allergies  VITALS:  Blood pressure 103/84, pulse (!) 123, temperature (!) 100.9 F (38.3 C), temperature source Rectal, resp. rate 20, height 5' 9.02" (1.753 m), weight (!) 216.4 kg, SpO2 98 %.  PHYSICAL EXAMINATION:   Physical Exam  GENERAL:  58 y.o.-year-old patient lying in the bed with no acute distress. Morbidly obese critically ill intubated on the vent EYES: Pupils equal, round, reactive to light and accommodation. No scleral icterus. Extraocular muscles intact.  HEENT: Head atraumatic, normocephalic. Oropharynx and nasopharynx clear.  NECK:  Supple, no jugular venous distention. No thyroid enlargement, no tenderness.  LUNGS: Mod breath sounds bilaterally, positive wheezing, rales, rhonchi. No use of accessory muscles of respiration.  CARDIOVASCULAR: S1, S2 normal. No murmurs, rubs, or gallops.  ABDOMEN: Soft, nontender, nondistended. Bowel sounds present. No organomegaly or mass.  EXTREMITIES: No cyanosis, clubbing or edema b/l.    NEUROLOGIC: sedated GCS less than 8 PSYCHIATRIC: sedated  And on the vent SKIN: No obvious rash, lesion, or ulcer.   LABORATORY PANEL:  CBC Recent Labs  Lab 11/28/18 0151  WBC 13.3*  HGB 12.5*  HCT 41.5  PLT 72*    Chemistries  Recent Labs  Lab 11/26/18 0455  11/28/18 0151  NA 145   < > 146*  K 4.1   < > 4.6  CL 115*   < > 116*  CO2 19*   < > 19*  GLUCOSE 420*   < > 405*  BUN 81*   < > 98*  CREATININE 2.25*   < > 3.05*  CALCIUM 8.2*   <  > 7.7*  MG 2.6*  --   --    < > = values in this interval not displayed.   Cardiac Enzymes No results for input(s): TROPONINI in the last 168 hours. RADIOLOGY:  Dg Abd 1 View  Result Date: 11/26/2018 CLINICAL DATA:  ET and NG tube placement. CXR approved by MD at bedside. MD states no repeat necessary. Best attainable images due to patient conditions. EXAM: ABDOMEN - 1 VIEW COMPARISON:  Abdominal radiograph 11/19/2018 FINDINGS: The nasogastric tube distal aspect is coiled overlying the stomach. Paucity of bowel gas. No supine evidence for free intraperitoneal air. Left retrocardiac opacity and probable small effusion. Probable right basilar atelectasis. IMPRESSION: The nasogastric tube side port projects over the stomach. Electronically Signed   By: Audie Pinto M.D.   On: 11/26/2018 16:48   Ct Head Wo Contrast  Result Date: 11/27/2018 CLINICAL DATA:  Altered level of consciousness. EXAM: CT HEAD WITHOUT CONTRAST TECHNIQUE: Contiguous axial images were obtained from the base of the skull through the vertex without intravenous contrast. COMPARISON:  None. FINDINGS: Brain: No evidence of acute infarction, hemorrhage, hydrocephalus, extra-axial collection or mass lesion/mass effect. Vascular: No hyperdense vessel or unexpected calcification. Skull: Normal. Negative for fracture or focal lesion. Sinuses/Orbits: Fluid is noted in bilateral mastoid air cells. Bilateral ethmoid and sphenoid sinusitis is noted. Other: None. IMPRESSION: Bilateral ethmoid and sphenoid sinusitis. No acute intracranial abnormality seen. Electronically Signed  By: Marijo Conception M.D.   On: 11/27/2018 15:50   Dg Chest Port 1 View  Result Date: 11/28/2018 CLINICAL DATA:  Fever, intubation EXAM: PORTABLE CHEST 1 VIEW COMPARISON:  11/26/2018 FINDINGS: No significant interval change in AP portable chest radiograph with endotracheal tube over the mid trachea, esophagogastric tube, right neck vascular catheter, cardiomegaly, and  layering small bilateral pleural effusions. No new or focal airspace opacity. IMPRESSION: No significant interval change in AP portable chest radiograph with cardiomegaly and layering small pleural effusions. No new or focal airspace opacity. Unchanged support apparatus. Electronically Signed   By: Eddie Candle M.D.   On: 11/28/2018 12:47   Dg Chest Port 1 View  Result Date: 11/26/2018 CLINICAL DATA:  ET and NG tube placement. CXR approved by MD at bedside. MD states no repeat necessary. Best attainable images due to patient conditions. EXAM: PORTABLE CHEST 1 VIEW COMPARISON:  Chest radiograph 11/24/2018, 11/22/2018 FINDINGS: Endotracheal tube tip terminates between the thoracic inlet and carina. The nasogastric tube courses below the diaphragm with nonvisualization of the distal tip. The right central venous catheter tip projects over the SVC. Cardiomegaly. No definite pneumothorax. Poor visualization of the bilateral lung bases but suspect persistent bibasilar opacities. No acute finding in the visualized skeleton. IMPRESSION: 1. Endotracheal tube tip terminates between the thoracic inlet and carina. 2. Nasogastric tube positioning is better seen on the subsequent abdominal radiograph. 3. Poor visualization of the lung bases but suspect ongoing bibasilar pulmonary opacities. Electronically Signed   By: Audie Pinto M.D.   On: 11/26/2018 16:44   ASSESSMENT AND PLAN:  Kyle White  is a 58 y.o. male with a known history of atrial fibrillation on Coumadin, CHF, diabetes mellitus, and obstructive sleep apnea with CPAP use at home, morbid obesity with sedentary lifestyle on oxygen at 2 L/min at home.  1.Acute on chronic  respiratory failure with severe hypoxia-ARDS with pneumonia  febrile  -No clinical improvement, unsuccessful weaning trials - Extubated on 8/31 but required reintubation, vent management per PCCM team.  -patient underwent bronchoscopy 11/22/2018. 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 -vent support --- extremely poor prognosis with high mortality and morbidity -Continue tube feeds with pro-stat -Antibiotics have been changed to meropenem and IV steroids -Precedex and Norcuron as needed for agitation  -Septic not present at the time of admission from pneumonia with ARDS Continue IV antibiotics Goal is to keep map greater than or equal to 65 IV steroids  #.Obstructive sleep apnea-- chronic  #Acute on chronic diastolic CHF, EF 55 to 123456 on echo in 2014 at [facility] -Repeat echocardiogram shows EF of 55 to 60%  # History of atrial fibrillation -Coumadin on hold and patient is on heparin drip and amiodarone  drip  #. Acute renal failure on chronic kidney disease stage III  with baseline creatinine 1.28-1.89-1.87-2.25-2.21-3.05 -Repeat BMP in the a.m. and continue to monitor renal function closely -Nephrology is following -[name] started on sodium bicarbonate drip -No indication for hemodialysis at this point  -Renal ultrasound if no clinical improvement  #  Type IIDiabetes mellitus -Sliding scale insulin -Hemoglobin A1c 7.3  # Generalized weakness -Physical therapy eval once extubated  #Adult failure to thrive Palliative care team is involved and talking to patient's sister Ms. [name] she is in denial, does not think patient is on life support and believes that patient is clinically improving.  Follow-up with palliative care   Overall poor prognosis. Appreciate palliative care input.  Discussed with intensivist Dr.  Patsey Berthold  CODE STATUS: DNR  DVT Prophylaxis: Lovenox  TOTAL TIME TAKING CARE OF THIS PATIENT:32 minutes.  >50% time spent on counselling and coordination of care  POSSIBLE D/C IN *?* DAYS, DEPENDING ON CLINICAL CONDITION.  Note: This dictation was prepared with Dragon dictation along with smaller phrase technology. Any transcriptional errors that result from this process are unintentional. No friends or family members at  bedside  Nicholes Mango M.D on 11/28/2018 at 1:48 PM  Between 7am to 6pm - Pager - [phone]  After 6pm go to [email or web address] - password [email or web address] [facility]  Office  [phone]  CC: Primary care physician; Inc, [facility] ServicesPatient ID: Kyle White, male   DOB: 12-Apr-1960, 58 y.o.   MRN: [id number]

## 2018-11-28 NOTE — Progress Notes (Signed)
Pharmacy Electrolyte Monitoring Consult:  Pharmacy consulted to assist in monitoring and replacing electrolytes in this 58 y.o. male admitted on 10/20/2018. [name] currently intubated and sedated on dexmedetomidine and hydromorphone infusion.   Labs:  Sodium (mmol/L)  Date Value  11/28/2018 146 (H)   Potassium (mmol/L)  Date Value  11/28/2018 4.6   Magnesium (mg/dL)  Date Value  11/26/2018 2.6 (H)   Phosphorus (mg/dL)  Date Value  11/26/2018 4.2   Calcium (mg/dL)  Date Value  11/28/2018 7.7 (L)   Albumin (g/dL)  Date Value  11/25/2018 2.6 (L)   Corrected Calcium: 8.8  Assessment/Plan: 1. Electrolytes: sodium slightly above normal limits and stable. Free water flushes at 200 mL q4h (increaed 9/8). Patient now on sodium bicarb drip. BMP with am labs.   2. Constipation: last documented bowel movement 8/28. Continue senna/docusate 2 tabs BID. Miralax VT Daily. Bisacodyl suppository x 1 on 9/8. No effect. Will order another bisacodyl suppository x1 today.   3. Glucose: Persistent hyperglycemia patient now on a insulin drip.. On methylprednisolone 40 mg BID since 8/31.   Pharmacy will continue to monitor and adjust per consult.   [place] Resident 11/28/2018 7:29 AM

## 2018-11-29 ENCOUNTER — Other Ambulatory Visit: Payer: Medicare Other

## 2018-11-29 ENCOUNTER — Inpatient Hospital Stay: Payer: Medicare Other

## 2018-11-29 DIAGNOSIS — N183 Chronic kidney disease, stage 3 (moderate): Secondary | ICD-10-CM

## 2018-11-29 DIAGNOSIS — I5033 Acute on chronic diastolic (congestive) heart failure: Secondary | ICD-10-CM

## 2018-11-29 DIAGNOSIS — J9621 Acute and chronic respiratory failure with hypoxia: Secondary | ICD-10-CM

## 2018-11-29 DIAGNOSIS — E662 Morbid (severe) obesity with alveolar hypoventilation: Secondary | ICD-10-CM

## 2018-11-29 DIAGNOSIS — J9622 Acute and chronic respiratory failure with hypercapnia: Secondary | ICD-10-CM

## 2018-11-29 DIAGNOSIS — N170 Acute kidney failure with tubular necrosis: Secondary | ICD-10-CM

## 2018-11-29 DIAGNOSIS — R0902 Hypoxemia: Secondary | ICD-10-CM

## 2018-11-29 LAB — GLUCOSE, CAPILLARY
Glucose-Capillary: 126 mg/dL — ABNORMAL HIGH (ref 70–99)
Glucose-Capillary: 127 mg/dL — ABNORMAL HIGH (ref 70–99)
Glucose-Capillary: 132 mg/dL — ABNORMAL HIGH (ref 70–99)
Glucose-Capillary: 136 mg/dL — ABNORMAL HIGH (ref 70–99)
Glucose-Capillary: 144 mg/dL — ABNORMAL HIGH (ref 70–99)
Glucose-Capillary: 144 mg/dL — ABNORMAL HIGH (ref 70–99)
Glucose-Capillary: 146 mg/dL — ABNORMAL HIGH (ref 70–99)
Glucose-Capillary: 157 mg/dL — ABNORMAL HIGH (ref 70–99)
Glucose-Capillary: 158 mg/dL — ABNORMAL HIGH (ref 70–99)
Glucose-Capillary: 161 mg/dL — ABNORMAL HIGH (ref 70–99)
Glucose-Capillary: 161 mg/dL — ABNORMAL HIGH (ref 70–99)
Glucose-Capillary: 165 mg/dL — ABNORMAL HIGH (ref 70–99)

## 2018-11-29 LAB — BASIC METABOLIC PANEL
Anion gap: 10 (ref 5–15)
BUN: 126 mg/dL — ABNORMAL HIGH (ref 6–20)
CO2: 22 mmol/L (ref 22–32)
Calcium: 7.7 mg/dL — ABNORMAL LOW (ref 8.9–10.3)
Chloride: 115 mmol/L — ABNORMAL HIGH (ref 98–111)
Creatinine, Ser: 3.17 mg/dL — ABNORMAL HIGH (ref 0.61–1.24)
GFR calc Af Amer: 24 mL/min — ABNORMAL LOW (ref >60)
GFR calc non Af Amer: 20 mL/min — ABNORMAL LOW (ref >60)
Glucose, Bld: 154 mg/dL — ABNORMAL HIGH (ref 70–99)
Potassium: 4.3 mmol/L (ref 3.5–5.1)
Sodium: 147 mmol/L — ABNORMAL HIGH (ref 135–145)

## 2018-11-29 LAB — CBC
HCT: 41.2 % (ref 39.0–52.0)
Hemoglobin: 12.9 g/dL — ABNORMAL LOW (ref 13.0–17.0)
MCH: 29.9 pg (ref 26.0–34.0)
MCHC: 31.3 g/dL (ref 30.0–36.0)
MCV: 95.6 fL (ref 80.0–100.0)
Platelets: 57 10*3/uL — ABNORMAL LOW (ref 150–400)
RBC: 4.31 MIL/uL (ref 4.22–5.81)
RDW: 13.1 % (ref 11.5–15.5)
WBC: 11.7 10*3/uL — ABNORMAL HIGH (ref 4.0–10.5)
nRBC: 1.3 % — ABNORMAL HIGH (ref 0.0–0.2)

## 2018-11-29 LAB — HEPARIN LEVEL (UNFRACTIONATED): Heparin Unfractionated: 0.13 IU/mL — ABNORMAL LOW (ref 0.30–0.70)

## 2018-11-29 LAB — VANCOMYCIN, RANDOM: Vancomycin Rm: 28

## 2018-11-29 MED ORDER — FENTANYL CITRATE (PF) 100 MCG/2ML IJ SOLN
INTRAMUSCULAR | Status: AC
  Filled 2018-11-29: qty 2

## 2018-11-29 MED ORDER — BISACODYL 10 MG RE SUPP
10.0000 mg | Freq: Once | RECTAL | Status: AC
  Administered 2018-11-29: 10 mg via RECTAL
  Filled 2018-11-29: qty 1

## 2018-11-29 MED ORDER — SODIUM CHLORIDE 0.9 % IV SOLN: 250.0000 mL | INTRAVENOUS | Status: DC | PRN

## 2018-11-29 MED ORDER — SODIUM CHLORIDE 0.9% FLUSH: 3.0000 mL | INTRAVENOUS | Status: DC | PRN

## 2018-11-29 MED ORDER — DEXTROSE 10 % IV SOLN: INTRAVENOUS | Status: DC | PRN

## 2018-11-29 MED ORDER — FENTANYL CITRATE (PF) 100 MCG/2ML IJ SOLN
100.0000 ug | Freq: Once | INTRAMUSCULAR | Status: AC
  Administered 2018-11-29: 100 ug via INTRAVENOUS

## 2018-11-29 MED ORDER — INSULIN ASPART 100 UNIT/ML ~~LOC~~ SOLN
6.0000 [IU] | SUBCUTANEOUS | Status: DC
  Administered 2018-11-29 – 2018-11-30 (×5): 6 [IU] via SUBCUTANEOUS
  Filled 2018-11-29 (×3): qty 1

## 2018-11-29 MED ORDER — INSULIN ASPART 100 UNIT/ML ~~LOC~~ SOLN
3.0000 [IU] | SUBCUTANEOUS | Status: DC
  Administered 2018-11-29: 19:00:00 3 [IU] via SUBCUTANEOUS
  Administered 2018-11-29: 21:00:00 6 [IU] via SUBCUTANEOUS
  Administered 2018-11-30 (×2): 9 [IU] via SUBCUTANEOUS
  Administered 2018-11-30: 08:00:00 6 [IU] via SUBCUTANEOUS
  Filled 2018-11-29 (×5): qty 1

## 2018-11-29 MED ORDER — POTASSIUM CHLORIDE 20 MEQ/15ML (10%) PO SOLN
40.0000 meq | Freq: Once | ORAL | Status: AC
  Administered 2018-11-29: 03:00:00 40 meq via ORAL
  Filled 2018-11-29: qty 30

## 2018-11-29 MED ORDER — SODIUM CHLORIDE 0.9 % IV SOLN
500.0000 mg | Freq: Every day | INTRAVENOUS | Status: DC
  Filled 2018-11-29: qty 0.5

## 2018-11-29 MED ORDER — FLEET ENEMA 7-19 GM/118ML RE ENEM
1.0000 | ENEMA | Freq: Once | RECTAL | Status: AC
  Administered 2018-11-30: 1 via RECTAL

## 2018-11-29 MED ORDER — MIDAZOLAM HCL 2 MG/2ML IJ SOLN
2.0000 mg | Freq: Once | INTRAMUSCULAR | Status: AC
  Administered 2018-11-29: 15:00:00 2 mg via INTRAVENOUS

## 2018-11-29 MED ORDER — INSULIN DETEMIR 100 UNIT/ML ~~LOC~~ SOLN
18.0000 [IU] | Freq: Two times a day (BID) | SUBCUTANEOUS | Status: DC
  Administered 2018-11-29: 17:00:00 18 [IU] via SUBCUTANEOUS
  Filled 2018-11-29 (×3): qty 0.18

## 2018-11-29 MED ORDER — POLYETHYLENE GLYCOL 3350 17 G PO PACK
17.0000 g | PACK | Freq: Two times a day (BID) | ORAL | Status: DC
  Administered 2018-11-29 – 2018-12-04 (×11): 17 g
  Filled 2018-11-29 (×11): qty 1

## 2018-11-29 NOTE — Progress Notes (Addendum)
Pharmacy Electrolyte Monitoring Consult:  Pharmacy consulted to assist in monitoring and replacing electrolytes in this 58 y.o. male admitted on 10/25/2018. [name] currently intubated and sedated on dexmedetomidine and hydromorphone infusion.   Labs:  Sodium (mmol/L)  Date Value  11/29/2018 147 (H)   Potassium (mmol/L)  Date Value  11/29/2018 4.3   Magnesium (mg/dL)  Date Value  11/26/2018 2.6 (H)   Phosphorus (mg/dL)  Date Value  11/26/2018 4.2   Calcium (mg/dL)  Date Value  11/29/2018 7.7 (L)   Albumin (g/dL)  Date Value  11/25/2018 2.6 (L)   Corrected Calcium: 8.8  Assessment/Plan: 1. Electrolytes: sodium slightly above normal limits and stable. Free water flushes at 200 mL q4h (increaed 9/8). Patient now on sodium bicarb drip discontinued. BMP with am labs.   2. Constipation: last documented bowel movement 8/28. Continue senna/docusate 2 tabs BID. Miralax increased to BID VT. He has received Bisacodyl suppositories x2 with no effect. Ordered another suppository today along with a fleets enema.   3. Glucose: Persistent hyperglycemia patient now on a insulin drip. Glucose control improved on drip. On methylprednisolone 40 mg BID since 8/31.   Pharmacy will continue to monitor and adjust per consult.   [place] Resident 11/29/2018 12:03 PM

## 2018-11-29 NOTE — Progress Notes (Signed)
[facility]  ROUNDING NOTE   Subjective:  Critically ill. Intubated, sedated.  Tmax 100.9  UOP 1245m.   BUN 126    Objective:  Vital signs in last 24 hours:  Temp:  [98.1 F (36.7 C)-100.4 F (38 C)] 98.1 F (36.7 C) (09/10 0752) Pulse Rate:  [85-128] 124 (09/10 1200) Resp:  [14-25] 22 (09/10 1200) BP: (87-118)/(63-88) 95/73 (09/10 1200) SpO2:  [78 %-96 %] 84 % (09/10 1200) FiO2 (%):  [50 %-70 %] 70 % (09/10 0730)  Weight change:  Filed Weights   11/26/18 0312 11/27/18 0456 11/28/18 0451  Weight: (!) 210.9 kg (!) 213.6 kg (!) 216.4 kg    Intake/Output: I/O last 3 completed shifts: In: 7085.1 [I.V.:3654.9; NG/GT:2480; IV Piggyback:950.2] Out: 1625 [Urine:1625]   Intake/Output this shift:  Total I/O In: 1199.8 [NG/GT:1199.8] Out: 275 [Urine:275]  Physical Exam: General: Critically ill   Head: ETT  Eyes: Closed  Neck: Obese  Lungs:  Diminished bilaterally. Pressure Control FiO2 70%  Heart: irregular  Abdomen:  obese  Extremities: + peripheral edema.  Neurologic: Intubated, sedated  Skin: No lesions       Basic Metabolic Panel: Recent Labs  Lab 11/22/18 2205  11/24/18 0410 11/25/18 1250 11/26/18 0455 11/27/18 0411 11/28/18 0151 11/28/18 1606 11/28/18 2311 11/29/18 0504  NA  --    < > 145 145 145 147* 146*  --   --  147*  K 5.5*   < > 4.6 4.1 4.1 4.0 4.6 4.1 3.9 4.3  CL  --    < > 105 112* 115* 118* 116*  --   --  115*  CO2  --    < > 28 20* 19* 17* 19*  --   --  22  GLUCOSE  --    < > 276* 377* 420* 405* 405*  --   --  154*  BUN  --    < > 77* 79* 81* 86* 98*  --   --  126*  CREATININE  --    < > 1.87* 2.26* 2.25* 2.21* 3.05*  --   --  3.17*  CALCIUM  --    < > 8.4* 8.5* 8.2* 8.0* 7.7*  --   --  7.7*  MG 3.3*  --  2.7*  --  2.6*  --   --   --   --   --   PHOS  --   --  3.3 3.5 4.2  --   --   --   --   --    < > = values in this interval not displayed.    Liver Function Tests: Recent Labs  Lab 11/25/18 1250  ALBUMIN 2.6*    No results for input(s): LIPASE, AMYLASE in the last 168 hours. No results for input(s): AMMONIA in the last 168 hours.  CBC: Recent Labs  Lab 11/23/18 0447 11/24/18 0410  11/25/18 0958 11/26/18 0455 11/27/18 0411 11/28/18 0151 11/29/18 0504  WBC 7.5 10.3   < > 10.2 8.9 12.5* 13.3* 11.7*  NEUTROABS 6.5 8.8*  --   --   --   --   --   --   HGB 13.8 14.9   < > 14.7 13.8 13.7 12.5* 12.9*  HCT 44.9 47.2   < > 46.1 43.8 43.2 41.5 41.2  MCV 97.4 96.1   < > 93.1 94.4 94.1 99.8 95.6  PLT 140* 139*   < > 124* 107* 89* 72* 57*   < > = values  in this interval not displayed.    Cardiac Enzymes: No results for input(s): CKTOTAL, CKMB, CKMBINDEX, TROPONINI in the last 168 hours.  BNP: Invalid input(s): POCBNP  CBG: Recent Labs  Lab 11/29/18 0620 11/29/18 0724 11/29/18 0837 11/29/18 0931 11/29/18 1137  GLUCAP 126* 132* 144* 158* 161*    Microbiology: Results for orders placed or performed during the hospital encounter of 11/13/2018  SARS CORONAVIRUS 2 (TAT 6-12 HRS) Nasal Swab Aptima Multi Swab     Status: None   Collection Time: 10/26/2018  4:55 PM   Specimen: Aptima Multi Swab; Nasal Swab  Result Value Ref Range Status   SARS Coronavirus 2 NEGATIVE NEGATIVE Final    Comment: (NOTE) SARS-CoV-2 target nucleic acids are NOT DETECTED. The SARS-CoV-2 RNA is generally detectable in upper and lower respiratory specimens during the acute phase of infection. Negative results do not preclude SARS-CoV-2 infection, do not rule out co-infections with other pathogens, and should not be used as the sole basis for treatment or other patient management decisions. Negative results must be combined with clinical observations, patient history, and epidemiological information. The expected result is Negative. Fact Sheet for Patients: SugarRoll.be Fact Sheet for Healthcare Providers: https://www.woods-mathews.com/ This test is not yet approved or  cleared by the [place] FDA and  has been authorized for detection and/or diagnosis of SARS-CoV-2 by FDA under an Emergency Use Authorization (EUA). This EUA will remain  in effect (meaning this test can be used) for the duration of the COVID-19 declaration under Section 56 4(b)(1) of the Act, 21 U.S.C. section 360bbb-3(b)(1), unless the authorization is terminated or revoked sooner. Performed at [facility], [place] [address] [place], [place] [address]   SARS Coronavirus 2 [facility] order, Performed in [facility] hospital lab) Nasopharyngeal Nasopharyngeal Swab     Status: None   Collection Time: 11/04/2018 10:34 PM   Specimen: Nasopharyngeal Swab  Result Value Ref Range Status   SARS Coronavirus 2 NEGATIVE NEGATIVE Final    Comment: (NOTE) If result is NEGATIVE SARS-CoV-2 target nucleic acids are NOT DETECTED. The SARS-CoV-2 RNA is generally detectable in upper and lower  respiratory specimens during the acute phase of infection. The lowest  concentration of SARS-CoV-2 viral copies this assay can detect is 250  copies / mL. A negative result does not preclude SARS-CoV-2 infection  and should not be used as the sole basis for treatment or other  patient management decisions.  A negative result may occur with  improper specimen collection / handling, submission of specimen other  than nasopharyngeal swab, presence of viral mutation(s) within the  areas targeted by this assay, and inadequate number of viral copies  (<250 copies / mL). A negative result must be combined with clinical  observations, patient history, and epidemiological information. If result is POSITIVE SARS-CoV-2 target nucleic acids are DETECTED. The SARS-CoV-2 RNA is generally detectable in upper and lower  respiratory specimens dur ing the acute phase of infection.  Positive  results are indicative of active infection with SARS-CoV-2.  Clinical  correlation with patient history and other diagnostic  information is  necessary to determine patient infection status.  Positive results do  not rule out bacterial infection or co-infection with other viruses. If result is PRESUMPTIVE POSTIVE SARS-CoV-2 nucleic acids MAY BE PRESENT.   A presumptive positive result was obtained on the submitted specimen  and confirmed on repeat testing.  While 2019 novel coronavirus  (SARS-CoV-2) nucleic acids may be present in the submitted sample  additional confirmatory  testing may be necessary for epidemiological  and / or clinical management purposes  to differentiate between  SARS-CoV-2 and other Sarbecovirus currently known to infect humans.  If clinically indicated additional testing with an alternate test  methodology [phone] is advised. The SARS-CoV-2 RNA is generally  detectable in upper and lower respiratory sp ecimens during the acute  phase of infection. The expected result is Negative. Fact Sheet for Patients:  StrictlyIdeas.no Fact Sheet for Healthcare Providers: BankingDealers.co.za This test is not yet approved or cleared by the [place] FDA and has been authorized for detection and/or diagnosis of SARS-CoV-2 by FDA under an Emergency Use Authorization (EUA).  This EUA will remain in effect (meaning this test can be used) for the duration of the COVID-19 declaration under Section 564(b)(1) of the Act, 21 U.S.C. section 360bbb-3(b)(1), unless the authorization is terminated or revoked sooner. Performed at [facility], [address] [place], [place] [address]   Culture, respiratory (non-expectorated)     Status: None   Collection Time: 10/29/2018 10:34 PM   Specimen: Tracheal Aspirate; Respiratory  Result Value Ref Range Status   Specimen Description   Final    TRACHEAL ASPIRATE Performed at [facility], [address] [place], [place] [address]    Special Requests   Final    NONE Performed at [facility], [address] [place], [place] [address]    Gram Stain   Final    RARE WBC PRESENT, PREDOMINANTLY PMN ABUNDANT GRAM POSITIVE COCCI IN CHAINS    Culture   Final    Consistent with normal respiratory flora. Performed at [facility], [place] [address] [place], [place] [address]    Report Status 11/19/2018 FINAL  Final  Urine Culture     Status: None   Collection Time: 10/26/2018 11:12 PM   Specimen: Urine, Random  Result Value Ref Range Status   Specimen Description   Final    URINE, RANDOM Performed at [facility], [address], [place], [place] [address]    Special Requests   Final    NONE Performed at [facility], [address], [place], [place] [address]    Culture   Final    NO GROWTH Performed at [facility], [place] [address], [place], [place] [address]    Report Status 11/18/2018 FINAL  Final  MRSA PCR Screening     Status: None   Collection Time: 10/22/2018 11:28 PM   Specimen: Nasopharyngeal  Result Value Ref Range Status   MRSA by PCR NEGATIVE NEGATIVE Final    Comment:        The GeneXpert MRSA Assay (FDA approved for NASAL specimens only), is one component of a comprehensive MRSA colonization surveillance program. It is not intended to diagnose MRSA infection nor to guide or monitor treatment for MRSA infections. Performed at [facility], [address] [place], [place] [address]   Culture, blood (Routine X 2) w Reflex to ID Panel     Status: None   Collection Time: 11/17/18 12:19 AM   Specimen: BLOOD  Result Value Ref Range Status   Specimen Description BLOOD RIGHT ANTECUBITAL  Final   Special Requests   Final    BOTTLES DRAWN AEROBIC AND ANAEROBIC Blood Culture adequate volume   Culture   Final    NO GROWTH 5 DAYS Performed at [facility], [address], [place], [place] [address]    Report Status 11/22/2018 FINAL  Final  Culture, blood (Routine X 2) w Reflex to ID Panel      Status: None   Collection Time: 11/17/18 12:31 AM   Specimen: BLOOD  Result Value Ref Range Status   Specimen Description BLOOD CENTRAL LINE MIDLINE  Final   Special Requests   Final    BOTTLES DRAWN AEROBIC AND ANAEROBIC Blood Culture adequate volume   Culture   Final    NO GROWTH 5 DAYS Performed at [facility], [address], [place], [place] [address]    Report Status 11/22/2018 FINAL  Final  Culture, bal-quantitative     Status: Abnormal   Collection Time: 11/22/18  7:48 AM   Specimen: Bronchoalveolar Lavage; Respiratory  Result Value Ref Range Status   Specimen Description   Final    BRONCHIAL ALVEOLAR LAVAGE Performed at [facility], [address] [place], [place] [address]    Special Requests   Final    Immunocompromised Performed at [facility], [place], [place] [address]    Gram Stain   Final    FEW WBC PRESENT, PREDOMINANTLY PMN FEW GRAM POSITIVE COCCI IN PAIRS IN CLUSTERS FEW GRAM POSITIVE RODS Performed at [facility], [place] [address] [place], [place] [address]    Culture (A)  Final    >=100,000 COLONIES/mL STAPHYLOCOCCUS SIMULANS >=100,000 COLONIES/mL STREPTOCOCCUS MITIS/ORALIS    Report Status 11/25/2018 FINAL  Final   Organism ID, Bacteria STAPHYLOCOCCUS SIMULANS (A)  Final   Organism ID, Bacteria STREPTOCOCCUS MITIS/ORALIS (A)  Final      Susceptibility   Staphylococcus simulans - MIC*    CIPROFLOXACIN <=0.5 SENSITIVE Sensitive     ERYTHROMYCIN <=0.25 SENSITIVE Sensitive     GENTAMICIN <=0.5 SENSITIVE Sensitive     OXACILLIN <=0.25 SENSITIVE Sensitive     TETRACYCLINE >=16 RESISTANT Resistant     VANCOMYCIN <=0.5 SENSITIVE Sensitive     TRIMETH/SULFA <=10 SENSITIVE Sensitive     CLINDAMYCIN <=0.25 SENSITIVE Sensitive     RIFAMPIN <=0.5 SENSITIVE Sensitive     Inducible Clindamycin NEGATIVE Sensitive     * >=100,000 COLONIES/mL STAPHYLOCOCCUS SIMULANS   Streptococcus mitis/oralis - MIC*     TETRACYCLINE >=16 RESISTANT Resistant     VANCOMYCIN 0.25 SENSITIVE Sensitive     CLINDAMYCIN 0.5 INTERMEDIATE Intermediate     * >=100,000 COLONIES/mL STREPTOCOCCUS MITIS/ORALIS  CULTURE, BLOOD (ROUTINE X 2) w Reflex to ID Panel     Status: None (Preliminary result)   Collection Time: 11/26/18 10:32 PM   Specimen: BLOOD  Result Value Ref Range Status   Specimen Description BLOOD RIGHT ANTECUBITAL  Final   Special Requests   Final    BOTTLES DRAWN AEROBIC AND ANAEROBIC Blood Culture adequate volume   Culture   Final    NO GROWTH 3 DAYS Performed at [facility], [address] [place], [place] [address]    Report Status PENDING  Incomplete  CULTURE, BLOOD (ROUTINE X 2) w Reflex to ID Panel     Status: None (Preliminary result)   Collection Time: 11/26/18 10:32 PM   Specimen: BLOOD  Result Value Ref Range Status   Specimen Description BLOOD BLOOD RIGHT HAND  Final   Special Requests   Final    BOTTLES DRAWN AEROBIC AND ANAEROBIC Blood Culture adequate volume   Culture   Final    NO GROWTH 3 DAYS Performed at [facility], [address], [place],  [address]    Report Status PENDING  Incomplete  Respiratory Panel  by PCR     Status: None   Collection Time: 11/28/18 10:36 AM   Specimen: Nasopharyngeal Swab; Respiratory  Result Value Ref Range Status   Adenovirus NOT DETECTED NOT DETECTED Final   Coronavirus 229E NOT DETECTED NOT DETECTED Final    Comment: (NOTE) The Coronavirus on the Respiratory Panel, DOES NOT test for the novel  Coronavirus (2019 nCoV)    Coronavirus HKU1 NOT DETECTED NOT DETECTED Final   Coronavirus NL63 NOT DETECTED NOT DETECTED Final   Coronavirus OC43 NOT DETECTED NOT DETECTED Final   Metapneumovirus NOT DETECTED NOT DETECTED Final   Rhinovirus / Enterovirus NOT DETECTED NOT DETECTED Final   Influenza A NOT DETECTED NOT DETECTED Final   Influenza B NOT DETECTED NOT DETECTED Final   Parainfluenza Virus 1 NOT DETECTED NOT  DETECTED Final   Parainfluenza Virus 2 NOT DETECTED NOT DETECTED Final   Parainfluenza Virus 3 NOT DETECTED NOT DETECTED Final   Parainfluenza Virus 4 NOT DETECTED NOT DETECTED Final   Respiratory Syncytial Virus NOT DETECTED NOT DETECTED Final   Bordetella pertussis NOT DETECTED NOT DETECTED Final   Chlamydophila pneumoniae NOT DETECTED NOT DETECTED Final   Mycoplasma pneumoniae NOT DETECTED NOT DETECTED Final    Comment: Performed at [facility], [place]. [address] [place], [place] [address]  Culture, respiratory (non-expectorated)     Status: None (Preliminary result)   Collection Time: 11/28/18 11:29 AM   Specimen: Tracheal Aspirate; Respiratory  Result Value Ref Range Status   Specimen Description   Final    TRACHEAL ASPIRATE Performed at [facility], [address] [place], [place] [address]    Special Requests   Final    NONE Performed at [facility], [address] [place], [place] [address]    Gram Stain   Final    FEW WBC PRESENT, PREDOMINANTLY PMN FEW SQUAMOUS EPITHELIAL CELLS PRESENT RARE GRAM POSITIVE COCCI IN PAIRS    Culture   Final    CULTURE REINCUBATED FOR BETTER GROWTH Performed at [facility], [place] [address], [place], [place] [address]    Report Status PENDING  Incomplete    Coagulation Studies: No results for input(s): LABPROT, INR in the last 72 hours.  Urinalysis: No results for input(s): COLORURINE, LABSPEC, PHURINE, GLUCOSEU, HGBUR, BILIRUBINUR, KETONESUR, PROTEINUR, UROBILINOGEN, NITRITE, LEUKOCYTESUR in the last 72 hours.  Invalid input(s): APPERANCEUR    Imaging: Ct Head Wo Contrast  Result Date: 11/27/2018 CLINICAL DATA:  Altered level of consciousness. EXAM: CT HEAD WITHOUT CONTRAST TECHNIQUE: Contiguous axial images were obtained from the base of the skull through the vertex without intravenous contrast. COMPARISON:  None. FINDINGS: Brain: No evidence of acute infarction, hemorrhage, hydrocephalus,  extra-axial collection or mass lesion/mass effect. Vascular: No hyperdense vessel or unexpected calcification. Skull: Normal. Negative for fracture or focal lesion. Sinuses/Orbits: Fluid is noted in bilateral mastoid air cells. Bilateral ethmoid and sphenoid sinusitis is noted. Other: None. IMPRESSION: Bilateral ethmoid and sphenoid sinusitis. No acute intracranial abnormality seen. Electronically Signed   By: Marijo Conception M.D.   On: 11/27/2018 15:50   Dg Chest Port 1 View  Result Date: 11/28/2018 CLINICAL DATA:  Fever, intubation EXAM: PORTABLE CHEST 1 VIEW COMPARISON:  11/26/2018 FINDINGS: No significant interval change in AP portable chest radiograph with endotracheal tube over the mid trachea, esophagogastric tube, right neck vascular catheter, cardiomegaly, and layering small bilateral pleural effusions. No new or focal airspace opacity. IMPRESSION: No significant interval change in AP portable chest radiograph with cardiomegaly  and layering small pleural effusions. No new or focal airspace opacity. Unchanged support apparatus. Electronically Signed   By: Eddie Candle M.D.   On: 11/28/2018 12:47     Medications:   . sodium chloride    . sodium chloride 10 mL/hr at 11/29/18 0100  . amiodarone 60 mg/hr (11/29/18 0814)  . dexmedetomidine (PRECEDEX) IV infusion 0.7 mcg/kg/hr (11/29/18 1109)  . feeding supplement (VITAL HIGH PROTEIN) 1,000 mL (11/28/18 1809)  . insulin 6.3 Units/hr (11/29/18 1229)  . meropenem (MERREM) IV 1 g (11/29/18 1133)  .  sodium bicarbonate (isotonic) infusion in sterile water 100 mL/hr at 11/29/18 1023  . vancomycin Stopped (11/28/18 1710)   . allopurinol  100 mg Per Tube Daily  . aspirin  81 mg Per NG tube Daily  . chlorhexidine gluconate (MEDLINE KIT)  15 mL Mouth Rinse BID  . Chlorhexidine Gluconate Cloth  6 each Topical Daily  . clonazePAM  0.25 mg Per Tube QHS  . feeding supplement (PRO-STAT SUGAR FREE 64)  60 mL Per Tube TID  . free water  200 mL Per Tube  Q4H  . ipratropium-albuterol  3 mL Nebulization Q6H  . mouth rinse  15 mL Mouth Rinse 10 times per day  . methylPREDNISolone (SOLU-MEDROL) injection  40 mg Intravenous BID  . metoprolol tartrate  50 mg Per NG tube BID  . multivitamin  15 mL Per Tube Daily  . pantoprazole sodium  40 mg Per Tube QHS  . polyethylene glycol  17 g Per Tube BID  . pravastatin  80 mg Per NG tube QPM  . QUEtiapine  25 mg Per Tube QHS  . senna-docusate  2 tablet Per Tube BID  . sodium chloride flush  10-40 mL Intracatheter Q12H  . sodium phosphate  1 enema Rectal Once   sodium chloride, acetaminophen, bisacodyl, dextrose, ibuprofen, metoprolol tartrate, midazolam, ondansetron (ZOFRAN) IV, sodium chloride flush, sodium chloride flush, vecuronium  Assessment/ Plan:   Mr. Kyle White is a 58 y.o. white male with atrial fibrillation on warfarin, congestive heart failure, diabetes mellitus type 2, obstructive sleep apnea, morbid obesity, gout, mitral valve replacement who was admitted to [facility] on 11/10/2018 for acute exacerbation of COPD.  1.  Acute renal failure with metabolic acidosis on chronic kidney disease stage III: creatinine on admission of 1.31, GFR of 53.   Baseline creatinine appears to be 1.3 with an EGFR 57.  Urinalysis with proteinuria. Acute renal failure secondary to acute cardiorenal syndrome.  Nonoliguric urine output.  Improved metabolic profile with sodium bicarb infusion - Due to uremia, will proceed with a time limited trial of hemodialysis. Discussed risks and benefits with sister and brother over the phone.   2.  Acute respiratory failure: requiring mechanical ventilation. With pneumonia. Pickwickian syndrome - meropenem.   3. Hypotension: off vasopressors. Restarted metoprolol.   4. Diastolic congestive heart failure: acute exacerbation.  - Discontinue sodium bicarb infusion.    LOS: [place] 9/10/202012:30 PM

## 2018-11-29 NOTE — Progress Notes (Signed)
Palliative: Mr. Debski remains critically ill.  There is no family at bedside at this time.  Conference with CCM attending, nephrology, and bedside nursing related to patient condition, needs.   PMT has been unable to build rapport with family and will shadow until opportunity to re-engage with family.   Plan:  Continue to treat the treatable, No CPR.   NO charge  Quinn Axe, NP Palliative Medicine Team Team Phone # [phone] Greater than 50% of this time was spent counseling and coordinating care related to the above assessment and plan.

## 2018-11-29 NOTE — Progress Notes (Signed)
eeg completed ° °

## 2018-11-29 NOTE — Progress Notes (Signed)
Follow up - Critical Care Medicine Note  Patient Details:    Kyle White is an 58 y.o. male  w/hx of morbid obesity, HFpEF hx MVR, CHF, PAF, OSA, DM, AHRF came in with acute hypoxemic hypercarbic resp failure with CXR showing b/l pl effusions and pulm edema. CT with restrictive physiology, pulm edema, bilateral pleural effusions and compressive atelectasiss/p extubation on BiPAP. Re-intubated 8/31. Severe hypoxia requiring high PEEP levels.  Lines, Airways, Drains: Airway 8.5 mm (Active)  Secured at (cm) 24 cm 11/27/18 2043  Measured From Lips 11/27/18 2043  Secured Location Right 11/27/18 2043  Secured By [employer] 11/27/18 2043  Tube Holder Repositioned Yes 11/27/18 2043  Cuff Pressure (cm H2O) 28 cm H2O 11/27/18 2043  Site Condition Dry 11/27/18 2043     CVC Triple Lumen 11/22/18 Right Internal jugular (Active)  Indication for Insertion or Continuance of Line Administration of hyperosmolar/irritating solutions (i.e. TPN, Vancomycin, etc.) 11/27/18 2000  Site Assessment Clean;Dry;Intact 11/27/18 2000  Proximal Lumen Status Infusing;Blood return noted 11/27/18 2000  Medial Lumen Status Infusing;Blood return noted 11/27/18 2000  Distal Lumen Status Blood return noted;In-line blood sampling system in place 11/27/18 2000  Dressing Type Transparent;Occlusive 11/27/18 2000  Dressing Status Clean;Dry;Intact;Antimicrobial disc in place 11/27/18 [place] checked and tightened 11/27/18 2000  Dressing Intervention Dressing reinforced 11/24/18 0727  Dressing Change Due 11/29/18 11/27/18 2000     NG/OG Tube Orogastric Center mouth Xray 78 cm (Active)  Cm Marking at Nare/Corner of Mouth (if applicable) 78 cm 02/54/27 1930  Site Assessment Clean;Dry;Intact 11/27/18 1930  Ongoing Placement Verification No change in cm markings or external length of tube from initial placement;No change in respiratory status;No acute changes, not attributed to clinical condition  11/27/18 1930  Status Infusing tube feed 11/27/18 1930  Amount of suction 120 mmHg 11/22/18 0400  Drainage Appearance Green;Brown 11/22/18 0400  Intake (mL) 40 mL 11/27/18 1742  Output (mL) 100 mL 11/21/18 1200     Urethral Catheter [name], RN Double-lumen 16 Fr. (Active)  Indication for Insertion or Continuance of Catheter Unstable critically ill patients first 24-48 hours (See Criteria) 11/27/18 1930  Site Assessment Clean;Intact 11/27/18 1930  Catheter Maintenance Bag below level of bladder;Catheter secured;Drainage bag/tubing not touching floor;Insertion date on drainage bag;No dependent loops;Seal intact;Bag emptied prior to transport 11/27/18 1930  Collection Container Standard drainage bag 11/27/18 1930  Securement Method Leg strap 11/27/18 1930  Urinary Catheter Interventions (if applicable) Unclamped 09/11/74 1930  Output (mL) 1125 mL 11/27/18 1742    Anti-infectives:  Anti-infectives (From admission, onward)   Start     Dose/Rate Route Frequency Ordered Stop   11/30/18 1800  meropenem (MERREM) 500 mg in sodium chloride 0.9 % 100 mL IVPB     500 mg 200 mL/hr over 30 Minutes Intravenous Daily-1800 11/29/18 1500     11/28/18 1530  vancomycin (VANCOCIN) 1,250 mg in sodium chloride 0.9 % 250 mL IVPB  Status:  Discontinued     1,250 mg 166.7 mL/hr over 90 Minutes Intravenous Every 24 hours 11/28/18 1510 11/29/18 1553   11/28/18 1500  vancomycin (VANCOCIN) 1,250 mg in sodium chloride 0.9 % 250 mL IVPB  Status:  Discontinued     1,250 mg 166.7 mL/hr over 90 Minutes Intravenous Every 24 hours 11/28/18 1458 11/28/18 1510   11/28/18 1215  meropenem (MERREM) 1 g in sodium chloride 0.9 % 100 mL IVPB  Status:  Discontinued     1 g 200 mL/hr over 30 Minutes Intravenous Every  12 hours 11/28/18 1206 11/29/18 1500   11/27/18 1500  vancomycin (VANCOCIN) 1,750 mg in sodium chloride 0.9 % 500 mL IVPB  Status:  Discontinued     1,750 mg 250 mL/hr over 120 Minutes Intravenous Every 24  hours 11/27/18 1355 11/28/18 0726   11/26/18 1100  vancomycin (VANCOCIN) 2,500 mg in sodium chloride 0.9 % 500 mL IVPB     2,500 mg 250 mL/hr over 120 Minutes Intravenous  Once 11/26/18 1040 11/26/18 1315   11/26/18 1058  vancomycin variable dose per unstable renal function (pharmacist dosing)  Status:  Discontinued      Does not apply See admin instructions 11/26/18 1058 11/27/18 1658   11/22/18 1015  ceFEPIme (MAXIPIME) 2 g in sodium chloride 0.9 % 100 mL IVPB  Status:  Discontinued     2 g 200 mL/hr over 30 Minutes Intravenous Every 8 hours 11/22/18 1009 11/27/18 1027      Microbiology: Results for orders placed or performed during the hospital encounter of 11/04/2018  SARS CORONAVIRUS 2 (TAT 6-12 HRS) Nasal Swab Aptima Multi Swab     Status: None   Collection Time: 10/22/2018  4:55 PM   Specimen: Aptima Multi Swab; Nasal Swab  Result Value Ref Range Status   SARS Coronavirus 2 NEGATIVE NEGATIVE Final    Comment: (NOTE) SARS-CoV-2 target nucleic acids are NOT DETECTED. The SARS-CoV-2 RNA is generally detectable in upper and lower respiratory specimens during the acute phase of infection. Negative results do not preclude SARS-CoV-2 infection, do not rule out co-infections with other pathogens, and should not be used as the sole basis for treatment or other patient management decisions. Negative results must be combined with clinical observations, patient history, and epidemiological information. The expected result is Negative. Fact Sheet for Patients: SugarRoll.be Fact Sheet for Healthcare Providers: https://www.woods-mathews.com/ This test is not yet approved or cleared by the [place] FDA and  has been authorized for detection and/or diagnosis of SARS-CoV-2 by FDA under an Emergency Use Authorization (EUA). This EUA will remain  in effect (meaning this test can be used) for the duration of the COVID-19 declaration under Section  56 4(b)(1) of the Act, 21 U.S.C. section 360bbb-3(b)(1), unless the authorization is terminated or revoked sooner. Performed at [facility], [place] [address], [place], [place] [address]   SARS Coronavirus 2 [facility] order, Performed in [facility] hospital lab) Nasopharyngeal Nasopharyngeal Swab     Status: None   Collection Time: 11/03/2018 10:34 PM   Specimen: Nasopharyngeal Swab  Result Value Ref Range Status   SARS Coronavirus 2 NEGATIVE NEGATIVE Final    Comment: (NOTE) If result is NEGATIVE SARS-CoV-2 target nucleic acids are NOT DETECTED. The SARS-CoV-2 RNA is generally detectable in upper and lower  respiratory specimens during the acute phase of infection. The lowest  concentration of SARS-CoV-2 viral copies this assay can detect is 250  copies / mL. A negative result does not preclude SARS-CoV-2 infection  and should not be used as the sole basis for treatment or other  patient management decisions.  A negative result may occur with  improper specimen collection / handling, submission of specimen other  than nasopharyngeal swab, presence of viral mutation(s) within the  areas targeted by this assay, and inadequate number of viral copies  (<250 copies / mL). A negative result must be combined with clinical  observations, patient history, and epidemiological information. If result is POSITIVE SARS-CoV-2 target nucleic acids are DETECTED. The SARS-CoV-2 RNA is generally detectable in upper  and lower  respiratory specimens dur ing the acute phase of infection.  Positive  results are indicative of active infection with SARS-CoV-2.  Clinical  correlation with patient history and other diagnostic information is  necessary to determine patient infection status.  Positive results do  not rule out bacterial infection or co-infection with other viruses. If result is PRESUMPTIVE POSTIVE SARS-CoV-2 nucleic acids MAY BE PRESENT.   A presumptive positive result was obtained on the  submitted specimen  and confirmed on repeat testing.  While 2019 novel coronavirus  (SARS-CoV-2) nucleic acids may be present in the submitted sample  additional confirmatory testing may be necessary for epidemiological  and / or clinical management purposes  to differentiate between  SARS-CoV-2 and other Sarbecovirus currently known to infect humans.  If clinically indicated additional testing with an alternate test  methodology [phone]) is advised. The SARS-CoV-2 RNA is generally  detectable in upper and lower respiratory sp ecimens during the acute  phase of infection. The expected result is Negative. Fact Sheet for Patients:  StrictlyIdeas.no Fact Sheet for Healthcare Providers: BankingDealers.co.za This test is not yet approved or cleared by the [place] FDA and has been authorized for detection and/or diagnosis of SARS-CoV-2 by FDA under an Emergency Use Authorization (EUA).  This EUA will remain in effect (meaning this test can be used) for the duration of the COVID-19 declaration under Section 564(b)(1) of the Act, 21 U.S.C. section 360bbb-3(b)(1), unless the authorization is terminated or revoked sooner. Performed at [facility], [address] [place], [place] [address]   Culture, respiratory (non-expectorated)     Status: None   Collection Time: 10/30/2018 10:34 PM   Specimen: Tracheal Aspirate; Respiratory  Result Value Ref Range Status   Specimen Description   Final    TRACHEAL ASPIRATE Performed at [facility], [address] [place], [place] [address]    Special Requests   Final    NONE Performed at [facility], [address] [place], [place] [address]    Gram Stain   Final    RARE WBC PRESENT, PREDOMINANTLY PMN ABUNDANT GRAM POSITIVE COCCI IN CHAINS    Culture   Final    Consistent with normal respiratory flora. Performed at [facility], [place] [address],  [place], [place] [address]    Report Status 11/19/2018 FINAL  Final  Urine Culture     Status: None   Collection Time: 10/21/2018 11:12 PM   Specimen: Urine, Random  Result Value Ref Range Status   Specimen Description   Final    URINE, RANDOM Performed at [facility], [address] [place], [place] [address]    Special Requests   Final    NONE Performed at [facility], [address] [place], [place] [address]    Culture   Final    NO GROWTH Performed at [facility], [place] [address] [place], [place] [address]    Report Status 11/18/2018 FINAL  Final  MRSA PCR Screening     Status: None   Collection Time: 11/05/2018 11:28 PM   Specimen: Nasopharyngeal  Result Value Ref Range Status   MRSA by PCR NEGATIVE NEGATIVE Final    Comment:        The GeneXpert MRSA Assay (FDA approved for NASAL specimens only), is one component of a comprehensive MRSA colonization surveillance program. It is not intended to diagnose MRSA infection nor to guide or monitor treatment for MRSA infections. Performed at [facility], [address] [place], [place] [address]   Culture, blood (Routine X 2) w Reflex to ID Panel     Status: None   Collection Time: 11/17/18 12:19 AM   Specimen: BLOOD  Result Value Ref Range Status   Specimen Description BLOOD RIGHT ANTECUBITAL  Final   Special Requests   Final    BOTTLES DRAWN AEROBIC AND ANAEROBIC Blood Culture adequate volume   Culture   Final    NO GROWTH 5 DAYS Performed at [facility], [address], [place], [place] [address]    Report Status 11/22/2018 FINAL  Final  Culture, blood (Routine X 2) w Reflex to ID Panel     Status: None   Collection Time: 11/17/18 12:31 AM   Specimen: BLOOD  Result Value Ref Range Status   Specimen Description BLOOD CENTRAL LINE MIDLINE  Final   Special Requests   Final    BOTTLES DRAWN AEROBIC AND ANAEROBIC Blood Culture adequate volume   Culture   Final    NO  GROWTH 5 DAYS Performed at [facility], [address], [place], [place] [address]    Report Status 11/22/2018 FINAL  Final  Culture, bal-quantitative     Status: Abnormal   Collection Time: 11/22/18  7:48 AM   Specimen: Bronchoalveolar Lavage; Respiratory  Result Value Ref Range Status   Specimen Description   Final    BRONCHIAL ALVEOLAR LAVAGE Performed at [facility], [address] [place], [place] [address]    Special Requests   Final    Immunocompromised Performed at [facility], [place], [place] [address]    Gram Stain   Final    FEW WBC PRESENT, PREDOMINANTLY PMN FEW GRAM POSITIVE COCCI IN PAIRS IN CLUSTERS FEW GRAM POSITIVE RODS Performed at [facility], [place] [address], [place], [place] [address]    Culture (A)  Final    >=100,000 COLONIES/mL STAPHYLOCOCCUS SIMULANS >=100,000 COLONIES/mL STREPTOCOCCUS MITIS/ORALIS    Report Status 11/25/2018 FINAL  Final   Organism ID, Bacteria STAPHYLOCOCCUS SIMULANS (A)  Final   Organism ID, Bacteria STREPTOCOCCUS MITIS/ORALIS (A)  Final      Susceptibility   Staphylococcus simulans - MIC*    CIPROFLOXACIN <=0.5 SENSITIVE Sensitive     ERYTHROMYCIN <=0.25 SENSITIVE Sensitive     GENTAMICIN <=0.5 SENSITIVE Sensitive     OXACILLIN <=0.25 SENSITIVE Sensitive     TETRACYCLINE >=16 RESISTANT Resistant     VANCOMYCIN <=0.5 SENSITIVE Sensitive     TRIMETH/SULFA <=10 SENSITIVE Sensitive     CLINDAMYCIN <=0.25 SENSITIVE Sensitive     RIFAMPIN <=0.5 SENSITIVE Sensitive     Inducible Clindamycin NEGATIVE Sensitive     * >=100,000 COLONIES/mL STAPHYLOCOCCUS SIMULANS   Streptococcus mitis/oralis - MIC*    TETRACYCLINE >=16 RESISTANT Resistant     VANCOMYCIN 0.25 SENSITIVE Sensitive     CLINDAMYCIN 0.5 INTERMEDIATE Intermediate     * >=100,000 COLONIES/mL STREPTOCOCCUS MITIS/ORALIS  CULTURE, BLOOD (ROUTINE X 2) w Reflex to ID Panel     Status: None (Preliminary result)   Collection  Time: 11/26/18 10:32 PM   Specimen: BLOOD  Result Value Ref Range Status   Specimen Description BLOOD RIGHT ANTECUBITAL  Final   Special Requests   Final    BOTTLES DRAWN AEROBIC AND ANAEROBIC Blood Culture adequate volume   Culture   Final    NO GROWTH 3 DAYS Performed at [facility], [address] [place], [place] [address]  Report Status PENDING  Incomplete  CULTURE, BLOOD (ROUTINE X 2) w Reflex to ID Panel     Status: None (Preliminary result)   Collection Time: 11/26/18 10:32 PM   Specimen: BLOOD  Result Value Ref Range Status   Specimen Description BLOOD BLOOD RIGHT HAND  Final   Special Requests   Final    BOTTLES DRAWN AEROBIC AND ANAEROBIC Blood Culture adequate volume   Culture   Final    NO GROWTH 3 DAYS Performed at [facility], [address] [place], [place] [address]    Report Status PENDING  Incomplete  Respiratory Panel by PCR     Status: None   Collection Time: 11/28/18 10:36 AM   Specimen: Nasopharyngeal Swab; Respiratory  Result Value Ref Range Status   Adenovirus NOT DETECTED NOT DETECTED Final   Coronavirus 229E NOT DETECTED NOT DETECTED Final    Comment: (NOTE) The Coronavirus on the Respiratory Panel, DOES NOT test for the novel  Coronavirus (2019 nCoV)    Coronavirus HKU1 NOT DETECTED NOT DETECTED Final   Coronavirus NL63 NOT DETECTED NOT DETECTED Final   Coronavirus OC43 NOT DETECTED NOT DETECTED Final   Metapneumovirus NOT DETECTED NOT DETECTED Final   Rhinovirus / Enterovirus NOT DETECTED NOT DETECTED Final   Influenza A NOT DETECTED NOT DETECTED Final   Influenza B NOT DETECTED NOT DETECTED Final   Parainfluenza Virus 1 NOT DETECTED NOT DETECTED Final   Parainfluenza Virus 2 NOT DETECTED NOT DETECTED Final   Parainfluenza Virus 3 NOT DETECTED NOT DETECTED Final   Parainfluenza Virus 4 NOT DETECTED NOT DETECTED Final   Respiratory Syncytial Virus NOT DETECTED NOT DETECTED Final   Bordetella pertussis NOT DETECTED NOT  DETECTED Final   Chlamydophila pneumoniae NOT DETECTED NOT DETECTED Final   Mycoplasma pneumoniae NOT DETECTED NOT DETECTED Final    Comment: Performed at [facility], [place] [address] [place], [place] [address]  Culture, respiratory (non-expectorated)     Status: None (Preliminary result)   Collection Time: 11/28/18 11:29 AM   Specimen: Tracheal Aspirate; Respiratory  Result Value Ref Range Status   Specimen Description   Final    TRACHEAL ASPIRATE Performed at [facility], [address], [place], [place] [address]    Special Requests   Final    NONE Performed at [facility], [place] [place], [place] [address]    Gram Stain   Final    FEW WBC PRESENT, PREDOMINANTLY PMN FEW SQUAMOUS EPITHELIAL CELLS PRESENT RARE GRAM POSITIVE COCCI IN PAIRS    Culture   Final    CULTURE REINCUBATED FOR BETTER GROWTH Performed at [facility], [place] [address], [place], [place] [address]    Report Status PENDING  Incomplete    Best Practice/Protocols:  VTE Prophylaxis: Heparin (drip) GI Prophylaxis: Proton Pump Inhibitor Continous Sedation-sedatives adjusted yesterday  Events: 8/29 admitted for severe resp failure CHF exacerbation 8/30 extubated 8/31 re-intubated, PICC line placed 9/2 severe hypoxia 9/3 RT hemithorax opacification plan for bronch, CVl placed, severe hypoxia 9/5 severe hypoxia 9/6 unable to wean from vent severe hypoxia 9/7 ETT dislodged, had to reintubate 9/8remains ventilator dependent and requiring high PEEP 9/8 CT head, no abnl except sinusitis/mastoiditis independent review 9/9 Pancultured, sputum purulent, Merrem started  Studies: Ct Abdomen Pelvis Wo Contrast  Result Date: 11/17/2018 CLINICAL DATA:  Abdominal rigidity. Concern for perforated viscus. Currently intubated. EXAM: CT ABDOMEN AND PELVIS WITHOUT CONTRAST TECHNIQUE: Multidetector CT imaging of the abdomen and pelvis was  performed following the standard protocol  without IV contrast. COMPARISON:  None. FINDINGS: Lower chest: Small right and trace left pleural effusions with bilateral lower lobe airspace disease. Small amount of herniated right middle lobe between the fifth and sixth ribs. Hepatobiliary: Severe hepatic steatosis. Several small gallstones. No gallbladder wall thickening or biliary dilatation. Pancreas: Unremarkable. No pancreatic ductal dilatation or surrounding inflammatory changes. Spleen: Normal in size without focal abnormality. Adrenals/Urinary Tract: Adrenal glands are unremarkable. Punctate calculus in the lower pole the left kidney. No hydronephrosis. Bladder is decompressed by Foley catheter. Stomach/Bowel: Enteric tube tip in the distal stomach. Stomach is within normal limits. Appendix appears normal. No evidence of bowel wall thickening, distention, or inflammatory changes. Vascular/Lymphatic: Aortic atherosclerosis. No enlarged abdominal or pelvic lymph nodes. Reproductive: Prostate is unremarkable. Other: Trace ascites.  No pneumoperitoneum. Musculoskeletal: No acute or significant osseous findings. IMPRESSION: 1.  No acute intra-abdominal process.  No perforation. 2. Trace ascites. 3. Severe hepatic steatosis. 4. Cholelithiasis. 5. Punctate nonobstructive left nephrolithiasis. 6. Small right and trace left pleural effusions. Bilateral lower lobe airspace disease may represent atelectasis, but pneumonia is difficult to exclude, particularly on the right. Electronically Signed   By: Titus Dubin M.D.   On: 11/17/2018 13:50   Dg Abd 1 View  Result Date: 11/26/2018 CLINICAL DATA:  ET and NG tube placement. CXR approved by MD at bedside. MD states no repeat necessary. Best attainable images due to patient conditions. EXAM: ABDOMEN - 1 VIEW COMPARISON:  Abdominal radiograph 11/19/2018 FINDINGS: The nasogastric tube distal aspect is coiled overlying the stomach. Paucity of bowel gas. No supine evidence for free intraperitoneal air. Left  retrocardiac opacity and probable small effusion. Probable right basilar atelectasis. IMPRESSION: The nasogastric tube side port projects over the stomach. Electronically Signed   By: Audie Pinto M.D.   On: 11/26/2018 16:48   Dg Abd 1 View  Result Date: 11/17/2018 CLINICAL DATA:  Abdomen distension tube placement EXAM: ABDOMEN - 1 VIEW COMPARISON:  10/24/2018 FINDINGS: Esophageal tube tip visible to the distal stomach but incompletely included. Airspace disease at the right base. IMPRESSION: Esophageal tubing visible to the gastric body but tip is incompletely included in the field of view. Electronically Signed   By: Donavan Foil M.D.   On: 11/17/2018 00:10   Dg Abd 1 View  Result Date: 11/11/2018 CLINICAL DATA:  Abdominal distention EXAM: ABDOMEN - 1 VIEW COMPARISON:  None. FINDINGS: Limited study due to portable nature of the study and body habitus. I see no significant bowel dilatation or free air. Study otherwise limited. IMPRESSION: Very limited study. No visible changes of bowel obstruction or free air. Electronically Signed   By: Rolm Baptise M.D.   On: 11/09/2018 22:33   Ct Head Wo Contrast  Result Date: 11/27/2018 CLINICAL DATA:  Altered level of consciousness. EXAM: CT HEAD WITHOUT CONTRAST TECHNIQUE: Contiguous axial images were obtained from the base of the skull through the vertex without intravenous contrast. COMPARISON:  None. FINDINGS: Brain: No evidence of acute infarction, hemorrhage, hydrocephalus, extra-axial collection or mass lesion/mass effect. Vascular: No hyperdense vessel or unexpected calcification. Skull: Normal. Negative for fracture or focal lesion. Sinuses/Orbits: Fluid is noted in bilateral mastoid air cells. Bilateral ethmoid and sphenoid sinusitis is noted. Other: None. IMPRESSION: Bilateral ethmoid and sphenoid sinusitis. No acute intracranial abnormality seen. Electronically Signed   By: Marijo Conception M.D.   On: 11/27/2018 15:50   US Renal  Result Date:  11/25/2018 CLINICAL DATA:  Acute renal failure. EXAM: RENAL /  URINARY TRACT ULTRASOUND COMPLETE COMPARISON:  Body CT November 17, 2018 FINDINGS: Right Kidney: Renal measurements: 12.2 x 6.9 x 7.5 cm = volume: 334 mL . Echogenicity within normal limits. No mass or hydronephrosis visualized. Left Kidney: Renal measurements: 14.9 x 6.5 x 6.7 cm = volume: 341 mL. Echogenicity within normal limits. No mass or hydronephrosis visualized. Bladder: Decompressed around urinary Foley and therefore not well evaluated. IMPRESSION: Normal appearance of the kidneys, accounting for the technical limitations of the study caused by body habitus. Electronically Signed   By: Fidela Salisbury M.D.   On: 11/25/2018 15:43   Dg Chest Port 1 View  Result Date: 11/29/2018 CLINICAL DATA:  Central line placement. EXAM: PORTABLE CHEST 1 VIEW COMPARISON:  11/28/2018 FINDINGS: [name] rotated to the left. Endotracheal tube has tip 6.8 cm above the carina. Enteric tube courses into the stomach as tip is not visualized. Right IJ central venous catheter unchanged with tip over the SVC. Interval placement of left IJ central venous catheter with tip obliquely oriented over the left brachiocephalic vein in the midline. Lungs are adequately inflated with mild stable hazy density over the left base likely small left effusion with associated atelectasis. Mild stable prominence of the perihilar markings likely mild vascular congestion. Moderate stable cardiomegaly. Remainder of the exam is unchanged. IMPRESSION: Moderate stable cardiomegaly with mild vascular congestion. Stable hazy density over the left base likely small effusion with associated atelectasis. Tubes and lines as described. Interval placement of left IJ central venous catheter with tip in the midline over the brachiocephalic vein. Electronically Signed   By: Marin Olp M.D.   On: 11/29/2018 15:44   Dg Chest Port 1 View  Result Date: 11/28/2018 CLINICAL DATA:  Fever, intubation  EXAM: PORTABLE CHEST 1 VIEW COMPARISON:  11/26/2018 FINDINGS: No significant interval change in AP portable chest radiograph with endotracheal tube over the mid trachea, esophagogastric tube, right neck vascular catheter, cardiomegaly, and layering small bilateral pleural effusions. No new or focal airspace opacity. IMPRESSION: No significant interval change in AP portable chest radiograph with cardiomegaly and layering small pleural effusions. No new or focal airspace opacity. Unchanged support apparatus. Electronically Signed   By: Eddie Candle M.D.   On: 11/28/2018 12:47   Dg Chest Port 1 View  Result Date: 11/26/2018 CLINICAL DATA:  ET and NG tube placement. CXR approved by MD at bedside. MD states no repeat necessary. Best attainable images due to patient conditions. EXAM: PORTABLE CHEST 1 VIEW COMPARISON:  Chest radiograph 11/24/2018, 11/22/2018 FINDINGS: Endotracheal tube tip terminates between the thoracic inlet and carina. The nasogastric tube courses below the diaphragm with nonvisualization of the distal tip. The right central venous catheter tip projects over the SVC. Cardiomegaly. No definite pneumothorax. Poor visualization of the bilateral lung bases but suspect persistent bibasilar opacities. No acute finding in the visualized skeleton. IMPRESSION: 1. Endotracheal tube tip terminates between the thoracic inlet and carina. 2. Nasogastric tube positioning is better seen on the subsequent abdominal radiograph. 3. Poor visualization of the lung bases but suspect ongoing bibasilar pulmonary opacities. Electronically Signed   By: Audie Pinto M.D.   On: 11/26/2018 16:44   Dg Chest Port 1 View  Result Date: 11/24/2018 CLINICAL DATA:  Shortness of breath EXAM: PORTABLE CHEST 1 VIEW COMPARISON:  November 22, 2018 FINDINGS: Endotracheal tube tip is seen 3.5 cm above the level of carina. NG tube is seen coursing below the diaphragm. A right-sided central venous catheter is seen within the mid  SVC./slight interval improved  aeration since prior exam. Probable subsegmental atelectasis remains at the lung bases. There is a retrocardiac opacity. IMPRESSION: Slight interval improvement in aeration. However there remains a retrocardiac opacity which could be atelectasis and/or infectious etiology. Electronically Signed   By: Prudencio Pair M.D.   On: 11/24/2018 03:51   Dg Chest Port 1 View  Result Date: 11/22/2018 CLINICAL DATA:  Ventilator support.  Follow-up. EXAM: PORTABLE CHEST 1 VIEW COMPARISON:  Earlier same day FINDINGS: Endotracheal tube tip is 3 cm above the carina. Orogastric or nasogastric tube enters the abdomen. Right internal jugular central line tip in the SVC above the right atrium. Persistent bilateral lower lung atelectasis and or pneumonia. No worsening or new finding. IMPRESSION: No apparent change since earlier today. Lines and tubes well position. Persistent atelectasis and or pneumonia in the mid and lower lungs. Electronically Signed   By: Nelson Chimes M.D.   On: 11/22/2018 21:23   Dg Chest Port 1 View  Result Date: 11/22/2018 CLINICAL DATA:  Central line placement EXAM: PORTABLE CHEST 1 VIEW COMPARISON:  11/22/2018 FINDINGS: [name] rotated. Interval placement of a right IJ central venous catheter with distal tip terminating at the expected level of the superior cavoatrial junction. ET tube remains in place with distal tip terminating 3.8 cm superior to the carina. Enteric tube courses below the diaphragm with distal tip beyond the inferior margin of the film. Multiple overlying external leads. Cardiac silhouette is largely obscured but appears enlarged. Bilateral pleural effusions. Volume loss within the right lung. Slight improved aeration of the right lung compared to prior. No pneumothorax visualized. IMPRESSION: 1. Interval placement of right IJ central venous catheter. No pneumothorax. 2. Improving aeration of the right lung. Electronically Signed   By: Davina Poke  M.D.   On: 11/22/2018 14:26   Dg Chest Port 1 View  Result Date: 11/22/2018 CLINICAL DATA:  Multiple tracheobronchial mucus plugs, history CHF, diabetes mellitus, atrial fibrillation, hypertension EXAM: PORTABLE CHEST 1 VIEW COMPARISON:  Portable exam 0830 hours compared to 0035 hours FINDINGS: Tip of endotracheal tube projects 4.1 cm above carina. Nasogastric tube extends into abdomen. Volume loss in the RIGHT hemithorax with slight mediastinal shift to the RIGHT, likely reflecting a combination of atelectasis and question effusion. LEFT pleural effusion and basilar atelectasis are present. No pneumothorax. Bones demineralized. IMPRESSION: Atelectasis and probable pleural effusion opacified RIGHT hemithorax with volume loss. Persistent LEFT pleural effusion and basilar atelectasis. Electronically Signed   By: Lavonia Dana M.D.   On: 11/22/2018 08:44   Dg Chest Port 1 View  Result Date: 11/22/2018 CLINICAL DATA:  Hypoxia. EXAM: PORTABLE CHEST 1 VIEW COMPARISON:  Radiograph yesterday at 0550 hour FINDINGS: Endotracheal tube tip 3.6 cm from the carina. Enteric tube in place tip not well visualized. Complete opacification of the right hemithorax which is new from prior exam. Heart size and mediastinal contours are obscured. Left pleural effusion with volume loss in the left lung. Congestive changes on the left. IMPRESSION: 1. Complete opacification of the right hemithorax which is new from prior exam. Question underlying mucous plugging. 2. Overall low lung volumes. 3. Support apparatus unchanged. Electronically Signed   By: Keith Rake M.D.   On: 11/22/2018 01:11   Dg Chest Port 1 View  Result Date: 11/21/2018 CLINICAL DATA:  Acute respiratory failure. EXAM: PORTABLE CHEST 1 VIEW COMPARISON:  Radiograph of November 19, 2018. FINDINGS: Stable cardiomegaly. Endotracheal and nasogastric tubes are unchanged in position. No pneumothorax is noted. Probable mild to moderate size left pleural  effusion is noted with  associated atelectasis or infiltrate. Minimal right basilar atelectasis may be present. Bony thorax unremarkable. IMPRESSION: Stable support apparatus. Increased left basilar density is noted concerning for mild to moderate pleural effusion with probable underlying atelectasis or infiltrate. Electronically Signed   By: Marijo Conception M.D.   On: 11/21/2018 08:39   Dg Chest Port 1 View  Result Date: 11/19/2018 CLINICAL DATA:  Respiratory failure. EXAM: PORTABLE CHEST 1 VIEW COMPARISON:  11/08/2018 prior radiographs FINDINGS: An endotracheal tube with tip 4 cm above the carina and NG tube entering the stomach with tip off the field of view again noted. Cardiomegaly, pulmonary vascular congestion, small bilateral pleural effusions and bilateral LOWER lung atelectasis/consolidation again noted. There is no evidence of pneumothorax. IMPRESSION: Unchanged appearance of the chest with support apparatus as described. Cardiomegaly, pulmonary vascular congestion, small bilateral pleural effusions and bilateral LOWER lung atelectasis/consolidation. Electronically Signed   By: Margarette Canada M.D.   On: 11/19/2018 16:51   Dg Chest Port 1 View  Result Date: 11/17/2018 CLINICAL DATA:  Intubation EXAM: PORTABLE CHEST 1 VIEW COMPARISON:  11/17/2018, 10/24/2018 FINDINGS: Endotracheal tube tip is about 3.1 cm superior to the carina. Esophageal tube tip extends below the diaphragm but is non included. Bilateral pleural effusions, likely layering on the right. Cardiomegaly with vascular congestion and bilateral pulmonary edema. Basilar consolidations. No pneumothorax. IMPRESSION: 1. Endotracheal tube tip about 3.1 cm superior to carina 2. Cardiomegaly with vascular congestion, pulmonary edema and bilateral pleural effusion Electronically Signed   By: Donavan Foil M.D.   On: 11/17/2018 00:12   Dg Chest Port 1 View  Result Date: 11/15/2018 CLINICAL DATA:  Shortness of breath for 2 weeks. EXAM: PORTABLE CHEST 1 VIEW COMPARISON:   11/04/2010 radiograph FINDINGS: Cardiomegaly with pulmonary vascular congestion noted. Possible mild interstitial opacities/edema noted. No pneumothorax or acute bony abnormality. Bibasilar atelectasis noted. There may be trace pleural effusions present. IMPRESSION: Cardiomegaly with pulmonary vascular congestion and possible mild interstitial edema. Bibasilar atelectasis.  Possible trace pleural effusions. Electronically Signed   By: Margarette Canada M.D.   On: 11/09/2018 17:13   Dg Abd Portable 1v  Result Date: 11/19/2018 CLINICAL DATA:  NG tube placement. EXAM: PORTABLE ABDOMEN - 1 VIEW COMPARISON:  None. FINDINGS: An NG tube is noted with tip overlying the distal stomach. IMPRESSION: NG tube with tip overlying the distal stomach. Electronically Signed   By: Margarette Canada M.D.   On: 11/19/2018 16:51    Consults: Treatment Team:  Corey Skains, MD   Subjective:    Overnight Issues: Fever has subsided on vancomycin and meropenem.  Does open eyes, still exhibiting some nystagmus.  EEG to be performed today.   Objective:  Vital signs for last 24 hours: Temp:  [97.6 F (36.4 C)-100.4 F (38 C)] 97.6 F (36.4 C) (09/10 1845) Pulse Rate:  [85-128] 99 (09/10 1845) Resp:  [14-25] 16 (09/10 1900) BP: (87-118)/(64-88) 102/76 (09/10 1900) SpO2:  [78 %-98 %] 96 % (09/10 1930) FiO2 (%):  [50 %-70 %] 70 % (09/10 1930)  Hemodynamic parameters for last 24 hours:    Intake/Output from previous day: 09/09 0701 - 09/10 0700 In: 4453.5 [I.V.:2708.3; NG/GT:1295; IV Piggyback:450.2] Out: 1200 [Urine:1200]  Intake/Output this shift: No intake/output data recorded.  Vent settings for last 24 hours: Vent Mode: PCV FiO2 (%):  [50 %-70 %] 70 % Set Rate:  [15 bmp] 15 bmp PEEP:  [15 cmH20] 15 cmH20  Physical Exam:   GENERAL:critically ill appearing, synchronous with  the ventilator, opening eyes more readily but not following commands. HEAD: Normocephalic, atraumatic.  EYES: Pupils equal, round,  reactive to light. No scleral icterus.  MOUTH: Orotracheally intubated.   NECK: Supple.  PULMONARY: +rhonchi, no wheezes, no rales. CARDIOVASCULAR: S1 and S2. Regular rate and rhythm. No murmurs, rubs, or gallops.  GASTROINTESTINAL: Soft,protuberant/obese. Positive bowel sounds. MUSCULOSKELETAL: No cyanosis, clubbing, or edema.  NEUROLOGIC: opens eyes, nystagmus still present, not following commands, does not track SKIN:intact,warm,dry  Assessment/Plan:   1. Acute on chronic hypoxic/hypercapnic respiratory failure:Bilateral pleural effusions with pulmonary edema due to worsening renal failure, underlying extreme obesity with obesity hypoventilation and severe OSA. Patient also with pneumonia and compressive atelectasis of the lower lobes. Ventilator changes made as appropriate. Titrate O2 and PEEP for saturations of 88 to 92%.Bronchodilator therapy/pulmonary toilet.  No vent changes needed today.  2. Acute diastolic heart failure decompensation:The patient will be placed on dialysis and hopefully this will help with volume overload from diastolic dysfunction.  3. Acute kidney failuresuperimposed on chronic kidney disease stage III: Appreciate nephrology's input.  Has associated metabolic acidosis, bicarbonate drip started by Dr. Juleen China yesterday. The patient will require dialysis.  [name] prognosis is very poor though he is DNR, family still wishes to continue current course of therapy.  Continue Foley catheter due to need for accurate I&O's. Nonoliguric urine output at present. Has met criteria for dialysis. Careful dosing of medications according to GFR.  4. Altered mental status/obtundation/ventilator associated discomfort:Will adjust sedatives and analgesics and wean off Versed and Dilaudid. Decrease sedatives attempt to achieve RASSof -1.  EEG will be ordered given nystagmus noted on exam, rule out subclinical status.  5. Pneumonia with Staph simulans and Strep  mitis: Vancomycin per pharmacy.  Because of persistent fever and purulent sputum, he was recultured yesterday.  Respiratory viral panel negative.  Sputum culture pending.  On meropenem.  Possible sources of fever could be sinusitis/mastoiditis.  Discussed with palliative care.Patient remains DNR.  Overall prognosis is exceedingly poor.    LOS: 13 days   Additional comments:Multidisciplinary rounds were performed today. Discussed with Dr. Juleen China, nephrology, the patient will require dialysis.  Also discussed with Dr. Alexis Goodell with regards to EEG.   Critical Care Total Time*: 35 Minutes  C. Derrill Kay, MD [facility] 11/29/2018  *Care during the described time interval was provided by me and/or other providers on the critical care team.  I have reviewed this patient's available data, including medical history, events of note, physical examination and test results as part of my evaluation.

## 2018-11-29 NOTE — Progress Notes (Signed)
ANTICOAGULATION CONSULT NOTE   Pharmacy Consult for Heparin  Indication: atrial fibrillation  No Known Allergies  Patient Measurements: Height: 5' 9.02" (175.3 cm) Weight: (!) 477 lb (216.4 kg) IBW/kg (Calculated) : 70.74 Heparin Dosing Weight: 123 kg   Vital Signs: Temp: 98.1 F (36.7 C) (09/10 0752) Temp Source: Oral (09/10 0752) BP: 97/72 (09/10 1100) Pulse Rate: 96 (09/10 1100)  Labs: Recent Labs    11/27/18 0411  11/27/18 1937 11/28/18 0151 11/29/18 0504  HGB 13.7  --   --  12.5* 12.9*  HCT 43.2  --   --  41.5 41.2  PLT 89*  --   --  72* 57*  HEPARINUNFRC 1.03*   < > 0.68 0.57 0.13*  CREATININE 2.21*  --   --  3.05* 3.17*   < > = values in this interval not displayed.    Estimated Creatinine Clearance: 45.8 mL/min (A) (by C-G formula based on SCr of 3.17 mg/dL (H)).   Medical History: Past Medical History:  Diagnosis Date  . Atrial fibrillation ([place])   . CHF (congestive heart failure) ([place])   . Diabetes mellitus without complication ([place])   . Gout   . Hypertension associated with type 2 diabetes mellitus ([place])   . Morbid obesity ([place])   . Obstructive sleep apnea     Medications:  Medications Prior to Admission  Medication Sig Dispense Refill Last Dose  . aspirin EC 81 MG tablet Take 81 mg by mouth daily.   11/15/2018 at Unknown time  . colchicine 0.6 MG tablet Take 12 mg by mouth. At first sing of gout flare   prn at prn  . diltiazem (CARDIZEM CD) 240 MG 24 hr capsule Take 240 mg by mouth daily.   11/15/2018 at Unknown time  . furosemide (LASIX) 40 MG tablet Take 40 mg by mouth 2 (two) times daily.   11/15/2018 at Unknown time  . lisinopril (ZESTRIL) 5 MG tablet Take 5 mg by mouth daily.   11/15/2018 at Unknown time  . metFORMIN (GLUCOPHAGE) 500 MG tablet Take 500 mg by mouth 2 (two) times daily with a meal.   11/15/2018 at Unknown time  . metoprolol tartrate (LOPRESSOR) 100 MG tablet Take 50 mg by mouth 2 (two) times daily.   11/15/2018 at Unknown time  .  pravastatin (PRAVACHOL) 80 MG tablet Take 80 mg by mouth every evening.   11/15/2018 at Unknown time  . warfarin (COUMADIN) 4 MG tablet Take 2.5-4 mg by mouth daily. Take 4 mg on Sunday, Tuesday, Wednesday, Thursday, and Saturday. Take 2.5 mg on Monday and Friday   11/15/2018 at Unknown time    Assessment: Pharmacy consulted to initiate Heparin Drip on 58yo patient admitted with acute exacerbation of CHF. Patient diagnosed with nonvalvular atrial fibrillation with acute onset of severe shortness of breath, weakness and fatigue, and hypoxia over the past week. Patient takes warfarin PTA with schedule being 4mg  SuTWThSa, and 2.5mg  MF. Last dose of warfarin was on 8/29 and patient was transitioned to a heparin drip on 9/3 due to suspected non-correlation of INRs.    Goal of Therapy:  Heparin level 0.3-0.7 units/ml Monitor platelets by anticoagulation protocol: Yes   Plan:  09/10 @ 0504 HL 0.13, subtherapeutic. Per [facility] and after speaking with nurse it does not appear that the infusion was stopped at any point yesterday or this morning. Clear downtrend >50% in platelets since start of heparin drip from 150s to 57 today. Corresponding, but to a lesser extent, decrease in Hgb  since initiation of drip from 14.9 to 12.9 today. No indication of bleeding per chart. Plan to hold heparin today due to thrombocytopenia. Will follow up CBC tomorrow morning   [place] Resident 11/29/2018 11:43 AM

## 2018-11-29 NOTE — Procedures (Signed)
ELECTROENCEPHALOGRAM REPORT   Patient: Kyle White       Room #: IC05A-AA EEG No. ID: [id number] Age: 58 y.o.        Sex: male Referring Physician: Gouru Report Date:  11/29/2018        Interpreting Physician: Alexis Goodell  History: [name] is an 58 y.o. male with respiratory failure  Medications:  Zyloprim, ASA, Klonopin, Fentanyl, Merrem, Lopressor, Protonix, Seroquel, Vancomycin, Pravachol  Conditions of Recording:  This is a 21 channel routine scalp EEG performed with bipolar and monopolar montages arranged in accordance to the international 10/20 system of electrode placement. One channel was dedicated to EKG recording.  The patient is in the poorly unresponsive state.  Description:  ELECTROENCEPHALOGRAM REPORT   Patient: Kyle White       Room #: IC05A-AA EEG No. ID: [id number] Age: 58 y.o.        Sex: male Referring Physician: Gouru Report Date:  11/29/2018        Interpreting Physician: Alexis Goodell  History: [name] is an 58 y.o. male in a comatose state  Medications:  Precedex, Vancomycin, Insulin, Seroquel, Pravachol, Merrem, Lopressor, Klonopin, ASA  Conditions of Recording:  This is a 21 channel routine scalp EEG performed with bipolar and monopolar montages arranged in accordance to the international 10/20 system of electrode placement. One channel was dedicated to EKG recording.  The patient is in the unresponsive state.  Description:  Artifact is prominent during the recording often obscuring the background rhythm particularly over the left hemisphere.  When able to be visualized the background is slow and poorly organized with a mixture of very low voltage theta and delta activity noted.  No epileptiform activity is noted.  Hyperventilation and iIntermittent photic stimulation were not performed but failed to illicit any change in the tracing.  (and elicits a symmetrical driving response but fails to elicit any  abnormalities.)  IMPRESSION: This is an abnormal EEG secondary to very low voltage general background slowing.  This finding may be seen with a diffuse disturbance that is etiologically nonspecific, but may include a medication effect or metabolic encephalopathy, among other possibilities.  No epileptiform activity was noted.      Alexis Goodell, MD Neurology [phone] 11/29/2018, 3:06 PM

## 2018-11-29 NOTE — Procedures (Signed)
Hemodialysis Catheter Insertion Procedure Note Kyle White [id number] 1960/07/25  Procedure: Insertion of Hemodialysis Catheter Indications: Dialysis Access   Procedure Details Consent: Risks of procedure as well as the alternatives and risks of each were explained to the (patient/caregiver).  Consent for procedure obtained. Time Out: Verified patient identification, verified procedure, site/side was marked, verified correct patient position, special equipment/implants available, medications/allergies/relevent history reviewed, required imaging and test results available.  Performed  Maximum sterile technique was used including antiseptics, cap, gloves, gown, hand hygiene, mask and sheet. Skin prep: Chlorhexidine; local anesthetic administered Triple lumen hemodialysis catheter was inserted into left internal jugular vein using the Seldinger technique.  Evaluation Blood flow good Complications: No apparent complications Patient did tolerate procedure well. Chest X-ray ordered to verify placement.  CXR: pending.  Left internal jugular temporary trialysis catheter placed utilizing ultrasound. Successfully punctured left internal jugular vessel, visualized guidewire placement in internal jugular utilizing ultrasound.  No complications noted during or following procedure.  Marda Stalker, [place] Pager [phone] (please enter 7 digits) PCCM Consult Pager [phone] (please enter 7 digits)

## 2018-11-29 NOTE — Progress Notes (Signed)
TREATMENT INITIATED CATHETER PLACEMENT CONFIRMED, 1ST HD TRE   11/29/18 1643  Vital Signs  Resp (!) 23  BP 94/74  BP Location Right Arm  BP Method Automatic  Patient Position (if appropriate) Lying  Oxygen Therapy  End Tidal CO2 (EtCO2) 32  During Hemodialysis Assessment  Blood Flow Rate (mL/min) 200 mL/min  Arterial Pressure (mmHg) -70 mmHg  Venous Pressure (mmHg) 130 mmHg  Transmembrane Pressure (mmHg) 20 mmHg  Ultrafiltration Rate (mL/min) 250 mL/min  Dialysate Flow Rate (mL/min) 300 ml/min  Conductivity: Machine  15.5  HD Safety Checks Performed Yes  Dialysis Fluid Bolus Normal Saline  Bolus Amount (mL) 250 mL  Intra-Hemodialysis Comments Tx initiated  ATMENT, NO S/S OF DISTRESS, REMAIN INTUBATED, NO FLUID REMOVAL ORDERED, 2K2.5CA BATH FOR 2 HOUR TREATMENT

## 2018-11-29 NOTE — Progress Notes (Signed)
TREATMENT COMPLETED TOLDERATED WELL NO S/S OF DISTRESS TO NOTE, NO FLUID REMOVAL PER ORDER   11/29/18 1845  Vital Signs  Temp 97.6 F (36.4 C)  Temp Source Axillary  Pulse Rate 99  Pulse Rate Source Monitor  Resp 15  BP 92/74  BP Location Right Arm  BP Method Automatic  Patient Position (if appropriate) Lying  Oxygen Therapy  SpO2 94 %  O2 Device Ventilator  End Tidal CO2 (EtCO2) 30  During Hemodialysis Assessment  Blood Flow Rate (mL/min) 150 mL/min  Arterial Pressure (mmHg) 10 mmHg  Venous Pressure (mmHg) 150 mmHg  Transmembrane Pressure (mmHg) 10 mmHg  Ultrafiltration Rate (mL/min) 0 mL/min  Dialysate Flow Rate (mL/min) 300 ml/min  Conductivity: Machine  13.4  HD Safety Checks Performed Yes  Dialysis Fluid Bolus Normal Saline  Bolus Amount (mL) 250 mL  Intra-Hemodialysis Comments Tolerated well;Tx completed  Post-Hemodialysis Assessment  Rinseback Volume (mL) 250 mL  KECN 25.4 V  Dialyzer Clearance Lightly streaked  Duration of HD Treatment -hour(s) 2 hour(s)  Hemodialysis Intake (mL) 500 mL  UF Total -Machine (mL) 500 mL  Net UF (mL) 0 mL  Tolerated HD Treatment Yes  Education / Care Plan  Dialysis Education Provided Yes  Documented Education in Care Plan Yes  Outpatient Plan of Care Reviewed and on Chart Yes

## 2018-11-29 NOTE — Consult Note (Signed)
Pharmacy Antibiotic Note  Kyle White is a 58 y.o. male admitted on 10/29/2018 with pneumonia. 9/3 BAL significant for Staph simulans and Step mitis/oralis. Pharmacy has been consulted for vancomycin dosing which covers both identified organisms per culture susceptibility data. Per ICU rounds, cefepime was discontinued 9/8 as patient is adequately covered on vancomycin monotherapy.    Today's labs reflected an increase in serum creatinine from 3.05 to 3.17. Prior to today patient's serum creatinine had been stable around ~2.2 (baseline 1.3).  Today is day 4 of appropriate antibiotic coverage for staph simulans & strep mitis/oralis PNA. CT w/o contrast 9/8 significant for bilateral ethmoid and sphenoid sinusitis. 9/9 CXR shows no new or focal airspace opacity. Per Dr. Patsey Berthold, wanted to add Pseudomonal and gram-negative coverage. Patient started meropenem for HCAP. WBC count is decreasing and patient is now afebrile. [name] starting intermittent HD.   Plan: Decrease meropenem to 500 mg Q24H (give post-HD)  Discontinue current vancomycin dose due to initiation of intermittent HD. Order random vanc level. Based on level, give up to 1.25g of vancomycin post-HD due to large BMI. Follow up with dialysis plan on 9/11.  Height: 5' 9.02" (175.3 cm) Weight: (!) 477 lb (216.4 kg) IBW/kg (Calculated) : 70.74  Temp (24hrs), Avg:100.1 F (37.8 C), Min:98.1 F (36.7 C), Max:100.9 F (38.3 C)  Recent Labs  Lab 11/25/18 0958 11/25/18 1250 11/26/18 0455 11/27/18 0411 11/27/18 1217 11/28/18 0151 11/29/18 0504  WBC 10.2  --  8.9 12.5*  --  13.3* 11.7*  CREATININE  --  2.26* 2.25* 2.21*  --  3.05* 3.17*  VANCORANDOM  --   --   --   --  13  --   --     Estimated Creatinine Clearance: 45.8 mL/min (A) (by C-G formula based on SCr of 3.17 mg/dL (H)).    No Known Allergies  Antimicrobials this admission: Cefepime 9/3 >> 9/8 Vancomycin 9/7 >>  Meropenem 9/9 >>  Dose adjustments this admission:   9/9: Vancomycin decreased from 1750 mg Q24H to 1250 mg Q24H due to serum creatinine bump  9/10: Meropenem decreased from 1g Q12H to 500 mg Q24H due to patient starting intermittent HD. Vancomycin transitioned to Dialysis dosing.   Microbiology results: 9/9 respiratory culture: rare gram positive cocci in pairs; culture reincubated for better growth 9/9 respiratory panel: none detected 9/7 Bcx: no growth x 3 days 9/3 BAL: Staph simulans & Strep mitis/oralis, vancomycin susceptible 8/29 Bcx: No growth 8/28 Ucx: No growth 8/28 MRSA PCR: negative  Thank you for allowing pharmacy to be a part of this patient's care.  Morning Halberg Dear Nicholes Mango, pharmacy student 11/29/2018 11:45 AM

## 2018-11-29 NOTE — Progress Notes (Signed)
Kyle White NAME: Kyle White    MR#:  [id number]  DATE OF BIRTH:  [birth year]  SUBJECTIVE:  Pt is febrile,remains intubated on the ventilator, severely hypoxemic ,unsuccessful weaning trials.  Critically ill, uremic underwent bronchoscopy 11/22/18.  Palliative care team is involved Sister Kyle White is following   REVIEW OF SYSTEMS:   Review of Systems  Unable to perform ROS: Intubated  Psychiatric/Behavioral: Nervous/anxious:      DRUG ALLERGIES:  No Known Allergies  VITALS:  Blood pressure 94/67, pulse 100, temperature 98.1 F (36.7 C), temperature source Oral, resp. rate 19, height 5' 9.02" (1.753 m), weight (!) 216.4 kg, SpO2 95 %.  PHYSICAL EXAMINATION:   Physical Exam  GENERAL:  58 y.o.-year-old patient lying in the bed with no acute distress. Morbidly obese critically ill intubated on the vent EYES: Pupils equal, round, reactive to light and accommodation. No scleral icterus. Extraocular muscles intact.  HEENT: Head atraumatic, normocephalic. Oropharynx and nasopharynx clear.  NECK:  Supple, no jugular venous distention. No thyroid enlargement, no tenderness.  LUNGS: Mod breath sounds bilaterally, positive wheezing, rales, rhonchi. No use of accessory muscles of respiration.  CARDIOVASCULAR: S1, S2 normal. No murmurs, rubs, or gallops.  ABDOMEN: Soft, nontender, nondistended. Bowel sounds present. No organomegaly or mass.  EXTREMITIES: No cyanosis, clubbing or edema b/l.    NEUROLOGIC: sedated GCS less than 8 PSYCHIATRIC: sedated  And on the vent SKIN: No obvious rash, lesion, or ulcer.   LABORATORY PANEL:  CBC Recent Labs  Lab 11/29/18 0504  WBC 11.7*  HGB 12.9*  HCT 41.2  PLT 57*    Chemistries  Recent Labs  Lab 11/26/18 0455  11/29/18 0504  NA 145   < > 147*  K 4.1   < > 4.3  CL 115*   < > 115*  CO2 19*   < > 22  GLUCOSE 420*   < > 154*  BUN 81*   < > 126*  CREATININE 2.25*   < > 3.17*   CALCIUM 8.2*   < > 7.7*  MG 2.6*  --   --    < > = values in this interval not displayed.   Cardiac Enzymes No results for input(s): TROPONINI in the last 168 hours. RADIOLOGY:  Ct Head Wo Contrast  Result Date: 11/27/2018 CLINICAL DATA:  Altered level of consciousness. EXAM: CT HEAD WITHOUT CONTRAST TECHNIQUE: Contiguous axial images were obtained from the base of the skull through the vertex without intravenous contrast. COMPARISON:  None. FINDINGS: Brain: No evidence of acute infarction, hemorrhage, hydrocephalus, extra-axial collection or mass lesion/mass effect. Vascular: No hyperdense vessel or unexpected calcification. Skull: Normal. Negative for fracture or focal lesion. Sinuses/Orbits: Fluid is noted in bilateral mastoid air cells. Bilateral ethmoid and sphenoid sinusitis is noted. Other: None. IMPRESSION: Bilateral ethmoid and sphenoid sinusitis. No acute intracranial abnormality seen. Electronically Signed   By: Kyle White M.D.   On: 11/27/2018 15:50   Dg Chest Port 1 View  Result Date: 11/28/2018 CLINICAL DATA:  Fever, intubation EXAM: PORTABLE CHEST 1 VIEW COMPARISON:  11/26/2018 FINDINGS: No significant interval change in AP portable chest radiograph with endotracheal tube over the mid trachea, esophagogastric tube, right neck vascular catheter, cardiomegaly, and layering small bilateral pleural effusions. No new or focal airspace opacity. IMPRESSION: No significant interval change in AP portable chest radiograph with cardiomegaly and layering small pleural effusions. No new or focal airspace opacity. Unchanged support apparatus. Electronically Signed  By: Kyle White M.D.   On: 11/28/2018 12:47   ASSESSMENT AND PLAN:  Kyle White  is a 58 y.o. male with a known history of atrial fibrillation on Coumadin, CHF, diabetes mellitus, and obstructive sleep apnea with CPAP use at home, morbid obesity with sedentary lifestyle on oxygen at 2 L/min at home.  1.Acute on chronic   respiratory failure with severe hypoxia-ARDS with pneumonia  febrile  -No clinical improvement, unsuccessful weaning trials - Extubated on 8/31 but required reintubation, vent management per PCCM team.  -patient underwent bronchoscopy 11/22/2018. 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 -vent support --- extremely poor prognosis with high mortality and morbidity -Continue tube feeds with pro-stat -Antibiotics have been changed to meropenem, vancomycin and IV steroids -Precedex and Norcuron as needed for agitation  -Septic not present on admission from pneumonia with ARDS Continue IV antibiotics Goal is to keep map greater than or equal to 65 IV steroids  #.Obstructive sleep apnea-- chronic  #Acute on chronic diastolic CHF, EF 55 to 123456 on echo in 2014 at [facility] -Repeat echocardiogram shows EF of 55 to 60%  # History of atrial fibrillation -Coumadin on hold  -Heparin drip drip discontinued in view of thrombocytopenia    #. Acute renal failure on chronic kidney disease stage III  with baseline creatinine 1.28-1.89-1.87-2.25-2.21-3.05--3.17 -Repeat BMP in the a.m. and continue to monitor renal function closely -Patient being uremic nephrology is considering dialysis  #  Type IIDiabetes mellitus -Sliding scale insulin -Hemoglobin A1c 7.3  # Generalized weakness -Physical therapy eval once extubated  #Adult failure to thrive Palliative care team is involved and talking to patient's sister Ms. Kyle White she is in denial, does not think patient is on life support and believes that patient is clinically improving.  Follow-up with palliative care   Overall poor prognosis. Appreciate palliative care input.  Discussed with intensivist Dr. Patsey White  CODE STATUS: DNR  DVT Prophylaxis: scd as patient is thrombocytopenic  TOTAL TIME TAKING CARE OF THIS PATIENT:33 minutes.  >50% time spent on counselling and coordination of care  POSSIBLE D/C IN *?* DAYS, DEPENDING ON  CLINICAL CONDITION.  Note: This dictation was prepared with Dragon dictation along with smaller phrase technology. Any transcriptional errors that result from this process are unintentional. No friends or family members at bedside  Kyle White M.D on 11/29/2018 at 2:50 PM  Between 7am to 6pm - Pager - [phone]  After 6pm go to [email or web address] - password [email or web address] [place] [facility]  Office  [phone]  CC: Primary care physician; Inc, [facility] ServicesPatient ID: Kyle White, male   DOB: [birth year], 58 y.o.   MRN: [id number]

## 2018-11-29 NOTE — Progress Notes (Signed)
Pharmacy Electrolyte Monitoring Consult:  Pharmacy consulted to assist in monitoring and replacing electrolytes in this 58 y.o. male admitted on 11/01/2018. [name] currently intubated and sedated on dexmedetomidine and hydromorphone infusion.   Labs:  Sodium (mmol/L)  Date Value  11/28/2018 146 (H)   Potassium (mmol/L)  Date Value  11/28/2018 3.9   Magnesium (mg/dL)  Date Value  11/26/2018 2.6 (H)   Phosphorus (mg/dL)  Date Value  11/26/2018 4.2   Calcium (mg/dL)  Date Value  11/28/2018 7.7 (L)   Albumin (g/dL)  Date Value  11/25/2018 2.6 (L)   Corrected Calcium: 8.8  Assessment/Plan: 1. Electrolytes: sodium slightly above normal limits and stable. Free water flushes at 200 mL q4h (increaed 9/8). Patient now on sodium bicarb drip. BMP with am labs.  09/10 @ 0000 K 3.9 will give one dose of KCl 40 mEq PO liquid x 1 to keep potassium > 4.0, will recheck w/ am labs.  2. Constipation: last documented bowel movement 8/28. Continue senna/docusate 2 tabs BID. Miralax VT Daily. Bisacodyl suppository x 1 on 9/8. No effect. Will order another bisacodyl suppository x1 today.   3. Glucose: Persistent hyperglycemia patient now on a insulin drip.. On methylprednisolone 40 mg BID since 8/31.   Pharmacy will continue to monitor and adjust per consult.   Tobie Lords, PharmD, BCPS Clinical Pharmacist 11/29/2018

## 2018-11-30 ENCOUNTER — Inpatient Hospital Stay: Payer: Medicare Other

## 2018-11-30 DIAGNOSIS — Z7189 Other specified counseling: Secondary | ICD-10-CM

## 2018-11-30 DIAGNOSIS — N179 Acute kidney failure, unspecified: Secondary | ICD-10-CM

## 2018-11-30 DIAGNOSIS — I482 Chronic atrial fibrillation, unspecified: Secondary | ICD-10-CM

## 2018-11-30 LAB — HEPARIN INDUCED PLATELET AB (HIT ANTIBODY): Heparin Induced Plt Ab: 0.074 OD (ref 0.000–0.400)

## 2018-11-30 LAB — CBC WITH DIFFERENTIAL/PLATELET
Abs Immature Granulocytes: 0.09 10*3/uL — ABNORMAL HIGH (ref 0.00–0.07)
Basophils Absolute: 0 10*3/uL (ref 0.0–0.1)
Basophils Relative: 0 %
Eosinophils Absolute: 0 10*3/uL (ref 0.0–0.5)
Eosinophils Relative: 0 %
HCT: 40.4 % (ref 39.0–52.0)
Hemoglobin: 13 g/dL (ref 13.0–17.0)
Immature Granulocytes: 1 %
Lymphocytes Relative: 2 %
Lymphs Abs: 0.2 10*3/uL — ABNORMAL LOW (ref 0.7–4.0)
MCH: 30.2 pg (ref 26.0–34.0)
MCHC: 32.2 g/dL (ref 30.0–36.0)
MCV: 93.7 fL (ref 80.0–100.0)
Monocytes Absolute: 0.3 10*3/uL (ref 0.1–1.0)
Monocytes Relative: 3 %
Neutro Abs: 8.3 10*3/uL — ABNORMAL HIGH (ref 1.7–7.7)
Neutrophils Relative %: 94 %
Platelets: 45 10*3/uL — ABNORMAL LOW (ref 150–400)
RBC: 4.31 MIL/uL (ref 4.22–5.81)
RDW: 13.2 % (ref 11.5–15.5)
WBC: 8.9 10*3/uL (ref 4.0–10.5)
nRBC: 0.5 % — ABNORMAL HIGH (ref 0.0–0.2)

## 2018-11-30 LAB — BASIC METABOLIC PANEL
Anion gap: 13 (ref 5–15)
BUN: 115 mg/dL — ABNORMAL HIGH (ref 6–20)
CO2: 23 mmol/L (ref 22–32)
Calcium: 7.7 mg/dL — ABNORMAL LOW (ref 8.9–10.3)
Chloride: 111 mmol/L (ref 98–111)
Creatinine, Ser: 3.11 mg/dL — ABNORMAL HIGH (ref 0.61–1.24)
GFR calc Af Amer: 24 mL/min — ABNORMAL LOW (ref >60)
GFR calc non Af Amer: 21 mL/min — ABNORMAL LOW (ref >60)
Glucose, Bld: 241 mg/dL — ABNORMAL HIGH (ref 70–99)
Potassium: 4.2 mmol/L (ref 3.5–5.1)
Sodium: 147 mmol/L — ABNORMAL HIGH (ref 135–145)

## 2018-11-30 LAB — HEPATITIS B SURFACE ANTIGEN: Hepatitis B Surface Ag: NEGATIVE

## 2018-11-30 LAB — GLUCOSE, CAPILLARY
Glucose-Capillary: 175 mg/dL — ABNORMAL HIGH (ref 70–99)
Glucose-Capillary: 210 mg/dL — ABNORMAL HIGH (ref 70–99)
Glucose-Capillary: 212 mg/dL — ABNORMAL HIGH (ref 70–99)
Glucose-Capillary: 227 mg/dL — ABNORMAL HIGH (ref 70–99)
Glucose-Capillary: 229 mg/dL — ABNORMAL HIGH (ref 70–99)
Glucose-Capillary: 243 mg/dL — ABNORMAL HIGH (ref 70–99)
Glucose-Capillary: 296 mg/dL — ABNORMAL HIGH (ref 70–99)

## 2018-11-30 LAB — HEPATITIS C ANTIBODY: HCV Ab: <0.1 s/co ratio (ref 0.0–0.9)

## 2018-11-30 LAB — CULTURE, RESPIRATORY W GRAM STAIN: Culture: NORMAL

## 2018-11-30 LAB — HEPATITIS B SURFACE ANTIBODY,QUALITATIVE: Hep B S Ab: NONREACTIVE

## 2018-11-30 LAB — MAGNESIUM: Magnesium: 2.8 mg/dL — ABNORMAL HIGH (ref 1.7–2.4)

## 2018-11-30 LAB — VANCOMYCIN, RANDOM: Vancomycin Rm: 22

## 2018-11-30 LAB — HEPATITIS B CORE ANTIBODY, IGM: Hep B C IgM: NEGATIVE

## 2018-11-30 LAB — PHOSPHORUS: Phosphorus: 7.1 mg/dL — ABNORMAL HIGH (ref 2.5–4.6)

## 2018-11-30 LAB — HEPATITIS B CORE ANTIBODY, TOTAL: Hep B Core Total Ab: NEGATIVE

## 2018-11-30 MED ORDER — DEXMEDETOMIDINE HCL IN NACL 400 MCG/100ML IV SOLN
0.0000 ug/kg/h | INTRAVENOUS | Status: DC
  Administered 2018-11-30 (×5): 1 ug/kg/h via INTRAVENOUS
  Administered 2018-12-01: 03:00:00 0.8 ug/kg/h via INTRAVENOUS
  Administered 2018-12-01: 1 ug/kg/h via INTRAVENOUS
  Administered 2018-12-01: 0.6 ug/kg/h via INTRAVENOUS
  Filled 2018-11-30 (×9): qty 100

## 2018-11-30 MED ORDER — ALTEPLASE 2 MG IJ SOLR
4.0000 mg | Freq: Once | INTRAMUSCULAR | Status: AC
  Administered 2018-11-30: 16:00:00 4 mg
  Filled 2018-11-30: qty 4

## 2018-11-30 MED ORDER — SODIUM CHLORIDE 0.9% FLUSH
3.0000 mL | Freq: Two times a day (BID) | INTRAVENOUS | Status: DC
  Administered 2018-11-30 – 2018-12-01 (×3): 3 mL via INTRAVENOUS

## 2018-11-30 MED ORDER — FENTANYL CITRATE (PF) 100 MCG/2ML IJ SOLN
50.0000 ug | INTRAMUSCULAR | Status: DC | PRN
  Administered 2018-12-01 (×2): 50 ug via INTRAVENOUS
  Filled 2018-11-30 (×2): qty 2

## 2018-11-30 MED ORDER — INSULIN ASPART 100 UNIT/ML ~~LOC~~ SOLN
0.0000 [IU] | SUBCUTANEOUS | Status: DC
  Administered 2018-11-30 (×3): 7 [IU] via SUBCUTANEOUS
  Administered 2018-11-30 – 2018-12-01 (×2): 11 [IU] via SUBCUTANEOUS
  Administered 2018-12-01 (×2): 7 [IU] via SUBCUTANEOUS
  Administered 2018-12-01: 11 [IU] via SUBCUTANEOUS
  Administered 2018-12-01: 14:00:00 7 [IU] via SUBCUTANEOUS
  Administered 2018-12-01: 11 [IU] via SUBCUTANEOUS
  Administered 2018-12-02 (×2): 7 [IU] via SUBCUTANEOUS
  Administered 2018-12-02 (×2): 11 [IU] via SUBCUTANEOUS
  Administered 2018-12-03: 4 [IU] via SUBCUTANEOUS
  Administered 2018-12-03: 04:00:00 15 [IU] via SUBCUTANEOUS
  Administered 2018-12-03: 4 [IU] via SUBCUTANEOUS
  Administered 2018-12-03: 7 [IU] via SUBCUTANEOUS
  Administered 2018-12-03: 20:00:00 4 [IU] via SUBCUTANEOUS
  Administered 2018-12-03 – 2018-12-04 (×3): 11 [IU] via SUBCUTANEOUS
  Administered 2018-12-04 (×2): 7 [IU] via SUBCUTANEOUS
  Administered 2018-12-04 (×2): 4 [IU] via SUBCUTANEOUS
  Administered 2018-12-05: 7 [IU] via SUBCUTANEOUS
  Administered 2018-12-05: 20:00:00 3 [IU] via SUBCUTANEOUS
  Administered 2018-12-05: 11 [IU] via SUBCUTANEOUS
  Administered 2018-12-05: 09:00:00 4 [IU] via SUBCUTANEOUS
  Administered 2018-12-05: 3 [IU] via SUBCUTANEOUS
  Administered 2018-12-05 – 2018-12-06 (×3): 4 [IU] via SUBCUTANEOUS
  Administered 2018-12-06: 3 [IU] via SUBCUTANEOUS
  Administered 2018-12-06 (×3): 4 [IU] via SUBCUTANEOUS
  Administered 2018-12-07: 08:00:00 3 [IU] via SUBCUTANEOUS
  Administered 2018-12-07 (×5): 4 [IU] via SUBCUTANEOUS
  Administered 2018-12-08: 7 [IU] via SUBCUTANEOUS
  Administered 2018-12-08 (×4): 4 [IU] via SUBCUTANEOUS
  Administered 2018-12-09 (×2): 7 [IU] via SUBCUTANEOUS
  Administered 2018-12-09 – 2018-12-10 (×5): 4 [IU] via SUBCUTANEOUS
  Administered 2018-12-10 (×2): 3 [IU] via SUBCUTANEOUS
  Administered 2018-12-10: 4 [IU] via SUBCUTANEOUS
  Administered 2018-12-10: 3 [IU] via SUBCUTANEOUS
  Administered 2018-12-10 (×2): 4 [IU] via SUBCUTANEOUS
  Administered 2018-12-11: 7 [IU] via SUBCUTANEOUS
  Administered 2018-12-11 (×3): 4 [IU] via SUBCUTANEOUS
  Administered 2018-12-11: 3 [IU] via SUBCUTANEOUS
  Administered 2018-12-12 (×3): 4 [IU] via SUBCUTANEOUS
  Filled 2018-11-30 (×69): qty 1

## 2018-11-30 MED ORDER — MIDAZOLAM HCL 2 MG/2ML IJ SOLN
2.0000 mg | INTRAMUSCULAR | Status: DC | PRN
  Administered 2018-12-01 (×2): 2 mg via INTRAVENOUS
  Filled 2018-11-30 (×7): qty 2

## 2018-11-30 MED ORDER — SODIUM CHLORIDE 0.9 % IV SOLN
2.0000 g | INTRAVENOUS | Status: DC
  Administered 2018-11-30 – 2018-12-01 (×2): 2 g via INTRAVENOUS
  Filled 2018-11-30: qty 20
  Filled 2018-11-30 (×2): qty 2

## 2018-11-30 NOTE — Progress Notes (Signed)
Unable to complete treatment due to clotting of system, at initiation of treatment poor performance of HD catheter noted, pt treatment stopped and activase added to both ports, with a dwell time of an hour, good blood return and flush noted, treatment was resumed within 30 minutes of treatment venous chamber of lines clotted and unable to return blood to patient, lines removed and replaced, blood return and flush continue to remain intact with catheter ports, treatment resumed again, noticed venous pressures increasing on hd machine, tried to flush to help prevent clotting to no avail 30 minutes into treatment again clot formed in venous chamber and unable to return blood. Dr. Juleen China informed and stated to not resume treatment and he will evaluate in the AM

## 2018-11-30 NOTE — Progress Notes (Signed)
Kyle White at [place] NAME: Kyle White    MR#:  [id number]  DATE OF BIRTH:  [birth year]  SUBJECTIVE:  Pt is febrile,remains intubated on the ventilator, severely hypoxemic with underlying pickwickian syndrome,unsuccessful weaning trials.  Critically ill, uremic, started hemodialysis  underwent bronchoscopy 11/22/18.  Palliative care team is involved Sister [name] is following   REVIEW OF SYSTEMS:   Review of Systems  Unable to perform ROS: Intubated  Psychiatric/Behavioral: Nervous/anxious:      DRUG ALLERGIES:   Allergies  Allergen Reactions  . Heparin Other (See Comments)    Patient being evaluated for HIT     VITALS:  Blood pressure 95/80, pulse (!) 126, temperature 98.5 F (36.9 C), temperature source Oral, resp. rate (!) 27, height 5' 9.02" (1.753 m), weight (!) 216.4 kg, SpO2 90 %.  PHYSICAL EXAMINATION:   Physical Exam  GENERAL:  58 y.o.-year-old patient lying in the bed with no acute distress. Morbidly obese critically ill intubated on the vent EYES: Pupils equal, round, reactive to light and accommodation. No scleral icterus. Extraocular muscles intact.  HEENT: Head atraumatic, normocephalic. Oropharynx and nasopharynx clear.  NECK:  Supple, no jugular venous distention. No thyroid enlargement, no tenderness.  LUNGS: Mod breath sounds bilaterally, positive wheezing, rales, rhonchi. No use of accessory muscles of respiration.  CARDIOVASCULAR: S1, S2 normal. No murmurs, rubs, or gallops.  ABDOMEN: Soft, nontender, nondistended. Bowel sounds present. No organomegaly or mass.  EXTREMITIES: No cyanosis, clubbing or edema b/l.    NEUROLOGIC: sedated GCS less than 8 PSYCHIATRIC: sedated  And on the vent SKIN: No obvious rash, lesion, or ulcer.   LABORATORY PANEL:  CBC Recent Labs  Lab 11/30/18 0632  WBC 8.9  HGB 13.0  HCT 40.4  PLT 45*    Chemistries  Recent Labs  Lab 11/30/18 0632  NA 147*  K 4.2  CL 111   CO2 23  GLUCOSE 241*  BUN 115*  CREATININE 3.11*  CALCIUM 7.7*  MG 2.8*   Cardiac Enzymes No results for input(s): TROPONINI in the last 168 hours. RADIOLOGY:  Dg Abd 1 View  Result Date: 11/30/2018 CLINICAL DATA:  OG tube placement. EXAM: ABDOMEN - 1 VIEW COMPARISON:  11/26/2018 FINDINGS: 1329 hours. Lower left chest and upper left abdomen have been included in the field of view. NG tube tip is identified in the mid stomach. Cardiopericardial silhouette appears enlarged with left base retrocardiac collapse/consolidation. IMPRESSION: OG tube tip is in the mid stomach. Electronically Signed   By: Misty Stanley M.D.   On: 11/30/2018 15:21   Dg Chest Port 1 View  Result Date: 11/30/2018 CLINICAL DATA:  Acute on chronic respiratory failure with severe hypoxia. ARDS with fever. Pneumonia. EXAM: PORTABLE CHEST 1 VIEW COMPARISON:  Single view of the chest 11/29/2018 and 11/28/2018. FINDINGS: Support tubes and lines are unchanged. Marked cardiomegaly is again seen. Left worse than right pleural effusions and airspace disease persist without change. IMPRESSION: No change in left worse than right pleural effusions and airspace disease. Marked cardiomegaly. No change in support apparatus. Electronically Signed   By: Inge Rise M.D.   On: 11/30/2018 08:37   Dg Chest Port 1 View  Result Date: 11/29/2018 CLINICAL DATA:  Central line placement. EXAM: PORTABLE CHEST 1 VIEW COMPARISON:  11/28/2018 FINDINGS: [name] rotated to the left. Endotracheal tube has tip 6.8 cm above the carina. Enteric tube courses into the stomach as tip is not visualized. Right IJ central venous catheter unchanged  with tip over the SVC. Interval placement of left IJ central venous catheter with tip obliquely oriented over the left brachiocephalic vein in the midline. Lungs are adequately inflated with mild stable hazy density over the left base likely small left effusion with associated atelectasis. Mild stable prominence of  the perihilar markings likely mild vascular congestion. Moderate stable cardiomegaly. Remainder of the exam is unchanged. IMPRESSION: Moderate stable cardiomegaly with mild vascular congestion. Stable hazy density over the left base likely small effusion with associated atelectasis. Tubes and lines as described. Interval placement of left IJ central venous catheter with tip in the midline over the brachiocephalic vein. Electronically Signed   By: Marin Olp M.D.   On: 11/29/2018 15:44   ASSESSMENT AND PLAN:  Kyle White  is a 58 y.o. male with a known history of atrial fibrillation on Coumadin, CHF, diabetes mellitus, and obstructive sleep apnea with CPAP use at home, morbid obesity with sedentary lifestyle on oxygen at 2 L/min at home.  1.Acute on chronic  respiratory failure with severe hypoxia-ARDS with pneumonia  underlying pickwickian syndrome -No clinical improvement, unsuccessful weaning trials - Extubated on 8/31 but required reintubation, vent management per PCCM team.  -patient underwent bronchoscopy 11/22/2018. 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 -vent support --- extremely poor prognosis with high mortality and morbidity -Continue tube feeds with pro-stat -Antibiotics have been changed from meropenem, vancomycin and IV steroids to Rocephin -versed   -Septic not present on admission from pneumonia with ARDS Continue IV antibiotics Goal is to keep map greater than or equal to 65 IV steroids  #.Obstructive sleep apnea-- chronic  #Acute on chronic diastolic CHF, EF 55 to 123456 on echo in 2014 at [facility] -Repeat echocardiogram shows EF of 55 to 60%  # History of atrial fibrillation -Coumadin on hold  -Heparin drip drip discontinued in view of thrombocytopenia  -HIT panel is pending   #. Acute renal failure on chronic kidney disease stage III  with baseline creatinine 1.28-1.89-1.87-2.25-2.21-3.05--3.17-3.11 -Repeat BMP in the a.m. and continue to monitor renal  function closely -Patient being uremic nephrology started the patient on hemodialysis  #  Type IIDiabetes mellitus -Sliding scale insulin -Hemoglobin A1c 7.3  # Generalized weakness -Physical therapy eval once extubated  #Adult failure to thrive Palliative care team is involved and talking to patient's sister Ms. [name] she is in denial, does not think patient is on life support and believes that patient is clinically improving.  Follow-up with palliative care   Overall poor prognosis. Appreciate palliative care input.  Discussed with intensivist Dr. Patsey Berthold  CODE STATUS: DNR  DVT Prophylaxis: scd as patient is thrombocytopenic  TOTAL TIME TAKING CARE OF THIS PATIENT:33 minutes.  >50% time spent on counselling and coordination of care  POSSIBLE D/C IN *?* DAYS, DEPENDING ON CLINICAL CONDITION.  Note: This dictation was prepared with Dragon dictation along with smaller phrase technology. Any transcriptional errors that result from this process are unintentional. No friends or family members at bedside  Nicholes Mango M.D on 11/30/2018 at 4:31 PM  Between 7am to 6pm - Pager - [phone]  After 6pm go to [email or web address] - password [email or web address] [place] [facility]  Office  [phone]  CC: Primary care physician; Inc, [place] ServicesPatient ID: Kyle White, male   DOB: 17-Apr-1960, 58 y.o.   MRN: [id number]

## 2018-11-30 NOTE — Plan of Care (Signed)
  Problem: Clinical Measurements: Goal: Ability to maintain clinical measurements within normal limits will improve Outcome: Progressing Goal: Will remain free from infection Outcome: Progressing Goal: Diagnostic test results will improve Outcome: Progressing Goal: Cardiovascular complication will be avoided Outcome: Progressing   Problem: Coping: Goal: Level of anxiety will decrease Outcome: Progressing   Problem: Pain Managment: Goal: General experience of comfort will improve Outcome: Progressing

## 2018-11-30 NOTE — Progress Notes (Signed)
[facility]  ROUNDING NOTE   Subjective:   First hemodialysis treatment yesterday. No ultrafiltration.   Objective:  Vital signs in last 24 hours:  Temp:  [97.6 F (36.4 C)-99.1 F (37.3 C)] 98.2 F (36.8 C) (09/11 0810) Pulse Rate:  [41-124] 41 (09/10 2130) Resp:  [15-25] 20 (09/11 0900) BP: (87-140)/(65-125) 103/85 (09/11 0900) SpO2:  [84 %-98 %] 97 % (09/11 0747) FiO2 (%):  [60 %-70 %] 60 % (09/11 0747)  Weight change:  Filed Weights   11/26/18 0312 11/27/18 0456 11/28/18 0451  Weight: (!) 210.9 kg (!) 213.6 kg (!) 216.4 kg    Intake/Output: I/O last 3 completed shifts: In: 6444.2 [I.V.:3182.5; NG/GT:3161.7; IV Piggyback:100] Out: 1260 [Urine:1260]   Intake/Output this shift:  No intake/output data recorded.  Physical Exam: General: Critically ill   Head: ETT  Eyes: Closed  Neck: Obese  Lungs:  Diminished bilaterally. Pressure Control FiO2 60%  Heart: irregular  Abdomen:  obese  Extremities: + peripheral edema.  Neurologic: Intubated, sedated  Skin: No lesions  Access:  Left IJ temp HD catheter 9/10 ICU    Basic Metabolic Panel: Recent Labs  Lab 11/24/18 0410 11/25/18 1250 11/26/18 0455 11/27/18 0411 11/28/18 0151 11/28/18 1606 11/28/18 2311 11/29/18 0504 11/30/18 0632  NA 145 145 145 147* 146*  --   --  147* 147*  K 4.6 4.1 4.1 4.0 4.6 4.1 3.9 4.3 4.2  CL 105 112* 115* 118* 116*  --   --  115* 111  CO2 28 20* 19* 17* 19*  --   --  22 23  GLUCOSE 276* 377* 420* 405* 405*  --   --  154* 241*  BUN 77* 79* 81* 86* 98*  --   --  126* 115*  CREATININE 1.87* 2.26* 2.25* 2.21* 3.05*  --   --  3.17* 3.11*  CALCIUM 8.4* 8.5* 8.2* 8.0* 7.7*  --   --  7.7* 7.7*  MG 2.7*  --  2.6*  --   --   --   --   --  2.8*  PHOS 3.3 3.5 4.2  --   --   --   --   --  7.1*    Liver Function Tests: Recent Labs  Lab 11/25/18 1250  ALBUMIN 2.6*   No results for input(s): LIPASE, AMYLASE in the last 168 hours. No results for input(s): AMMONIA in the last  168 hours.  CBC: Recent Labs  Lab 11/24/18 0410  11/26/18 0455 11/27/18 0411 11/28/18 0151 11/29/18 0504 11/30/18 0632  WBC 10.3   < > 8.9 12.5* 13.3* 11.7* 8.9  NEUTROABS 8.8*  --   --   --   --   --  8.3*  HGB 14.9   < > 13.8 13.7 12.5* 12.9* 13.0  HCT 47.2   < > 43.8 43.2 41.5 41.2 40.4  MCV 96.1   < > 94.4 94.1 99.8 95.6 93.7  PLT 139*   < > 107* 89* 72* 57* 45*   < > = values in this interval not displayed.    Cardiac Enzymes: No results for input(s): CKTOTAL, CKMB, CKMBINDEX, TROPONINI in the last 168 hours.  BNP: Invalid input(s): POCBNP  CBG: Recent Labs  Lab 11/29/18 1838 11/29/18 1948 11/30/18 0008 11/30/18 0314 11/30/18 0721  GLUCAP 136* 161* 212* 210* 175*    Microbiology: Results for orders placed or performed during the hospital encounter of 10/23/2018  SARS CORONAVIRUS 2 (TAT 6-12 HRS) Nasal Swab Aptima Multi Swab  Status: None   Collection Time: 10/21/2018  4:55 PM   Specimen: Aptima Multi Swab; Nasal Swab  Result Value Ref Range Status   SARS Coronavirus 2 NEGATIVE NEGATIVE Final    Comment: (NOTE) SARS-CoV-2 target nucleic acids are NOT DETECTED. The SARS-CoV-2 RNA is generally detectable in upper and lower respiratory specimens during the acute phase of infection. Negative results do not preclude SARS-CoV-2 infection, do not rule out co-infections with other pathogens, and should not be used as the sole basis for treatment or other patient management decisions. Negative results must be combined with clinical observations, patient history, and epidemiological information. The expected result is Negative. Fact Sheet for Patients: SugarRoll.be Fact Sheet for Healthcare Providers: https://www.woods-mathews.com/ This test is not yet approved or cleared by the [place] FDA and  has been authorized for detection and/or diagnosis of SARS-CoV-2 by FDA under an Emergency Use Authorization (EUA). This EUA  will remain  in effect (meaning this test can be used) for the duration of the COVID-19 declaration under Section 56 4(b)(1) of the Act, 21 U.S.C. section 360bbb-3(b)(1), unless the authorization is terminated or revoked sooner. Performed at [facility], [place] [address] [place], [place] [address]   SARS Coronavirus 2 [facility] order, Performed in [facility] hospital lab) Nasopharyngeal Nasopharyngeal Swab     Status: None   Collection Time: 10/21/2018 10:34 PM   Specimen: Nasopharyngeal Swab  Result Value Ref Range Status   SARS Coronavirus 2 NEGATIVE NEGATIVE Final    Comment: (NOTE) If result is NEGATIVE SARS-CoV-2 target nucleic acids are NOT DETECTED. The SARS-CoV-2 RNA is generally detectable in upper and lower  respiratory specimens during the acute phase of infection. The lowest  concentration of SARS-CoV-2 viral copies this assay can detect is 250  copies / mL. A negative result does not preclude SARS-CoV-2 infection  and should not be used as the sole basis for treatment or other  patient management decisions.  A negative result may occur with  improper specimen collection / handling, submission of specimen other  than nasopharyngeal swab, presence of viral mutation(s) within the  areas targeted by this assay, and inadequate number of viral copies  (<250 copies / mL). A negative result must be combined with clinical  observations, patient history, and epidemiological information. If result is POSITIVE SARS-CoV-2 target nucleic acids are DETECTED. The SARS-CoV-2 RNA is generally detectable in upper and lower  respiratory specimens dur ing the acute phase of infection.  Positive  results are indicative of active infection with SARS-CoV-2.  Clinical  correlation with patient history and other diagnostic information is  necessary to determine patient infection status.  Positive results do  not rule out bacterial infection or co-infection with other viruses. If result  is PRESUMPTIVE POSTIVE SARS-CoV-2 nucleic acids MAY BE PRESENT.   A presumptive positive result was obtained on the submitted specimen  and confirmed on repeat testing.  While 2019 novel coronavirus  (SARS-CoV-2) nucleic acids may be present in the submitted sample  additional confirmatory testing may be necessary for epidemiological  and / or clinical management purposes  to differentiate between  SARS-CoV-2 and other Sarbecovirus currently known to infect humans.  If clinically indicated additional testing with an alternate test  methodology [phone]) is advised. The SARS-CoV-2 RNA is generally  detectable in upper and lower respiratory sp ecimens during the acute  phase of infection. The expected result is Negative. Fact Sheet for Patients:  StrictlyIdeas.no Fact Sheet for Healthcare Providers: BankingDealers.co.za This test is  not yet approved or cleared by the [place] and has been authorized for detection and/or diagnosis of SARS-CoV-2 by FDA under an Emergency Use Authorization (EUA).  This EUA will remain in effect (meaning this test can be used) for the duration of the COVID-19 declaration under Section 564(b)(1) of the Act, 21 U.S.C. section 360bbb-3(b)(1), unless the authorization is terminated or revoked sooner. Performed at [facility], [address] [place], [place] [address]   Culture, respiratory (non-expectorated)     Status: None   Collection Time: 11/10/2018 10:34 PM   Specimen: Tracheal Aspirate; Respiratory  Result Value Ref Range Status   Specimen Description   Final    TRACHEAL ASPIRATE Performed at [facility], [address] [place], [place] [address]    Special Requests   Final    NONE Performed at [facility], [address] [place], [place] [address]    Gram Stain   Final    RARE WBC PRESENT, PREDOMINANTLY PMN ABUNDANT GRAM POSITIVE COCCI IN CHAINS     Culture   Final    Consistent with normal respiratory flora. Performed at [facility], [place] [address] [place], [place] [address]    Report Status 11/19/2018 FINAL  Final  Urine Culture     Status: None   Collection Time: 10/23/2018 11:12 PM   Specimen: Urine, Random  Result Value Ref Range Status   Specimen Description   Final    URINE, RANDOM Performed at [facility], [address], [place], [place] [address]    Special Requests   Final    NONE Performed at [facility], [address] [place], [place] [address]    Culture   Final    NO GROWTH Performed at [facility], [place] [address] [place], [place] [address]    Report Status 11/18/2018 FINAL  Final  MRSA PCR Screening     Status: None   Collection Time: 11/19/2018 11:28 PM   Specimen: Nasopharyngeal  Result Value Ref Range Status   MRSA by PCR NEGATIVE NEGATIVE Final    Comment:        The GeneXpert MRSA Assay (FDA approved for NASAL specimens only), is one component of a comprehensive MRSA colonization surveillance program. It is not intended to diagnose MRSA infection nor to guide or monitor treatment for MRSA infections. Performed at [facility], [address] [place], [place] [address]   Culture, blood (Routine X 2) w Reflex to ID Panel     Status: None   Collection Time: 11/17/18 12:19 AM   Specimen: BLOOD  Result Value Ref Range Status   Specimen Description BLOOD RIGHT ANTECUBITAL  Final   Special Requests   Final    BOTTLES DRAWN AEROBIC AND ANAEROBIC Blood Culture adequate volume   Culture   Final    NO GROWTH 5 DAYS Performed at [facility], [address] [place], [place] [address]    Report Status 11/22/2018 FINAL  Final  Culture, blood (Routine X 2) w Reflex to ID Panel     Status: None   Collection Time: 11/17/18 12:31 AM   Specimen: BLOOD  Result Value Ref Range Status   Specimen Description BLOOD CENTRAL LINE MIDLINE  Final   Special  Requests   Final    BOTTLES DRAWN AEROBIC AND ANAEROBIC Blood Culture adequate volume   Culture   Final    NO GROWTH 5 DAYS Performed at [facility], [address], [place], [place] [address]    Report Status 11/22/2018 FINAL  Final  Culture, bal-quantitative     Status: Abnormal   Collection Time: 11/22/18  7:48 AM   Specimen: Bronchoalveolar Lavage; Respiratory  Result Value Ref Range Status   Specimen Description   Final    BRONCHIAL ALVEOLAR LAVAGE Performed at [facility], [address] [place], [place] [address]    Special Requests   Final    Immunocompromised Performed at [facility], [place], [place] [address]    Gram Stain   Final    FEW WBC PRESENT, PREDOMINANTLY PMN FEW GRAM POSITIVE COCCI IN PAIRS IN CLUSTERS FEW GRAM POSITIVE RODS Performed at [facility], [place] [address], [place], [place] [address]    Culture (A)  Final    >=100,000 COLONIES/mL STAPHYLOCOCCUS SIMULANS >=100,000 COLONIES/mL STREPTOCOCCUS MITIS/ORALIS    Report Status 11/25/2018 FINAL  Final   Organism ID, Bacteria STAPHYLOCOCCUS SIMULANS (A)  Final   Organism ID, Bacteria STREPTOCOCCUS MITIS/ORALIS (A)  Final      Susceptibility   Staphylococcus simulans - MIC*    CIPROFLOXACIN <=0.5 SENSITIVE Sensitive     ERYTHROMYCIN <=0.25 SENSITIVE Sensitive     GENTAMICIN <=0.5 SENSITIVE Sensitive     OXACILLIN <=0.25 SENSITIVE Sensitive     TETRACYCLINE >=16 RESISTANT Resistant     VANCOMYCIN <=0.5 SENSITIVE Sensitive     TRIMETH/SULFA <=10 SENSITIVE Sensitive     CLINDAMYCIN <=0.25 SENSITIVE Sensitive     RIFAMPIN <=0.5 SENSITIVE Sensitive     Inducible Clindamycin NEGATIVE Sensitive     * >=100,000 COLONIES/mL STAPHYLOCOCCUS SIMULANS   Streptococcus mitis/oralis - MIC*    TETRACYCLINE >=16 RESISTANT Resistant     VANCOMYCIN 0.25 SENSITIVE Sensitive     CLINDAMYCIN 0.5 INTERMEDIATE Intermediate     * >=100,000 COLONIES/mL STREPTOCOCCUS  MITIS/ORALIS  CULTURE, BLOOD (ROUTINE X 2) w Reflex to ID Panel     Status: None (Preliminary result)   Collection Time: 11/26/18 10:32 PM   Specimen: BLOOD  Result Value Ref Range Status   Specimen Description BLOOD RIGHT ANTECUBITAL  Final   Special Requests   Final    BOTTLES DRAWN AEROBIC AND ANAEROBIC Blood Culture adequate volume   Culture   Final    NO GROWTH 4 DAYS Performed at [facility], [address] [place], [place] [address]    Report Status PENDING  Incomplete  CULTURE, BLOOD (ROUTINE X 2) w Reflex to ID Panel     Status: None (Preliminary result)   Collection Time: 11/26/18 10:32 PM   Specimen: BLOOD  Result Value Ref Range Status   Specimen Description BLOOD BLOOD RIGHT HAND  Final   Special Requests   Final    BOTTLES DRAWN AEROBIC AND ANAEROBIC Blood Culture adequate volume   Culture   Final    NO GROWTH 4 DAYS Performed at [facility], [address] [place], [place] [address]    Report Status PENDING  Incomplete  Respiratory Panel by PCR     Status: None   Collection Time: 11/28/18 10:36 AM   Specimen: Nasopharyngeal Swab; Respiratory  Result Value Ref Range Status   Adenovirus NOT DETECTED NOT DETECTED Final   Coronavirus 229E NOT DETECTED NOT DETECTED Final    Comment: (NOTE) The Coronavirus on the Respiratory Panel, DOES NOT test for the novel  Coronavirus (2019 nCoV)    Coronavirus HKU1 NOT DETECTED NOT DETECTED Final   Coronavirus NL63 NOT DETECTED NOT  DETECTED Final   Coronavirus OC43 NOT DETECTED NOT DETECTED Final   Metapneumovirus NOT DETECTED NOT DETECTED Final   Rhinovirus / Enterovirus NOT DETECTED NOT DETECTED Final   Influenza A NOT DETECTED NOT DETECTED Final   Influenza B NOT DETECTED NOT DETECTED Final   Parainfluenza Virus 1 NOT DETECTED NOT DETECTED Final   Parainfluenza Virus 2 NOT DETECTED NOT DETECTED Final   Parainfluenza Virus 3 NOT DETECTED NOT DETECTED Final   Parainfluenza Virus 4 NOT DETECTED NOT  DETECTED Final   Respiratory Syncytial Virus NOT DETECTED NOT DETECTED Final   Bordetella pertussis NOT DETECTED NOT DETECTED Final   Chlamydophila pneumoniae NOT DETECTED NOT DETECTED Final   Mycoplasma pneumoniae NOT DETECTED NOT DETECTED Final    Comment: Performed at [facility], [place] [address], [place], [place] [address]  Culture, respiratory (non-expectorated)     Status: None (Preliminary result)   Collection Time: 11/28/18 11:29 AM   Specimen: Tracheal Aspirate; Respiratory  Result Value Ref Range Status   Specimen Description   Final    TRACHEAL ASPIRATE Performed at [facility], [address] [place], [place] [address]    Special Requests   Final    NONE Performed at [facility], [address] [place], [place] [address]    Gram Stain   Final    FEW WBC PRESENT, PREDOMINANTLY PMN FEW SQUAMOUS EPITHELIAL CELLS PRESENT RARE GRAM POSITIVE COCCI IN PAIRS    Culture   Final    CULTURE REINCUBATED FOR BETTER GROWTH Performed at [facility], [place] [address], [place], [place] [address]    Report Status PENDING  Incomplete    Coagulation Studies: No results for input(s): LABPROT, INR in the last 72 hours.  Urinalysis: No results for input(s): COLORURINE, LABSPEC, PHURINE, GLUCOSEU, HGBUR, BILIRUBINUR, KETONESUR, PROTEINUR, UROBILINOGEN, NITRITE, LEUKOCYTESUR in the last 72 hours.  Invalid input(s): APPERANCEUR    Imaging: Dg Chest Port 1 View  Result Date: 11/30/2018 CLINICAL DATA:  Acute on chronic respiratory failure with severe hypoxia. ARDS with fever. Pneumonia. EXAM: PORTABLE CHEST 1 VIEW COMPARISON:  Single view of the chest 11/29/2018 and 11/28/2018. FINDINGS: Support tubes and lines are unchanged. Marked cardiomegaly is again seen. Left worse than right pleural effusions and airspace disease persist without change. IMPRESSION: No change in left worse than right pleural effusions and airspace disease. Marked cardiomegaly. No  change in support apparatus. Electronically Signed   By: Inge Rise M.D.   On: 11/30/2018 08:37   Dg Chest Port 1 View  Result Date: 11/29/2018 CLINICAL DATA:  Central line placement. EXAM: PORTABLE CHEST 1 VIEW COMPARISON:  11/28/2018 FINDINGS: [name] rotated to the left. Endotracheal tube has tip 6.8 cm above the carina. Enteric tube courses into the stomach as tip is not visualized. Right IJ central venous catheter unchanged with tip over the SVC. Interval placement of left IJ central venous catheter with tip obliquely oriented over the left brachiocephalic vein in the midline. Lungs are adequately inflated with mild stable hazy density over the left base likely small left effusion with associated atelectasis. Mild stable prominence of the perihilar markings likely mild vascular congestion. Moderate stable cardiomegaly. Remainder of the exam is unchanged. IMPRESSION: Moderate stable cardiomegaly with mild vascular congestion. Stable hazy density over the left base likely small effusion with associated atelectasis. Tubes and lines as described. Interval placement of left IJ central venous catheter with tip in the midline over the brachiocephalic vein. Electronically Signed   By: Quillian Quince  Derrel Nip M.D.   On: 11/29/2018 15:44   Dg Chest Port 1 View  Result Date: 11/28/2018 CLINICAL DATA:  Fever, intubation EXAM: PORTABLE CHEST 1 VIEW COMPARISON:  11/26/2018 FINDINGS: No significant interval change in AP portable chest radiograph with endotracheal tube over the mid trachea, esophagogastric tube, right neck vascular catheter, cardiomegaly, and layering small bilateral pleural effusions. No new or focal airspace opacity. IMPRESSION: No significant interval change in AP portable chest radiograph with cardiomegaly and layering small pleural effusions. No new or focal airspace opacity. Unchanged support apparatus. Electronically Signed   By: Eddie Candle M.D.   On: 11/28/2018 12:47     Medications:   .  sodium chloride    . cefTRIAXone (ROCEPHIN)  IV 2 g (11/30/18 1031)  . dexmedetomidine (PRECEDEX) IV infusion Stopped (11/30/18 1051)  . feeding supplement (VITAL HIGH PROTEIN) 45 mL/hr at 11/30/18 0332   . aspirin  81 mg Per NG tube Daily  . chlorhexidine gluconate (MEDLINE KIT)  15 mL Mouth Rinse BID  . Chlorhexidine Gluconate Cloth  6 each Topical Daily  . feeding supplement (PRO-STAT SUGAR FREE 64)  60 mL Per Tube TID  . free water  200 mL Per Tube Q4H  . insulin aspart  0-20 Units Subcutaneous Q4H  . ipratropium-albuterol  3 mL Nebulization Q6H  . mouth rinse  15 mL Mouth Rinse 10 times per day  . metoprolol tartrate  50 mg Per NG tube BID  . multivitamin  15 mL Per Tube Daily  . pantoprazole sodium  40 mg Per Tube QHS  . polyethylene glycol  17 g Per Tube BID  . senna-docusate  2 tablet Per Tube BID  . sodium chloride flush  10-40 mL Intracatheter Q12H   sodium chloride, acetaminophen, bisacodyl, fentaNYL (SUBLIMAZE) injection, ibuprofen, metoprolol tartrate, midazolam, ondansetron (ZOFRAN) IV, sodium chloride flush  Assessment/ Plan:  Mr. Kyle White is a 59 y.o. white male with atrial fibrillation on warfarin, congestive heart failure, diabetes mellitus type 2, obstructive sleep apnea, morbid obesity, gout, mitral valve replacement who was admitted to [facility] on 10/29/2018 for acute exacerbation of COPD.  1.  Acute renal failure with metabolic acidosis on chronic kidney disease stage III: creatinine on admission of 1.31, GFR of 53.   Baseline creatinine appears to be 1.3 with an EGFR 57.  Urinalysis with proteinuria. Acute renal failure secondary to acute cardiorenal syndrome.  Nonoliguric urine output.  - With uremia and concern for uremic encephalopathy. Second hemodialysis treatment scheduled for today.   2.  Acute respiratory failure: requiring mechanical ventilation. With pneumonia. Pickwickian syndrome - meropenem.  - Appreciate critical care input  3. Hypotension: off  vasopressors. Restarted metoprolol.   4. Diastolic congestive heart failure: acute exacerbation.  - Attempt ultrafiltration with today's HD treatment.    LOS: [place] 9/11/202010:51 AM

## 2018-11-30 NOTE — Progress Notes (Signed)
PULMONARY/CCM PROGRESS NOTE  PT PROFILE: 58 y.o. M with hx of type 2 diabetes, chronic AF, pickwickian syndrome, hypertension, congestive heart failure adm 8/28 via ED to ICU/SDU with acute on chronic respiratory failure felt to be due to CHF/pulmonary edema  MAJOR EVENTS/TEST RESULTS: 08/28 admitted to ICU/SDU for BiPAP. PCCM consultation 08/28 emergently intubated 08/29 Echocardiogram: EF 55-60%.  LV cavity mildly dilated.  Diastolic function could not be evaluated.  Left atrium mildly dilated.  RV not assessed.  RA size not well visualized. 08/29 CTAP: No acute intra-abdominal process noted.  No perforation.  Trace ascites.  Severe hepatic steatosis.  Cholelithiasis.  Small R and trace L pleural effusions.  Bilateral lower lobe airspace disease may represent atelectasis but pneumonia is difficult to exclude, particularly on the R. 08/30 Extubated 08/30 Cardiology consultation: recommended diltiazem gtt for rapid AF and continue anticoagulation 08/31 reintubated emergently 08/31 bronchoscopy for mucous plugging and complete collapse of R lung 08/31 Palliative Care consultation to address goals of care 09/03 DNR established 09/05 Nephrology consultation for AKI 09/06 Renal US: Normal appearance of the kidneys 09/06 requiring 100% FiO2 and PEEP 15 cm H2O 09/07 ETT cuff leak, ETT changed 09/08 CTH: No acute intracranial abnormality seen 09/10 HD cath placed and intermittent HD initiated   INDWELLING DEVICES:: ETT 08/28 >> 08/30, 08/31 >> 09/07 (cuff leak), 09/07 >>  R IJ CVL 08/31 >>  L IJ HD cath 09/10 >>   MICRO DATA: SARS CoV 2 PCR 08/28 >> NEG MRSA PCR 08/28 >> NEG Urine 08/28 >> NEG Resp  08/28 >> abundant GPC chains Blood 08/29 >> NEG BAL 09/03 >> Staph simulans, strep mitis Blood 09/07 >> NEG RVP 09/09 >> NEG Resp 09/09 >> c/w NOF  ANTIMICROBIALS:  Cefepime 09/03 >> 09/08  Vancomycin 09/07 >> 09/10 Meropenem 09/09 >> 09/11 Ceftriaxone 09/11 >>    SUBJ: Initially  sedated and not F/C. After dexmedetomidine DC'd, pt able to stick tongue out to command. Synchronous with vent. RASS -2  OBJ: Vitals:   11/30/18 1223 11/30/18 1300 11/30/18 1311 11/30/18 1400  BP: (!) 82/68 (!) 107/95  90/79  Pulse:  (!) 105  (!) 107  Resp:  (!) 26  (!) 27  Temp:      TempSrc:      SpO2:  100% 97% (!) 87%  Weight:      Height:       Vent Mode: PCV FiO2 (%):  [50 %-70 %] 60 % Set Rate:  [15 bmp] 15 bmp PEEP:  [12 cmH20-15 cmH20] 12 cmH20 Plateau Pressure:  [22 cmH20-26 cmH20] 22 cmH20   Gen: Obese, intubated, sedated, synchronous with vent, RASS -2, + F/C HEENT: heavily bearded, NCAT, sclerae white Neck: No LAN, no JVD noted Lungs: no wheezes anteriorly Cardiovascular: IRIR, rate controlled, no M noted Abdomen: Severe cetripetal obesity, abd soft, NT, +BS Ext: warm, no edema Neuro: PERRL, EOMI, moves all extremities Skin: No lesions noted   BMP Latest Ref Rng & Units 11/30/2018 11/29/2018 11/28/2018  Glucose 70 - 99 mg/dL 241(H) 154(H) -  BUN 6 - 20 mg/dL 115(H) 126(H) -  Creatinine 0.61 - 1.24 mg/dL 3.11(H) 3.17(H) -  Sodium 135 - 145 mmol/L 147(H) 147(H) -  Potassium 3.5 - 5.1 mmol/L 4.2 4.3 3.9  Chloride 98 - 111 mmol/L 111 115(H) -  CO2 22 - 32 mmol/L 23 22 -  Calcium 8.9 - 10.3 mg/dL 7.7(L) 7.7(L) -    Hepatic Function Latest Ref Rng & Units 11/25/2018 11/17/2018 10/20/2018  Total  Protein 6.5 - 8.1 g/dL - 6.6 6.9  Albumin 3.5 - 5.0 g/dL 2.6(L) 3.5 3.6  AST 15 - 41 U/L - 20 23  ALT 0 - 44 U/L - 25 29  Alk Phosphatase 38 - 126 U/L - 64 67  Total Bilirubin 0.3 - 1.2 mg/dL - 1.0 0.5    CBC Latest Ref Rng & Units 11/30/2018 11/29/2018 11/28/2018  WBC 4.0 - 10.5 K/uL 8.9 11.7(H) 13.3(H)  Hemoglobin 13.0 - 17.0 g/dL 13.0 12.9(L) 12.5(L)  Hematocrit 39.0 - 52.0 % 40.4 41.2 41.5  Platelets 150 - 400 K/uL 45(L) 57(L) 72(L)    ABG    Component Value Date/Time   PHART 7.34 (L) 11/28/2018 0911   PCO2ART 33 11/28/2018 0911   PO2ART 67 (L) 11/28/2018 0911    HCO3 17.8 (L) 11/28/2018 0911   ACIDBASEDEF 7.0 (H) 11/28/2018 0911   O2SAT 91.7 11/28/2018 0911    CXR: Cardiomegaly, bilateral LL opacities, suspect effusions, L greater than R   IMPRESSION: Prolonged VDRF Congestive heart failure Bilateral pleural effusions Bacterial pneumonia (staph and strep species on BAL) Pickwickian syndrome Chronic atrial fibrillation, rate controlled AKI, nonoliguric Hypernatremia Morbid obesity Type 2 diabetes with hyperglycemia Thrombocytopenia.  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  Improving ICU/ventilator associated discomfort  PLAN/REC: Cont full vent support - settings reviewed and/or adjusted Cont vent bundle Daily SBT if/when meets criteria Continue scheduled nebulized bronchodilators ICU HD monitoring DC amiodarone for now and monitor rhythm Cont scheduled enteral metoprolol Cont IV metoprolol to maintain HR < 115/min Monitor BMET intermittently Monitor I/Os Correct electrolytes as indicated HD per nephrology today Continue free water repletion Continue SSI protocol, resistant scale Continue TF protocol Monitor temp, WBC count Micro and abx as above DVT px: SCDs Monitor CBC intermittently Transfuse per usual guidelines Cont PAD protocol - dex gtt, PRN fentanyl, PRN midaz. RASS goal -1, -2   I communicated with pt's sister 09/10 and refined goals of care.  I indicated that he had demonstrated some improvement and was much better stabilized.  I explained that none of his current conditions in and of themselves could be considered "irreversible".  However, sometimes the summation of multiple reversible problems becomes an irreversible problem.  We discussed the likely need for tracheostomy tube if support is to be continued well into next week.  I explained that a tracheostomy tube would also fix the problem of obstructive sleep apnea.  She is to consider goals of care based on our discussion.  CCM time: 60  mins The above time includes time spent in consultation with patient and/or family members and reviewing care plan on multidisciplinary rounds  Merton Border, MD PCCM service Mobile [phone] Pager [phone] 11/30/2018 2:11 PM

## 2018-12-01 ENCOUNTER — Inpatient Hospital Stay: Payer: Medicare Other

## 2018-12-01 DIAGNOSIS — J159 Unspecified bacterial pneumonia: Secondary | ICD-10-CM

## 2018-12-01 DIAGNOSIS — J80 Acute respiratory distress syndrome: Secondary | ICD-10-CM

## 2018-12-01 DIAGNOSIS — J810 Acute pulmonary edema: Secondary | ICD-10-CM

## 2018-12-01 LAB — CULTURE, BLOOD (ROUTINE X 2)
Culture: NO GROWTH
Culture: NO GROWTH
Special Requests: ADEQUATE
Special Requests: ADEQUATE

## 2018-12-01 LAB — COMPREHENSIVE METABOLIC PANEL
ALT: 609 U/L — ABNORMAL HIGH (ref 0–44)
AST: 226 U/L — ABNORMAL HIGH (ref 15–41)
Albumin: 2.1 g/dL — ABNORMAL LOW (ref 3.5–5.0)
Alkaline Phosphatase: 135 U/L — ABNORMAL HIGH (ref 38–126)
Anion gap: 10 (ref 5–15)
BUN: 111 mg/dL — ABNORMAL HIGH (ref 6–20)
CO2: 25 mmol/L (ref 22–32)
Calcium: 7.9 mg/dL — ABNORMAL LOW (ref 8.9–10.3)
Chloride: 111 mmol/L (ref 98–111)
Creatinine, Ser: 3.07 mg/dL — ABNORMAL HIGH (ref 0.61–1.24)
GFR calc Af Amer: 24 mL/min — ABNORMAL LOW (ref >60)
GFR calc non Af Amer: 21 mL/min — ABNORMAL LOW (ref >60)
Glucose, Bld: 302 mg/dL — ABNORMAL HIGH (ref 70–99)
Potassium: 4 mmol/L (ref 3.5–5.1)
Sodium: 146 mmol/L — ABNORMAL HIGH (ref 135–145)
Total Bilirubin: 0.9 mg/dL (ref 0.3–1.2)
Total Protein: 4.7 g/dL — ABNORMAL LOW (ref 6.5–8.1)

## 2018-12-01 LAB — GLUCOSE, CAPILLARY
Glucose-Capillary: 207 mg/dL — ABNORMAL HIGH (ref 70–99)
Glucose-Capillary: 239 mg/dL — ABNORMAL HIGH (ref 70–99)
Glucose-Capillary: 243 mg/dL — ABNORMAL HIGH (ref 70–99)
Glucose-Capillary: 257 mg/dL — ABNORMAL HIGH (ref 70–99)
Glucose-Capillary: 262 mg/dL — ABNORMAL HIGH (ref 70–99)
Glucose-Capillary: 264 mg/dL — ABNORMAL HIGH (ref 70–99)

## 2018-12-01 LAB — CBC
HCT: 39.3 % (ref 39.0–52.0)
Hemoglobin: 12.4 g/dL — ABNORMAL LOW (ref 13.0–17.0)
MCH: 30 pg (ref 26.0–34.0)
MCHC: 31.6 g/dL (ref 30.0–36.0)
MCV: 94.9 fL (ref 80.0–100.0)
Platelets: 46 10*3/uL — ABNORMAL LOW (ref 150–400)
RBC: 4.14 MIL/uL — ABNORMAL LOW (ref 4.22–5.81)
RDW: 13.3 % (ref 11.5–15.5)
WBC: 9.1 10*3/uL (ref 4.0–10.5)
nRBC: 0.4 % — ABNORMAL HIGH (ref 0.0–0.2)

## 2018-12-01 LAB — TROPONIN I (HIGH SENSITIVITY)
Troponin I (High Sensitivity): 70 ng/L — ABNORMAL HIGH (ref <18)
Troponin I (High Sensitivity): 75 ng/L — ABNORMAL HIGH (ref <18)

## 2018-12-01 MED ORDER — CLONAZEPAM 1 MG PO TABS
1.0000 mg | ORAL_TABLET | Freq: Every day | ORAL | Status: DC
  Administered 2018-12-01 – 2018-12-08 (×7): 1 mg
  Filled 2018-12-01 (×7): qty 1

## 2018-12-01 MED ORDER — METOPROLOL TARTRATE 50 MG PO TABS
100.0000 mg | ORAL_TABLET | Freq: Two times a day (BID) | ORAL | Status: DC
  Administered 2018-12-01 (×2): 100 mg via NASOGASTRIC
  Filled 2018-12-01 (×2): qty 2

## 2018-12-01 MED ORDER — FENTANYL CITRATE (PF) 100 MCG/2ML IJ SOLN
50.0000 ug | INTRAMUSCULAR | Status: DC | PRN
  Administered 2018-12-01 – 2018-12-02 (×8): 100 ug via INTRAVENOUS
  Filled 2018-12-01 (×8): qty 2

## 2018-12-01 MED ORDER — CLONAZEPAM 1 MG PO TABS
2.0000 mg | ORAL_TABLET | Freq: Every day | ORAL | Status: DC
  Administered 2018-12-01 – 2018-12-07 (×7): 2 mg
  Filled 2018-12-01 (×7): qty 2

## 2018-12-01 MED ORDER — FENTANYL CITRATE (PF) 100 MCG/2ML IJ SOLN: 50.0000 ug | INTRAMUSCULAR | Status: DC | PRN

## 2018-12-01 MED ORDER — MIDAZOLAM HCL 2 MG/2ML IJ SOLN
2.0000 mg | INTRAMUSCULAR | Status: DC | PRN
  Administered 2018-12-01 – 2018-12-02 (×4): 2 mg via INTRAVENOUS
  Filled 2018-12-01: qty 2

## 2018-12-01 MED ORDER — PHENYLEPHRINE HCL-NACL 10-0.9 MG/250ML-% IV SOLN
0.0000 ug/min | INTRAVENOUS | Status: DC
  Filled 2018-12-01: qty 250

## 2018-12-01 MED ORDER — INSULIN GLARGINE 100 UNIT/ML ~~LOC~~ SOLN
20.0000 [IU] | Freq: Every day | SUBCUTANEOUS | Status: DC
  Administered 2018-12-01: 17:00:00 20 [IU] via SUBCUTANEOUS
  Filled 2018-12-01 (×2): qty 0.2

## 2018-12-01 MED ORDER — MIDAZOLAM HCL 2 MG/2ML IJ SOLN
2.0000 mg | INTRAMUSCULAR | Status: DC | PRN
  Administered 2018-12-01 (×2): 2 mg via INTRAVENOUS
  Filled 2018-12-01 (×2): qty 2

## 2018-12-01 MED ORDER — FENTANYL 50 MCG/HR TD PT72
1.0000 | MEDICATED_PATCH | TRANSDERMAL | Status: DC
  Administered 2018-12-01: 10:00:00 1 via TRANSDERMAL
  Filled 2018-12-01: qty 1

## 2018-12-01 MED ORDER — FENTANYL CITRATE (PF) 100 MCG/2ML IJ SOLN: 25.0000 ug | INTRAMUSCULAR | Status: DC | PRN

## 2018-12-01 MED ORDER — CLONAZEPAM 0.1 MG/ML ORAL SUSPENSION: 2.0000 mg | Freq: Every day | ORAL | Status: DC

## 2018-12-01 MED ORDER — METOPROLOL TARTRATE 5 MG/5ML IV SOLN
2.5000 mg | Freq: Once | INTRAVENOUS | Status: AC
  Administered 2018-12-01: 22:00:00 2.5 mg via INTRAVENOUS

## 2018-12-01 MED ORDER — SODIUM CHLORIDE 0.9 % IV SOLN
0.0000 ug/min | INTRAVENOUS | Status: DC
  Administered 2018-12-01 (×2): 20 ug/min via INTRAVENOUS
  Administered 2018-12-02: 15:00:00 30 ug/min via INTRAVENOUS
  Administered 2018-12-02: 20 ug/min via INTRAVENOUS
  Administered 2018-12-02: 20:00:00 30 ug/min via INTRAVENOUS
  Administered 2018-12-03 (×4): 50 ug/min via INTRAVENOUS
  Filled 2018-12-01 (×7): qty 10
  Filled 2018-12-01: qty 1
  Filled 2018-12-01 (×2): qty 10

## 2018-12-01 MED ORDER — FREE WATER
300.0000 mL | Status: DC
  Administered 2018-12-01 – 2018-12-04 (×16): 300 mL

## 2018-12-01 MED ORDER — CLONAZEPAM 0.1 MG/ML ORAL SUSPENSION: 1.0000 mg | Freq: Every day | ORAL | Status: DC

## 2018-12-01 NOTE — Progress Notes (Signed)
[facility]  ROUNDING NOTE   Subjective:   Hemodialysis treatment yesterday. Catheter clotted off after 2 hours. No ultrafiltration.   UOP 1725  Objective:  Vital signs in last 24 hours:  Temp:  [98.3 F (36.8 C)-99.5 F (37.5 C)] 99.5 F (37.5 C) (09/12 0800) Pulse Rate:  [29-126] 81 (09/12 0800) Resp:  [23-44] 44 (09/12 0800) BP: (78-143)/(56-104) 78/66 (09/12 0800) SpO2:  [87 %-100 %] 100 % (09/12 0803) FiO2 (%):  [50 %-60 %] 50 % (09/12 0803)  Weight change:  Filed Weights   11/26/18 0312 11/27/18 0456 11/28/18 0451  Weight: (!) 210.9 kg (!) 213.6 kg (!) 216.4 kg    Intake/Output: I/O last 3 completed shifts: In: 2505 [I.V.:3135.1; NG/GT:4266.9; IV Piggyback:100] Out: 2260 [Urine:2260]   Intake/Output this shift:  No intake/output data recorded.  Physical Exam: General: Critically ill   Head: ETT  Eyes: Closed  Neck: Obese  Lungs:  Diminished bilaterally. Pressure Control FiO2 50%  Heart: Irregular, tachycardic  Abdomen:  obese  Extremities: + peripheral edema.  Neurologic: Intubated, sedated  Skin: No lesions  Access:  Left IJ temp HD catheter 9/10 ICU    Basic Metabolic Panel: Recent Labs  Lab 11/25/18 1250 11/26/18 0455 11/27/18 0411 11/28/18 0151 11/28/18 1606 11/28/18 2311 11/29/18 0504 11/30/18 0632 12/01/18 0419  NA 145 145 147* 146*  --   --  147* 147* 146*  K 4.1 4.1 4.0 4.6 4.1 3.9 4.3 4.2 4.0  CL 112* 115* 118* 116*  --   --  115* 111 111  CO2 20* 19* 17* 19*  --   --  _0 GLUCOSE 377* 420* 405* 405*  --   --  154* 241* 302*  BUN 79* 81* 86* 98*  --   --  126* 115* 111*  CREATININE 2.26* 2.25* 2.21* 3.05*  --   --  3.17* 3.11* 3.07*  CALCIUM 8.5* 8.2* 8.0* 7.7*  --   --  7.7* 7.7* 7.9*  MG  --  2.6*  --   --   --   --   --  2.8*  --   PHOS 3.5 4.2  --   --   --   --   --  7.1*  --     Liver Function Tests: Recent Labs  Lab 11/25/18 1250 12/01/18 0419  AST  --  226*  ALT  --  609*  ALKPHOS  --  135*   BILITOT  --  0.9  PROT  --  4.7*  ALBUMIN 2.6* 2.1*   No results for input(s): LIPASE, AMYLASE in the last 168 hours. No results for input(s): AMMONIA in the last 168 hours.  CBC: Recent Labs  Lab 11/27/18 0411 11/28/18 0151 11/29/18 0504 11/30/18 0632 12/01/18 0419  WBC 12.5* 13.3* 11.7* 8.9 9.1  NEUTROABS  --   --   --  8.3*  --   HGB 13.7 12.5* 12.9* 13.0 12.4*  HCT 43.2 41.5 41.2 40.4 39.3  MCV 94.1 99.8 95.6 93.7 94.9  PLT 89* 72* 57* 45* 46*    Cardiac Enzymes: No results for input(s): CKTOTAL, CKMB, CKMBINDEX, TROPONINI in the last 168 hours.  BNP: Invalid input(s): POCBNP  CBG: Recent Labs  Lab 11/30/18 1640 11/30/18 1954 11/30/18 2341 12/01/18 0356 12/01/18 0728  GLUCAP 243* 227* 229* 257* 207*    Microbiology: Results for orders placed or performed during the hospital encounter of 11/02/2018  SARS CORONAVIRUS 2 (TAT 6-12 HRS) Nasal Swab Aptima Multi  Swab     Status: None   Collection Time: 10/24/2018  4:55 PM   Specimen: Aptima Multi Swab; Nasal Swab  Result Value Ref Range Status   SARS Coronavirus 2 NEGATIVE NEGATIVE Final    Comment: (NOTE) SARS-CoV-2 target nucleic acids are NOT DETECTED. The SARS-CoV-2 RNA is generally detectable in upper and lower respiratory specimens during the acute phase of infection. Negative results do not preclude SARS-CoV-2 infection, do not rule out co-infections with other pathogens, and should not be used as the sole basis for treatment or other patient management decisions. Negative results must be combined with clinical observations, patient history, and epidemiological information. The expected result is Negative. Fact Sheet for Patients: SugarRoll.be Fact Sheet for Healthcare Providers: https://www.woods-mathews.com/ This test is not yet approved or cleared by the [place] FDA and  has been authorized for detection and/or diagnosis of SARS-CoV-2 by FDA under an  Emergency Use Authorization (EUA). This EUA will remain  in effect (meaning this test can be used) for the duration of the COVID-19 declaration under Section 56 4(b)(1) of the Act, 21 U.S.C. section 360bbb-3(b)(1), unless the authorization is terminated or revoked sooner. Performed at [facility], [place] [address], [place], [place] [address]   SARS Coronavirus 2 [facility] order, Performed in [facility] hospital lab) Nasopharyngeal Nasopharyngeal Swab     Status: None   Collection Time: 11/02/2018 10:34 PM   Specimen: Nasopharyngeal Swab  Result Value Ref Range Status   SARS Coronavirus 2 NEGATIVE NEGATIVE Final    Comment: (NOTE) If result is NEGATIVE SARS-CoV-2 target nucleic acids are NOT DETECTED. The SARS-CoV-2 RNA is generally detectable in upper and lower  respiratory specimens during the acute phase of infection. The lowest  concentration of SARS-CoV-2 viral copies this assay can detect is 250  copies / mL. A negative result does not preclude SARS-CoV-2 infection  and should not be used as the sole basis for treatment or other  patient management decisions.  A negative result may occur with  improper specimen collection / handling, submission of specimen other  than nasopharyngeal swab, presence of viral mutation(s) within the  areas targeted by this assay, and inadequate number of viral copies  (<250 copies / mL). A negative result must be combined with clinical  observations, patient history, and epidemiological information. If result is POSITIVE SARS-CoV-2 target nucleic acids are DETECTED. The SARS-CoV-2 RNA is generally detectable in upper and lower  respiratory specimens dur ing the acute phase of infection.  Positive  results are indicative of active infection with SARS-CoV-2.  Clinical  correlation with patient history and other diagnostic information is  necessary to determine patient infection status.  Positive results do  not rule out bacterial infection or  co-infection with other viruses. If result is PRESUMPTIVE POSTIVE SARS-CoV-2 nucleic acids MAY BE PRESENT.   A presumptive positive result was obtained on the submitted specimen  and confirmed on repeat testing.  While 2019 novel coronavirus  (SARS-CoV-2) nucleic acids may be present in the submitted sample  additional confirmatory testing may be necessary for epidemiological  and / or clinical management purposes  to differentiate between  SARS-CoV-2 and other Sarbecovirus currently known to infect humans.  If clinically indicated additional testing with an alternate test  methodology [phone]) is advised. The SARS-CoV-2 RNA is generally  detectable in upper and lower respiratory sp ecimens during the acute  phase of infection. The expected result is Negative. Fact Sheet for Patients:  StrictlyIdeas.no Fact Sheet for Healthcare  Providers: BankingDealers.co.za This test is not yet approved or cleared by the [place] and has been authorized for detection and/or diagnosis of SARS-CoV-2 by FDA under an Emergency Use Authorization (EUA).  This EUA will remain in effect (meaning this test can be used) for the duration of the COVID-19 declaration under Section 564(b)(1) of the Act, 21 U.S.C. section 360bbb-3(b)(1), unless the authorization is terminated or revoked sooner. Performed at [facility], [address] [place], [place] [address]   Culture, respiratory (non-expectorated)     Status: None   Collection Time: 11/05/2018 10:34 PM   Specimen: Tracheal Aspirate; Respiratory  Result Value Ref Range Status   Specimen Description   Final    TRACHEAL ASPIRATE Performed at [facility], [address] [place], [place] [address]    Special Requests   Final    NONE Performed at [facility], [address] [place], [place] [address]    Gram Stain   Final    RARE WBC PRESENT, PREDOMINANTLY  PMN ABUNDANT GRAM POSITIVE COCCI IN CHAINS    Culture   Final    Consistent with normal respiratory flora. Performed at [facility], [place] [address] [place], [place] [address]    Report Status 11/19/2018 FINAL  Final  Urine Culture     Status: None   Collection Time: 11/13/2018 11:12 PM   Specimen: Urine, Random  Result Value Ref Range Status   Specimen Description   Final    URINE, RANDOM Performed at [facility], [address], [place], [place] [address]    Special Requests   Final    NONE Performed at [facility], [address], [place], [place] [address]    Culture   Final    NO GROWTH Performed at [facility], [place] [address] [place], [place] [address]    Report Status 11/18/2018 FINAL  Final  MRSA PCR Screening     Status: None   Collection Time: 11/08/2018 11:28 PM   Specimen: Nasopharyngeal  Result Value Ref Range Status   MRSA by PCR NEGATIVE NEGATIVE Final    Comment:        The GeneXpert MRSA Assay (FDA approved for NASAL specimens only), is one component of a comprehensive MRSA colonization surveillance program. It is not intended to diagnose MRSA infection nor to guide or monitor treatment for MRSA infections. Performed at [facility], [address] [place], [place] [address]   Culture, blood (Routine X 2) w Reflex to ID Panel     Status: None   Collection Time: 11/17/18 12:19 AM   Specimen: BLOOD  Result Value Ref Range Status   Specimen Description BLOOD RIGHT ANTECUBITAL  Final   Special Requests   Final    BOTTLES DRAWN AEROBIC AND ANAEROBIC Blood Culture adequate volume   Culture   Final    NO GROWTH 5 DAYS Performed at [facility], [address], [place], [place] [address]    Report Status 11/22/2018 FINAL  Final  Culture, blood (Routine X 2) w Reflex to ID Panel     Status: None   Collection Time: 11/17/18 12:31 AM   Specimen: BLOOD  Result Value Ref Range Status   Specimen  Description BLOOD CENTRAL LINE MIDLINE  Final   Special Requests   Final    BOTTLES DRAWN AEROBIC AND ANAEROBIC Blood Culture adequate volume   Culture   Final    NO GROWTH 5 DAYS Performed  at [facility], [address] [place], [place] [address]    Report Status 11/22/2018 FINAL  Final  Culture, bal-quantitative     Status: Abnormal   Collection Time: 11/22/18  7:48 AM   Specimen: Bronchoalveolar Lavage; Respiratory  Result Value Ref Range Status   Specimen Description   Final    BRONCHIAL ALVEOLAR LAVAGE Performed at [facility], [address] [place], [place] [address]    Special Requests   Final    Immunocompromised Performed at [facility], [place], [place] [address]    Gram Stain   Final    FEW WBC PRESENT, PREDOMINANTLY PMN FEW GRAM POSITIVE COCCI IN PAIRS IN CLUSTERS FEW GRAM POSITIVE RODS Performed at [facility], [place] [address], [place], [place] [address]    Culture (A)  Final    >=100,000 COLONIES/mL STAPHYLOCOCCUS SIMULANS >=100,000 COLONIES/mL STREPTOCOCCUS MITIS/ORALIS    Report Status 11/25/2018 FINAL  Final   Organism ID, Bacteria STAPHYLOCOCCUS SIMULANS (A)  Final   Organism ID, Bacteria STREPTOCOCCUS MITIS/ORALIS (A)  Final      Susceptibility   Staphylococcus simulans - MIC*    CIPROFLOXACIN <=0.5 SENSITIVE Sensitive     ERYTHROMYCIN <=0.25 SENSITIVE Sensitive     GENTAMICIN <=0.5 SENSITIVE Sensitive     OXACILLIN <=0.25 SENSITIVE Sensitive     TETRACYCLINE >=16 RESISTANT Resistant     VANCOMYCIN <=0.5 SENSITIVE Sensitive     TRIMETH/SULFA <=10 SENSITIVE Sensitive     CLINDAMYCIN <=0.25 SENSITIVE Sensitive     RIFAMPIN <=0.5 SENSITIVE Sensitive     Inducible Clindamycin NEGATIVE Sensitive     * >=100,000 COLONIES/mL STAPHYLOCOCCUS SIMULANS   Streptococcus mitis/oralis - MIC*    TETRACYCLINE >=16 RESISTANT Resistant     VANCOMYCIN 0.25 SENSITIVE Sensitive     CLINDAMYCIN 0.5 INTERMEDIATE  Intermediate     * >=100,000 COLONIES/mL STREPTOCOCCUS MITIS/ORALIS  CULTURE, BLOOD (ROUTINE X 2) w Reflex to ID Panel     Status: None   Collection Time: 11/26/18 10:32 PM   Specimen: BLOOD  Result Value Ref Range Status   Specimen Description BLOOD RIGHT ANTECUBITAL  Final   Special Requests   Final    BOTTLES DRAWN AEROBIC AND ANAEROBIC Blood Culture adequate volume   Culture   Final    NO GROWTH 5 DAYS Performed at [facility], [address] [place], [place] [address]    Report Status 12/01/2018 FINAL  Final  CULTURE, BLOOD (ROUTINE X 2) w Reflex to ID Panel     Status: None   Collection Time: 11/26/18 10:32 PM   Specimen: BLOOD  Result Value Ref Range Status   Specimen Description BLOOD BLOOD RIGHT HAND  Final   Special Requests   Final    BOTTLES DRAWN AEROBIC AND ANAEROBIC Blood Culture adequate volume   Culture   Final    NO GROWTH 5 DAYS Performed at [facility], [address] [place], [place] [address]    Report Status 12/01/2018 FINAL  Final  Respiratory Panel by PCR     Status: None   Collection Time: 11/28/18 10:36 AM   Specimen: Nasopharyngeal Swab; Respiratory  Result Value Ref Range Status   Adenovirus NOT DETECTED NOT DETECTED Final   Coronavirus 229E NOT DETECTED NOT DETECTED Final    Comment: (NOTE) The Coronavirus on the Respiratory Panel, DOES NOT test for the novel  Coronavirus (2019 nCoV)    Coronavirus HKU1 NOT DETECTED NOT DETECTED Final   Coronavirus NL63  NOT DETECTED NOT DETECTED Final   Coronavirus OC43 NOT DETECTED NOT DETECTED Final   Metapneumovirus NOT DETECTED NOT DETECTED Final   Rhinovirus / Enterovirus NOT DETECTED NOT DETECTED Final   Influenza A NOT DETECTED NOT DETECTED Final   Influenza B NOT DETECTED NOT DETECTED Final   Parainfluenza Virus 1 NOT DETECTED NOT DETECTED Final   Parainfluenza Virus 2 NOT DETECTED NOT DETECTED Final   Parainfluenza Virus 3 NOT DETECTED NOT DETECTED Final   Parainfluenza Virus  4 NOT DETECTED NOT DETECTED Final   Respiratory Syncytial Virus NOT DETECTED NOT DETECTED Final   Bordetella pertussis NOT DETECTED NOT DETECTED Final   Chlamydophila pneumoniae NOT DETECTED NOT DETECTED Final   Mycoplasma pneumoniae NOT DETECTED NOT DETECTED Final    Comment: Performed at [facility], [place] [address], [place], [place] [address]  Culture, respiratory (non-expectorated)     Status: None   Collection Time: 11/28/18 11:29 AM   Specimen: Tracheal Aspirate; Respiratory  Result Value Ref Range Status   Specimen Description   Final    TRACHEAL ASPIRATE Performed at [facility], [address] [place], [place] [address]    Special Requests   Final    NONE Performed at [facility], [address] [place], [place] [address]    Gram Stain   Final    FEW WBC PRESENT, PREDOMINANTLY PMN FEW SQUAMOUS EPITHELIAL CELLS PRESENT RARE GRAM POSITIVE COCCI IN PAIRS    Culture   Final    FEW Consistent with normal respiratory flora. Performed at [facility], [place] [address] [place], [place] [address]    Report Status 11/30/2018 FINAL  Final    Coagulation Studies: No results for input(s): LABPROT, INR in the last 72 hours.  Urinalysis: No results for input(s): COLORURINE, LABSPEC, PHURINE, GLUCOSEU, HGBUR, BILIRUBINUR, KETONESUR, PROTEINUR, UROBILINOGEN, NITRITE, LEUKOCYTESUR in the last 72 hours.  Invalid input(s): APPERANCEUR    Imaging: Dg Abd 1 View  Result Date: 11/30/2018 CLINICAL DATA:  OG tube placement. EXAM: ABDOMEN - 1 VIEW COMPARISON:  11/26/2018 FINDINGS: 1329 hours. Lower left chest and upper left abdomen have been included in the field of view. NG tube tip is identified in the mid stomach. Cardiopericardial silhouette appears enlarged with left base retrocardiac collapse/consolidation. IMPRESSION: OG tube tip is in the mid stomach. Electronically Signed   By: Misty [place] M.D.   On: 11/30/2018 15:21   Dg Chest Port 1  View  Result Date: 12/01/2018 CLINICAL DATA:  Respiratory failure EXAM: PORTABLE CHEST 1 VIEW COMPARISON:  Numerous prior radiographs, most recently 11/30/2010 FINDINGS: Endotracheal tube is positioned within the upper trachea approximately 6 cm from the carina. Distal extent of the transesophageal tube is poorly visualized due to radiographic underpenetration. Left IJ approach central venous catheter tip terminates in the brachiocephalic vein. Right IJ catheter tip terminates near the superior cavoatrial junction. Redemonstration the bilateral pleural effusions which obscure the hemidiaphragms and resulting gradient basilar opacity. Bilateral airspace disease is grossly similar to comparison study accounting for differences in technique. There is redemonstration of the metallic cerclage wire associated with the right ribs. IMPRESSION: 1. Endotracheal tube tip approximately 6 cm from the carina. 2. Distal extent of the transesophageal tube is poorly visualized due to radiographic underpenetration. 3. Left IJ catheter terminates in the brachiocephalic vein. Right IJ catheter at the superior cavoatrial junction. 4. Grossly stable bilateral pleural effusions and bilateral airspace disease. Electronically Signed   By: Lovena Le M.D.   On: 12/01/2018  03:24   Dg Chest Port 1 View  Result Date: 11/30/2018 CLINICAL DATA:  Acute on chronic respiratory failure with severe hypoxia. ARDS with fever. Pneumonia. EXAM: PORTABLE CHEST 1 VIEW COMPARISON:  Single view of the chest 11/29/2018 and 11/28/2018. FINDINGS: Support tubes and lines are unchanged. Marked cardiomegaly is again seen. Left worse than right pleural effusions and airspace disease persist without change. IMPRESSION: No change in left worse than right pleural effusions and airspace disease. Marked cardiomegaly. No change in support apparatus. Electronically Signed   By: Inge Rise M.D.   On: 11/30/2018 08:37   Dg Chest Port 1 View  Result Date:  11/29/2018 CLINICAL DATA:  Central line placement. EXAM: PORTABLE CHEST 1 VIEW COMPARISON:  11/28/2018 FINDINGS: [name] rotated to the left. Endotracheal tube has tip 6.8 cm above the carina. Enteric tube courses into the stomach as tip is not visualized. Right IJ central venous catheter unchanged with tip over the SVC. Interval placement of left IJ central venous catheter with tip obliquely oriented over the left brachiocephalic vein in the midline. Lungs are adequately inflated with mild stable hazy density over the left base likely small left effusion with associated atelectasis. Mild stable prominence of the perihilar markings likely mild vascular congestion. Moderate stable cardiomegaly. Remainder of the exam is unchanged. IMPRESSION: Moderate stable cardiomegaly with mild vascular congestion. Stable hazy density over the left base likely small effusion with associated atelectasis. Tubes and lines as described. Interval placement of left IJ central venous catheter with tip in the midline over the brachiocephalic vein. Electronically Signed   By: Marin Olp M.D.   On: 11/29/2018 15:44     Medications:   . sodium chloride    . cefTRIAXone (ROCEPHIN)  IV Stopped (11/30/18 1101)  . feeding supplement (VITAL HIGH PROTEIN) 45 mL/hr at 12/01/18 0600  . phenylephrine (NEO-SYNEPHRINE) Adult infusion     . aspirin  81 mg Per NG tube Daily  . chlorhexidine gluconate (MEDLINE KIT)  15 mL Mouth Rinse BID  . Chlorhexidine Gluconate Cloth  6 each Topical Daily  . clonazePAM  1 mg Per Tube Daily  . clonazePAM  2 mg Per Tube QHS  . feeding supplement (PRO-STAT SUGAR FREE 64)  60 mL Per Tube TID  . fentaNYL  1 patch Transdermal Q72H  . free water  300 mL Per Tube Q4H  . insulin aspart  0-20 Units Subcutaneous Q4H  . ipratropium-albuterol  3 mL Nebulization Q6H  . mouth rinse  15 mL Mouth Rinse 10 times per day  . metoprolol tartrate  100 mg Per NG tube BID  . multivitamin  15 mL Per Tube Daily  .  pantoprazole sodium  40 mg Per Tube QHS  . polyethylene glycol  17 g Per Tube BID  . senna-docusate  2 tablet Per Tube BID  . sodium chloride flush  10-40 mL Intracatheter Q12H  . sodium chloride flush  3 mL Intravenous Q12H   sodium chloride, acetaminophen, bisacodyl, fentaNYL (SUBLIMAZE) injection, fentaNYL (SUBLIMAZE) injection, fentaNYL (SUBLIMAZE) injection, metoprolol tartrate, midazolam, midazolam, midazolam, ondansetron (ZOFRAN) IV, sodium chloride flush  Assessment/ Plan:  Mr. Kyle White is a 58 y.o. white male with atrial fibrillation on warfarin, congestive heart failure, diabetes mellitus type 2, obstructive sleep apnea, morbid obesity, gout, mitral valve replacement who was admitted to [facility] on 10/26/2018 for acute exacerbation of COPD.  1.  Acute renal failure with metabolic acidosis on chronic kidney disease stage III: creatinine on admission of 1.31, GFR of 53.  Baseline creatinine appears to be 1.3 with an EGFR 57.  Urinalysis with proteinuria. Acute renal failure secondary to acute cardiorenal syndrome.  Nonoliguric urine output.  - With uremia and concern for uremic encephalopathy. Completed two dialysis treatment. Hold dialysis today as urine output has improved. Monitor daily for dialysis need.   2.  Acute respiratory failure: requiring mechanical ventilation. With pneumonia. Pickwickian syndrome - meropenem.  - Appreciate critical care input  3. Hypotension: off vasopressors. Restarted metoprolol.  - vasopressors as per critical care team  4. Diastolic congestive heart failure: acute exacerbation.     LOS: 15 Ainslee Sou 9/12/20209:29 AM

## 2018-12-01 NOTE — Progress Notes (Signed)
[facility] [place] NAME: Kyle White    MR#:  [id number]  DATE OF BIRTH:  Apr 06, 1960  SUBJECTIVE:  Pt is afebrile,remains intubated on the ventilator, severely hypoxemic with underlying pickwickian syndrome,unsuccessful weaning trials.  Critically ill, uremic, started hemodialysis  underwent bronchoscopy 11/22/18.  Palliative care team is involved Sister [name] is following   REVIEW OF SYSTEMS:   Review of Systems  Unable to perform ROS: Intubated  Psychiatric/Behavioral: Nervous/anxious:      DRUG ALLERGIES:   No Active Allergies  VITALS:  Blood pressure 120/90, pulse 92, temperature (!) 97.5 F (36.4 C), temperature source Oral, resp. rate (!) 28, height 5' 9.02" (1.753 m), weight (!) 216.4 kg, SpO2 91 %.  PHYSICAL EXAMINATION:   Physical Exam  GENERAL:  58 y.o.-year-old patient lying in the bed with no acute distress. Morbidly obese critically ill intubated on the vent EYES: Pupils equal, round, reactive to light and accommodation. No scleral icterus. Extraocular muscles intact.  HEENT: Head atraumatic, normocephalic. Oropharynx and nasopharynx clear.  NECK:  Supple, no jugular venous distention. No thyroid enlargement, no tenderness.  LUNGS: Mod breath sounds bilaterally, positive wheezing, rales, rhonchi. No use of accessory muscles of respiration.  CARDIOVASCULAR: S1, S2 normal. No murmurs, rubs, or gallops.  ABDOMEN: Soft, nontender, nondistended. Bowel sounds present. No organomegaly or mass.  EXTREMITIES: No cyanosis, clubbing or edema b/l.    NEUROLOGIC: sedated GCS less than 8 PSYCHIATRIC: sedated  And on the vent SKIN: No obvious rash, lesion, or ulcer.   LABORATORY PANEL:  CBC Recent Labs  Lab 12/01/18 0419  WBC 9.1  HGB 12.4*  HCT 39.3  PLT 46*    Chemistries  Recent Labs  Lab 11/30/18 0632 12/01/18 0419  NA 147* 146*  K 4.2 4.0  CL 111 111  CO2 23 25  GLUCOSE 241* 302*  BUN 115* 111*   CREATININE 3.11* 3.07*  CALCIUM 7.7* 7.9*  MG 2.8*  --   AST  --  226*  ALT  --  609*  ALKPHOS  --  135*  BILITOT  --  0.9   Cardiac Enzymes No results for input(s): TROPONINI in the last 168 hours. RADIOLOGY:  Dg Abd 1 View  Result Date: 11/30/2018 CLINICAL DATA:  OG tube placement. EXAM: ABDOMEN - 1 VIEW COMPARISON:  11/26/2018 FINDINGS: 1329 hours. Lower left chest and upper left abdomen have been included in the field of view. NG tube tip is identified in the mid stomach. Cardiopericardial silhouette appears enlarged with left base retrocardiac collapse/consolidation. IMPRESSION: OG tube tip is in the mid stomach. Electronically Signed   By: Misty Stanley M.D.   On: 11/30/2018 15:21   Dg Chest Port 1 View  Result Date: 12/01/2018 CLINICAL DATA:  Respiratory failure EXAM: PORTABLE CHEST 1 VIEW COMPARISON:  Numerous prior radiographs, most recently 11/30/2010 FINDINGS: Endotracheal tube is positioned within the upper trachea approximately 6 cm from the carina. Distal extent of the transesophageal tube is poorly visualized due to radiographic underpenetration. Left IJ approach central venous catheter tip terminates in the brachiocephalic vein. Right IJ catheter tip terminates near the superior cavoatrial junction. Redemonstration the bilateral pleural effusions which obscure the hemidiaphragms and resulting gradient basilar opacity. Bilateral airspace disease is grossly similar to comparison study accounting for differences in technique. There is redemonstration of the metallic cerclage wire associated with the right ribs. IMPRESSION: 1. Endotracheal tube tip approximately 6 cm from the carina. 2. Distal extent of the transesophageal tube  is poorly visualized due to radiographic underpenetration. 3. Left IJ catheter terminates in the brachiocephalic vein. Right IJ catheter at the superior cavoatrial junction. 4. Grossly stable bilateral pleural effusions and bilateral airspace disease.  Electronically Signed   By: Lovena Le M.D.   On: 12/01/2018 03:24   Dg Chest Port 1 View  Result Date: 11/30/2018 CLINICAL DATA:  Acute on chronic respiratory failure with severe hypoxia. ARDS with fever. Pneumonia. EXAM: PORTABLE CHEST 1 VIEW COMPARISON:  Single view of the chest 11/29/2018 and 11/28/2018. FINDINGS: Support tubes and lines are unchanged. Marked cardiomegaly is again seen. Left worse than right pleural effusions and airspace disease persist without change. IMPRESSION: No change in left worse than right pleural effusions and airspace disease. Marked cardiomegaly. No change in support apparatus. Electronically Signed   By: Inge Rise M.D.   On: 11/30/2018 08:37   ASSESSMENT AND PLAN:  [name]  is a 58 y.o. male with a known history of atrial fibrillation on Coumadin, CHF, diabetes mellitus, and obstructive sleep apnea with CPAP use at home, morbid obesity with sedentary lifestyle on oxygen at 2 L/min at home.  1.Acute on chronic  respiratory failure with severe hypoxia-ARDS with pneumonia  underlying pickwickian syndrome -No clinical improvement, unsuccessful weaning trials - Extubated on 8/31 but required reintubation, vent management per PCCM team.  -patient underwent bronchoscopy 11/22/2018. 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 -vent support --- extremely poor prognosis with high mortality and morbidity -Continue tube feeds with pro-stat -Antibiotics have been changed from meropenem, vancomycin and IV steroids to Rocephin -versed   -Septic not present on admission from pneumonia with ARDS Continue IV antibiotics Goal is to keep map greater than or equal to 65 IV steroids  #.Obstructive sleep apnea-- chronic  #Acute on chronic diastolic CHF, EF 55 to 123456 on echo in 2014 at [facility] -Repeat echocardiogram shows EF of 55 to 60%  # History of atrial fibrillation -Coumadin on hold  -Heparin drip drip discontinued in view of thrombocytopenia  -HIT  panel is pending   #. Acute renal failure on chronic kidney disease stage III  with baseline creatinine 1.28-1.89-1.87-2.25-2.21-3.05--3.17-3.11 -Repeat BMP in the a.m. and continue to monitor renal function closely -Patient being uremic nephrology started the patient on hemodialysis  #  Type IIDiabetes mellitus -Sliding scale insulin -Hemoglobin A1c 7.3  # Generalized weakness -Physical therapy eval once extubated  #Adult failure to thrive Palliative care team is involved and talking to patient's sister Ms. [name] she is in denial, does not think patient is on life support and believes that patient is clinically improving.  Follow-up with palliative care   Overall poor prognosis. Appreciate palliative care input.   CODE STATUS: DNR  DVT Prophylaxis: scd as patient is thrombocytopenic  TOTAL TIME TAKING CARE OF THIS PATIENT:25 minutes.  >50% time spent on counselling and coordination of care  POSSIBLE D/C IN *?* DAYS, DEPENDING ON CLINICAL CONDITION.  Note: This dictation was prepared with Dragon dictation along with smaller phrase technology. Any transcriptional errors that result from this process are unintentional.   Fritzi Mandes M.D on 12/01/2018 at 3:36 PM  Between 7am to 6pm - Pager - [phone]  After 6pm go to [email or web address] - password [email or web address] [facility]  Office  [phone]  CC: Primary care physician; Inc, [facility] ServicesPatient ID: Essam Parkman, male   DOB: 01-20-1961, 58 y.o.   MRN: [id number]

## 2018-12-01 NOTE — Progress Notes (Signed)
PULMONARY/CCM PROGRESS NOTE  PT PROFILE: 58 y.o. M with hx of type 2 diabetes, chronic AF, pickwickian syndrome, hypertension, congestive heart failure adm 8/28 via ED to ICU/SDU with acute on chronic respiratory failure felt to be due to CHF/pulmonary edema  MAJOR EVENTS/TEST RESULTS: 08/28 admitted to ICU/SDU for BiPAP. PCCM consultation 08/28 emergently intubated 08/29 Echocardiogram: EF 55-60%.  LV cavity mildly dilated.  Diastolic function could not be evaluated.  Left atrium mildly dilated.  RV not assessed.  RA size not well visualized. 08/29 CTAP: No acute intra-abdominal process noted.  No perforation.  Trace ascites.  Severe hepatic steatosis.  Cholelithiasis.  Small R and trace L pleural effusions.  Bilateral lower lobe airspace disease may represent atelectasis but pneumonia is difficult to exclude, particularly on the R. 08/30 Extubated 08/30 Cardiology consultation: recommended diltiazem gtt for rapid AF and continue anticoagulation 08/31 reintubated emergently 08/31 bronchoscopy for mucous plugging and complete collapse of R lung 08/31 Palliative Care consultation to address goals of care 09/03 DNR established 09/05 Nephrology consultation for AKI 09/06 Renal US: Normal appearance of the kidneys 09/06 requiring 100% FiO2 and PEEP 15 cm H2O 09/07 ETT cuff leak, ETT changed 09/08 CTH: No acute intracranial abnormality seen 09/10 HD cath placed and intermittent HD initiated 09/11 + F/C. Improving vent requirements. 2 hrs of HD performed 09/12 improving vent requirements.  Low-dose clonazepam and Duragesic patch initiated.  Goals of care discussion with patient's sister and recommended tracheostomy tube.  INDWELLING DEVICES:: ETT 08/28 >> 08/30, 08/31 >> 09/07 (cuff leak), 09/07 >>  R IJ CVL 08/31 >>  L IJ HD cath 09/10 >>   MICRO DATA: SARS CoV 2 PCR 08/28 >> NEG MRSA PCR 08/28 >> NEG Urine 08/28 >> NEG Resp  08/28 >> abundant GPC chains Blood 08/29 >> NEG BAL 09/03 >>  Staph simulans, strep mitis Blood 09/07 >> NEG RVP 09/09 >> NEG Resp 09/09 >> c/w NOF  ANTIMICROBIALS:  Cefepime 09/03 >> 09/08  Vancomycin 09/07 >> 09/10 Meropenem 09/09 >> 09/11 Ceftriaxone 09/11 >>    SUBJ: RASS -2. + F/C.  Synchronous with ventilator  OBJ: Vitals:   12/01/18 1100 12/01/18 1142 12/01/18 1153 12/01/18 1154  BP: (!) 83/64 97/69    Pulse: (!) 37 67 (!) 56 (!) 52  Resp: (!) 28 (!) 25 (!) 25 (!) 26  Temp:  (!) 97.5 F (36.4 C)    TempSrc:  Oral    SpO2: 99% (!) 88% 96% 96%  Weight:      Height:       Vent Mode: PCV FiO2 (%):  [50 %-100 %] 80 % Set Rate:  [15 bmp] 15 bmp PEEP:  [12 cmH20] 12 cmH20 Plateau Pressure:  [22 cmH20-24 cmH20] 24 cmH20   Gen: Intubated, sedated, synchronous with vent HEENT: NCAT, sclerae white Neck: JVP cannot be visualized Lungs: Clear anteriorly Cardiovascular: Irregular, HR controlled, no M noted Abdomen: Severe centripetal obesity, abdomen soft, NT, + BS Ext: Warm, trace symmetric edema Neuro: No focal deficits noted Skin: No lesions noted   BMP Latest Ref Rng & Units 12/01/2018 11/30/2018 11/29/2018  Glucose 70 - 99 mg/dL 302(H) 241(H) 154(H)  BUN 6 - 20 mg/dL 111(H) 115(H) 126(H)  Creatinine 0.61 - 1.24 mg/dL 3.07(H) 3.11(H) 3.17(H)  Sodium 135 - 145 mmol/L 146(H) 147(H) 147(H)  Potassium 3.5 - 5.1 mmol/L 4.0 4.2 4.3  Chloride 98 - 111 mmol/L 111 111 115(H)  CO2 22 - 32 mmol/L _0 Calcium 8.9 - 10.3 mg/dL 7.9(L)  7.7(L) 7.7(L)    Hepatic Function Latest Ref Rng & Units 12/01/2018 11/25/2018 11/17/2018  Total Protein 6.5 - 8.1 g/dL 4.7(L) - 6.6  Albumin 3.5 - 5.0 g/dL 2.1(L) 2.6(L) 3.5  AST 15 - 41 U/L 226(H) - 20  ALT 0 - 44 U/L 609(H) - 25  Alk Phosphatase 38 - 126 U/L 135(H) - 64  Total Bilirubin 0.3 - 1.2 mg/dL 0.9 - 1.0    CBC Latest Ref Rng & Units 12/01/2018 11/30/2018 11/29/2018  WBC 4.0 - 10.5 K/uL 9.1 8.9 11.7(H)  Hemoglobin 13.0 - 17.0 g/dL 12.4(L) 13.0 12.9(L)  Hematocrit 39.0 - 52.0 % 39.3 40.4  41.2  Platelets 150 - 400 K/uL 46(L) 45(L) 57(L)    ABG    Component Value Date/Time   PHART 7.34 (L) 11/28/2018 0911   PCO2ART 33 11/28/2018 0911   PO2ART 67 (L) 11/28/2018 0911   HCO3 17.8 (L) 11/28/2018 0911   ACIDBASEDEF 7.0 (H) 11/28/2018 0911   O2SAT 91.7 11/28/2018 0911    CXR: Very poor quality film.  Cardiomegaly, interstitial edema, probable B LL atelectasis   IMPRESSION: Prolonged VDRF Pulmonary edema pattern on CXR Bilateral pleural effusions Probable ARDS, resolving Bacterial pneumonia (staph and strep species on BAL) Pickwickian syndrome Chronic atrial fibrillation, rate controlled AKI, nonoliguric Mild hypernatremia Morbid obesity Type 2 diabetes with hyperglycemia Thrombocytopenia.  Heparin-induced platelet antibodies negative Acute encephalopathy, multifactorial.  Improving ICU/ventilator associated discomfort  PLAN/REC: Cont full vent support - settings reviewed and/or adjusted Cont vent bundle Daily SBT if/when meets criteria Continue scheduled nebulized bronchodilators Continue ICU HD monitoring Metoprolol dose increased 9/12 Cont PRN IV metoprolol to maintain HR < 115/min Monitor BMET intermittently Monitor I/Os Correct electrolytes as indicated Continue free water repletion -increased 9/12 Continue SSI protocol, resistant scale Lantus initiated 9/12 Continue TF protocol Monitor temp, WBC count Micro and abx as above DVT px: SCDs Monitor CBC intermittently Transfuse per usual guidelines Scheduled clonazepam initiated 9/12 Duragesic patch initiated 9/12 PAD protocol: Intermittent midazolam and fentanyl.  RASS goal -1, -2   Sister updated at bedside.  I reported that patient has generally improved compared to several days ago.  Ventilator requirements are improving.  Urine output is improving.  Cognition has improved significantly.  I advocated that we proceed with tracheostomy tube next week.  She still expresses some concern that he  might not wish to undergo tracheostomy tube.  I informed her that, when his cognition permits, he can asked to have it removed.  CCM time: 40 mins The above time includes time spent in consultation with patient and/or family members and reviewing care plan on multidisciplinary rounds  Merton Border, MD PCCM service Mobile [phone] Pager [phone] 12/01/2018 12:28 PM

## 2018-12-02 ENCOUNTER — Inpatient Hospital Stay: Payer: Medicare Other

## 2018-12-02 DIAGNOSIS — J9811 Atelectasis: Secondary | ICD-10-CM

## 2018-12-02 LAB — CBC
HCT: 41.6 % (ref 39.0–52.0)
Hemoglobin: 12.7 g/dL — ABNORMAL LOW (ref 13.0–17.0)
MCH: 29.9 pg (ref 26.0–34.0)
MCHC: 30.5 g/dL (ref 30.0–36.0)
MCV: 97.9 fL (ref 80.0–100.0)
Platelets: 61 10*3/uL — ABNORMAL LOW (ref 150–400)
RBC: 4.25 MIL/uL (ref 4.22–5.81)
RDW: 13.7 % (ref 11.5–15.5)
WBC: 13.2 10*3/uL — ABNORMAL HIGH (ref 4.0–10.5)
nRBC: 1.2 % — ABNORMAL HIGH (ref 0.0–0.2)

## 2018-12-02 LAB — BASIC METABOLIC PANEL
Anion gap: 10 (ref 5–15)
BUN: 128 mg/dL — ABNORMAL HIGH (ref 6–20)
CO2: 26 mmol/L (ref 22–32)
Calcium: 8.3 mg/dL — ABNORMAL LOW (ref 8.9–10.3)
Chloride: 111 mmol/L (ref 98–111)
Creatinine, Ser: 3.27 mg/dL — ABNORMAL HIGH (ref 0.61–1.24)
GFR calc Af Amer: 23 mL/min — ABNORMAL LOW (ref >60)
GFR calc non Af Amer: 20 mL/min — ABNORMAL LOW (ref >60)
Glucose, Bld: 252 mg/dL — ABNORMAL HIGH (ref 70–99)
Potassium: 4.3 mmol/L (ref 3.5–5.1)
Sodium: 147 mmol/L — ABNORMAL HIGH (ref 135–145)

## 2018-12-02 LAB — GLUCOSE, CAPILLARY
Glucose-Capillary: 233 mg/dL — ABNORMAL HIGH (ref 70–99)
Glucose-Capillary: 231 mg/dL — ABNORMAL HIGH (ref 70–99)
Glucose-Capillary: 262 mg/dL — ABNORMAL HIGH (ref 70–99)
Glucose-Capillary: 261 mg/dL — ABNORMAL HIGH (ref 70–99)
Glucose-Capillary: 275 mg/dL — ABNORMAL HIGH (ref 70–99)

## 2018-12-02 LAB — TRIGLYCERIDES: Triglycerides: 316 mg/dL — ABNORMAL HIGH (ref <150)

## 2018-12-02 MED ORDER — ACETYLCYSTEINE 20 % IN SOLN
4.0000 mL | Freq: Once | RESPIRATORY_TRACT | Status: AC
  Administered 2018-12-02: 4 mL via RESPIRATORY_TRACT
  Filled 2018-12-02 (×2): qty 4

## 2018-12-02 MED ORDER — FENTANYL CITRATE (PF) 100 MCG/2ML IJ SOLN
INTRAMUSCULAR | Status: AC
  Administered 2018-12-02: 200 ug via INTRAVENOUS
  Filled 2018-12-02: qty 4

## 2018-12-02 MED ORDER — FENTANYL 2500MCG IN NS 250ML (10MCG/ML) PREMIX INFUSION
INTRAVENOUS | Status: AC
  Filled 2018-12-02: qty 250

## 2018-12-02 MED ORDER — VECURONIUM BROMIDE 10 MG IV SOLR
10.0000 mg | Freq: Once | INTRAVENOUS | Status: AC
  Administered 2018-12-02: 09:00:00 10 mg via INTRAVENOUS

## 2018-12-02 MED ORDER — AMIODARONE IV BOLUS ONLY 150 MG/100ML
INTRAVENOUS | Status: AC
  Filled 2018-12-02: qty 100

## 2018-12-02 MED ORDER — MIDAZOLAM HCL 2 MG/2ML IJ SOLN
2.0000 mg | INTRAMUSCULAR | Status: DC | PRN
  Administered 2018-12-02: 20:00:00 2 mg via INTRAVENOUS
  Filled 2018-12-02: qty 2

## 2018-12-02 MED ORDER — AMIODARONE HCL IN DEXTROSE 360-4.14 MG/200ML-% IV SOLN
60.0000 mg/h | INTRAVENOUS | Status: DC
  Administered 2018-12-02 – 2018-12-03 (×5): 30 mg/h via INTRAVENOUS
  Administered 2018-12-04 – 2018-12-12 (×29): 60 mg/h via INTRAVENOUS
  Filled 2018-12-02 (×36): qty 200

## 2018-12-02 MED ORDER — AMIODARONE HCL IN DEXTROSE 360-4.14 MG/200ML-% IV SOLN
60.0000 mg/h | INTRAVENOUS | Status: AC
  Administered 2018-12-02 (×2): 60 mg/h via INTRAVENOUS

## 2018-12-02 MED ORDER — AMIODARONE IV BOLUS ONLY 150 MG/100ML
150.0000 mg | Freq: Once | INTRAVENOUS | Status: AC
  Administered 2018-12-02: 150 mg via INTRAVENOUS

## 2018-12-02 MED ORDER — PIPERACILLIN-TAZOBACTAM 3.375 G IVPB
3.3750 g | Freq: Three times a day (TID) | INTRAVENOUS | Status: DC
  Administered 2018-12-02 – 2018-12-03 (×4): 3.375 g via INTRAVENOUS
  Filled 2018-12-02 (×4): qty 50

## 2018-12-02 MED ORDER — INSULIN GLARGINE 100 UNIT/ML ~~LOC~~ SOLN
30.0000 [IU] | Freq: Every day | SUBCUTANEOUS | Status: DC
  Administered 2018-12-02 – 2018-12-03 (×2): 30 [IU] via SUBCUTANEOUS
  Filled 2018-12-02 (×3): qty 0.3

## 2018-12-02 MED ORDER — VECURONIUM BROMIDE 10 MG IV SOLR
INTRAVENOUS | Status: AC
  Administered 2018-12-02: 09:00:00 10 mg via INTRAVENOUS
  Filled 2018-12-02: qty 10

## 2018-12-02 MED ORDER — AMIODARONE IV BOLUS ONLY 150 MG/100ML
150.0000 mg | Freq: Once | INTRAVENOUS | Status: AC
  Administered 2018-12-02: 150 mg via INTRAVENOUS
  Filled 2018-12-02: qty 100

## 2018-12-02 MED ORDER — FENTANYL BOLUS VIA INFUSION
50.0000 ug | INTRAVENOUS | Status: DC | PRN
  Filled 2018-12-02: qty 50

## 2018-12-02 MED ORDER — AMIODARONE HCL IN DEXTROSE 360-4.14 MG/200ML-% IV SOLN
INTRAVENOUS | Status: AC
  Administered 2018-12-02: 10:00:00 60 mg/h via INTRAVENOUS
  Filled 2018-12-02: qty 200

## 2018-12-02 MED ORDER — FENTANYL CITRATE (PF) 100 MCG/2ML IJ SOLN
200.0000 ug | Freq: Once | INTRAMUSCULAR | Status: AC
  Administered 2018-12-02: 09:00:00 200 ug via INTRAVENOUS

## 2018-12-02 MED ORDER — PROPOFOL 1000 MG/100ML IV EMUL
5.0000 ug/kg/min | INTRAVENOUS | Status: DC
  Administered 2018-12-02 – 2018-12-04 (×5): 10 ug/kg/min via INTRAVENOUS
  Filled 2018-12-02 (×3): qty 100
  Filled 2018-12-02: qty 200
  Filled 2018-12-02: qty 100

## 2018-12-02 MED ORDER — FENTANYL 2500MCG IN NS 250ML (10MCG/ML) PREMIX INFUSION
0.0000 ug/h | INTRAVENOUS | Status: DC
  Administered 2018-12-02: 22:00:00 180 ug/h via INTRAVENOUS
  Administered 2018-12-02: 200 ug/h via INTRAVENOUS
  Administered 2018-12-03 – 2018-12-06 (×6): 150 ug/h via INTRAVENOUS
  Administered 2018-12-09: 04:00:00 25 ug/h via INTRAVENOUS
  Administered 2018-12-10 (×2): 200 ug/h via INTRAVENOUS
  Administered 2018-12-11: 100 ug/h via INTRAVENOUS
  Administered 2018-12-12: 150 ug/h via INTRAVENOUS
  Filled 2018-12-02 (×14): qty 250

## 2018-12-02 NOTE — Progress Notes (Signed)
PULMONARY/CCM PROGRESS NOTE  PT PROFILE: 58 y.o. M with hx of type 2 diabetes, chronic AF, pickwickian syndrome, hypertension, congestive heart failure adm 8/28 via ED to ICU/SDU with acute on chronic respiratory failure felt to be due to CHF/pulmonary edema  MAJOR EVENTS/TEST RESULTS: 08/28 admitted to ICU/SDU for BiPAP. PCCM consultation 08/28 emergently intubated 08/29 Echocardiogram: EF 55-60%.  LV cavity mildly dilated.  Diastolic function could not be evaluated.  Left atrium mildly dilated.  RV not assessed.  RA size not well visualized. 08/29 CTAP: No acute intra-abdominal process noted.  No perforation.  Trace ascites.  Severe hepatic steatosis.  Cholelithiasis.  Small R and trace L pleural effusions.  Bilateral lower lobe airspace disease may represent atelectasis but pneumonia is difficult to exclude, particularly on the R. 08/30 Extubated 08/30 Cardiology consultation: recommended diltiazem gtt for rapid AF and continue anticoagulation 08/31 reintubated emergently 08/31 bronchoscopy for mucous plugging and complete collapse of R lung 08/31 Palliative Care consultation to address goals of care 09/03 DNR established 09/05 Nephrology consultation for AKI 09/06 Renal US: Normal appearance of the kidneys 09/06 requiring 100% FiO2 and PEEP 15 cm H2O 09/07 ETT cuff leak, ETT changed 09/08 CTH: No acute intracranial abnormality seen 09/10 HD cath placed and intermittent HD initiated 09/11 + F/C. Improving vent requirements. 2 hrs of HD performed 09/12 improving vent requirements.  Low-dose clonazepam and Duragesic patch initiated.  Goals of care discussion with patient's sister and recommended tracheostomy tube. 09/13 Severe desaturation with increased purulent respiratory secretions and volume loss due to mucus plugging.  Respiratory culture obtained.  Antibiotics expanded to piperacillin-tazobactam.  DNR confirmed with patient's sister.  Continuous sedation infusions  initiated  INDWELLING DEVICES:: ETT 08/28 >> 08/30, 08/31 >> 09/07 (cuff leak), 09/07 >>  R IJ CVL 08/31 >>  L IJ HD cath 09/10 >>   MICRO DATA: SARS CoV 2 PCR 08/28 >> NEG MRSA PCR 08/28 >> NEG Urine 08/28 >> NEG Resp  08/28 >> abundant GPC chains Blood 08/29 >> NEG BAL 09/03 >> Staph simulans, strep mitis Blood 09/07 >> NEG RVP 09/09 >> NEG Resp 09/09 >> c/w NOF Resp 09/13 >>   ANTIMICROBIALS:  Cefepime 09/03 >> 09/08  Vancomycin 09/07 >> 09/10 Meropenem 09/09 >> 09/11 Ceftriaxone 09/11 >> 09/13 Pip-tazo 09/13 >>    SUBJ: Severe desaturation this morning with increased purulent respiratory secretions and volume loss on CXR due to mucus plugging.  Respiratory culture obtained.  Antibiotics expanded to piperacillin-tazobactam.  DNR confirmed with patient's sister.  Continuous sedation infusions initiated. RASS -4. + F/C.  Synchronous with ventilator  OBJ: Vitals:   12/02/18 0900 12/02/18 1000 12/02/18 1100 12/02/18 1200  BP: (!) 78/57 118/75 (!) 167/135 120/88  Pulse: (!) 109 69 (!) 134 96  Resp:  (!) 24 (!) 24 (!) 24  Temp:    98.3 F (36.8 C)  TempSrc:    Oral  SpO2: (!) 79% (!) 86% (!) 85% (!) 83%  Weight:      Height:       Vent Mode: PCV FiO2 (%):  [60 %-100 %] 100 % Set Rate:  [15 bmp] 15 bmp PEEP:  [10 cmH20] 10 cmH20 Plateau Pressure:  [23 cmH20-26 cmH20] 26 cmH20   Gen: Intubated, sedated, synchronous with vent.  RASS -4 HEENT: NCAT, sclerae white Neck: JVP cannot be visualized Lungs: Bilateral rhonchi Cardiovascular: Irregular, tachycardia, no M noted Abdomen: Severe centripetal obesity, abdomen soft, NT, + BS Ext: Warm, trace symmetric ankle edema Neuro: Minimally responsive.  No  focal deficits noted Skin: No lesions noted   BMP Latest Ref Rng & Units 12/02/2018 12/01/2018 11/30/2018  Glucose 70 - 99 mg/dL 252(H) 302(H) 241(H)  BUN 6 - 20 mg/dL 128(H) 111(H) 115(H)  Creatinine 0.61 - 1.24 mg/dL 3.27(H) 3.07(H) 3.11(H)  Sodium 135 - 145 mmol/L  147(H) 146(H) 147(H)  Potassium 3.5 - 5.1 mmol/L 4.3 4.0 4.2  Chloride 98 - 111 mmol/L 111 111 111  CO2 22 - 32 mmol/L _0 Calcium 8.9 - 10.3 mg/dL 8.3(L) 7.9(L) 7.7(L)    Hepatic Function Latest Ref Rng & Units 12/01/2018 11/25/2018 11/17/2018  Total Protein 6.5 - 8.1 g/dL 4.7(L) - 6.6  Albumin 3.5 - 5.0 g/dL 2.1(L) 2.6(L) 3.5  AST 15 - 41 U/L 226(H) - 20  ALT 0 - 44 U/L 609(H) - 25  Alk Phosphatase 38 - 126 U/L 135(H) - 64  Total Bilirubin 0.3 - 1.2 mg/dL 0.9 - 1.0    CBC Latest Ref Rng & Units 12/02/2018 12/01/2018 11/30/2018  WBC 4.0 - 10.5 K/uL 13.2(H) 9.1 8.9  Hemoglobin 13.0 - 17.0 g/dL 12.7(L) 12.4(L) 13.0  Hematocrit 39.0 - 52.0 % 41.6 39.3 40.4  Platelets 150 - 400 K/uL 61(L) 46(L) 45(L)    ABG    Component Value Date/Time   PHART 7.34 (L) 11/28/2018 0911   PCO2ART 33 11/28/2018 0911   PO2ART 67 (L) 11/28/2018 0911   HCO3 17.8 (L) 11/28/2018 0911   ACIDBASEDEF 7.0 (H) 11/28/2018 0911   O2SAT 91.7 11/28/2018 0911    CXR: Severe volume loss on R.  Cardiomegaly, increased edema pattern   IMPRESSION: Prolonged VDRF Pulmonary edema pattern on CXR Recurrent mucous plugging Bilateral pleural effusions ARDS Bacterial pneumonia  Purulent bronchitis Pickwickian syndrome Chronic atrial fibrillation AKI, nonoliguric Persistent mild hypernatremia Morbid obesity Type 2 diabetes with hyperglycemia Thrombocytopenia.  HIT platelet antibodies negative Acute encephalopathy, multifactorial ICU/ventilator associated discomfort  PLAN/REC: Cont full vent support - settings reviewed and adjusted Cont vent bundle Daily SBT if/when meets criteria Continue scheduled nebulized bronchodilators Continue ICU HD monitoring Continue scheduled enteral metoprolol Cont PRN IV metoprolol to maintain HR < 115/min Amiodarone infusion resumed 9/13 Monitor BMET intermittently Monitor I/Os Correct electrolytes as indicated Continue free water repletion - increased 9/12 Nephrology  following.  No HD planned today Continue SSI protocol, resistant scale Lantus initiated 9/12 and increased 9/13 Continue TF protocol Monitor temp, WBC count Micro and abx as above DVT px: SCDs Monitor CBC intermittently Transfuse per usual guidelines Continue scheduled clonazepam initiated 9/12 Continue Duragesic patch initiated 9/12 PAD protocol: Fentanyl infusion and intermittent midazolam. RASS goal -2, -3   Sister again updated at bedside.  I reported the events of the morning with significant deterioration.  I recommended that we put the request for tracheostomy tube "on hold".  He needs to demonstrate improvement and stability prior to proceeding with tracheostomy tube.  I confirmed DNR status.  Per our discussion, his comfort is our highest priority.  However, we will not discontinue any of our current therapies.    CCM time: 60 mins The above time includes time spent in consultation with patient and/or family members and reviewing care plan on multidisciplinary rounds  Merton Border, MD PCCM service Mobile [phone] Pager [phone] 12/02/2018 12:55 PM

## 2018-12-02 NOTE — Plan of Care (Signed)
  Problem: Health Behavior/Discharge Planning: Goal: Ability to manage health-related needs will improve Outcome: Not Progressing   Problem: Clinical Measurements: Goal: Ability to maintain clinical measurements within normal limits will improve Outcome: Not Progressing Goal: Will remain free from infection Outcome: Not Progressing Goal: Diagnostic test results will improve Outcome: Not Progressing Goal: Respiratory complications will improve Outcome: Not Progressing Goal: Cardiovascular complication will be avoided Outcome: Not Progressing   Problem: Activity: Goal: Risk for activity intolerance will decrease Outcome: Not Progressing   Problem: Coping: Goal: Level of anxiety will decrease Outcome: Not Progressing   Problem: Elimination: Goal: Will not experience complications related to bowel motility Outcome: Not Progressing Goal: Will not experience complications related to urinary retention Outcome: Not Progressing   Problem: Pain Managment: Goal: General experience of comfort will improve Outcome: Not Progressing

## 2018-12-02 NOTE — Progress Notes (Signed)
[facility]  ROUNDING NOTE   Subjective:   Placed on phenylephrine yesterday.  Having atrial fibrillation with rapid ventricular response.   UOP 1522m  Sister at bedside.   Objective:  Vital signs in last 24 hours:  Temp:  [98.7 F (37.1 C)-99.5 F (37.5 C)] 98.7 F (37.1 C) (09/13 0400) Pulse Rate:  [39-144] 81 (09/13 0600) Resp:  [15-31] 27 (09/13 0600) BP: (70-162)/(49-147) 138/126 (09/13 0600) SpO2:  [86 %-98 %] 91 % (09/13 0747) FiO2 (%):  [60 %-100 %] 100 % (09/13 0747) Weight:  [218.2 kg] 218.2 kg (09/13 0500)  Weight change:  Filed Weights   11/27/18 0456 11/28/18 0451 12/02/18 0500  Weight: (!) 213.6 kg (!) 216.4 kg (!) 218.2 kg    Intake/Output: I/O last 3 completed shifts: In: 3384.9 [I.V.:868.9; NG/GT:2316; IV [id number]:270]Out: 2185 [Urine:2185]   Intake/Output this shift:  Total I/O In: 10 [I.V.:10] Out: -   Physical Exam: General: Critically ill   Head: ETT  Eyes: Closed  Neck: Obese  Lungs:  Diminished bilaterally. Pressure Control FiO2 100%  Heart: Irregular, tachycardic  Abdomen:  obese  Extremities: + peripheral edema.  Neurologic: Intubated, sedated  Skin: No lesions  Access:  Left IJ temp HD catheter 9/10 ICU    Basic Metabolic Panel: Recent Labs  Lab 11/25/18 1250 11/26/18 0455  11/28/18 0151  11/28/18 2311 11/29/18 0504 11/30/18 0632 12/01/18 0419 12/02/18 0456  NA 145 145   < > 146*  --   --  147* 147* 146* 147*  K 4.1 4.1   < > 4.6   < > 3.9 4.3 4.2 4.0 4.3  CL 112* 115*   < > 116*  --   --  115* 111 111 111  CO2 20* 19*   < > 19*  --   --  _0 GLUCOSE 377* 420*   < > 405*  --   --  154* 241* 302* 252*  BUN 79* 81*   < > 98*  --   --  126* 115* 111* 128*  CREATININE 2.26* 2.25*   < > 3.05*  --   --  3.17* 3.11* 3.07* 3.27*  CALCIUM 8.5* 8.2*   < > 7.7*  --   --  7.7* 7.7* 7.9* 8.3*  MG  --  2.6*  --   --   --   --   --  2.8*  --   --   PHOS 3.5 4.2  --   --   --   --   --  7.1*  --   --    <  > = values in this interval not displayed.    Liver Function Tests: Recent Labs  Lab 11/25/18 1250 12/01/18 0419  AST  --  226*  ALT  --  609*  ALKPHOS  --  135*  BILITOT  --  0.9  PROT  --  4.7*  ALBUMIN 2.6* 2.1*   No results for input(s): LIPASE, AMYLASE in the last 168 hours. No results for input(s): AMMONIA in the last 168 hours.  CBC: Recent Labs  Lab 11/28/18 0151 11/29/18 0504 11/30/18 0632 12/01/18 0419 12/02/18 0456  WBC 13.3* 11.7* 8.9 9.1 13.2*  NEUTROABS  --   --  8.3*  --   --   HGB 12.5* 12.9* 13.0 12.4* 12.7*  HCT 41.5 41.2 40.4 39.3 41.6  MCV 99.8 95.6 93.7 94.9 97.9  PLT 72* 57* 45* 46* 61*  Cardiac Enzymes: No results for input(s): CKTOTAL, CKMB, CKMBINDEX, TROPONINI in the last 168 hours.  BNP: Invalid input(s): POCBNP  CBG: Recent Labs  Lab 12/01/18 1934 12/01/18 2350 12/02/18 0342 12/02/18 0752 12/02/18 1128  GLUCAP 262* 264* 233* 231* 80*    Microbiology: Results for orders placed or performed during the hospital encounter of 11/18/2018  SARS CORONAVIRUS 2 (TAT 6-12 HRS) Nasal Swab Aptima Multi Swab     Status: None   Collection Time: 10/31/2018  4:55 PM   Specimen: Aptima Multi Swab; Nasal Swab  Result Value Ref Range Status   SARS Coronavirus 2 NEGATIVE NEGATIVE Final    Comment: (NOTE) SARS-CoV-2 target nucleic acids are NOT DETECTED. The SARS-CoV-2 RNA is generally detectable in upper and lower respiratory specimens during the acute phase of infection. Negative results do not preclude SARS-CoV-2 infection, do not rule out co-infections with other pathogens, and should not be used as the sole basis for treatment or other patient management decisions. Negative results must be combined with clinical observations, patient history, and epidemiological information. The expected result is Negative. Fact Sheet for Patients: SugarRoll.be Fact Sheet for Healthcare  Providers: https://www.woods-mathews.com/ This test is not yet approved or cleared by the [place] FDA and  has been authorized for detection and/or diagnosis of SARS-CoV-2 by FDA under an Emergency Use Authorization (EUA). This EUA will remain  in effect (meaning this test can be used) for the duration of the COVID-19 declaration under Section 56 4(b)(1) of the Act, 21 U.S.C. section 360bbb-3(b)(1), unless the authorization is terminated or revoked sooner. Performed at [facility], [place] [address] [place], [place] [address]   SARS Coronavirus 2 [facility] order, Performed in [facility] hospital lab) Nasopharyngeal Nasopharyngeal Swab     Status: None   Collection Time: 10/30/2018 10:34 PM   Specimen: Nasopharyngeal Swab  Result Value Ref Range Status   SARS Coronavirus 2 NEGATIVE NEGATIVE Final    Comment: (NOTE) If result is NEGATIVE SARS-CoV-2 target nucleic acids are NOT DETECTED. The SARS-CoV-2 RNA is generally detectable in upper and lower  respiratory specimens during the acute phase of infection. The lowest  concentration of SARS-CoV-2 viral copies this assay can detect is 250  copies / mL. A negative result does not preclude SARS-CoV-2 infection  and should not be used as the sole basis for treatment or other  patient management decisions.  A negative result may occur with  improper specimen collection / handling, submission of specimen other  than nasopharyngeal swab, presence of viral mutation(s) within the  areas targeted by this assay, and inadequate number of viral copies  (<250 copies / mL). A negative result must be combined with clinical  observations, patient history, and epidemiological information. If result is POSITIVE SARS-CoV-2 target nucleic acids are DETECTED. The SARS-CoV-2 RNA is generally detectable in upper and lower  respiratory specimens dur ing the acute phase of infection.  Positive  results are indicative of active infection  with SARS-CoV-2.  Clinical  correlation with patient history and other diagnostic information is  necessary to determine patient infection status.  Positive results do  not rule out bacterial infection or co-infection with other viruses. If result is PRESUMPTIVE POSTIVE SARS-CoV-2 nucleic acids MAY BE PRESENT.   A presumptive positive result was obtained on the submitted specimen  and confirmed on repeat testing.  While 2019 novel coronavirus  (SARS-CoV-2) nucleic acids may be present in the submitted sample  additional confirmatory testing may be necessary for epidemiological  and /  or clinical management purposes  to differentiate between  SARS-CoV-2 and other Sarbecovirus currently known to infect humans.  If clinically indicated additional testing with an alternate test  methodology [phone]) is advised. The SARS-CoV-2 RNA is generally  detectable in upper and lower respiratory sp ecimens during the acute  phase of infection. The expected result is Negative. Fact Sheet for Patients:  StrictlyIdeas.no Fact Sheet for Healthcare Providers: BankingDealers.co.za This test is not yet approved or cleared by the [place] FDA and has been authorized for detection and/or diagnosis of SARS-CoV-2 by FDA under an Emergency Use Authorization (EUA).  This EUA will remain in effect (meaning this test can be used) for the duration of the COVID-19 declaration under Section 564(b)(1) of the Act, 21 U.S.C. section 360bbb-3(b)(1), unless the authorization is terminated or revoked sooner. Performed at [facility], [address] [place], [place] [address]   Culture, respiratory (non-expectorated)     Status: None   Collection Time: 11/05/2018 10:34 PM   Specimen: Tracheal Aspirate; Respiratory  Result Value Ref Range Status   Specimen Description   Final    TRACHEAL ASPIRATE Performed at [facility], [address]  [place], [place] [address]    Special Requests   Final    NONE Performed at [facility], [address] [place], [place] [address]    Gram Stain   Final    RARE WBC PRESENT, PREDOMINANTLY PMN ABUNDANT GRAM POSITIVE COCCI IN CHAINS    Culture   Final    Consistent with normal respiratory flora. Performed at [facility], [place] [address], [place], [place] [address]    Report Status 11/19/2018 FINAL  Final  Urine Culture     Status: None   Collection Time: 10/22/2018 11:12 PM   Specimen: Urine, Random  Result Value Ref Range Status   Specimen Description   Final    URINE, RANDOM Performed at [facility], [address], [place], [place] [address]    Special Requests   Final    NONE Performed at [facility], [address], [place], [place] [address]    Culture   Final    NO GROWTH Performed at [facility], [place] [address], [place], [place] [address]    Report Status 11/18/2018 FINAL  Final  MRSA PCR Screening     Status: None   Collection Time: 11/15/2018 11:28 PM   Specimen: Nasopharyngeal  Result Value Ref Range Status   MRSA by PCR NEGATIVE NEGATIVE Final    Comment:        The GeneXpert MRSA Assay (FDA approved for NASAL specimens only), is one component of a comprehensive MRSA colonization surveillance program. It is not intended to diagnose MRSA infection nor to guide or monitor treatment for MRSA infections. Performed at [facility], [address] [place],  [address]   Culture, blood (Routine X 2) w Reflex to ID Panel     Status: None   Collection Time: 11/17/18 12:19 AM   Specimen: BLOOD  Result Value Ref Range Status   Specimen Description BLOOD RIGHT ANTECUBITAL  Final   Special Requests   Final    BOTTLES DRAWN AEROBIC AND ANAEROBIC Blood Culture adequate volume   Culture   Final    NO GROWTH 5 DAYS Performed at [facility], [address], [place],  [address]    Report Status  11/22/2018 FINAL  Final  Culture, blood (Routine X 2) w Reflex  to ID Panel     Status: None   Collection Time: 11/17/18 12:31 AM   Specimen: BLOOD  Result Value Ref Range Status   Specimen Description BLOOD CENTRAL LINE MIDLINE  Final   Special Requests   Final    BOTTLES DRAWN AEROBIC AND ANAEROBIC Blood Culture adequate volume   Culture   Final    NO GROWTH 5 DAYS Performed at [facility], [address], [place], [place] [address]    Report Status 11/22/2018 FINAL  Final  Culture, bal-quantitative     Status: Abnormal   Collection Time: 11/22/18  7:48 AM   Specimen: Bronchoalveolar Lavage; Respiratory  Result Value Ref Range Status   Specimen Description   Final    BRONCHIAL ALVEOLAR LAVAGE Performed at [facility], [address] [place], [place] [address]    Special Requests   Final    Immunocompromised Performed at [facility], [place], [place] [address]    Gram Stain   Final    FEW WBC PRESENT, PREDOMINANTLY PMN FEW GRAM POSITIVE COCCI IN PAIRS IN CLUSTERS FEW GRAM POSITIVE RODS Performed at [facility], [place] [address], [place], [place] [address]    Culture (A)  Final    >=100,000 COLONIES/mL STAPHYLOCOCCUS SIMULANS >=100,000 COLONIES/mL STREPTOCOCCUS MITIS/ORALIS    Report Status 11/25/2018 FINAL  Final   Organism ID, Bacteria STAPHYLOCOCCUS SIMULANS (A)  Final   Organism ID, Bacteria STREPTOCOCCUS MITIS/ORALIS (A)  Final      Susceptibility   Staphylococcus simulans - MIC*    CIPROFLOXACIN <=0.5 SENSITIVE Sensitive     ERYTHROMYCIN <=0.25 SENSITIVE Sensitive     GENTAMICIN <=0.5 SENSITIVE Sensitive     OXACILLIN <=0.25 SENSITIVE Sensitive     TETRACYCLINE >=16 RESISTANT Resistant     VANCOMYCIN <=0.5 SENSITIVE Sensitive     TRIMETH/SULFA <=10 SENSITIVE Sensitive     CLINDAMYCIN <=0.25 SENSITIVE Sensitive     RIFAMPIN <=0.5 SENSITIVE Sensitive     Inducible Clindamycin NEGATIVE Sensitive     * >=100,000  COLONIES/mL STAPHYLOCOCCUS SIMULANS   Streptococcus mitis/oralis - MIC*    TETRACYCLINE >=16 RESISTANT Resistant     VANCOMYCIN 0.25 SENSITIVE Sensitive     CLINDAMYCIN 0.5 INTERMEDIATE Intermediate     * >=100,000 COLONIES/mL STREPTOCOCCUS MITIS/ORALIS  CULTURE, BLOOD (ROUTINE X 2) w Reflex to ID Panel     Status: None   Collection Time: 11/26/18 10:32 PM   Specimen: BLOOD  Result Value Ref Range Status   Specimen Description BLOOD RIGHT ANTECUBITAL  Final   Special Requests   Final    BOTTLES DRAWN AEROBIC AND ANAEROBIC Blood Culture adequate volume   Culture   Final    NO GROWTH 5 DAYS Performed at [facility], [address] [place], [place] [address]    Report Status 12/01/2018 FINAL  Final  CULTURE, BLOOD (ROUTINE X 2) w Reflex to ID Panel     Status: None   Collection Time: 11/26/18 10:32 PM   Specimen: BLOOD  Result Value Ref Range Status   Specimen Description BLOOD BLOOD RIGHT HAND  Final   Special Requests   Final    BOTTLES DRAWN AEROBIC AND ANAEROBIC Blood Culture adequate volume   Culture   Final    NO GROWTH 5 DAYS Performed at [facility], [address], [place], [place] [address]    Report Status 12/01/2018 FINAL  Final  Respiratory Panel by PCR     Status: None  Collection Time: 11/28/18 10:36 AM   Specimen: Nasopharyngeal Swab; Respiratory  Result Value Ref Range Status   Adenovirus NOT DETECTED NOT DETECTED Final   Coronavirus 229E NOT DETECTED NOT DETECTED Final    Comment: (NOTE) The Coronavirus on the Respiratory Panel, DOES NOT test for the novel  Coronavirus (2019 nCoV)    Coronavirus HKU1 NOT DETECTED NOT DETECTED Final   Coronavirus NL63 NOT DETECTED NOT DETECTED Final   Coronavirus OC43 NOT DETECTED NOT DETECTED Final   Metapneumovirus NOT DETECTED NOT DETECTED Final   Rhinovirus / Enterovirus NOT DETECTED NOT DETECTED Final   Influenza A NOT DETECTED NOT DETECTED Final   Influenza B NOT DETECTED NOT DETECTED Final    Parainfluenza Virus 1 NOT DETECTED NOT DETECTED Final   Parainfluenza Virus 2 NOT DETECTED NOT DETECTED Final   Parainfluenza Virus 3 NOT DETECTED NOT DETECTED Final   Parainfluenza Virus 4 NOT DETECTED NOT DETECTED Final   Respiratory Syncytial Virus NOT DETECTED NOT DETECTED Final   Bordetella pertussis NOT DETECTED NOT DETECTED Final   Chlamydophila pneumoniae NOT DETECTED NOT DETECTED Final   Mycoplasma pneumoniae NOT DETECTED NOT DETECTED Final    Comment: Performed at [facility], [address], [place], [place] [address]  Culture, respiratory (non-expectorated)     Status: None   Collection Time: 11/28/18 11:29 AM   Specimen: Tracheal Aspirate; Respiratory  Result Value Ref Range Status   Specimen Description   Final    TRACHEAL ASPIRATE Performed at [facility], [address] [place], [place] [address]    Special Requests   Final    NONE Performed at [facility], [address] [place], [place] [address]    Gram Stain   Final    FEW WBC PRESENT, PREDOMINANTLY PMN FEW SQUAMOUS EPITHELIAL CELLS PRESENT RARE GRAM POSITIVE COCCI IN PAIRS    Culture   Final    FEW Consistent with normal respiratory flora. Performed at [facility], [place] [address] [place], [place] [address]    Report Status 11/30/2018 FINAL  Final    Coagulation Studies: No results for input(s): LABPROT, INR in the last 72 hours.  Urinalysis: No results for input(s): COLORURINE, LABSPEC, PHURINE, GLUCOSEU, HGBUR, BILIRUBINUR, KETONESUR, PROTEINUR, UROBILINOGEN, NITRITE, LEUKOCYTESUR in the last 72 hours.  Invalid input(s): APPERANCEUR    Imaging: Dg Abd 1 View  Result Date: 11/30/2018 CLINICAL DATA:  OG tube placement. EXAM: ABDOMEN - 1 VIEW COMPARISON:  11/26/2018 FINDINGS: 1329 hours. Lower left chest and upper left abdomen have been included in the field of view. NG tube tip is identified in the mid stomach. Cardiopericardial silhouette appears enlarged with  left base retrocardiac collapse/consolidation. IMPRESSION: OG tube tip is in the mid stomach. Electronically Signed   By: Misty Stanley M.D.   On: 11/30/2018 15:21   Dg Chest Port 1 View  Result Date: 12/02/2018 CLINICAL DATA:  Acute respiratory failure. EXAM: PORTABLE CHEST 1 VIEW COMPARISON:  12/01/2018 FINDINGS: [name] rotated to the right. Enteric tube courses into the stomach as tip is not definitely visualized. Endotracheal tube has tip approximately 7 cm above the carina. Left IJ central venous catheter unchanged as well as right IJ central venous catheter unchanged. Exam demonstrates worsening bilateral perihilar/bibasilar opacification. Findings likely due to layering bilateral effusions with bibasilar atelectasis as well as interstitial edema. Infection is possible. Cardiomegaly. Remainder of the exam is unchanged. IMPRESSION: Worsening bilateral perihilar bibasilar opacification likely worsening effusions and CHF. Infection is possible. Tubes and  lines as described. Electronically Signed   By: Marin Olp M.D.   On: 12/02/2018 09:13   Dg Chest Port 1 View  Result Date: 12/01/2018 CLINICAL DATA:  Respiratory failure EXAM: PORTABLE CHEST 1 VIEW COMPARISON:  Numerous prior radiographs, most recently 11/30/2010 FINDINGS: Endotracheal tube is positioned within the upper trachea approximately 6 cm from the carina. Distal extent of the transesophageal tube is poorly visualized due to radiographic underpenetration. Left IJ approach central venous catheter tip terminates in the brachiocephalic vein. Right IJ catheter tip terminates near the superior cavoatrial junction. Redemonstration the bilateral pleural effusions which obscure the hemidiaphragms and resulting gradient basilar opacity. Bilateral airspace disease is grossly similar to comparison study accounting for differences in technique. There is redemonstration of the metallic cerclage wire associated with the right ribs. IMPRESSION: 1.  Endotracheal tube tip approximately 6 cm from the carina. 2. Distal extent of the transesophageal tube is poorly visualized due to radiographic underpenetration. 3. Left IJ catheter terminates in the brachiocephalic vein. Right IJ catheter at the superior cavoatrial junction. 4. Grossly stable bilateral pleural effusions and bilateral airspace disease. Electronically Signed   By: Lovena Le M.D.   On: 12/01/2018 03:24     Medications:   . sodium chloride 200 mL (12/02/18 0141)  . amiodarone 60 mg/hr (12/02/18 0957)  . amiodarone    . amiodarone    . feeding supplement (VITAL HIGH PROTEIN) Stopped (12/02/18 0815)  . fentaNYL    . fentaNYL infusion INTRAVENOUS 200 mcg/hr (12/02/18 3500)  . phenylephrine (NEO-SYNEPHRINE) Adult infusion 30 mcg/min (12/02/18 0909)  . piperacillin-tazobactam (ZOSYN)  IV     . aspirin  81 mg Per NG tube Daily  . chlorhexidine gluconate (MEDLINE KIT)  15 mL Mouth Rinse BID  . Chlorhexidine Gluconate Cloth  6 each Topical Daily  . clonazePAM  1 mg Per Tube Daily  . clonazePAM  2 mg Per Tube QHS  . feeding supplement (PRO-STAT SUGAR FREE 64)  60 mL Per Tube TID  . fentaNYL  1 patch Transdermal Q72H  . free water  300 mL Per Tube Q4H  . insulin aspart  0-20 Units Subcutaneous Q4H  . insulin glargine  30 Units Subcutaneous Daily  . ipratropium-albuterol  3 mL Nebulization Q6H  . mouth rinse  15 mL Mouth Rinse 10 times per day  . multivitamin  15 mL Per Tube Daily  . pantoprazole sodium  40 mg Per Tube QHS  . polyethylene glycol  17 g Per Tube BID  . senna-docusate  2 tablet Per Tube BID  . sodium chloride flush  10-40 mL Intracatheter Q12H   sodium chloride, acetaminophen, bisacodyl, fentaNYL, metoprolol tartrate, midazolam, ondansetron (ZOFRAN) IV, sodium chloride flush  Assessment/ Plan:  Mr. Kyle White is a 58 y.o. white male with atrial fibrillation on warfarin, congestive heart failure, diabetes mellitus type 2, obstructive sleep apnea, morbid  obesity, gout, mitral valve replacement who was admitted to [facility] on 10/27/2018 for acute exacerbation of COPD.  1.  Acute renal failure with metabolic acidosis on chronic kidney disease stage III: creatinine on admission of 1.31, GFR of 53.   Baseline creatinine appears to be 1.3 with an EGFR 57.  Urinalysis with proteinuria. Acute renal failure secondary to acute cardiorenal syndrome.  Nonoliguric urine output.  - With uremia and concern for uremic encephalopathy. Completed two dialysis treatment. Hold dialysis today as urine output has improved. Monitor daily for dialysis need.   2.  Acute respiratory failure: requiring mechanical ventilation. With pneumonia. Pickwickian  syndrome - zosyn.  - Appreciate critical care input  3. Hypotension: requiring vasopressors. Restarted amiodarone.  - vasopressors: phenylephrine  4. Diastolic congestive heart failure: acute exacerbation.     LOS: [place] 9/13/202011:42 AM

## 2018-12-02 NOTE — Progress Notes (Addendum)
Shift summary:  - HR labile upon first assessment. - Cuff leak upon exam, NP notified and CXR ordered. - ETT advanced and tube holder exchanged; patient tolerated well. - Propofol infusion added this shift to maintain vent compliance. - Upon exam, patient does not appear to be fecally impacted, although the patient has not stooled this admission. Loose stool present in rectum.

## 2018-12-02 NOTE — Progress Notes (Signed)
[facility] [place] NAME: Kyle White    MR#:  [id number]  DATE OF BIRTH:  November 18, 1960  SUBJECTIVE:  Pt is afebrile,remains intubated on the ventilator, severely hypoxemic with underlying pickwickian syndrome,unsuccessful weaning trials.  Critically ill, uremic, started hemodialysis--issues with catheter clotting  underwent bronchoscopy 11/22/18.  Palliative care team is involved Sister [name] is following  patient now on IV pressers REVIEW OF SYSTEMS:   Review of Systems  Unable to perform ROS: Intubated  Psychiatric/Behavioral: Nervous/anxious:      DRUG ALLERGIES:   No Active Allergies  VITALS:  Blood pressure 120/88, pulse 96, temperature 98.7 F (37.1 C), temperature source Axillary, resp. rate (!) 24, height 5' 9.02" (1.753 m), weight (!) 218.2 kg, SpO2 (!) 83 %.  PHYSICAL EXAMINATION:   Physical Exam  GENERAL:  58 y.o.-year-old patient lying in the bed with no acute distress. Morbidly obese critically ill intubated on the vent EYES: Pupils equal, round, reactive to light and accommodation. No scleral icterus. Extraocular muscles intact.  HEENT: Head atraumatic, normocephalic. Oropharynx and nasopharynx clear.  NECK:  Supple, no jugular venous distention. No thyroid enlargement, no tenderness.  LUNGS: Mod breath sounds bilaterally, positive wheezing, rales, rhonchi. No use of accessory muscles of respiration.  CARDIOVASCULAR: S1, S2 normal. No murmurs, rubs, or gallops.  ABDOMEN: Soft, nontender, nondistended. Bowel sounds present. No organomegaly or mass.  EXTREMITIES: No cyanosis, clubbing or edema b/l.    NEUROLOGIC: sedated GCS less than 8 PSYCHIATRIC: sedated  And on the vent SKIN: No obvious rash, lesion, or ulcer.   LABORATORY PANEL:  CBC Recent Labs  Lab 12/02/18 0456  WBC 13.2*  HGB 12.7*  HCT 41.6  PLT 61*    Chemistries  Recent Labs  Lab 11/30/18 0632 12/01/18 0419 12/02/18 0456  NA 147* 146*  147*  K 4.2 4.0 4.3  CL 111 111 111  CO2 23 25 26   GLUCOSE 241* 302* 252*  BUN 115* 111* 128*  CREATININE 3.11* 3.07* 3.27*  CALCIUM 7.7* 7.9* 8.3*  MG 2.8*  --   --   AST  --  226*  --   ALT  --  609*  --   ALKPHOS  --  135*  --   BILITOT  --  0.9  --    Cardiac Enzymes No results for input(s): TROPONINI in the last 168 hours. RADIOLOGY:  Dg Abd 1 View  Result Date: 11/30/2018 CLINICAL DATA:  OG tube placement. EXAM: ABDOMEN - 1 VIEW COMPARISON:  11/26/2018 FINDINGS: 1329 hours. Lower left chest and upper left abdomen have been included in the field of view. NG tube tip is identified in the mid stomach. Cardiopericardial silhouette appears enlarged with left base retrocardiac collapse/consolidation. IMPRESSION: OG tube tip is in the mid stomach. Electronically Signed   By: Misty Stanley M.D.   On: 11/30/2018 15:21   Dg Chest Port 1 View  Result Date: 12/02/2018 CLINICAL DATA:  Acute respiratory failure. EXAM: PORTABLE CHEST 1 VIEW COMPARISON:  12/01/2018 FINDINGS: [name] rotated to the right. Enteric tube courses into the stomach as tip is not definitely visualized. Endotracheal tube has tip approximately 7 cm above the carina. Left IJ central venous catheter unchanged as well as right IJ central venous catheter unchanged. Exam demonstrates worsening bilateral perihilar/bibasilar opacification. Findings likely due to layering bilateral effusions with bibasilar atelectasis as well as interstitial edema. Infection is possible. Cardiomegaly. Remainder of the exam is unchanged. IMPRESSION: Worsening bilateral perihilar bibasilar opacification  likely worsening effusions and CHF. Infection is possible. Tubes and lines as described. Electronically Signed   By: Marin Olp M.D.   On: 12/02/2018 09:13   Dg Chest Port 1 View  Result Date: 12/01/2018 CLINICAL DATA:  Respiratory failure EXAM: PORTABLE CHEST 1 VIEW COMPARISON:  Numerous prior radiographs, most recently 11/30/2010 FINDINGS:  Endotracheal tube is positioned within the upper trachea approximately 6 cm from the carina. Distal extent of the transesophageal tube is poorly visualized due to radiographic underpenetration. Left IJ approach central venous catheter tip terminates in the brachiocephalic vein. Right IJ catheter tip terminates near the superior cavoatrial junction. Redemonstration the bilateral pleural effusions which obscure the hemidiaphragms and resulting gradient basilar opacity. Bilateral airspace disease is grossly similar to comparison study accounting for differences in technique. There is redemonstration of the metallic cerclage wire associated with the right ribs. IMPRESSION: 1. Endotracheal tube tip approximately 6 cm from the carina. 2. Distal extent of the transesophageal tube is poorly visualized due to radiographic underpenetration. 3. Left IJ catheter terminates in the brachiocephalic vein. Right IJ catheter at the superior cavoatrial junction. 4. Grossly stable bilateral pleural effusions and bilateral airspace disease. Electronically Signed   By: Lovena Le M.D.   On: 12/01/2018 03:24   ASSESSMENT AND PLAN:  Kyle White  is a 58 y.o. male with a known history of atrial fibrillation on Coumadin, CHF, diabetes mellitus, and obstructive sleep apnea with CPAP use at home, morbid obesity with sedentary lifestyle on oxygen at 2 L/min at home.  1.Acute on chronic  respiratory failure with severe hypoxia-ARDS with pneumonia  underlying pickwickian syndrome -No clinical improvement, unsuccessful weaning trials -Extubated on 8/31 but required reintubation, vent management per PCCM team.   -patient underwent bronchoscopy 11/22/2018. 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 -vent support --- extremely poor prognosis with high mortality and morbidity -Continue tube feeds with pro-stat -Antibiotics have been changed from meropenem, vancomycin and IV steroids to Rocephin  -Septic not present on admission from  pneumonia with ARDS Continue IV antibiotics Goal is to keep map greater than or equal to 65 IV steroids -IV phenylephrine gtt  #.Obstructive sleep apnea-- chronic  #Acute on chronic diastolic CHF, EF 55 to [id number] on echo in 2014 at [facility] -Repeat echocardiogram shows EF of 55 to 60%  # History of atrial fibrillation -Coumadin on hold  -Heparin drip drip discontinued in view of thrombocytopenia   #. Acute renal failure on chronic kidney disease stage III  with baseline creatinine 1.28-1.89-1.87-2.25-2.21-3.05--3.17-3.11 -Repeat BMP in the a.m. and continue to monitor renal function closely -Patient being uremic nephrology started the patient on hemodialysis  #  Type IIDiabetes mellitus -Sliding scale insulin -Hemoglobin A1c 7.3    Overall poor prognosis. Appreciate palliative care input.   CODE STATUS: DNR  DVT Prophylaxis: scd as patient is thrombocytopenic  TOTAL TIME TAKING CARE OF THIS PATIENT:25 minutes.  >50% time spent on counselling and coordination of care  POSSIBLE D/C IN *?* DAYS, DEPENDING ON CLINICAL CONDITION.  Note: This dictation was prepared with Dragon dictation along with smaller phrase technology. Any transcriptional errors that result from this process are unintentional.   Fritzi Mandes M.D on 12/02/2018 at 12:36 PM  Between 7am to 6pm - Pager - [phone]  After 6pm go to [email or web address] - password [email or web address] [facility]  Office  [phone]  CC: Primary care physician; Inc, [employer] ServicesPatient ID: Kyle White, male   DOB: [birth year], 58 y.o.   MRN: [id number]

## 2018-12-02 NOTE — Consult Note (Signed)
Pharmacy Antibiotic Note  Kyle White is a 58 y.o. male admitted on 11/04/2018 with pneumonia. 9/3 BAL significant for Staph simulans and Step mitis/oralis. Patient remains intubated. WBC starting to trend up, patient remains afebrile. Pharmacy consulted for Zosyn dosing for possible aspiration pneumonia.   Plan: Patient being evaluated for dialysis daily; no dialysis 9/13. [name] creatinine clearance is currently 55m/min. Will initiate Zosyn EI 3.375g IV Q8hr.    Height: 5' 9.02" (175.3 cm) Weight: (!) 481 lb (218.2 kg) IBW/kg (Calculated) : 70.74  Temp (24hrs), Avg:98.7 F (37.1 C), Min:97.5 F (36.4 C), Max:99.5 F (37.5 C)  Recent Labs  Lab 11/28/18 0151 11/29/18 0504 11/29/18 1623 11/30/18 0632 12/01/18 0419 12/02/18 0456  WBC 13.3* 11.7*  --  8.9 9.1 13.2*  CREATININE 3.05* 3.17*  --  3.11* 3.07* 3.27*  VANCORANDOM  --   --  28 22  --   --     Estimated Creatinine Clearance: 44.6 mL/min (A) (by C-G formula based on SCr of 3.27 mg/dL (H)).    No Active Allergies  Antimicrobials this admission: Cefepime 9/3 >> 9/8 Vancomycin 9/7 >> 9/10 Meropenem 9/9 >> 9/10 Ceftriaxone 9/11 >> 9/12 Zosyn 9/13 >>   Dose adjustments this admission:  9/9: Vancomycin decreased from 1750 mg Q24H to 1250 mg Q24H due to serum creatinine bump  9/10: Meropenem decreased from 1g Q12H to 500 mg Q24H due to patient starting intermittent HD. Vancomycin transitioned to Dialysis dosing.   Microbiology results: 9/13 respiratory culture; pending  9/9 respiratory culture: normal flora  9/9 respiratory panel: none detected 9/7 Bcx: no growth  9/3 BAL: Staph simulans & Strep mitis/oralis, vancomycin susceptible 8/29 Bcx: No growth 8/28 Ucx: No growth 8/28 MRSA PCR: negative  Thank you for allowing pharmacy to be a part of this patient's care.  Simpson,Michael L, pharmacy student 12/02/2018 11:35 AM

## 2018-12-03 ENCOUNTER — Inpatient Hospital Stay: Payer: Medicare Other

## 2018-12-03 DIAGNOSIS — J441 Chronic obstructive pulmonary disease with (acute) exacerbation: Secondary | ICD-10-CM

## 2018-12-03 LAB — CBC WITH DIFFERENTIAL/PLATELET
Abs Immature Granulocytes: 0.08 10*3/uL — ABNORMAL HIGH (ref 0.00–0.07)
Basophils Absolute: 0 10*3/uL (ref 0.0–0.1)
Basophils Relative: 0 %
Eosinophils Absolute: 0.2 10*3/uL (ref 0.0–0.5)
Eosinophils Relative: 2 %
HCT: 32.3 % — ABNORMAL LOW (ref 39.0–52.0)
Hemoglobin: 10.6 g/dL — ABNORMAL LOW (ref 13.0–17.0)
Immature Granulocytes: 1 %
Lymphocytes Relative: 9 %
Lymphs Abs: 0.7 10*3/uL (ref 0.7–4.0)
MCH: 30.6 pg (ref 26.0–34.0)
MCHC: 32.8 g/dL (ref 30.0–36.0)
MCV: 93.4 fL (ref 80.0–100.0)
Monocytes Absolute: 0.2 10*3/uL (ref 0.1–1.0)
Monocytes Relative: 2 %
Neutro Abs: 7.2 10*3/uL (ref 1.7–7.7)
Neutrophils Relative %: 86 %
Platelets: 57 10*3/uL — ABNORMAL LOW (ref 150–400)
RBC: 3.46 MIL/uL — ABNORMAL LOW (ref 4.22–5.81)
RDW: 13.6 % (ref 11.5–15.5)
Smear Review: DECREASED
WBC: 8.4 10*3/uL (ref 4.0–10.5)
nRBC: 3.9 % — ABNORMAL HIGH (ref 0.0–0.2)

## 2018-12-03 LAB — COMPREHENSIVE METABOLIC PANEL
ALT: 223 U/L — ABNORMAL HIGH (ref 0–44)
AST: 35 U/L (ref 15–41)
Albumin: 1.8 g/dL — ABNORMAL LOW (ref 3.5–5.0)
Alkaline Phosphatase: 114 U/L (ref 38–126)
Anion gap: 15 (ref 5–15)
BUN: 148 mg/dL — ABNORMAL HIGH (ref 6–20)
CO2: 22 mmol/L (ref 22–32)
Calcium: 8.1 mg/dL — ABNORMAL LOW (ref 8.9–10.3)
Chloride: 110 mmol/L (ref 98–111)
Creatinine, Ser: 3.53 mg/dL — ABNORMAL HIGH (ref 0.61–1.24)
GFR calc Af Amer: 21 mL/min — ABNORMAL LOW (ref >60)
GFR calc non Af Amer: 18 mL/min — ABNORMAL LOW (ref >60)
Glucose, Bld: 292 mg/dL — ABNORMAL HIGH (ref 70–99)
Potassium: 3.5 mmol/L (ref 3.5–5.1)
Sodium: 147 mmol/L — ABNORMAL HIGH (ref 135–145)
Total Bilirubin: 0.8 mg/dL (ref 0.3–1.2)
Total Protein: 4.4 g/dL — ABNORMAL LOW (ref 6.5–8.1)

## 2018-12-03 LAB — BLOOD GAS, ARTERIAL
Acid-Base Excess: 0.3 mmol/L (ref 0.0–2.0)
Bicarbonate: 21.7 mmol/L (ref 20.0–28.0)
FIO2: 1
Mechanical Rate: 24
O2 Saturation: 99.9 %
PEEP: 10 cmH2O
Patient temperature: 37
Pressure control: 25 cmH2O
pCO2 arterial: 26 mmHg — ABNORMAL LOW (ref 32.0–48.0)
pH, Arterial: 7.53 — ABNORMAL HIGH (ref 7.350–7.450)
pO2, Arterial: 297 mmHg — ABNORMAL HIGH (ref 83.0–108.0)

## 2018-12-03 LAB — GLUCOSE, CAPILLARY
Glucose-Capillary: 167 mg/dL — ABNORMAL HIGH (ref 70–99)
Glucose-Capillary: 181 mg/dL — ABNORMAL HIGH (ref 70–99)
Glucose-Capillary: 195 mg/dL — ABNORMAL HIGH (ref 70–99)
Glucose-Capillary: 249 mg/dL — ABNORMAL HIGH (ref 70–99)
Glucose-Capillary: 260 mg/dL — ABNORMAL HIGH (ref 70–99)
Glucose-Capillary: 275 mg/dL — ABNORMAL HIGH (ref 70–99)
Glucose-Capillary: 301 mg/dL — ABNORMAL HIGH (ref 70–99)

## 2018-12-03 LAB — PATHOLOGIST SMEAR REVIEW

## 2018-12-03 MED ORDER — FENTANYL CITRATE (PF) 100 MCG/2ML IJ SOLN
INTRAMUSCULAR | Status: AC
  Administered 2018-12-03: 100 ug via INTRAVENOUS
  Filled 2018-12-03: qty 2

## 2018-12-03 MED ORDER — ROCURONIUM BROMIDE 50 MG/5ML IV SOLN
INTRAVENOUS | Status: AC
  Filled 2018-12-03: qty 1

## 2018-12-03 MED ORDER — ALTEPLASE 2 MG IJ SOLR
2.0000 mg | Freq: Once | INTRAMUSCULAR | Status: AC
  Administered 2018-12-03: 2 mg

## 2018-12-03 MED ORDER — SENNOSIDES-DOCUSATE SODIUM 8.6-50 MG PO TABS
2.0000 | ORAL_TABLET | Freq: Three times a day (TID) | ORAL | Status: DC
  Administered 2018-12-03 – 2018-12-06 (×7): 2
  Filled 2018-12-03 (×7): qty 2

## 2018-12-03 MED ORDER — LACTULOSE 10 GM/15ML PO SOLN
30.0000 g | Freq: Three times a day (TID) | ORAL | Status: DC
  Administered 2018-12-03 – 2018-12-04 (×4): 30 g
  Filled 2018-12-03 (×5): qty 60

## 2018-12-03 MED ORDER — ETOMIDATE 2 MG/ML IV SOLN
INTRAVENOUS | Status: AC
  Administered 2018-12-03: 20:00:00 20 mg via INTRAVENOUS
  Filled 2018-12-03: qty 10

## 2018-12-03 MED ORDER — VANCOMYCIN VARIABLE DOSE PER UNSTABLE RENAL FUNCTION (PHARMACIST DOSING): Status: DC

## 2018-12-03 MED ORDER — INSULIN GLARGINE 100 UNIT/ML ~~LOC~~ SOLN
10.0000 [IU] | Freq: Once | SUBCUTANEOUS | Status: AC
  Administered 2018-12-03: 10 [IU] via SUBCUTANEOUS
  Filled 2018-12-03: qty 0.1

## 2018-12-03 MED ORDER — ROCURONIUM BROMIDE 50 MG/5ML IV SOLN
50.0000 mg | Freq: Once | INTRAVENOUS | Status: AC
  Administered 2018-12-03: 20:00:00 50 mg via INTRAVENOUS

## 2018-12-03 MED ORDER — FENTANYL CITRATE (PF) 100 MCG/2ML IJ SOLN
100.0000 ug | Freq: Once | INTRAMUSCULAR | Status: AC
  Administered 2018-12-03: 20:00:00 100 ug via INTRAVENOUS

## 2018-12-03 MED ORDER — INSULIN ASPART 100 UNIT/ML ~~LOC~~ SOLN
2.0000 [IU] | SUBCUTANEOUS | Status: DC
  Administered 2018-12-03 – 2018-12-05 (×10): 2 [IU] via SUBCUTANEOUS
  Filled 2018-12-03 (×11): qty 1

## 2018-12-03 MED ORDER — VANCOMYCIN HCL 10 G IV SOLR
2000.0000 mg | Freq: Once | INTRAVENOUS | Status: AC
  Administered 2018-12-03: 12:00:00 2000 mg via INTRAVENOUS
  Filled 2018-12-03: qty 2000

## 2018-12-03 MED ORDER — INSULIN GLARGINE 100 UNIT/ML ~~LOC~~ SOLN
40.0000 [IU] | Freq: Every day | SUBCUTANEOUS | Status: DC
  Administered 2018-12-04 – 2018-12-12 (×9): 40 [IU] via SUBCUTANEOUS
  Filled 2018-12-03 (×9): qty 0.4

## 2018-12-03 MED ORDER — ETOMIDATE 2 MG/ML IV SOLN
20.0000 mg | Freq: Once | INTRAVENOUS | Status: AC
  Administered 2018-12-03: 20:00:00 20 mg via INTRAVENOUS

## 2018-12-03 MED ORDER — SODIUM CHLORIDE 0.9 % IV SOLN
0.0000 ug/min | INTRAVENOUS | Status: DC
  Administered 2018-12-03 – 2018-12-05 (×2): 20 ug/min via INTRAVENOUS
  Administered 2018-12-06: 30 ug/min via INTRAVENOUS
  Administered 2018-12-06: 26 ug/min via INTRAVENOUS
  Filled 2018-12-03 (×2): qty 4
  Filled 2018-12-03 (×3): qty 40
  Filled 2018-12-03: qty 4

## 2018-12-03 NOTE — Progress Notes (Signed)
This note also relates to the following rows which could not be included: Pulse Rate - Cannot attach notes to unvalidated device data Resp - Cannot attach notes to unvalidated device data SpO2 - Cannot attach notes to unvalidated device data End Tidal CO2 (EtCO2) - Cannot attach notes to unvalidated device data    12/03/18 1505  During Hemodialysis Assessment  Blood Flow Rate (mL/min) 200 mL/min  Arterial Pressure (mmHg) -200 mmHg  Venous Pressure (mmHg) 90 mmHg  Transmembrane Pressure (mmHg) 50 mmHg  Ultrafiltration Rate (mL/min) 670 mL/min  Dialysate Flow Rate (mL/min) 600 ml/min  Conductivity: Machine  14.2  HD Safety Checks Performed Yes  Dialysis Fluid Bolus Normal Saline  Bolus Amount (mL) 250 mL  Intra-Hemodialysis Comments Tx initiated  Tx initiated pt stable VS stable UFG set 2078mL BFR being titrated to RX goal due to CVC pressures high

## 2018-12-03 NOTE — Consult Note (Signed)
Pharmacy Antibiotic Note  Kyle White is a 58 y.o. male admitted on 10/28/2018 with pneumonia. 9/3 BAL significant for Staph simulans and Strep mitis/oralis. Patient had received approximately 4 days of vancomycin therapy this admission (last dose 9/9). Meropenem was started on 9/9 for persistent fevers, concern for pseudomonas, and head CT significant for sinusitis. Meropenem and vancomycin were d/c 9/10 and ceftriaxone monotherapy was started. Ceftriaxone was then changed to Zosyn on 9/13 after concern for possible aspiration pneumonia. Per discussion on rounds plan is to discontinue Zosyn and re-start vancomycin as duration of therapy was determined to be inadequate. Pharmacy consulted for vancomycin dosing for Staph simulans and Strep mitis/oralis pneumonia. Tentative plan is to treat for 4-5 days.   Patient remains intubated. WBC are trending down, patient remains afebrile. CXR significant for stable bibasilar opacities with small pleural effusions. Patient also with acute renal failure secondary to acute receiving intermittent HD for uremia. Per nephrology, patient to receive dialysis today.   Plan: Give vancomycin LD of 2 g x1 followed by a maintenance regimen of    1 g given with dialysis. Plan to not give dose with dialysis today as patient received loading dose at noon today.    Will continue follow-up on plan and need for therapy. Plan to obtain level prior to dialysis before administration of the third maintenance dose. Continue to follow dialysis plan and adjust therapy as indicated based on renal function.    Height: 5' 9.02" (175.3 cm) Weight: (!) 493 lb (223.6 kg) IBW/kg (Calculated) : 70.74  Temp (24hrs), Avg:98.7 F (37.1 C), Min:98.1 F (36.7 C), Max:99.5 F (37.5 C)  Recent Labs  Lab 11/29/18 0504 11/29/18 1623 11/30/18 0632 12/01/18 0419 12/02/18 0456 12/03/18 0435  WBC 11.7*  --  8.9 9.1 13.2* 8.4  CREATININE 3.17*  --  3.11* 3.07* 3.27* 3.53*  VANCORANDOM  --   28 22  --   --   --     Estimated Creatinine Clearance: 42 mL/min (A) (by C-G formula based on SCr of 3.53 mg/dL (H)).    No Active Allergies  Antimicrobials this admission: Cefepime 9/3 >> 9/8 Vancomycin 9/7 >> 9/10, 9/14 >> Meropenem 9/9 >> 9/10 Ceftriaxone 9/11 >> 9/12 Zosyn 9/13 >> 9/14  Dose adjustments this admission:  9/9: Vancomycin decreased from 1750 mg Q24H to 1250 mg Q24H due to serum creatinine bump  9/10: Meropenem decreased from 1g Q12H to 500 mg Q24H due to patient starting intermittent HD. Vancomycin transitioned to Dialysis dosing.   Microbiology results: 9/13 tracheal aspirate: no organisms seen, reincubated for better growth 9/9 respiratory culture: rare GPC in pairs 9/9 respiratory panel: none detected 9/7 Bcx: no growth  9/3 BAL: Staph simulans & Strep mitis/oralis, vancomycin susceptible 8/29 Bcx: No growth 8/28 Ucx: No growth 8/28 MRSA PCR: negative  Thank you for allowing pharmacy to be a part of this patient's care.  [facility] Resident 12/03/2018 11:34 AM

## 2018-12-03 NOTE — Progress Notes (Signed)
CATH-FLOW USED PRE-TREATMENT , CATHETER BLOOD PUSHED AND PULL ON  ARTERIAL,  NO BLOOD RETURN ON VENOUS BUT WAS ABLE TO GET CATH-FLOW OUT AND CATHETER WAS FLUSHED, INITIATED WITHOUT INTERRUPTION, 30 MINUTES WITHIN TREATMENT CATHETER STOPPED WORKING ON HD MACHINE, PRIMARY NURSE AND DR. [facility] INFORMED   12/03/18 1800  Vital Signs  Temp 98.9 F (37.2 C)  Temp Source Rectal  Pulse Rate 92  Resp (!) 24  BP (!) 98/53  Oxygen Therapy  SpO2 (!) 74 %  End Tidal CO2 (EtCO2) 33  Pain Assessment  Pain Scale CPOT  Critical Care Pain Observation Tool (CPOT)  Facial Expression 0  Body Movements 0  Muscle Tension 0  Compliance with ventilator (intubated pts.) 0  Vocalization (extubated pts.) N/A  CPOT Total 0  During Hemodialysis Assessment  Blood Flow Rate (mL/min) 150 mL/min  Arterial Pressure (mmHg) 20 mmHg  Venous Pressure (mmHg) 90 mmHg  Transmembrane Pressure (mmHg) 60 mmHg  Ultrafiltration Rate (mL/min) 0 mL/min  Dialysate Flow Rate (mL/min) 600 ml/min  Conductivity: Machine  15.5  HD Safety Checks Performed Yes  Dialysis Fluid Bolus Normal Saline  Bolus Amount (mL) 250 mL  Intra-Hemodialysis Comments Tx completed  Post-Hemodialysis Assessment  Rinseback Volume (mL) 250 mL  Dialyzer Clearance Lightly streaked  Tolerated HD Treatment  (catheter no working)  Post-Hemodialysis Comments catheter stop working  Hemodialysis Catheter Left Internal jugular Triple lumen Temporary (Non-Tunneled)  Placement Date/Time: 11/29/18 1500   Placed prior to admission: No  Time Out: Correct patient;Correct site;Correct procedure  Maximum sterile barrier precautions: Hand hygiene;Cap;Mask;Sterile gown  Site Prep: Chlorhexidine (preferred)  Local Anesthet...  Site Condition No complications  Blue Lumen Status Flushed;Saline locked  Red Lumen Status Flushed;Saline locked  Catheter fill volume (Arterial) 1.4 cc  Catheter fill volume (Venous) 1.4  Dressing Type Biopatch;Occlusive  Drainage Description  None  Post treatment catheter status Capped and Clamped

## 2018-12-03 NOTE — Progress Notes (Signed)
Palliative note:   Palliative service has attempted to engage with patient's family on multiple occasions. Family does not wish to engage with Palliative service.   Please feel free to reconsult if family desires services of Palliative Medicine team.   Palliative team will sign off for now.  Mariana Kaufman, AGNP-C Palliative Medicine  Please call Palliative Medicine team phone with any questions [phone]. For individual providers please see AMION.  No charge.

## 2018-12-03 NOTE — Progress Notes (Signed)
Unable to run full treatment due to poor functioning of hd catheter, Dr Candiss Norse notified only 22 minutes of treatment received   12/03/18 1530  Vital Signs  Temp 98.8 F (37.1 C)  Temp Source Axillary  Pulse Rate (!) 110  Resp (!) 24  BP 96/63  Oxygen Therapy  SpO2 (!) 86 %  End Tidal CO2 (EtCO2) 28  Post-Hemodialysis Assessment  Rinseback Volume (mL) 250 mL  Dialyzer Clearance Lightly streaked  Hemodialysis Intake (mL) 500 mL  UF Total -Machine (mL) 134 mL  Net UF (mL) -366 mL  Tolerated HD Treatment No (Comment)  Post-Hemodialysis Comments unable to run full treatment see progress note  Education / Care Plan  Dialysis Education Provided Yes  Hemodialysis Catheter Left Internal jugular Triple lumen Temporary (Non-Tunneled)  Placement Date/Time: 11/29/18 1500   Placed prior to admission: No  Time Out: Correct patient;Correct site;Correct procedure  Maximum sterile barrier precautions: Hand hygiene;Cap;Mask;Sterile gown  Site Prep: Chlorhexidine (preferred)  Local Anesthet...  Site Condition No complications  Blue Lumen Status Flushed;Saline locked  Red Lumen Status Flushed;Saline locked  Dressing Type Biopatch;Occlusive  Dressing Status Clean;Dry;Intact  Post treatment catheter status Capped and Clamped

## 2018-12-03 NOTE — Procedures (Signed)
  PROCEDURE: BRONCHOSCOPY   PROCEDURE DATE: 12/03/2018  TIME:  NAME:  Kyle White  DOB:29-Sep-1960  MRN: [id number] LOC:  IC05A/IC05A-AA    HOSP DAY: @LENGTHOFSTAYDAYS @ CODE STATUS:      Code Status Orders  (From admission, onward)         Start     Ordered   11/22/18 1642  Do not attempt resuscitation (DNR)  Continuous    Question Answer Comment  In the event of cardiac or respiratory ARREST Do not call a "code blue"   In the event of cardiac or respiratory ARREST Do not perform Intubation, CPR, defibrillation or ACLS   In the event of cardiac or respiratory ARREST Use medication by any route, position, wound care, and other measures to relive pain and suffering. May use oxygen, suction and manual treatment of airway obstruction as needed for comfort.      11/22/18 1641        Code Status History    Date Active Date Inactive Code Status Order ID Comments User Context   11/10/2018 1821 11/22/2018 1641 Full Code [id number]  Mayer Camel, NP ED   Advance Care Planning Activity          Indications/Preliminary Diagnosis:   Consent: (Place X beside choice/s below)  The benefits, risks and possible complications of the procedure were        explained to:  ___ patient  ___ patient's family  ___ other:___________  who verbalized understanding and gave:  ___ verbal  ___ written  ___ verbal and written  ___ telephone  ___ other:________ consent.    x  Unable to obtain consent; procedure performed on emergent basis.     Other:       PRESEDATION ASSESSMENT: History and Physical has been performed. Patient meds and allergies have been reviewed. Presedation airway examination has been performed and documented. Baseline vital signs, sedation score, oxygenation status, and cardiac rhythm were reviewed. Patient was deemed to be in satisfactory condition to undergo the procedure.        PROCEDURE DETAILS: Timeout performed and correct patient, name, & ID confirmed. Following  prep per Pulmonary policy, appropriate sedation was administered. The Bronchoscope was inserted in to oral cavity with bite block in place. Therapeutic aspiration of Tracheobronchial tree was performed.  Airway exam proceeded with findings, technical procedures, and specimen collection as noted below. At the end of exam the scope was withdrawn without incident. Impression and Plan as noted below.           Airway Prep (Place X beside choice below)   1% Transtracheal Lidocaine Anesthetization 7 cc   Patient prepped per Bronchoscopy Lab Policy       Insertion Route (Place X beside choice below)   Nasal   Oral  x Endotracheal Tube   Tracheostomy       FINDINGS: ETT located upper trahea, advance to lower trachea with bronchoscopic  visualization ESTIMATED BLOOD LOSS: none COMPLICATIONS/RESOLUTION: none      IMPRESSION:POST-PROCEDURE DX:  Adjustment of ETT   RECOMMENDATION/PLAN: continue Vent support  Follow up CXR   Corrin Parker, M.D.  [facility]  Medical Director [place] Director [place] Department

## 2018-12-03 NOTE — Progress Notes (Signed)
CRITICAL CARE NOTE PT PROFILE: 58 y.o. M with hx of type 2 diabetes, chronic AF, pickwickian syndrome, hypertension, congestive heart failure adm 8/28 via ED to ICU/SDU with acute on chronic respiratory failure felt to be due to CHF/pulmonary edema CC  follow up respiratory failure  SUBJECTIVE Patient remains critically ill Prognosis is guarded Full vent support Started HD last week   BP 129/85   Pulse 61   Temp 98.6 F (37 C) (Axillary)   Resp (!) 24   Ht 5' 9.02" (1.753 m)   Wt (!) 223.6 kg   SpO2 97%   BMI 72.77 kg/m    I/O last 3 completed shifts: In: 7413.5 [I.V.:2819; Other:10; XI/PJ:8250.5; IV Piggyback:295.8] Out: 2170 [Urine:2170] No intake/output data recorded.  SpO2: 97 % O2 Flow Rate (L/min): 3 L/min FiO2 (%): (S) 40 %   MAJOR EVENTS/TEST RESULTS: 08/28 admitted to ICU/SDU for BiPAP. PCCM consultation 08/28 emergently intubated 08/29 Echocardiogram: EF 55-60%.  LV cavity mildly dilated.  Diastolic function could not be evaluated.  Left atrium mildly dilated.  RV not assessed.  RA size not well visualized. 08/29 CTAP: No acute intra-abdominal process noted.  No perforation.  Trace ascites.  Severe hepatic steatosis.  Cholelithiasis.  Small R and trace L pleural effusions.  Bilateral lower lobe airspace disease may represent atelectasis but pneumonia is difficult to exclude, particularly on the R. 08/30 Extubated 08/30 Cardiology consultation: recommended diltiazem gtt for rapid AF and continue anticoagulation 08/31 reintubated emergently 08/31 bronchoscopy for mucous plugging and complete collapse of R lung 08/31 Palliative Care consultation to address goals of care 09/03 DNR established 09/05 Nephrology consultation for AKI 09/06 Renal US: Normal appearance of the kidneys 09/06 requiring 100% FiO2 and PEEP 15 cm H2O 09/07 ETT cuff leak, ETT changed 09/08 CTH: No acute intracranial abnormality seen 09/10 HD cath placed and intermittent HD initiated 09/11  + F/C. Improving vent requirements. 2 hrs of HD performed 09/12 improving vent requirements.  Low-dose clonazepam and Duragesic patch initiated.  Goals of care discussion with patient's sister and recommended tracheostomy tube. 09/13 Severe desaturation with increased purulent respiratory secretions and volume loss due to mucus plugging.  Respiratory culture obtained.  Antibiotics expanded to piperacillin-tazobactam.  DNR confirmed with patient's sister.  Continuous sedation infusions initiated  INDWELLING DEVICES:: ETT 08/28 >> 08/30, 08/31 >> 09/07 (cuff leak), 09/07 >>  R IJ CVL 08/31 >>  L IJ HD cath 09/10 >>   MICRO DATA: SARS CoV 2 PCR 08/28 >> NEG MRSA PCR 08/28 >> NEG Urine 08/28 >> NEG Resp  08/28 >> abundant GPC chains Blood 08/29 >> NEG BAL 09/03 >> Staph simulans, strep mitis Blood 09/07 >> NEG RVP 09/09 >> NEG Resp 09/09 >> c/w NOF Resp 09/13 >>   ANTIMICROBIALS:  Cefepime 09/03 >> 09/08  Vancomycin 09/07 >> 09/10 Meropenem 09/09 >> 09/11 Ceftriaxone 09/11 >> 09/13 Pip-tazo 09/13 >>      REVIEW OF SYSTEMS  [name] UNABLE TO PROVIDE COMPLETE REVIEW OF SYSTEMS DUE TO SEVERE CRITICAL ILLNESS   PHYSICAL EXAMINATION:  GENERAL:critically ill appearing, +resp distress HEAD: Normocephalic, atraumatic.  EYES: Pupils equal, round, reactive to light.  No scleral icterus.  MOUTH: Moist mucosal membrane. NECK: Supple.  PULMONARY: +rhonchi, +wheezing CARDIOVASCULAR: S1 and S2. Regular rate and rhythm. No murmurs, rubs, or gallops.  GASTROINTESTINAL: Soft, nontender, -distended. No masses. Positive bowel sounds. No hepatosplenomegaly.  MUSCULOSKELETAL: +edema.  NEUROLOGIC: obtunded, GCS<8 SKIN:intact,warm,dry  MEDICATIONS: I have reviewed all medications and confirmed regimen as documented  CULTURE RESULTS   Recent Results (from the past 240 hour(s))  CULTURE, BLOOD (ROUTINE X 2) w Reflex to ID Panel     Status: None   Collection Time: 11/26/18 10:32 PM    Specimen: BLOOD  Result Value Ref Range Status   Specimen Description BLOOD RIGHT ANTECUBITAL  Final   Special Requests   Final    BOTTLES DRAWN AEROBIC AND ANAEROBIC Blood Culture adequate volume   Culture   Final    NO GROWTH 5 DAYS Performed at [facility], [address] [place], [place] [address]    Report Status 12/01/2018 FINAL  Final  CULTURE, BLOOD (ROUTINE X 2) w Reflex to ID Panel     Status: None   Collection Time: 11/26/18 10:32 PM   Specimen: BLOOD  Result Value Ref Range Status   Specimen Description BLOOD BLOOD RIGHT HAND  Final   Special Requests   Final    BOTTLES DRAWN AEROBIC AND ANAEROBIC Blood Culture adequate volume   Culture   Final    NO GROWTH 5 DAYS Performed at [facility], [address] [place], [place] [address]    Report Status 12/01/2018 FINAL  Final  Respiratory Panel by PCR     Status: None   Collection Time: 11/28/18 10:36 AM   Specimen: Nasopharyngeal Swab; Respiratory  Result Value Ref Range Status   Adenovirus NOT DETECTED NOT DETECTED Final   Coronavirus 229E NOT DETECTED NOT DETECTED Final    Comment: (NOTE) The Coronavirus on the Respiratory Panel, DOES NOT test for the novel  Coronavirus (2019 nCoV)    Coronavirus HKU1 NOT DETECTED NOT DETECTED Final   Coronavirus NL63 NOT DETECTED NOT DETECTED Final   Coronavirus OC43 NOT DETECTED NOT DETECTED Final   Metapneumovirus NOT DETECTED NOT DETECTED Final   Rhinovirus / Enterovirus NOT DETECTED NOT DETECTED Final   Influenza A NOT DETECTED NOT DETECTED Final   Influenza B NOT DETECTED NOT DETECTED Final   Parainfluenza Virus 1 NOT DETECTED NOT DETECTED Final   Parainfluenza Virus 2 NOT DETECTED NOT DETECTED Final   Parainfluenza Virus 3 NOT DETECTED NOT DETECTED Final   Parainfluenza Virus 4 NOT DETECTED NOT DETECTED Final   Respiratory Syncytial Virus NOT DETECTED NOT DETECTED Final   Bordetella pertussis NOT DETECTED NOT DETECTED Final   Chlamydophila  pneumoniae NOT DETECTED NOT DETECTED Final   Mycoplasma pneumoniae NOT DETECTED NOT DETECTED Final    Comment: Performed at [facility], [place] [address] [place], [place] [address]  Culture, respiratory (non-expectorated)     Status: None   Collection Time: 11/28/18 11:29 AM   Specimen: Tracheal Aspirate; Respiratory  Result Value Ref Range Status   Specimen Description   Final    TRACHEAL ASPIRATE Performed at [facility], [address], [place], [place] [address]    Special Requests   Final    NONE Performed at [facility], [address] [place], [place] [address]    Gram Stain   Final    FEW WBC PRESENT, PREDOMINANTLY PMN FEW SQUAMOUS EPITHELIAL CELLS PRESENT RARE GRAM POSITIVE COCCI IN PAIRS    Culture   Final    FEW Consistent with normal respiratory flora. Performed at [facility], [place] [address] [place], [place] [address]    Report Status 11/30/2018 FINAL  Final  Culture, respiratory (non-expectorated)     Status: None (Preliminary result)   Collection Time: 12/02/18  8:45 AM   Specimen: Tracheal Aspirate; Respiratory  Result Value Ref Range Status   Specimen Description   Final    TRACHEAL ASPIRATE Performed at [facility], [address] [place], [place] [address]    Special Requests   Final    NONE Performed at [facility], [address] [place], [place] [address]    Gram Stain   Final    NO WBC SEEN NO ORGANISMS SEEN Performed at [facility], [place] [address] [place], [place] [address]    Culture PENDING  Incomplete   Report Status PENDING  Incomplete          IMAGING    Dg Chest Port 1 View  Result Date: 12/03/2018 CLINICAL DATA:  Respiratory failure. EXAM: PORTABLE CHEST 1 VIEW COMPARISON:  Radiograph of December 19, 2018. FINDINGS: Stable cardiomegaly. Endotracheal and nasogastric tubes are unchanged in position. Bilateral internal jugular catheters are noted which are unchanged  in position. No pneumothorax is noted. Stable bibasilar atelectasis is noted with small pleural effusions. Bony thorax is unremarkable. IMPRESSION: Stable support apparatus. Stable bibasilar opacities as described above. Electronically Signed   By: Marijo Conception M.D.   On: 12/03/2018 07:41   Dg Chest Port 1 View  Result Date: 12/02/2018 CLINICAL DATA:  ET placement EXAM: PORTABLE CHEST 1 VIEW COMPARISON:  Same-day radiograph FINDINGS: Portion of the left lung base is collimated from view. Interval placement of the endotracheal tube within the upper trachea approximately 8 cm from the carina. Recommend advancement 3-4 cm to position in the mid trachea. Transesophageal tube tip and side port distal to the GE junction. Left IJ catheter tip terminates at the brachiocephalic-caval confluence. Right IJ catheter tip terminates in the lower SVC. Some improving aeration is noted in the right hemithorax. Persistent opacity is noted in both lung bases, right greater than left. Cardiomediastinal silhouette is still largely obscured. IMPRESSION: Interval placement of the endotracheal tube within the upper trachea approximately 8 cm from the carina. Recommend advancement 3-4 cm to position in the mid trachea. Remaining lines and tubes in stable position. Improving aeration of the right lung. These results will be called to the ordering clinician or representative by the Radiologist Assistant, and communication documented in the PACS or zVision Dashboard. Electronically Signed   By: Lovena Le M.D.   On: 12/02/2018 22:16   Dg Chest Port 1 View  Result Date: 12/02/2018 CLINICAL DATA:  Respiratory failure EXAM: PORTABLE CHEST 1 VIEW COMPARISON:  12/02/2018, 12/01/2018, 11/30/2018 FINDINGS: Endotracheal tube tip slightly above thoracic inlet and is 9.8 cm superior to the carina. Left IJ central venous catheter tip projects over the brachiocephalic confluence. Esophageal tube tip courses below diaphragm but is non  included. Right IJ central venous catheter tip over the SVC. Interval diffuse white out of the right thorax. Truncated appearance of the right bronchus. Suspected moderate left pleural effusion with edema or layering fluid on the left. Consolidation left base. Obscured cardiomediastinal silhouette. IMPRESSION: 1. Endotracheal tube tip slightly above thoracic inlet and is 9.8 cm superior to carina 2. Interval diffuse white out of the right thorax. Truncated appearance of the right bronchus suggestive of occlusive process. 3. Continued left pleural effusion with dense airspace disease at the left base. Electronically Signed   By: Donavan Foil M.D.   On: 12/02/2018 21:27   Dg Chest Port 1 View  Result Date: 12/02/2018 CLINICAL DATA:  Acute respiratory failure. EXAM: PORTABLE CHEST 1 VIEW COMPARISON:  12/01/2018 FINDINGS: [name] rotated to the right. Enteric tube courses  into the stomach as tip is not definitely visualized. Endotracheal tube has tip approximately 7 cm above the carina. Left IJ central venous catheter unchanged as well as right IJ central venous catheter unchanged. Exam demonstrates worsening bilateral perihilar/bibasilar opacification. Findings likely due to layering bilateral effusions with bibasilar atelectasis as well as interstitial edema. Infection is possible. Cardiomegaly. Remainder of the exam is unchanged. IMPRESSION: Worsening bilateral perihilar bibasilar opacification likely worsening effusions and CHF. Infection is possible. Tubes and lines as described. Electronically Signed   By: Marin Olp M.D.   On: 12/02/2018 09:13       Indwelling Urinary Catheter continued, requirement due to   Reason to continue Indwelling Urinary Catheter strict Intake/Output monitoring for hemodynamic instability   Central Line/ continued, requirement due to  Reason to continue [place] of central venous pressure or other hemodynamic parameters and poor IV access   Ventilator  continued, requirement due to severe respiratory failure   Ventilator Sedation RASS 0 to -2      ASSESSMENT AND PLAN SYNOPSIS 58 y.o. M with hx of type 2 diabetes, chronic AF, pickwickian syndrome, hypertension, congestive heart failure adm 8/28 via ED to ICU/SDU with acute on chronic respiratory failure felt to be due to CHF/pulmonary edema complicated by progressive renal failure and resp failure and failure to wean from vent failed extubation x 1   Severe ACUTE Hypoxic and Hypercapnic Respiratory Failure -continue Full MV support -continue Bronchodilator Therapy -Wean Fio2 and PEEP as tolerated Unable to wean from vent due to severe resp failure and morbid obesity  ACUTE DIASTOLIC CARDIAC FAILURE-  Vent support Remove fluid per HD    ACUTE KIDNEY INJURY/Renal Failure -follow chem 7 -follow UO -continue Foley Catheter-assess need -Avoid nephrotoxic agents -Recheck creatinine  HD as needed   NEUROLOGY - intubated and sedated - minimal sedation to achieve a RASS goal: -1   CARDIAC ICU monitoring  ID -continue IV abx as prescibed -follow up cultures  GI GI PROPHYLAXIS as indicated  NUTRITIONAL STATUS DIET-->TF's as tolerated Constipation protocol as indicated  ENDO - will use ICU hypoglycemic\Hyperglycemia protocol if indicated   ELECTROLYTES -follow labs as needed -replace as needed -pharmacy consultation and following   DVT/GI PRX ordered TRANSFUSIONS AS NEEDED MONITOR FSBS ASSESS the need for LABS as needed   Critical Care Time devoted to patient care services described in this note is 45 minutes.   Overall, patient is critically ill, prognosis is guarded.  Patient with Multiorgan failure and at high risk for cardiac arrest and death.   Multiorgan failure Very poor prognosis and very low chance of meaningful recovery [name] DNR  Corrin Parker, M.D.  [facility]  Medical Director [place] Director [facility] Cardio-Pulmonary Department

## 2018-12-03 NOTE — Consult Note (Signed)
Kyle, White [id number] Sep 03, 1960  Reason for Consult: Evaluate for tracheostomy Requesting Physician:  Fritzi Mandes, MD   HPI: The patient is a 58 year old white male with multiple medical problems including obstructive sleep apnea on CPAP at home.  He has had congestive heart failure and morbid obesity on home O2.  He developed acute on chronic respiratory failure 2 weeks ago and was admitted to the hospital with ARDS and pneumonia involving entire white out of the right lung.  He has had pickwickian syndrome as well.  He required intubation and has been vent dependent.  He was extubated after a couple of days but required reintubation and has been on the ventilator since then.  Is undergone bronchoscopy for right lung and continues to be vent dependent.  Because of the prolonged intubation and inability to wean from the ventilator he needs to have a tracheostomy place for controlling his airway.  Consultation was placed for evaluation for this.  He also has uncontrolled sleep apnea that a tracheostomy will help as well.  ROS:  Negative except as in HPI.  Past Medical History:  Diagnosis Date  . Atrial fibrillation ([place])   . CHF (congestive heart failure) ([place])   . Diabetes mellitus without complication ([place])   . Gout   . Hypertension associated with type 2 diabetes mellitus ([place])   . Morbid obesity ([place])   . Obstructive sleep apnea     No Active Allergies  Scheduled Medications: . aspirin  81 mg Per NG tube Daily  . chlorhexidine gluconate (MEDLINE KIT)  15 mL Mouth Rinse BID  . Chlorhexidine Gluconate Cloth  6 each Topical Daily  . clonazePAM  1 mg Per Tube Daily  . clonazePAM  2 mg Per Tube QHS  . feeding supplement (PRO-STAT SUGAR FREE 64)  60 mL Per Tube TID  . free water  300 mL Per Tube Q4H  . insulin aspart  0-20 Units Subcutaneous Q4H  . insulin aspart  2 Units Subcutaneous Q4H  . [START ON 12/04/2018] insulin glargine  40 Units Subcutaneous Daily  .  ipratropium-albuterol  3 mL Nebulization Q6H  . lactulose  30 g Per Tube TID  . mouth rinse  15 mL Mouth Rinse 10 times per day  . multivitamin  15 mL Per Tube Daily  . pantoprazole sodium  40 mg Per Tube QHS  . polyethylene glycol  17 g Per Tube BID  . senna-docusate  2 tablet Per Tube TID  . sodium chloride flush  10-40 mL Intracatheter Q12H  . vancomycin variable dose per unstable renal function (pharmacist dosing)   Does not apply See admin instructions    PRN Meds: sodium chloride, acetaminophen, bisacodyl, fentaNYL, metoprolol tartrate, midazolam, ondansetron (ZOFRAN) IV, sodium chloride flush  Infusions: . sodium chloride Stopped (12/03/18 0527)  . amiodarone 30 mg/hr (12/03/18 1600)  . feeding supplement (VITAL HIGH PROTEIN) 1,000 mL (12/03/18 0223)  . fentaNYL infusion INTRAVENOUS 150 mcg/hr (12/03/18 1600)  . phenylephrine (NEO-SYNEPHRINE) Adult infusion 20 mcg/min (12/03/18 1600)  . propofol (DIPRIVAN) infusion 10 mcg/kg/min (12/03/18 1600)     Physical Exam:  Vitals:   12/03/18 1730 12/03/18 1745  BP: 122/67 100/64  Pulse: 72 78  Resp: (!) 24 (!) 24  Temp:    SpO2: (!) 73% (!) 73%    He is seen in bed #5 in the ICU.   He is morbidly obese.  He has oral endotracheal tube in with an oral gastric tube in as well.  He is sedated and  not responding at all.  He is vent dependent.  He has an extremely wide neck but I can feel the laryngeal landmarks.  There is no sign of swelling or bruising in his neck.  The skin and tissues are soft.  He has a full beard.  His abdomen is massively obese as are his extremities.  IMPRESSION: [name] vent dependent and has had prolonged intubation and failure at weaning.  He needs to have a tracheostomy tube.  He is very high risk because of the multiple medical challenges.  He also has a low platelet count.  He is undergoing dialysis as well and has a blocked catheter where he has not had full dialysis in over 5 days.  He is very high  risk for complications and or death.  PLAN:   Tentatively have set up for tracheostomy to be done 2 days from now if he is medically stable at that time.  We will discuss this with the patient's wife as well.    Kyle White Kyle White 12/03/2018, 6:20 PM

## 2018-12-03 NOTE — Progress Notes (Signed)
Unable to run hemodialysis treatment due to poor catheter function, venous port no blood return and arterial port has blood return and able to flush, once treatment started arterial pressure continued to bottom out causing hemodialysis system to stop, notified DR Candiss Norse of issues and he stated to rinse patient back and stop treatment and to notify primary about situation that new catheter is needed and will reevaluate in the AM. Only 27 minutes of HD noted

## 2018-12-03 NOTE — Progress Notes (Signed)
[facility], [place] 12/03/18  Subjective:   LOS: 17 09/13 0701 - 09/14 0700 In: 7135.2 [I.V.:2640.6; IP/PG:9842.1; IV Piggyback:195.8] Out: 0312 [Urine:1365] Critically ill Vent assisted. Fio2 40% Neo, amiodarone, zosyn drips Sedation - fentanyl   Objective:  Vital signs in last 24 hours:  Temp:  [98.1 F (36.7 C)-99.1 F (37.3 C)] 98.6 F (37 C) (09/14 0345) Pulse Rate:  [53-134] 61 (09/14 0600) Resp:  [24] 24 (09/14 0600) BP: (72-167)/(49-135) 129/85 (09/14 0600) SpO2:  [79 %-100 %] 97 % (09/14 0600) FiO2 (%):  [40 %-100 %] 40 % (09/14 0512) Weight:  [223.6 kg] 223.6 kg (09/14 0400)  Weight change: 5.443 kg Filed Weights   11/28/18 0451 12/02/18 0500 12/03/18 0400  Weight: (!) 216.4 kg (!) 218.2 kg (!) 223.6 kg    Intake/Output:    Intake/Output Summary (Last 24 hours) at 12/03/2018 0827 Last data filed at 12/03/2018 0600 Gross per 24 hour  Intake 7135.16 ml  Output 1365 ml  Net 5770.16 ml     Physical Exam: General: criticall ill apearing  HEENT ETT in place  Pulm/lungs Vent assisted  CVS/Heart Irregular, tachycardic  Abdomen:  Obese, dependent edema  Extremities: SCDs, + dependent edema  Neurologic: sedated  Skin: warm  Access: Left IJ trialysis   Foley in place    Basic Metabolic Panel:  Recent Labs  Lab 11/29/18 0504 11/30/18 0632 12/01/18 0419 12/02/18 0456 12/03/18 0435  NA 147* 147* 146* 147* 147*  K 4.3 4.2 4.0 4.3 3.5  CL 115* 111 111 111 110  CO2 22 23 25 26 22   GLUCOSE 154* 241* 302* 252* 292*  BUN 126* 115* 111* 128* 148*  CREATININE 3.17* 3.11* 3.07* 3.27* 3.53*  CALCIUM 7.7* 7.7* 7.9* 8.3* 8.1*  MG  --  2.8*  --   --   --   PHOS  --  7.1*  --   --   --      CBC: Recent Labs  Lab 11/29/18 0504 11/30/18 0632 12/01/18 0419 12/02/18 0456 12/03/18 0435  WBC 11.7* 8.9 9.1 13.2* 8.4  NEUTROABS  --  8.3*  --   --  7.2  HGB 12.9* 13.0 12.4* 12.7* 10.6*  HCT 41.2 40.4 39.3 41.6 32.3*  MCV  95.6 93.7 94.9 97.9 93.4  PLT 57* 45* 46* 61* 57*      Lab Results  Component Value Date   HEPBSAG Negative 11/29/2018   HEPBSAB Non Reactive 11/29/2018   HEPBIGM Negative 11/29/2018      Microbiology:  Recent Results (from the past 240 hour(s))  CULTURE, BLOOD (ROUTINE X 2) w Reflex to ID Panel     Status: None   Collection Time: 11/26/18 10:32 PM   Specimen: BLOOD  Result Value Ref Range Status   Specimen Description BLOOD RIGHT ANTECUBITAL  Final   Special Requests   Final    BOTTLES DRAWN AEROBIC AND ANAEROBIC Blood Culture adequate volume   Culture   Final    NO GROWTH 5 DAYS Performed at [facility], [address] [place], [place] [address]    Report Status 12/01/2018 FINAL  Final  CULTURE, BLOOD (ROUTINE X 2) w Reflex to ID Panel     Status: None   Collection Time: 11/26/18 10:32 PM   Specimen: BLOOD  Result Value Ref Range Status   Specimen Description BLOOD BLOOD RIGHT HAND  Final   Special Requests   Final    BOTTLES DRAWN AEROBIC AND ANAEROBIC Blood Culture adequate volume  Culture   Final    NO GROWTH 5 DAYS Performed at [facility], [address] [place], [place] [address]    Report Status 12/01/2018 FINAL  Final  Respiratory Panel by PCR     Status: None   Collection Time: 11/28/18 10:36 AM   Specimen: Nasopharyngeal Swab; Respiratory  Result Value Ref Range Status   Adenovirus NOT DETECTED NOT DETECTED Final   Coronavirus 229E NOT DETECTED NOT DETECTED Final    Comment: (NOTE) The Coronavirus on the Respiratory Panel, DOES NOT test for the novel  Coronavirus (2019 nCoV)    Coronavirus HKU1 NOT DETECTED NOT DETECTED Final   Coronavirus NL63 NOT DETECTED NOT DETECTED Final   Coronavirus OC43 NOT DETECTED NOT DETECTED Final   Metapneumovirus NOT DETECTED NOT DETECTED Final   Rhinovirus / Enterovirus NOT DETECTED NOT DETECTED Final   Influenza A NOT DETECTED NOT DETECTED Final   Influenza B NOT DETECTED NOT DETECTED Final    Parainfluenza Virus 1 NOT DETECTED NOT DETECTED Final   Parainfluenza Virus 2 NOT DETECTED NOT DETECTED Final   Parainfluenza Virus 3 NOT DETECTED NOT DETECTED Final   Parainfluenza Virus 4 NOT DETECTED NOT DETECTED Final   Respiratory Syncytial Virus NOT DETECTED NOT DETECTED Final   Bordetella pertussis NOT DETECTED NOT DETECTED Final   Chlamydophila pneumoniae NOT DETECTED NOT DETECTED Final   Mycoplasma pneumoniae NOT DETECTED NOT DETECTED Final    Comment: Performed at [facility], [place]. [address] [place], [place] [address]  Culture, respiratory (non-expectorated)     Status: None   Collection Time: 11/28/18 11:29 AM   Specimen: Tracheal Aspirate; Respiratory  Result Value Ref Range Status   Specimen Description   Final    TRACHEAL ASPIRATE Performed at [facility], [address], [place], [place] [address]    Special Requests   Final    NONE Performed at [facility], [address] [place], [place] [address]    Gram Stain   Final    FEW WBC PRESENT, PREDOMINANTLY PMN FEW SQUAMOUS EPITHELIAL CELLS PRESENT RARE GRAM POSITIVE COCCI IN PAIRS    Culture   Final    FEW Consistent with normal respiratory flora. Performed at [facility], [place] [address] [place], [place] [address]    Report Status 11/30/2018 FINAL  Final  Culture, respiratory (non-expectorated)     Status: None (Preliminary result)   Collection Time: 12/02/18  8:45 AM   Specimen: Tracheal Aspirate; Respiratory  Result Value Ref Range Status   Specimen Description   Final    TRACHEAL ASPIRATE Performed at [facility], [address], [place], [place] [address]    Special Requests   Final    NONE Performed at [facility], [address] [place], [place] [address]    Gram Stain   Final    NO WBC SEEN NO ORGANISMS SEEN Performed at [facility], [place] [address] [place], [place] [address]    Culture PENDING  Incomplete   Report Status  PENDING  Incomplete    Coagulation Studies: No results for input(s): LABPROT, INR in the last 72 hours.  Urinalysis: No results for input(s): COLORURINE, LABSPEC, PHURINE, GLUCOSEU, HGBUR, BILIRUBINUR, KETONESUR, PROTEINUR, UROBILINOGEN, NITRITE, LEUKOCYTESUR in the last 72 hours.  Invalid input(s): APPERANCEUR    Imaging: Dg Chest Port 1 View  Result Date: 12/03/2018 CLINICAL DATA:  Respiratory failure. EXAM: PORTABLE CHEST 1 VIEW COMPARISON:  Radiograph of December 19, 2018. FINDINGS: Stable  cardiomegaly. Endotracheal and nasogastric tubes are unchanged in position. Bilateral internal jugular catheters are noted which are unchanged in position. No pneumothorax is noted. Stable bibasilar atelectasis is noted with small pleural effusions. Bony thorax is unremarkable. IMPRESSION: Stable support apparatus. Stable bibasilar opacities as described above. Electronically Signed   By: Marijo Conception M.D.   On: 12/03/2018 07:41   Dg Chest Port 1 View  Result Date: 12/02/2018 CLINICAL DATA:  ET placement EXAM: PORTABLE CHEST 1 VIEW COMPARISON:  Same-day radiograph FINDINGS: Portion of the left lung base is collimated from view. Interval placement of the endotracheal tube within the upper trachea approximately 8 cm from the carina. Recommend advancement 3-4 cm to position in the mid trachea. Transesophageal tube tip and side port distal to the GE junction. Left IJ catheter tip terminates at the brachiocephalic-caval confluence. Right IJ catheter tip terminates in the lower SVC. Some improving aeration is noted in the right hemithorax. Persistent opacity is noted in both lung bases, right greater than left. Cardiomediastinal silhouette is still largely obscured. IMPRESSION: Interval placement of the endotracheal tube within the upper trachea approximately 8 cm from the carina. Recommend advancement 3-4 cm to position in the mid trachea. Remaining lines and tubes in stable position. Improving aeration of  the right lung. These results will be called to the ordering clinician or representative by the Radiologist Assistant, and communication documented in the PACS or zVision Dashboard. Electronically Signed   By: Lovena Le M.D.   On: 12/02/2018 22:16   Dg Chest Port 1 View  Result Date: 12/02/2018 CLINICAL DATA:  Respiratory failure EXAM: PORTABLE CHEST 1 VIEW COMPARISON:  12/02/2018, 12/01/2018, 11/30/2018 FINDINGS: Endotracheal tube tip slightly above thoracic inlet and is 9.8 cm superior to the carina. Left IJ central venous catheter tip projects over the brachiocephalic confluence. Esophageal tube tip courses below diaphragm but is non included. Right IJ central venous catheter tip over the SVC. Interval diffuse white out of the right thorax. Truncated appearance of the right bronchus. Suspected moderate left pleural effusion with edema or layering fluid on the left. Consolidation left base. Obscured cardiomediastinal silhouette. IMPRESSION: 1. Endotracheal tube tip slightly above thoracic inlet and is 9.8 cm superior to carina 2. Interval diffuse white out of the right thorax. Truncated appearance of the right bronchus suggestive of occlusive process. 3. Continued left pleural effusion with dense airspace disease at the left base. Electronically Signed   By: Donavan Foil M.D.   On: 12/02/2018 21:27   Dg Chest Port 1 View  Result Date: 12/02/2018 CLINICAL DATA:  Acute respiratory failure. EXAM: PORTABLE CHEST 1 VIEW COMPARISON:  12/01/2018 FINDINGS: [name] rotated to the right. Enteric tube courses into the stomach as tip is not definitely visualized. Endotracheal tube has tip approximately 7 cm above the carina. Left IJ central venous catheter unchanged as well as right IJ central venous catheter unchanged. Exam demonstrates worsening bilateral perihilar/bibasilar opacification. Findings likely due to layering bilateral effusions with bibasilar atelectasis as well as interstitial edema. Infection  is possible. Cardiomegaly. Remainder of the exam is unchanged. IMPRESSION: Worsening bilateral perihilar bibasilar opacification likely worsening effusions and CHF. Infection is possible. Tubes and lines as described. Electronically Signed   By: Marin Olp M.D.   On: 12/02/2018 09:13     Medications:   . sodium chloride Stopped (12/03/18 0527)  . amiodarone 30 mg/hr (12/03/18 0600)  . feeding supplement (VITAL HIGH PROTEIN) 1,000 mL (12/03/18 0223)  . fentaNYL infusion INTRAVENOUS 150 mcg/hr (12/03/18 0600)  .  phenylephrine (NEO-SYNEPHRINE) Adult infusion 50 mcg/min (12/03/18 0600)  . piperacillin-tazobactam (ZOSYN)  IV 12.5 mL/hr at 12/03/18 0600  . propofol (DIPRIVAN) infusion 10 mcg/kg/min (12/03/18 0600)   . aspirin  81 mg Per NG tube Daily  . chlorhexidine gluconate (MEDLINE KIT)  15 mL Mouth Rinse BID  . Chlorhexidine Gluconate Cloth  6 each Topical Daily  . clonazePAM  1 mg Per Tube Daily  . clonazePAM  2 mg Per Tube QHS  . feeding supplement (PRO-STAT SUGAR FREE 64)  60 mL Per Tube TID  . fentaNYL  1 patch Transdermal Q72H  . free water  300 mL Per Tube Q4H  . insulin aspart  0-20 Units Subcutaneous Q4H  . insulin glargine  30 Units Subcutaneous Daily  . ipratropium-albuterol  3 mL Nebulization Q6H  . mouth rinse  15 mL Mouth Rinse 10 times per day  . multivitamin  15 mL Per Tube Daily  . pantoprazole sodium  40 mg Per Tube QHS  . polyethylene glycol  17 g Per Tube BID  . senna-docusate  2 tablet Per Tube BID  . sodium chloride flush  10-40 mL Intracatheter Q12H   sodium chloride, acetaminophen, bisacodyl, fentaNYL, metoprolol tartrate, midazolam, ondansetron (ZOFRAN) IV, sodium chloride flush  Assessment/ Plan:  58 y.o. male with with atrial fibrillation on warfarin, congestive heart failure, diabetes mellitus type 2, obstructive sleep apnea,morbid obesity, gout, mitral valve replacementwho was [facility]for acute exacerbation of COPD  Active  Problems:   Acute exacerbation of CHF (congestive heart failure) ([place])   Acute respiratory failure with hypoxia and hypercapnia ([place])   Goals of care, counseling/discussion   Palliative care by specialist   DNR (do not resuscitate) discussion   #. ARF on CKD st3 Recent Labs    11/30/18 0632 12/01/18 0419 12/02/18 0456 12/03/18 0435  CREATININE 3.11* 3.07* 3.27* 3.53*  S Creatinine remains elevated BUN is critically high UOP about 1300 cc Patient will benefit from HD to correct uremia  Orders prepared - Avoid nephrotoxins such as NSAIDs, iv contrast, Fleets enema  #. Anemia    Lab Results  Component Value Date   HGB 10.6 (L) 12/03/2018   # Acute resp failure - Vent dependent - Fio2 40%  # Edema - 2 D echo from 8/29 shows LVEF 55-60% (nomral). Abnormal severe thickening of aortic wall Tricuspid valve and Rt vent were not assesed - Amiodarone for A Fib - not on diuretic, will attempt UF with HD  #. Diabetes type 2 with CKD Hgb A1c MFr Bld (%)  Date Value  11/17/2018 7.3 (H)     LOS: [place] 9/14/20208:27 [place], [place]

## 2018-12-03 NOTE — Progress Notes (Signed)
RT X2, exchanged patient's ET Tube holder. Patient tolerated well. No complications noted.

## 2018-12-03 NOTE — Progress Notes (Signed)
Per NP verbal and CXR results, ETT at 23cm was 9.8 cm above the carina. ETT advanced to 28cm. Repeat CXR to confirm placement. Patient tolerated ETT advancement well with no complications. Patient still presents with small leak.

## 2018-12-03 NOTE — Progress Notes (Signed)
TREATMENT RESUMED AFTER CATH-FLO DWELL, ARTERIAL PRESSURES NOTABLY HIGH, CONTINUE NO BLOOD RETURN OF VENOUS PORT, BFR 300,  GOAL TO REMOVE 1.5L , 3K BATH   12/03/18 1730  Vital Signs  Pulse Rate 72  Resp (!) 24  BP 122/67  BP Location Right Arm  BP Method Automatic  Patient Position (if appropriate) Lying  Oxygen Therapy  SpO2 (!) 73 %  End Tidal CO2 (EtCO2) 32  During Hemodialysis Assessment  Blood Flow Rate (mL/min) 300 mL/min  Arterial Pressure (mmHg) -230 mmHg  Venous Pressure (mmHg) 110 mmHg  Transmembrane Pressure (mmHg) 60 mmHg  Ultrafiltration Rate (mL/min) 670 mL/min  Dialysate Flow Rate (mL/min) 600 ml/min  Conductivity: Machine  14.2  HD Safety Checks Performed Yes  Dialysis Fluid Bolus Normal Saline  Bolus Amount (mL) 250 mL  Intra-Hemodialysis Comments See progress note (treatment resumed after cath flo dwell)  Education / Care Plan  Dialysis Education Provided Yes  Documented Education in Care Plan Yes

## 2018-12-03 NOTE — Progress Notes (Signed)
RT assisted ICU NP with ETT exchange. Patient successfully re-intubated with 8.5 ETT, secured at 28cm. ETT tube holder also replaced. Patient tolerating well. Will continue to monitor.

## 2018-12-03 NOTE — Progress Notes (Signed)
New orders given to TPA ports and dwell for 1 hour

## 2018-12-03 NOTE — Procedures (Signed)
Endotracheal Intubation: replacement of dislodged ETT Patient required placement of an artificial airway secondary to Respiratory Failure  Consent: Emergent.   Hand washing performed prior to starting the procedure.   Medications administered for sedation prior to procedure:  etomidate 20 mg   Rocuronium 10 mg IV, Fentanyl 100 mcg IV.     A time out procedure was called and correct patient, name, & ID confirmed. Needed supplies and equipment were assembled and checked to include ETT, 10 ml syringe, Glidescope, Mac and Miller blades, suction, oxygen and bag mask valve, end tidal CO2 monitor.   Patient was positioned to align the mouth and pharynx to facilitate visualization of the glottis.   Heart rate, SpO2 and blood pressure was continuously monitored during the procedure. Pre-oxygenation was conducted prior to intubation and endotracheal tube was placed through the vocal cords into the trachea.     The artificial airway was placed under direct visualization via glidescope route using a 8.5 ETT on the first attempt.  ETT was secured at 23 cm mark.  Placement was confirmed by auscuitation of lungs with good breath sounds bilaterally and no stomach sounds.  Condensation was noted on endotracheal tube.   Pulse ox 98%.  CO2 detector in place with appropriate color change.   Complications: None .   Operator: Ragen Laver.   Chest radiograph ordered and pending.   Comments: OGT placed via glidescope.  Corrin Parker, M.D.  [facility]  Medical Director [place] Director [facility] Cardio-Pulmonary Department

## 2018-12-03 NOTE — Progress Notes (Signed)
[facility] [place] NAME: Dominik Ruston    MR#:  [id number]  DATE OF BIRTH:  01/01/1961  SUBJECTIVE:  Pt is afebrile,remains intubated on the ventilator, severely hypoxemic with underlying pickwickian syndrome,unsuccessful weaning trials.  Critically ill, uremic, started hemodialysis--issues with catheter clotting  underwent bronchoscopy 11/22/18.  Palliative care team is involved   patient now on IV pressers, amiodarone REVIEW OF SYSTEMS:   Review of Systems  Unable to perform ROS: Intubated  Psychiatric/Behavioral: Nervous/anxious:      DRUG ALLERGIES:   No Active Allergies  VITALS:  Blood pressure 97/70, pulse (!) 110, temperature 99.4 F (37.4 C), temperature source Axillary, resp. rate (!) 24, height 5' 9.02" (1.753 m), weight (!) 223.6 kg, SpO2 92 %.  PHYSICAL EXAMINATION:   Physical Exam  GENERAL:  58 y.o.-year-old patient lying in the bed with no acute distress. Morbidly obese Morbidly obese Critically ill intubated on the vent EYES: Pupils equal, round, reactive to light and accommodation. No scleral icterus. Extraocular muscles intact.  HEENT: Head atraumatic, normocephalic. Oropharynx and nasopharynx clear.  NECK:  Supple, no jugular venous distention. No thyroid enlargement, no tenderness.  LUNGS: Mod breath sounds bilaterally, positive wheezing, rales, rhonchi. CARDIOVASCULAR: S1, S2 normal. No murmurs, rubs, or gallops.  ABDOMEN: Soft, nontender, nondistended. Bowel sounds present. No organomegaly or mass.  EXTREMITIES: No cyanosis, clubbing or edema b/l.    NEUROLOGIC: sedated GCS less than 8 PSYCHIATRIC: sedated  and on the vent SKIN: No obvious rash, lesion, or ulcer.   LABORATORY PANEL:  CBC Recent Labs  Lab 12/03/18 0435  WBC 8.4  HGB 10.6*  HCT 32.3*  PLT 57*    Chemistries  Recent Labs  Lab 11/30/18 0632  12/03/18 0435  NA 147*   < > 147*  K 4.2   < > 3.5  CL 111   < > 110  CO2 23   < >  22  GLUCOSE 241*   < > 292*  BUN 115*   < > 148*  CREATININE 3.11*   < > 3.53*  CALCIUM 7.7*   < > 8.1*  MG 2.8*  --   --   AST  --    < > 35  ALT  --    < > 223*  ALKPHOS  --    < > 114  BILITOT  --    < > 0.8   < > = values in this interval not displayed.   Cardiac Enzymes No results for input(s): TROPONINI in the last 168 hours. RADIOLOGY:  Dg Chest Port 1 View  Result Date: 12/03/2018 CLINICAL DATA:  Respiratory failure. EXAM: PORTABLE CHEST 1 VIEW COMPARISON:  Radiograph of December 19, 2018. FINDINGS: Stable cardiomegaly. Endotracheal and nasogastric tubes are unchanged in position. Bilateral internal jugular catheters are noted which are unchanged in position. No pneumothorax is noted. Stable bibasilar atelectasis is noted with small pleural effusions. Bony thorax is unremarkable. IMPRESSION: Stable support apparatus. Stable bibasilar opacities as described above. Electronically Signed   By: Marijo Conception M.D.   On: 12/03/2018 07:41   Dg Chest Port 1 View  Result Date: 12/02/2018 CLINICAL DATA:  ET placement EXAM: PORTABLE CHEST 1 VIEW COMPARISON:  Same-day radiograph FINDINGS: Portion of the left lung base is collimated from view. Interval placement of the endotracheal tube within the upper trachea approximately 8 cm from the carina. Recommend advancement 3-4 cm to position in the mid trachea. Transesophageal tube tip and side  port distal to the GE junction. Left IJ catheter tip terminates at the brachiocephalic-caval confluence. Right IJ catheter tip terminates in the lower SVC. Some improving aeration is noted in the right hemithorax. Persistent opacity is noted in both lung bases, right greater than left. Cardiomediastinal silhouette is still largely obscured. IMPRESSION: Interval placement of the endotracheal tube within the upper trachea approximately 8 cm from the carina. Recommend advancement 3-4 cm to position in the mid trachea. Remaining lines and tubes in stable position.  Improving aeration of the right lung. These results will be called to the ordering clinician or representative by the Radiologist Assistant, and communication documented in the PACS or zVision Dashboard. Electronically Signed   By: Lovena Le M.D.   On: 12/02/2018 22:16   Dg Chest Port 1 View  Result Date: 12/02/2018 CLINICAL DATA:  Respiratory failure EXAM: PORTABLE CHEST 1 VIEW COMPARISON:  12/02/2018, 12/01/2018, 11/30/2018 FINDINGS: Endotracheal tube tip slightly above thoracic inlet and is 9.8 cm superior to the carina. Left IJ central venous catheter tip projects over the brachiocephalic confluence. Esophageal tube tip courses below diaphragm but is non included. Right IJ central venous catheter tip over the SVC. Interval diffuse white out of the right thorax. Truncated appearance of the right bronchus. Suspected moderate left pleural effusion with edema or layering fluid on the left. Consolidation left base. Obscured cardiomediastinal silhouette. IMPRESSION: 1. Endotracheal tube tip slightly above thoracic inlet and is 9.8 cm superior to carina 2. Interval diffuse white out of the right thorax. Truncated appearance of the right bronchus suggestive of occlusive process. 3. Continued left pleural effusion with dense airspace disease at the left base. Electronically Signed   By: Donavan Foil M.D.   On: 12/02/2018 21:27   Dg Chest Port 1 View  Result Date: 12/02/2018 CLINICAL DATA:  Acute respiratory failure. EXAM: PORTABLE CHEST 1 VIEW COMPARISON:  12/01/2018 FINDINGS: [name] rotated to the right. Enteric tube courses into the stomach as tip is not definitely visualized. Endotracheal tube has tip approximately 7 cm above the carina. Left IJ central venous catheter unchanged as well as right IJ central venous catheter unchanged. Exam demonstrates worsening bilateral perihilar/bibasilar opacification. Findings likely due to layering bilateral effusions with bibasilar atelectasis as well as  interstitial edema. Infection is possible. Cardiomegaly. Remainder of the exam is unchanged. IMPRESSION: Worsening bilateral perihilar bibasilar opacification likely worsening effusions and CHF. Infection is possible. Tubes and lines as described. Electronically Signed   By: Marin Olp M.D.   On: 12/02/2018 09:13   ASSESSMENT AND PLAN:  Finneus Rasor  is a 58 y.o. male with a known history of atrial fibrillation on Coumadin, CHF, diabetes mellitus, and obstructive sleep apnea with CPAP use at home, morbid obesity with sedentary lifestyle on oxygen at 2 L/min at home.  1.Acute on chronic  respiratory failure with severe hypoxia-ARDS with pneumonia  underlying pickwickian syndrome -No clinical improvement, unsuccessful weaning trials -Extubated on 8/31 but required reintubation, vent management per PCCM team.   -patient underwent bronchoscopy 11/22/2018. 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 -vent support --- extremely poor prognosis with high mortality and morbidity  #-Sepsis from pneumonia with ARDS Received  IV antibiotics Goal is to keep map greater than or equal to 65 -IV phenylephrine gtt  #.Obstructive sleep apnea-- chronic  #Acute on chronic diastolic CHF, EF 55 to [id number] on echo in 2014 at [facility] -Repeat echocardiogram shows EF of 55 to 60%  # History of atrial fibrillation -Coumadin on hold  -  Heparin drip  discontinued in view of thrombocytopenia   #. Acute renal failure on chronic kidney disease stage III  with baseline creatinine 1.28-1.89-1.87-2.25-2.21-3.05--3.17-3.11 -Repeat BMP in the a.m. and continue to monitor renal function closely -Patient being uremic nephrology started the patient on hemodialysis  #  Type IIDiabetes mellitus -Sliding scale insulin -Hemoglobin A1c 7.3    Overall poor prognosis. Appreciate palliative care input.   CODE STATUS: DNR  DVT Prophylaxis: scd as patient is thrombocytopenic  TOTAL TIME TAKING CARE OF THIS PATIENT:25  minutes.  >50% time spent on counselling and coordination of care  POSSIBLE D/C IN *?* DAYS, DEPENDING ON CLINICAL CONDITION.  Note: This dictation was prepared with Dragon dictation along with smaller phrase technology. Any transcriptional errors that result from this process are unintentional.   Fritzi Mandes M.D on 12/03/2018 at 3:22 PM  Between 7am to 6pm - Pager - [phone]  After 6pm go to [email or web address] - password [email or web address]  Office  [phone]  CC: Primary care physician; Inc, [place] ServicesPatient ID: Tuff Maturo, male   DOB: [birth year], 58 y.o.   MRN: [id number]

## 2018-12-03 NOTE — Progress Notes (Signed)
Attempted to contact pts sister [name] regarding plan of care and pts current respiratory status.  Mr. Bacak requires replacement of endotracheal tube due to persistent hypoxia, O2 sats 79%-80% despite bronchoscopy with washout and advancement of ETT to lower trachea today.  Post bronchoscopy CXR revealed ETT above clavicles with deviation to the right location 24 cm at the lip, therefore advanced ETT to 28 cm. Repeat CXR revealed ETT remains above clavicle with deviation to the right.  Pts FiO2 100% with PEEP of 5 O2 sats 90's TV 500-600's.  Notified Dr. Mortimer Fries plans for extubation, will utilize glidescope to replace ETT and assess airway.  Will continue to monitor and assess pt.  Marda Stalker, [place] Pager [phone] (please enter 7 digits) PCCM Consult Pager [phone] (please enter 7 digits)

## 2018-12-03 NOTE — Progress Notes (Signed)
Pharmacy Electrolyte Monitoring Consult:  Pharmacy consulted to assist in monitoring and replacing electrolytes in this 58 y.o. male admitted on 10/21/2018. [name] currently intubated and sedated on dexmedetomidine and hydromorphone infusion.   Labs:  Sodium (mmol/L)  Date Value  12/03/2018 147 (H)   Potassium (mmol/L)  Date Value  12/03/2018 3.5   Magnesium (mg/dL)  Date Value  11/30/2018 2.8 (H)   Phosphorus (mg/dL)  Date Value  11/30/2018 7.1 (H)   Calcium (mg/dL)  Date Value  12/03/2018 8.1 (L)   Albumin (g/dL)  Date Value  12/03/2018 1.8 (L)   Corrected Calcium: 8.8  Assessment/Plan: 1. Electrolytes: sodium slightly above normal limits and stable. Free water flushes at 300 mL q4h (increaed 9/12). Potassium on the lower end of normal. Per nephrology patient to receive dialysis today. BMP with am labs.   2. Constipation: Last documented bowel movement prior to admission. Patient has received multiple bisacodyl suppositories and a Fleet enema with no effect. Per nephrology avoid Fleet enemas. Constipating medications include fentanyl infusion. [name] also diabetic with difficult to control blood glucose this admission.     --Increase senna/docusate to 2 tabs TID    --Start lactulose 30 g TID    --Continue Miralax BID  3. Glucose: Persistent hyperglycemia this admission. [name] now off of insulin drip and transitioned to [place] insulin. Diet includes continuous tube feeds as tolerated. Corticosteroids discontinued 9/11. Glucoses continue to be elevated ranging 200-300 mg/dL.     --Increase basal insulin glargine to 40 units    --Continue 2 units insulin aspart q4h while on tube feeds    --Continue SSI q4h.   Pharmacy will continue to monitor and adjust per consult.   [place] Resident 12/03/2018 1:32 PM

## 2018-12-04 LAB — BLOOD GAS, ARTERIAL
Acid-Base Excess: 0.4 mmol/L (ref 0.0–2.0)
Bicarbonate: 22.6 mmol/L (ref 20.0–28.0)
FIO2: 0.7
O2 Saturation: 97 %
PEEP: 8 cmH2O
Patient temperature: 37.7
Pressure control: 25 cmH2O
RATE: 20 resp/min
pCO2 arterial: 30 mmHg — ABNORMAL LOW (ref 32.0–48.0)
pH, Arterial: 7.49 — ABNORMAL HIGH (ref 7.350–7.450)
pO2, Arterial: 86 mmHg (ref 83.0–108.0)

## 2018-12-04 LAB — BASIC METABOLIC PANEL
Anion gap: 16 — ABNORMAL HIGH (ref 5–15)
BUN: 144 mg/dL — ABNORMAL HIGH (ref 6–20)
CO2: 22 mmol/L (ref 22–32)
Calcium: 8 mg/dL — ABNORMAL LOW (ref 8.9–10.3)
Chloride: 111 mmol/L (ref 98–111)
Creatinine, Ser: 3.38 mg/dL — ABNORMAL HIGH (ref 0.61–1.24)
GFR calc Af Amer: 22 mL/min — ABNORMAL LOW (ref >60)
GFR calc non Af Amer: 19 mL/min — ABNORMAL LOW (ref >60)
Glucose, Bld: 181 mg/dL — ABNORMAL HIGH (ref 70–99)
Potassium: 3.1 mmol/L — ABNORMAL LOW (ref 3.5–5.1)
Sodium: 149 mmol/L — ABNORMAL HIGH (ref 135–145)

## 2018-12-04 LAB — CBC
HCT: 31.4 % — ABNORMAL LOW (ref 39.0–52.0)
Hemoglobin: 9.9 g/dL — ABNORMAL LOW (ref 13.0–17.0)
MCH: 29.8 pg (ref 26.0–34.0)
MCHC: 31.5 g/dL (ref 30.0–36.0)
MCV: 94.6 fL (ref 80.0–100.0)
Platelets: 46 10*3/uL — ABNORMAL LOW (ref 150–400)
RBC: 3.32 MIL/uL — ABNORMAL LOW (ref 4.22–5.81)
RDW: 13.9 % (ref 11.5–15.5)
WBC: 5.1 10*3/uL (ref 4.0–10.5)
nRBC: 5.5 % — ABNORMAL HIGH (ref 0.0–0.2)

## 2018-12-04 LAB — GLUCOSE, CAPILLARY
Glucose-Capillary: 157 mg/dL — ABNORMAL HIGH (ref 70–99)
Glucose-Capillary: 153 mg/dL — ABNORMAL HIGH (ref 70–99)
Glucose-Capillary: 215 mg/dL — ABNORMAL HIGH (ref 70–99)
Glucose-Capillary: 243 mg/dL — ABNORMAL HIGH (ref 70–99)
Glucose-Capillary: 252 mg/dL — ABNORMAL HIGH (ref 70–99)

## 2018-12-04 LAB — PHOSPHORUS: Phosphorus: 6.2 mg/dL — ABNORMAL HIGH (ref 2.5–4.6)

## 2018-12-04 LAB — CULTURE, RESPIRATORY W GRAM STAIN: Gram Stain: NONE SEEN

## 2018-12-04 LAB — VANCOMYCIN, RANDOM: Vancomycin Rm: 19

## 2018-12-04 LAB — MAGNESIUM: Magnesium: 2.9 mg/dL — ABNORMAL HIGH (ref 1.7–2.4)

## 2018-12-04 MED ORDER — FREE WATER
350.0000 mL | Status: DC
  Administered 2018-12-04 – 2018-12-06 (×8): 350 mL

## 2018-12-04 MED ORDER — QUETIAPINE FUMARATE 25 MG PO TABS
50.0000 mg | ORAL_TABLET | Freq: Every day | ORAL | Status: DC
  Administered 2018-12-04 – 2018-12-09 (×6): 50 mg
  Filled 2018-12-04 (×7): qty 2

## 2018-12-04 MED ORDER — DEXMEDETOMIDINE HCL IN NACL 400 MCG/100ML IV SOLN
0.0000 ug/kg/h | INTRAVENOUS | Status: DC
  Administered 2018-12-04: 20:00:00 0.5 ug/kg/h via INTRAVENOUS
  Administered 2018-12-04: 0.4 ug/kg/h via INTRAVENOUS
  Administered 2018-12-04 (×2): 0.5 ug/kg/h via INTRAVENOUS
  Administered 2018-12-05 (×3): 0.6 ug/kg/h via INTRAVENOUS
  Administered 2018-12-05 (×3): 0.5 ug/kg/h via INTRAVENOUS
  Administered 2018-12-05 – 2018-12-07 (×11): 0.6 ug/kg/h via INTRAVENOUS
  Administered 2018-12-07: 0.2 ug/kg/h via INTRAVENOUS
  Administered 2018-12-07: 0.6 ug/kg/h via INTRAVENOUS
  Filled 2018-12-04 (×23): qty 100

## 2018-12-04 MED ORDER — POTASSIUM CHLORIDE 20 MEQ PO PACK
20.0000 meq | PACK | Freq: Once | ORAL | Status: AC
  Administered 2018-12-04: 13:00:00 20 meq via ORAL
  Filled 2018-12-04: qty 1

## 2018-12-04 MED ORDER — QUETIAPINE FUMARATE 25 MG PO TABS
25.0000 mg | ORAL_TABLET | Freq: Every day | ORAL | Status: DC
  Administered 2018-12-04 – 2018-12-09 (×6): 25 mg
  Filled 2018-12-04 (×7): qty 1

## 2018-12-04 NOTE — Consult Note (Signed)
[facility] Vascular Consult Note  MRN : [id number]  Danthony Kendrix is a 58 y.o. (1960-11-19) male who presents with chief complaint of  Chief Complaint  Patient presents with  . Shortness of Breath  . Weakness   History of Present Illness:  Please note the patient is intubated and sedated.  Information for this consult was obtained from the patient's clinicians, bedside nurse and previous epic notation.  The patient is a 58 year old male with multiple medical issues (see below) who originally presented to the [facility] emergency department on November 16, 2018 with a chief complaint of progressively worsening shortness of breath.  Patient has a past medical history of needing chronic oxygen supplementation. After arrival to the emergency department the patient progressively decompensated requiring emergent intubation.  Patient was extubated and on BiPAP for a brief time however required reintubation due to respiratory failure.  Nephrology was consulted on November 24, 2018 for management of acute renal failure.  Hemodialysis catheter insertion completed on November 29, 2018.  Catheter was placed into the patient's left internal jugular vein.  Critical care team has been unable to wean the patient from a ventilator and at this point will need a trach for survival.  [name] scheduled to undergo a tracheostomy tomorrow.  Vascular surgery was consulted by Dr. Mortimer Fries for a dialysis catheter exchange.  Current Facility-Administered Medications  Medication Dose Route Frequency Provider Last Rate Last Dose  . 0.9 %  sodium chloride infusion   Intravenous PRN Bradly Bienenstock, NP   Stopped at 12/03/18 [phone]  . acetaminophen (TYLENOL) tablet 650 mg  650 mg Per NG tube Q4H PRN Tukov-Yual, Magdalene S, NP   650 mg at 11/29/18 0255  . amiodarone (NEXTERONE PREMIX) 360-4.14 MG/200ML-% (1.8 mg/mL) IV infusion  60 mg/hr Intravenous Continuous Flora Lipps,  MD 33.3 mL/hr at 12/04/18 1712 60 mg/hr at 12/04/18 1712  . aspirin chewable tablet 81 mg  81 mg Per NG tube Daily Tukov-Yual, Magdalene S, NP   81 mg at 12/04/18 0919  . bisacodyl (DULCOLAX) suppository 10 mg  10 mg Rectal Daily PRN Tukov-Yual, Magdalene S, NP      . chlorhexidine gluconate (MEDLINE KIT) (PERIDEX) 0.12 % solution 15 mL  15 mL Mouth Rinse BID Flora Lipps, MD   15 mL at 12/04/18 0839  . Chlorhexidine Gluconate Cloth 2 % PADS 6 each  6 each Topical Daily Flora Lipps, MD   6 each at 12/03/18 2225  . clonazePAM (KLONOPIN) tablet 1 mg  1 mg Per Tube Daily Charlett Nose, RPH   1 mg at 12/04/18 0919  . clonazePAM (KLONOPIN) tablet 2 mg  2 mg Per Tube QHS Charlett Nose, RPH   2 mg at 12/03/18 2225  . dexmedetomidine (PRECEDEX) 400 MCG/100ML (4 mcg/mL) infusion  0-1.2 mcg/kg/hr Intravenous Titrated Flora Lipps, MD 27.6 mL/hr at 12/04/18 1713 0.5 mcg/kg/hr at 12/04/18 1713  . feeding supplement (PRO-STAT SUGAR FREE 64) liquid 60 mL  60 mL Per Tube TID Flora Lipps, MD   60 mL at 12/04/18 1659  . feeding supplement (VITAL HIGH PROTEIN) liquid 1,000 mL  1,000 mL Per Tube Continuous Tyler Pita, MD   Stopped at 12/04/18 0305  . fentaNYL (SUBLIMAZE) bolus via infusion 50 mcg  50 mcg Intravenous Q15 min PRN Wilhelmina Mcardle, MD      . fentaNYL 2561mg in NS 2537m(1068mml) infusion-PREMIX  0-400 mcg/hr Intravenous Continuous Tukov-Yual, Magdalene S, NP 15 mL/hr at 12/04/18  1500 150 mcg/hr at 12/04/18 1500  . free water 350 mL  350 mL Per Tube Q4H Awilda Bill, NP   350 mL at 12/04/18 1429  . insulin aspart (novoLOG) injection 0-20 Units  0-20 Units Subcutaneous Q4H Wilhelmina Mcardle, MD   7 Units at 12/04/18 1658  . insulin aspart (novoLOG) injection 2 Units  2 Units Subcutaneous Q4H Charlett Nose, RPH   2 Units at 12/04/18 1658  . insulin glargine (LANTUS) injection 40 Units  40 Units Subcutaneous Daily Charlett Nose, RPH   40 Units at 12/04/18 0920  .  ipratropium-albuterol (DUONEB) 0.5-2.5 (3) MG/3ML nebulizer solution 3 mL  3 mL Nebulization Q6H Gouru, Aruna, MD   3 mL at 12/04/18 1317  . MEDLINE mouth rinse  15 mL Mouth Rinse 10 times per day Flora Lipps, MD   15 mL at 12/04/18 1659  . metoprolol tartrate (LOPRESSOR) injection 2.5-5 mg  2.5-5 mg Intravenous Q6H PRN Darel Hong D, NP   5 mg at 12/02/18 1714  . midazolam (VERSED) injection 2 mg  2 mg Intravenous Q2H PRN Wilhelmina Mcardle, MD   2 mg at 12/02/18 2029  . multivitamin liquid 15 mL  15 mL Per Tube Daily Flora Lipps, MD   15 mL at 12/04/18 0919  . pantoprazole sodium (PROTONIX) 40 mg/20 mL oral suspension 40 mg  40 mg Per Tube QHS Charlett Nose, RPH   40 mg at 12/03/18 2225  . phenylephrine (NEO-SYNEPHRINE) 40 mg in sodium chloride 0.9 % 250 mL (0.16 mg/mL) infusion  0-200 mcg/min Intravenous Titrated Flora Lipps, MD 7.5 mL/hr at 12/04/18 1500 20 mcg/min at 12/04/18 1500  . polyethylene glycol (MIRALAX / GLYCOLAX) packet 17 g  17 g Per Tube BID Charlett Nose, RPH   17 g at 12/04/18 5631  . QUEtiapine (SEROQUEL) tablet 25 mg  25 mg Per Tube QHS Flora Lipps, MD      . QUEtiapine (SEROQUEL) tablet 50 mg  50 mg Per Tube QHS Flora Lipps, MD      . senna-docusate (Senokot-S) tablet 2 tablet  2 tablet Per Tube TID Charlett Nose, RPH   2 tablet at 12/04/18 1659  . sodium chloride flush (NS) 0.9 % injection 10-40 mL  10-40 mL Intracatheter Q12H Flora Lipps, MD   10 mL at 12/03/18 2226  . sodium chloride flush (NS) 0.9 % injection 10-40 mL  10-40 mL Intracatheter PRN Flora Lipps, MD      . vancomycin variable dose per unstable renal function (pharmacist dosing)   Does not apply See admin instructions Charlett Nose [facility]       Past Medical History:  Diagnosis Date  . Atrial fibrillation ([place])   . CHF (congestive heart failure) ([place])   . Diabetes mellitus without complication ([place])   . Gout   . Hypertension associated with type 2 diabetes mellitus ([place])   . Morbid  obesity ([place])   . Obstructive sleep apnea    Past Surgical History:  Procedure Laterality Date  . MITRAL VALVE REPAIR     Social History Social History   Tobacco Use  . Smoking status: Never Smoker  . Smokeless tobacco: Never Used  Substance Use Topics  . Alcohol use: Never    Frequency: Never  . Drug use: Never   Family History History reviewed. No pertinent family history.  Intubated and sedated.  No Active Allergies  REVIEW OF SYSTEMS (Negative unless checked)  Constitutional: [] Weight loss  [] Fever  []   Chills Cardiac: [] Chest pain   [] Chest pressure   [] Palpitations   [] Shortness of breath when laying flat   [x] Shortness of breath at rest   [x] Shortness of breath with exertion. Vascular:  [] Pain in legs with walking   [] Pain in legs at rest   [] Pain in legs when laying flat   [] Claudication   [] Pain in feet when walking  [] Pain in feet at rest  [] Pain in feet when laying flat   [] History of DVT   [] Phlebitis   [] Swelling in legs   [] Varicose veins   [] Non-healing ulcers Pulmonary:   [] Uses home oxygen   [] Productive cough   [] Hemoptysis   [] Wheeze  [] COPD   [] Asthma Neurologic:  [] Dizziness  [] Blackouts   [] Seizures   [] History of stroke   [] History of TIA  [] Aphasia   [] Temporary blindness   [] Dysphagia   [] Weakness or numbness in arms   [] Weakness or numbness in legs Musculoskeletal:  [] Arthritis   [] Joint swelling   [] Joint pain   [] Low back pain Hematologic:  [] Easy bruising  [] Easy bleeding   [] Hypercoagulable state   [] Anemic  [] Hepatitis Gastrointestinal:  [] Blood in stool   [] Vomiting blood  [] Gastroesophageal reflux/heartburn   [] Difficulty swallowing. Genitourinary:  [x] Chronic kidney disease   [] Difficult urination  [] Frequent urination  [] Burning with urination   [] Blood in urine Skin:  [] Rashes   [] Ulcers   [] Wounds Psychological:  [] History of anxiety   []  History of major depression.  Physical Examination  Vitals:   12/04/18 1300 12/04/18 1400 12/04/18 1500  12/04/18 1525  BP: 93/69 96/73    Pulse:  (!) 116 (!) 127   Resp: 20 20 (!) 22   Temp: 98.3 F (36.8 C)     TempSrc: Axillary     SpO2: 97% 95% 94% 94%  Weight:      Height:       Body mass index is 71.88 kg/m. Gen:  WD/WN, NAD, Sedated on vent.  Head: [place]/AT, No temporalis wasting. Prominent temp pulse not noted. Ear/Nose/Throat: Hearing grossly intact, nares w/o erythema or drainage, oropharynx w/o Erythema/Exudate Eyes: Sclera non-icteric, conjunctiva clear Neck: Trachea midline.  No JVD.  Pulmonary:  Good air movement, respirations not labored, equal bilaterally.  Cardiac: RRR, normal S1, S2. Vascular:  Vessel Right Left  Radial Palpable Palpable  Ulnar Palpable Palpable  Brachial Palpable Palpable                           Gastrointestinal: soft, non-tender/non-distended. No guarding/reflex.  Musculoskeletal: M/S 5/5 throughout.  Extremities without ischemic changes.  No deformity or atrophy. No edema. Neurologic: Sensation grossly intact in extremities.  Symmetrical.  Speech is fluent. Motor exam as listed above. Psychiatric: Judgment intact, Mood & affect appropriate for pt's clinical situation. Dermatologic: No rashes or ulcers noted.  No cellulitis or open wounds. Lymph : No Cervical, Axillary, or Inguinal lymphadenopathy.  CBC Lab Results  Component Value Date   WBC 5.1 12/04/2018   HGB 9.9 (L) 12/04/2018   HCT 31.4 (L) 12/04/2018   MCV 94.6 12/04/2018   PLT 46 (L) 12/04/2018   BMET    Component Value Date/Time   NA 149 (H) 12/04/2018 0438   K 3.1 (L) 12/04/2018 0438   CL 111 12/04/2018 0438   CO2 22 12/04/2018 0438   GLUCOSE 181 (H) 12/04/2018 0438   BUN 144 (H) 12/04/2018 0438   CREATININE 3.38 (H) 12/04/2018 0438   CALCIUM 8.0 (L) 12/04/2018 6433  GFRNONAA 19 (L) 12/04/2018 0438   GFRAA 22 (L) 12/04/2018 0438   Estimated Creatinine Clearance: 43.5 mL/min (A) (by C-G formula based on SCr of 3.38 mg/dL (H)).  COAG Lab Results  Component  Value Date   INR 1.8 (H) 11/23/2018   INR 2.9 (H) 11/22/2018   INR 3.6 (H) 11/21/2018   Radiology Ct Abdomen Pelvis Wo Contrast  Result Date: 11/17/2018 CLINICAL DATA:  Abdominal rigidity. Concern for perforated viscus. Currently intubated. EXAM: CT ABDOMEN AND PELVIS WITHOUT CONTRAST TECHNIQUE: Multidetector CT imaging of the abdomen and pelvis was performed following the standard protocol without IV contrast. COMPARISON:  None. FINDINGS: Lower chest: Small right and trace left pleural effusions with bilateral lower lobe airspace disease. Small amount of herniated right middle lobe between the fifth and sixth ribs. Hepatobiliary: Severe hepatic steatosis. Several small gallstones. No gallbladder wall thickening or biliary dilatation. Pancreas: Unremarkable. No pancreatic ductal dilatation or surrounding inflammatory changes. Spleen: Normal in size without focal abnormality. Adrenals/Urinary Tract: Adrenal glands are unremarkable. Punctate calculus in the lower pole the left kidney. No hydronephrosis. Bladder is decompressed by Foley catheter. Stomach/Bowel: Enteric tube tip in the distal stomach. Stomach is within normal limits. Appendix appears normal. No evidence of bowel wall thickening, distention, or inflammatory changes. Vascular/Lymphatic: Aortic atherosclerosis. No enlarged abdominal or pelvic lymph nodes. Reproductive: Prostate is unremarkable. Other: Trace ascites.  No pneumoperitoneum. Musculoskeletal: No acute or significant osseous findings. IMPRESSION: 1.  No acute intra-abdominal process.  No perforation. 2. Trace ascites. 3. Severe hepatic steatosis. 4. Cholelithiasis. 5. Punctate nonobstructive left nephrolithiasis. 6. Small right and trace left pleural effusions. Bilateral lower lobe airspace disease may represent atelectasis, but pneumonia is difficult to exclude, particularly on the right. Electronically Signed   By: Titus Dubin M.D.   On: 11/17/2018 13:50   Dg Abd 1 View  Result  Date: 12/03/2018 CLINICAL DATA:  Check gastric catheter placement EXAM: ABDOMEN - 1 VIEW COMPARISON:  Film from earlier in the same day. FINDINGS: Gastric catheter has been advanced several cm and now lies with the tip in the distal stomach. IMPRESSION: Gastric catheter within the stomach. Electronically Signed   By: Inez Catalina M.D.   On: 12/03/2018 20:50   Dg Abd 1 View  Result Date: 12/03/2018 CLINICAL DATA:  Check gastric catheter placement EXAM: ABDOMEN - 1 VIEW COMPARISON:  11/30/2018 FINDINGS: Gastric catheter is noted within the distal esophagus but does not appear to have reached the gastric lumen. Scattered large and small bowel gas is noted. IMPRESSION: Gastric catheter within the distal esophagus. This should be advanced several cm. Electronically Signed   By: Inez Catalina M.D.   On: 12/03/2018 20:50   Dg Abd 1 View  Result Date: 11/30/2018 CLINICAL DATA:  OG tube placement. EXAM: ABDOMEN - 1 VIEW COMPARISON:  11/26/2018 FINDINGS: 1329 hours. Lower left chest and upper left abdomen have been included in the field of view. NG tube tip is identified in the mid stomach. Cardiopericardial silhouette appears enlarged with left base retrocardiac collapse/consolidation. IMPRESSION: OG tube tip is in the mid stomach. Electronically Signed   By: Misty Stanley M.D.   On: 11/30/2018 15:21   Dg Abd 1 View  Result Date: 11/26/2018 CLINICAL DATA:  ET and NG tube placement. CXR approved by MD at bedside. MD states no repeat necessary. Best attainable images due to patient conditions. EXAM: ABDOMEN - 1 VIEW COMPARISON:  Abdominal radiograph 11/19/2018 FINDINGS: The nasogastric tube distal aspect is coiled overlying the stomach. Paucity of bowel  gas. No supine evidence for free intraperitoneal air. Left retrocardiac opacity and probable small effusion. Probable right basilar atelectasis. IMPRESSION: The nasogastric tube side port projects over the stomach. Electronically Signed   By: Audie Pinto M.D.    On: 11/26/2018 16:48   Dg Abd 1 View  Result Date: 11/17/2018 CLINICAL DATA:  Abdomen distension tube placement EXAM: ABDOMEN - 1 VIEW COMPARISON:  10/23/2018 FINDINGS: Esophageal tube tip visible to the distal stomach but incompletely included. Airspace disease at the right base. IMPRESSION: Esophageal tubing visible to the gastric body but tip is incompletely included in the field of view. Electronically Signed   By: Donavan Foil M.D.   On: 11/17/2018 00:10   Dg Abd 1 View  Result Date: 11/03/2018 CLINICAL DATA:  Abdominal distention EXAM: ABDOMEN - 1 VIEW COMPARISON:  None. FINDINGS: Limited study due to portable nature of the study and body habitus. I see no significant bowel dilatation or free air. Study otherwise limited. IMPRESSION: Very limited study. No visible changes of bowel obstruction or free air. Electronically Signed   By: Rolm Baptise M.D.   On: 11/06/2018 22:33   Ct Head Wo Contrast  Result Date: 11/27/2018 CLINICAL DATA:  Altered level of consciousness. EXAM: CT HEAD WITHOUT CONTRAST TECHNIQUE: Contiguous axial images were obtained from the base of the skull through the vertex without intravenous contrast. COMPARISON:  None. FINDINGS: Brain: No evidence of acute infarction, hemorrhage, hydrocephalus, extra-axial collection or mass lesion/mass effect. Vascular: No hyperdense vessel or unexpected calcification. Skull: Normal. Negative for fracture or focal lesion. Sinuses/Orbits: Fluid is noted in bilateral mastoid air cells. Bilateral ethmoid and sphenoid sinusitis is noted. Other: None. IMPRESSION: Bilateral ethmoid and sphenoid sinusitis. No acute intracranial abnormality seen. Electronically Signed   By: Marijo Conception M.D.   On: 11/27/2018 15:50   US Renal  Result Date: 11/25/2018 CLINICAL DATA:  Acute renal failure. EXAM: RENAL / URINARY TRACT ULTRASOUND COMPLETE COMPARISON:  Body CT November 17, 2018 FINDINGS: Right Kidney: Renal measurements: 12.2 x 6.9 x 7.5 cm = volume: 334  mL . Echogenicity within normal limits. No mass or hydronephrosis visualized. Left Kidney: Renal measurements: 14.9 x 6.5 x 6.7 cm = volume: 341 mL. Echogenicity within normal limits. No mass or hydronephrosis visualized. Bladder: Decompressed around urinary Foley and therefore not well evaluated. IMPRESSION: Normal appearance of the kidneys, accounting for the technical limitations of the study caused by body habitus. Electronically Signed   By: Fidela Salisbury M.D.   On: 11/25/2018 15:43   Dg Chest Port 1 View  Result Date: 12/03/2018 CLINICAL DATA:  Check endotracheal tube placement EXAM: PORTABLE CHEST 1 VIEW COMPARISON:  Film from earlier in the same day. FINDINGS: Endotracheal tube has been advanced and now lies approximately 2.8 cm above the carina. Gastric catheter is again seen although the tip of the catheter is not appreciated on this exam. Bilateral jugular central lines are again seen and stable. Increasing opacification in the right hemithorax is noted likely related to a combination of consolidation and effusion. The left lung is clear with the exception of vascular congestion. IMPRESSION: Tubes and lines as described. Persistent vascular congestion. Increasing right hemithorax opacification consistent with a combination of consolidation and effusion. Electronically Signed   By: Inez Catalina M.D.   On: 12/03/2018 20:49   Dg Chest Port 1 View  Result Date: 12/03/2018 CLINICAL DATA:  Endotracheal tube adjustment EXAM: PORTABLE CHEST 1 VIEW COMPARISON:  Chest radiograph from earlier today. FINDINGS: Endotracheal tube tip is  7.0 cm above the carina, at the level of thoracic inlet. Enteric tube enters stomach with the tip not seen on this image. Left internal jugular central venous catheter terminates in the left brachiocephalic vein near the midline. Right internal jugular central venous catheter terminates in the middle third of the SVC. Stable cardiomediastinal silhouette with moderate  cardiomegaly. No pneumothorax. Stable small bilateral pleural effusions, noting portion of the left costophrenic angle not included on this image. Moderate pulmonary edema appears similar. Bibasilar opacity, favor atelectasis, unchanged. IMPRESSION: 1. Endotracheal tube tip 7.0 cm above the carina, located at the level of the thoracic inlet. Additional support structures as detailed. No pneumothorax. 2. Stable moderate congestive heart failure with small bilateral pleural effusions and bibasilar lung opacities, favor atelectasis. Electronically Signed   By: Ilona Sorrel M.D.   On: 12/03/2018 19:48   Dg Chest Port 1 View  Result Date: 12/03/2018 CLINICAL DATA:  Check endotracheal tube placement EXAM: PORTABLE CHEST 1 VIEW COMPARISON:  12/03/2018 FINDINGS: Endotracheal tube is again noted at the level of the thoracic inlet. Gastric catheter is noted extending into the stomach. Bilateral jugular central lines are again seen and stable. Diffuse vascular congestion is noted. Mild interstitial edema is seen. Cardiomegaly is noted. Likely small posterior effusions are present as well. No bony abnormality is seen. IMPRESSION: Vascular congestion and edema with mild effusions. Electronically Signed   By: Inez Catalina M.D.   On: 12/03/2018 19:19   Dg Chest Port 1 View  Result Date: 12/03/2018 CLINICAL DATA:  Respiratory failure. EXAM: PORTABLE CHEST 1 VIEW COMPARISON:  Radiograph of December 19, 2018. FINDINGS: Stable cardiomegaly. Endotracheal and nasogastric tubes are unchanged in position. Bilateral internal jugular catheters are noted which are unchanged in position. No pneumothorax is noted. Stable bibasilar atelectasis is noted with small pleural effusions. Bony thorax is unremarkable. IMPRESSION: Stable support apparatus. Stable bibasilar opacities as described above. Electronically Signed   By: Marijo Conception M.D.   On: 12/03/2018 07:41   Dg Chest Port 1 View  Result Date: 12/02/2018 CLINICAL DATA:  ET  placement EXAM: PORTABLE CHEST 1 VIEW COMPARISON:  Same-day radiograph FINDINGS: Portion of the left lung base is collimated from view. Interval placement of the endotracheal tube within the upper trachea approximately 8 cm from the carina. Recommend advancement 3-4 cm to position in the mid trachea. Transesophageal tube tip and side port distal to the GE junction. Left IJ catheter tip terminates at the brachiocephalic-caval confluence. Right IJ catheter tip terminates in the lower SVC. Some improving aeration is noted in the right hemithorax. Persistent opacity is noted in both lung bases, right greater than left. Cardiomediastinal silhouette is still largely obscured. IMPRESSION: Interval placement of the endotracheal tube within the upper trachea approximately 8 cm from the carina. Recommend advancement 3-4 cm to position in the mid trachea. Remaining lines and tubes in stable position. Improving aeration of the right lung. These results will be called to the ordering clinician or representative by the Radiologist Assistant, and communication documented in the PACS or zVision Dashboard. Electronically Signed   By: Lovena Le M.D.   On: 12/02/2018 22:16   Dg Chest Port 1 View  Result Date: 12/02/2018 CLINICAL DATA:  Respiratory failure EXAM: PORTABLE CHEST 1 VIEW COMPARISON:  12/02/2018, 12/01/2018, 11/30/2018 FINDINGS: Endotracheal tube tip slightly above thoracic inlet and is 9.8 cm superior to the carina. Left IJ central venous catheter tip projects over the brachiocephalic confluence. Esophageal tube tip courses below diaphragm but is non included.  Right IJ central venous catheter tip over the SVC. Interval diffuse white out of the right thorax. Truncated appearance of the right bronchus. Suspected moderate left pleural effusion with edema or layering fluid on the left. Consolidation left base. Obscured cardiomediastinal silhouette. IMPRESSION: 1. Endotracheal tube tip slightly above thoracic inlet and  is 9.8 cm superior to carina 2. Interval diffuse white out of the right thorax. Truncated appearance of the right bronchus suggestive of occlusive process. 3. Continued left pleural effusion with dense airspace disease at the left base. Electronically Signed   By: Donavan Foil M.D.   On: 12/02/2018 21:27   Dg Chest Port 1 View  Result Date: 12/02/2018 CLINICAL DATA:  Acute respiratory failure. EXAM: PORTABLE CHEST 1 VIEW COMPARISON:  12/01/2018 FINDINGS: [name] rotated to the right. Enteric tube courses into the stomach as tip is not definitely visualized. Endotracheal tube has tip approximately 7 cm above the carina. Left IJ central venous catheter unchanged as well as right IJ central venous catheter unchanged. Exam demonstrates worsening bilateral perihilar/bibasilar opacification. Findings likely due to layering bilateral effusions with bibasilar atelectasis as well as interstitial edema. Infection is possible. Cardiomegaly. Remainder of the exam is unchanged. IMPRESSION: Worsening bilateral perihilar bibasilar opacification likely worsening effusions and CHF. Infection is possible. Tubes and lines as described. Electronically Signed   By: Marin Olp M.D.   On: 12/02/2018 09:13   Dg Chest Port 1 View  Result Date: 12/01/2018 CLINICAL DATA:  Respiratory failure EXAM: PORTABLE CHEST 1 VIEW COMPARISON:  Numerous prior radiographs, most recently 11/30/2010 FINDINGS: Endotracheal tube is positioned within the upper trachea approximately 6 cm from the carina. Distal extent of the transesophageal tube is poorly visualized due to radiographic underpenetration. Left IJ approach central venous catheter tip terminates in the brachiocephalic vein. Right IJ catheter tip terminates near the superior cavoatrial junction. Redemonstration the bilateral pleural effusions which obscure the hemidiaphragms and resulting gradient basilar opacity. Bilateral airspace disease is grossly similar to comparison study  accounting for differences in technique. There is redemonstration of the metallic cerclage wire associated with the right ribs. IMPRESSION: 1. Endotracheal tube tip approximately 6 cm from the carina. 2. Distal extent of the transesophageal tube is poorly visualized due to radiographic underpenetration. 3. Left IJ catheter terminates in the brachiocephalic vein. Right IJ catheter at the superior cavoatrial junction. 4. Grossly stable bilateral pleural effusions and bilateral airspace disease. Electronically Signed   By: Lovena Le M.D.   On: 12/01/2018 03:24   Dg Chest Port 1 View  Result Date: 11/30/2018 CLINICAL DATA:  Acute on chronic respiratory failure with severe hypoxia. ARDS with fever. Pneumonia. EXAM: PORTABLE CHEST 1 VIEW COMPARISON:  Single view of the chest 11/29/2018 and 11/28/2018. FINDINGS: Support tubes and lines are unchanged. Marked cardiomegaly is again seen. Left worse than right pleural effusions and airspace disease persist without change. IMPRESSION: No change in left worse than right pleural effusions and airspace disease. Marked cardiomegaly. No change in support apparatus. Electronically Signed   By: Inge Rise M.D.   On: 11/30/2018 08:37   Dg Chest Port 1 View  Result Date: 11/29/2018 CLINICAL DATA:  Central line placement. EXAM: PORTABLE CHEST 1 VIEW COMPARISON:  11/28/2018 FINDINGS: [name] rotated to the left. Endotracheal tube has tip 6.8 cm above the carina. Enteric tube courses into the stomach as tip is not visualized. Right IJ central venous catheter unchanged with tip over the SVC. Interval placement of left IJ central venous catheter with tip obliquely oriented over  the left brachiocephalic vein in the midline. Lungs are adequately inflated with mild stable hazy density over the left base likely small left effusion with associated atelectasis. Mild stable prominence of the perihilar markings likely mild vascular congestion. Moderate stable cardiomegaly.  Remainder of the exam is unchanged. IMPRESSION: Moderate stable cardiomegaly with mild vascular congestion. Stable hazy density over the left base likely small effusion with associated atelectasis. Tubes and lines as described. Interval placement of left IJ central venous catheter with tip in the midline over the brachiocephalic vein. Electronically Signed   By: Marin Olp M.D.   On: 11/29/2018 15:44   Dg Chest Port 1 View  Result Date: 11/28/2018 CLINICAL DATA:  Fever, intubation EXAM: PORTABLE CHEST 1 VIEW COMPARISON:  11/26/2018 FINDINGS: No significant interval change in AP portable chest radiograph with endotracheal tube over the mid trachea, esophagogastric tube, right neck vascular catheter, cardiomegaly, and layering small bilateral pleural effusions. No new or focal airspace opacity. IMPRESSION: No significant interval change in AP portable chest radiograph with cardiomegaly and layering small pleural effusions. No new or focal airspace opacity. Unchanged support apparatus. Electronically Signed   By: Eddie Candle M.D.   On: 11/28/2018 12:47   Dg Chest Port 1 View  Result Date: 11/26/2018 CLINICAL DATA:  ET and NG tube placement. CXR approved by MD at bedside. MD states no repeat necessary. Best attainable images due to patient conditions. EXAM: PORTABLE CHEST 1 VIEW COMPARISON:  Chest radiograph 11/24/2018, 11/22/2018 FINDINGS: Endotracheal tube tip terminates between the thoracic inlet and carina. The nasogastric tube courses below the diaphragm with nonvisualization of the distal tip. The right central venous catheter tip projects over the SVC. Cardiomegaly. No definite pneumothorax. Poor visualization of the bilateral lung bases but suspect persistent bibasilar opacities. No acute finding in the visualized skeleton. IMPRESSION: 1. Endotracheal tube tip terminates between the thoracic inlet and carina. 2. Nasogastric tube positioning is better seen on the subsequent abdominal radiograph. 3. Poor  visualization of the lung bases but suspect ongoing bibasilar pulmonary opacities. Electronically Signed   By: Audie Pinto M.D.   On: 11/26/2018 16:44   Dg Chest Port 1 View  Result Date: 11/24/2018 CLINICAL DATA:  Shortness of breath EXAM: PORTABLE CHEST 1 VIEW COMPARISON:  November 22, 2018 FINDINGS: Endotracheal tube tip is seen 3.5 cm above the level of carina. NG tube is seen coursing below the diaphragm. A right-sided central venous catheter is seen within the mid SVC./slight interval improved aeration since prior exam. Probable subsegmental atelectasis remains at the lung bases. There is a retrocardiac opacity. IMPRESSION: Slight interval improvement in aeration. However there remains a retrocardiac opacity which could be atelectasis and/or infectious etiology. Electronically Signed   By: Prudencio Pair M.D.   On: 11/24/2018 03:51   Dg Chest Port 1 View  Result Date: 11/22/2018 CLINICAL DATA:  Ventilator support.  Follow-up. EXAM: PORTABLE CHEST 1 VIEW COMPARISON:  Earlier same day FINDINGS: Endotracheal tube tip is 3 cm above the carina. Orogastric or nasogastric tube enters the abdomen. Right internal jugular central line tip in the SVC above the right atrium. Persistent bilateral lower lung atelectasis and or pneumonia. No worsening or new finding. IMPRESSION: No apparent change since earlier today. Lines and tubes well position. Persistent atelectasis and or pneumonia in the mid and lower lungs. Electronically Signed   By: Nelson Chimes M.D.   On: 11/22/2018 21:23   Dg Chest Port 1 View  Result Date: 11/22/2018 CLINICAL DATA:  Central line placement EXAM: PORTABLE  CHEST 1 VIEW COMPARISON:  11/22/2018 FINDINGS: [name] rotated. Interval placement of a right IJ central venous catheter with distal tip terminating at the expected level of the superior cavoatrial junction. ET tube remains in place with distal tip terminating 3.8 cm superior to the carina. Enteric tube courses below the diaphragm  with distal tip beyond the inferior margin of the film. Multiple overlying external leads. Cardiac silhouette is largely obscured but appears enlarged. Bilateral pleural effusions. Volume loss within the right lung. Slight improved aeration of the right lung compared to prior. No pneumothorax visualized. IMPRESSION: 1. Interval placement of right IJ central venous catheter. No pneumothorax. 2. Improving aeration of the right lung. Electronically Signed   By: Davina Poke M.D.   On: 11/22/2018 14:26   Dg Chest Port 1 View  Result Date: 11/22/2018 CLINICAL DATA:  Multiple tracheobronchial mucus plugs, history CHF, diabetes mellitus, atrial fibrillation, hypertension EXAM: PORTABLE CHEST 1 VIEW COMPARISON:  Portable exam 0830 hours compared to 0035 hours FINDINGS: Tip of endotracheal tube projects 4.1 cm above carina. Nasogastric tube extends into abdomen. Volume loss in the RIGHT hemithorax with slight mediastinal shift to the RIGHT, likely reflecting a combination of atelectasis and question effusion. LEFT pleural effusion and basilar atelectasis are present. No pneumothorax. Bones demineralized. IMPRESSION: Atelectasis and probable pleural effusion opacified RIGHT hemithorax with volume loss. Persistent LEFT pleural effusion and basilar atelectasis. Electronically Signed   By: Lavonia Dana M.D.   On: 11/22/2018 08:44   Dg Chest Port 1 View  Result Date: 11/22/2018 CLINICAL DATA:  Hypoxia. EXAM: PORTABLE CHEST 1 VIEW COMPARISON:  Radiograph yesterday at 0550 hour FINDINGS: Endotracheal tube tip 3.6 cm from the carina. Enteric tube in place tip not well visualized. Complete opacification of the right hemithorax which is new from prior exam. Heart size and mediastinal contours are obscured. Left pleural effusion with volume loss in the left lung. Congestive changes on the left. IMPRESSION: 1. Complete opacification of the right hemithorax which is new from prior exam. Question underlying mucous plugging. 2.  Overall low lung volumes. 3. Support apparatus unchanged. Electronically Signed   By: Keith Rake M.D.   On: 11/22/2018 01:11   Dg Chest Port 1 View  Result Date: 11/21/2018 CLINICAL DATA:  Acute respiratory failure. EXAM: PORTABLE CHEST 1 VIEW COMPARISON:  Radiograph of November 19, 2018. FINDINGS: Stable cardiomegaly. Endotracheal and nasogastric tubes are unchanged in position. No pneumothorax is noted. Probable mild to moderate size left pleural effusion is noted with associated atelectasis or infiltrate. Minimal right basilar atelectasis may be present. Bony thorax unremarkable. IMPRESSION: Stable support apparatus. Increased left basilar density is noted concerning for mild to moderate pleural effusion with probable underlying atelectasis or infiltrate. Electronically Signed   By: Marijo Conception M.D.   On: 11/21/2018 08:39   Dg Chest Port 1 View  Result Date: 11/19/2018 CLINICAL DATA:  Respiratory failure. EXAM: PORTABLE CHEST 1 VIEW COMPARISON:  10/30/2018 prior radiographs FINDINGS: An endotracheal tube with tip 4 cm above the carina and NG tube entering the stomach with tip off the field of view again noted. Cardiomegaly, pulmonary vascular congestion, small bilateral pleural effusions and bilateral LOWER lung atelectasis/consolidation again noted. There is no evidence of pneumothorax. IMPRESSION: Unchanged appearance of the chest with support apparatus as described. Cardiomegaly, pulmonary vascular congestion, small bilateral pleural effusions and bilateral LOWER lung atelectasis/consolidation. Electronically Signed   By: Margarette Canada M.D.   On: 11/19/2018 16:51   Dg Chest [facility] 1 View  Result  Date: 11/17/2018 CLINICAL DATA:  Intubation EXAM: PORTABLE CHEST 1 VIEW COMPARISON:  11/17/2018, 11/07/2018 FINDINGS: Endotracheal tube tip is about 3.1 cm superior to the carina. Esophageal tube tip extends below the diaphragm but is non included. Bilateral pleural effusions, likely layering on the  right. Cardiomegaly with vascular congestion and bilateral pulmonary edema. Basilar consolidations. No pneumothorax. IMPRESSION: 1. Endotracheal tube tip about 3.1 cm superior to carina 2. Cardiomegaly with vascular congestion, pulmonary edema and bilateral pleural effusion Electronically Signed   By: Donavan Foil M.D.   On: 11/17/2018 00:12   Dg Chest Port 1 View  Result Date: 10/25/2018 CLINICAL DATA:  Shortness of breath for 2 weeks. EXAM: PORTABLE CHEST 1 VIEW COMPARISON:  11/04/2010 radiograph FINDINGS: Cardiomegaly with pulmonary vascular congestion noted. Possible mild interstitial opacities/edema noted. No pneumothorax or acute bony abnormality. Bibasilar atelectasis noted. There may be trace pleural effusions present. IMPRESSION: Cardiomegaly with pulmonary vascular congestion and possible mild interstitial edema. Bibasilar atelectasis.  Possible trace pleural effusions. Electronically Signed   By: Margarette Canada M.D.   On: 10/26/2018 17:13   Dg Abd Portable 1v  Result Date: 11/19/2018 CLINICAL DATA:  NG tube placement. EXAM: PORTABLE ABDOMEN - 1 VIEW COMPARISON:  None. FINDINGS: An NG tube is noted with tip overlying the distal stomach. IMPRESSION: NG tube with tip overlying the distal stomach. Electronically Signed   By: Margarette Canada M.D.   On: 11/19/2018 16:51   Assessment/Plan The patient is a 58 year old male with multiple medical issues including morbid obesity, A. fib on Coumadin, on chronic oxygen who presented to the [facility] emergency department with progressively worsening shortness of breath or decompensated requiring emergent intubation. 1.  Respiratory failure: Unable to wean from vent.  At this time, the family would would like to continue aggressive measures.  [name] to undergo a tracheostomy placement tomorrow 2.  Acute renal failure: The patient currently has a temporary dialysis in his left internal jugular vein.  Since the patient is undergoing a  tracheostomy tomorrow will need to exchange his internal jugular hemodialysis catheter and place it in the groin.  We will plan on this tomorrow morning with Dr. Lucky Cowboy. ICU staff is aware. 3.  Diabetes: Encouraged good control as its slows the progression of atherosclerotic disease.  Discussed with Dr. Mayme Genta, PA-C  12/04/2018 5:14 PM  This note was created with Dragon medical transcription system.  Any error is purely unintentional

## 2018-12-04 NOTE — Progress Notes (Signed)
[facility] NAME: Kyle White    MR#:  [id number]  DATE OF BIRTH:  03/16/1961  SUBJECTIVE:  Pt is afebrile,remains intubated on the ventilator, severely hypoxemic with underlying pickwickian syndrome,unsuccessful weaning trials.  Critically ill, uremic, started hemodialysis--issues with catheter clotting  underwent bronchoscopy 11/22/18.  Palliative care team is involved   patient now on IV pressers, amiodarone REVIEW OF SYSTEMS:   Review of Systems  Unable to perform ROS: Intubated  Psychiatric/Behavioral: Nervous/anxious:      DRUG ALLERGIES:   No Active Allergies  VITALS:  Blood pressure 96/73, pulse (!) 116, temperature 98.3 F (36.8 C), temperature source Axillary, resp. rate 20, height 5' 9.02" (1.753 m), weight (!) 220.9 kg, SpO2 95 %.  PHYSICAL EXAMINATION:   Physical Exam  GENERAL:  58 y.o.-year-old patient lying in the bed with no acute distress. Morbidly obese Morbidly obese Critically ill intubated on the vent EYES: Pupils equal, round, reactive to light and accommodation. No scleral icterus. Extraocular muscles intact.  HEENT: Head atraumatic, normocephalic. Oropharynx and nasopharynx clear.  NECK:  Supple, no jugular venous distention. No thyroid enlargement, no tenderness.  LUNGS: Mod breath sounds bilaterally, positive wheezing, rales, rhonchi. CARDIOVASCULAR: S1, S2 normal. No murmurs, rubs, or gallops.  ABDOMEN: Soft, nontender, nondistended. Bowel sounds present. No organomegaly or mass.  EXTREMITIES: No cyanosis, clubbing or edema b/l.    NEUROLOGIC: sedated GCS less than 8 PSYCHIATRIC: sedated  and on the vent SKIN: No obvious rash, lesion, or ulcer.   LABORATORY PANEL:  CBC Recent Labs  Lab 12/04/18 0438  WBC 5.1  HGB 9.9*  HCT 31.4*  PLT 46*    Chemistries  Recent Labs  Lab 12/03/18 0435 12/04/18 0438  NA 147* 149*  K 3.5 3.1*  CL 110 111  CO2 22 22  GLUCOSE 292* 181*  BUN  148* 144*  CREATININE 3.53* 3.38*  CALCIUM 8.1* 8.0*  MG  --  2.9*  AST 35  --   ALT 223*  --   ALKPHOS 114  --   BILITOT 0.8  --    Cardiac Enzymes No results for input(s): TROPONINI in the last 168 hours. RADIOLOGY:  Dg Abd 1 View  Result Date: 12/03/2018 CLINICAL DATA:  Check gastric catheter placement EXAM: ABDOMEN - 1 VIEW COMPARISON:  Film from earlier in the same day. FINDINGS: Gastric catheter has been advanced several cm and now lies with the tip in the distal stomach. IMPRESSION: Gastric catheter within the stomach. Electronically Signed   By: Inez Catalina M.D.   On: 12/03/2018 20:50   Dg Abd 1 View  Result Date: 12/03/2018 CLINICAL DATA:  Check gastric catheter placement EXAM: ABDOMEN - 1 VIEW COMPARISON:  11/30/2018 FINDINGS: Gastric catheter is noted within the distal esophagus but does not appear to have reached the gastric lumen. Scattered large and small bowel gas is noted. IMPRESSION: Gastric catheter within the distal esophagus. This should be advanced several cm. Electronically Signed   By: Inez Catalina M.D.   On: 12/03/2018 20:50   Dg Chest Port 1 View  Result Date: 12/03/2018 CLINICAL DATA:  Check endotracheal tube placement EXAM: PORTABLE CHEST 1 VIEW COMPARISON:  Film from earlier in the same day. FINDINGS: Endotracheal tube has been advanced and now lies approximately 2.8 cm above the carina. Gastric catheter is again seen although the tip of the catheter is not appreciated on this exam. Bilateral jugular central lines are again seen and stable. Increasing opacification  in the right hemithorax is noted likely related to a combination of consolidation and effusion. The left lung is clear with the exception of vascular congestion. IMPRESSION: Tubes and lines as described. Persistent vascular congestion. Increasing right hemithorax opacification consistent with a combination of consolidation and effusion. Electronically Signed   By: Inez Catalina M.D.   On: 12/03/2018 20:49    Dg Chest Port 1 View  Result Date: 12/03/2018 CLINICAL DATA:  Endotracheal tube adjustment EXAM: PORTABLE CHEST 1 VIEW COMPARISON:  Chest radiograph from earlier today. FINDINGS: Endotracheal tube tip is 7.0 cm above the carina, at the level of thoracic inlet. Enteric tube enters stomach with the tip not seen on this image. Left internal jugular central venous catheter terminates in the left brachiocephalic vein near the midline. Right internal jugular central venous catheter terminates in the middle third of the SVC. Stable cardiomediastinal silhouette with moderate cardiomegaly. No pneumothorax. Stable small bilateral pleural effusions, noting portion of the left costophrenic angle not included on this image. Moderate pulmonary edema appears similar. Bibasilar opacity, favor atelectasis, unchanged. IMPRESSION: 1. Endotracheal tube tip 7.0 cm above the carina, located at the level of the thoracic inlet. Additional support structures as detailed. No pneumothorax. 2. Stable moderate congestive heart failure with small bilateral pleural effusions and bibasilar lung opacities, favor atelectasis. Electronically Signed   By: Ilona Sorrel M.D.   On: 12/03/2018 19:48   Dg Chest Port 1 View  Result Date: 12/03/2018 CLINICAL DATA:  Check endotracheal tube placement EXAM: PORTABLE CHEST 1 VIEW COMPARISON:  12/03/2018 FINDINGS: Endotracheal tube is again noted at the level of the thoracic inlet. Gastric catheter is noted extending into the stomach. Bilateral jugular central lines are again seen and stable. Diffuse vascular congestion is noted. Mild interstitial edema is seen. Cardiomegaly is noted. Likely small posterior effusions are present as well. No bony abnormality is seen. IMPRESSION: Vascular congestion and edema with mild effusions. Electronically Signed   By: Inez Catalina M.D.   On: 12/03/2018 19:19   Dg Chest Port 1 View  Result Date: 12/03/2018 CLINICAL DATA:  Respiratory failure. EXAM: PORTABLE CHEST  1 VIEW COMPARISON:  Radiograph of December 19, 2018. FINDINGS: Stable cardiomegaly. Endotracheal and nasogastric tubes are unchanged in position. Bilateral internal jugular catheters are noted which are unchanged in position. No pneumothorax is noted. Stable bibasilar atelectasis is noted with small pleural effusions. Bony thorax is unremarkable. IMPRESSION: Stable support apparatus. Stable bibasilar opacities as described above. Electronically Signed   By: Marijo Conception M.D.   On: 12/03/2018 07:41   Dg Chest Port 1 View  Result Date: 12/02/2018 CLINICAL DATA:  ET placement EXAM: PORTABLE CHEST 1 VIEW COMPARISON:  Same-day radiograph FINDINGS: Portion of the left lung base is collimated from view. Interval placement of the endotracheal tube within the upper trachea approximately 8 cm from the carina. Recommend advancement 3-4 cm to position in the mid trachea. Transesophageal tube tip and side port distal to the GE junction. Left IJ catheter tip terminates at the brachiocephalic-caval confluence. Right IJ catheter tip terminates in the lower SVC. Some improving aeration is noted in the right hemithorax. Persistent opacity is noted in both lung bases, right greater than left. Cardiomediastinal silhouette is still largely obscured. IMPRESSION: Interval placement of the endotracheal tube within the upper trachea approximately 8 cm from the carina. Recommend advancement 3-4 cm to position in the mid trachea. Remaining lines and tubes in stable position. Improving aeration of the right lung. These results will be called to  the ordering clinician or representative by the Radiologist Assistant, and communication documented in the PACS or zVision Dashboard. Electronically Signed   By: Lovena Le M.D.   On: 12/02/2018 22:16   Dg Chest Port 1 View  Result Date: 12/02/2018 CLINICAL DATA:  Respiratory failure EXAM: PORTABLE CHEST 1 VIEW COMPARISON:  12/02/2018, 12/01/2018, 11/30/2018 FINDINGS: Endotracheal tube tip  slightly above thoracic inlet and is 9.8 cm superior to the carina. Left IJ central venous catheter tip projects over the brachiocephalic confluence. Esophageal tube tip courses below diaphragm but is non included. Right IJ central venous catheter tip over the SVC. Interval diffuse white out of the right thorax. Truncated appearance of the right bronchus. Suspected moderate left pleural effusion with edema or layering fluid on the left. Consolidation left base. Obscured cardiomediastinal silhouette. IMPRESSION: 1. Endotracheal tube tip slightly above thoracic inlet and is 9.8 cm superior to carina 2. Interval diffuse white out of the right thorax. Truncated appearance of the right bronchus suggestive of occlusive process. 3. Continued left pleural effusion with dense airspace disease at the left base. Electronically Signed   By: Donavan Foil M.D.   On: 12/02/2018 21:27   ASSESSMENT AND PLAN:  Sentell Melendres  is a 58 y.o. male with a known history of atrial fibrillation on Coumadin, CHF, diabetes mellitus, and obstructive sleep apnea with CPAP use at home, morbid obesity with sedentary lifestyle on oxygen at 2 L/min at home.  1.Acute on chronic  respiratory failure with severe hypoxia- underlying pickwickian syndrome -No clinical improvement, unsuccessful weaning trials -Extubated on 8/31 but required reintubation, vent management per PCCM team.   -patient underwent bronchoscopy 11/22/2018. 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 -vent support --- extremely poor prognosis with high mortality and morbidity -patient is planned to get tracheostomy per ENT  #-Sepsis from pneumonia with ARDS Goal is to keep map greater than or equal to 65 -IV phenylephrine gtt -compledted Abxs  #.Obstructive sleep apnea-- chronic  #Acute on chronic diastolic CHF, EF 55 to 123456 on echo in 2014 at [facility] -Repeat echocardiogram shows EF of 55 to 60%  # History of atrial fibrillation -Coumadin on hold  -Heparin  drip  discontinued in view of thrombocytopenia   #. Acute renal failure on chronic kidney disease stage III  with baseline creatinine 1.28-1.89-1.87-2.25-2.21-3.05--3.17-3.11 -Repeat BMP in the a.m. and continue to monitor renal function closely -Patient being uremic nephrology started the patient on hemodialysis  #  Type IIDiabetes mellitus -Sliding scale insulin -Hemoglobin A1c 7.3    Overall poor prognosis. Appreciate palliative care input.   CODE STATUS: DNR  DVT Prophylaxis: scd as patient is thrombocytopenic  TOTAL TIME TAKING CARE OF THIS PATIENT:25 minutes.  >50% time spent on counselling and coordination of care  POSSIBLE D/C IN *?* DAYS, DEPENDING ON CLINICAL CONDITION.  Note: This dictation was prepared with Dragon dictation along with smaller phrase technology. Any transcriptional errors that result from this process are unintentional.   Fritzi Mandes M.D on 12/04/2018 at 3:10 PM  Between 7am to 6pm - Pager - [phone]  After 6pm go to [email or web address] - password [email or web address] [facility]  Office  [phone]  CC: Primary care physician; Inc, [place] ServicesPatient ID: Joshu Derosier, male   DOB: 1960-03-22, 58 y.o.   MRN: [id number]

## 2018-12-04 NOTE — Progress Notes (Signed)
[facility], [place] 12/04/18  Subjective:   LOS: 18 09/14 0701 - 09/15 0700 In: 2235.5 [I.V.:1692.1; IV Piggyback:543.3] Out: 2684 [Urine:3050] Critically ill Vent assisted. Fio2 60 %  Sedation - fentanyl Continues to have large amount of edema Could not complete dialysis due to poor cathter function   Objective:  Vital signs in last 24 hours:  Temp:  [98.8 F (37.1 C)-99.9 F (37.7 C)] 99.7 F (37.6 C) (09/15 0800) Pulse Rate:  [52-110] 67 (09/15 0700) Resp:  [20-24] 20 (09/15 0900) BP: (86-179)/(53-146) 113/68 (09/15 0900) SpO2:  [73 %-100 %] 94 % (09/15 0800) FiO2 (%):  [40 %-100 %] 60 % (09/15 0800) Weight:  [220.9 kg] 220.9 kg (09/15 0216)  Weight change: -2.722 kg Filed Weights   12/02/18 0500 12/03/18 0400 12/04/18 0216  Weight: (!) 218.2 kg (!) 223.6 kg (!) 220.9 kg    Intake/Output:    Intake/Output Summary (Last 24 hours) at 12/04/2018 1104 Last data filed at 12/04/2018 0936 Gross per 24 hour  Intake 4889.01 ml  Output 2664 ml  Net 2225.01 ml     Physical Exam: General: critically ill apearing  HEENT ETT in place  Pulm/lungs Vent assisted  CVS/Heart Irregular, tachycardic  Abdomen:  Obese, dependent edema  Extremities: SCDs, + dependent edema  Neurologic: sedated  Skin: warm  Access: Left IJ trialysis   Rectal tube, Foley in place    Basic Metabolic Panel:  Recent Labs  Lab 11/30/18 0632 12/01/18 0419 12/02/18 0456 12/03/18 0435 12/04/18 0438  NA 147* 146* 147* 147* 149*  K 4.2 4.0 4.3 3.5 3.1*  CL 111 111 111 110 111  CO2 23 25 26 22 22   GLUCOSE 241* 302* 252* 292* 181*  BUN 115* 111* 128* 148* 144*  CREATININE 3.11* 3.07* 3.27* 3.53* 3.38*  CALCIUM 7.7* 7.9* 8.3* 8.1* 8.0*  MG 2.8*  --   --   --  2.9*  PHOS 7.1*  --   --   --  6.2*     CBC: Recent Labs  Lab 11/30/18 9597 12/01/18 0419 12/02/18 0456 12/03/18 0435 12/04/18 0438  WBC 8.9 9.1 13.2* 8.4 5.1  NEUTROABS 8.3*  --   --  7.2  --    HGB 13.0 12.4* 12.7* 10.6* 9.9*  HCT 40.4 39.3 41.6 32.3* 31.4*  MCV 93.7 94.9 97.9 93.4 94.6  PLT 45* 46* 61* 57* 46*      Lab Results  Component Value Date   HEPBSAG Negative 11/29/2018   HEPBSAB Non Reactive 11/29/2018   HEPBIGM Negative 11/29/2018      Microbiology:  Recent Results (from the past 240 hour(s))  CULTURE, BLOOD (ROUTINE X 2) w Reflex to ID Panel     Status: None   Collection Time: 11/26/18 10:32 PM   Specimen: BLOOD  Result Value Ref Range Status   Specimen Description BLOOD RIGHT ANTECUBITAL  Final   Special Requests   Final    BOTTLES DRAWN AEROBIC AND ANAEROBIC Blood Culture adequate volume   Culture   Final    NO GROWTH 5 DAYS Performed at [facility], [address] [place], [place] [address]    Report Status 12/01/2018 FINAL  Final  CULTURE, BLOOD (ROUTINE X 2) w Reflex to ID Panel     Status: None   Collection Time: 11/26/18 10:32 PM   Specimen: BLOOD  Result Value Ref Range Status   Specimen Description BLOOD BLOOD RIGHT HAND  Final   Special Requests   Final  BOTTLES DRAWN AEROBIC AND ANAEROBIC Blood Culture adequate volume   Culture   Final    NO GROWTH 5 DAYS Performed at [facility], [address] [place], [place] [address]    Report Status 12/01/2018 FINAL  Final  Respiratory Panel by PCR     Status: None   Collection Time: 11/28/18 10:36 AM   Specimen: Nasopharyngeal Swab; Respiratory  Result Value Ref Range Status   Adenovirus NOT DETECTED NOT DETECTED Final   Coronavirus 229E NOT DETECTED NOT DETECTED Final    Comment: (NOTE) The Coronavirus on the Respiratory Panel, DOES NOT test for the novel  Coronavirus (2019 nCoV)    Coronavirus HKU1 NOT DETECTED NOT DETECTED Final   Coronavirus NL63 NOT DETECTED NOT DETECTED Final   Coronavirus OC43 NOT DETECTED NOT DETECTED Final   Metapneumovirus NOT DETECTED NOT DETECTED Final   Rhinovirus / Enterovirus NOT DETECTED NOT DETECTED Final   Influenza A NOT  DETECTED NOT DETECTED Final   Influenza B NOT DETECTED NOT DETECTED Final   Parainfluenza Virus 1 NOT DETECTED NOT DETECTED Final   Parainfluenza Virus 2 NOT DETECTED NOT DETECTED Final   Parainfluenza Virus 3 NOT DETECTED NOT DETECTED Final   Parainfluenza Virus 4 NOT DETECTED NOT DETECTED Final   Respiratory Syncytial Virus NOT DETECTED NOT DETECTED Final   Bordetella pertussis NOT DETECTED NOT DETECTED Final   Chlamydophila pneumoniae NOT DETECTED NOT DETECTED Final   Mycoplasma pneumoniae NOT DETECTED NOT DETECTED Final    Comment: Performed at [facility], [place]. [address], [place], [place] [address]  Culture, respiratory (non-expectorated)     Status: None   Collection Time: 11/28/18 11:29 AM   Specimen: Tracheal Aspirate; Respiratory  Result Value Ref Range Status   Specimen Description   Final    TRACHEAL ASPIRATE Performed at [facility], [address], [place], [place] [address]    Special Requests   Final    NONE Performed at [facility], [address] [place], [place] [address]    Gram Stain   Final    FEW WBC PRESENT, PREDOMINANTLY PMN FEW SQUAMOUS EPITHELIAL CELLS PRESENT RARE GRAM POSITIVE COCCI IN PAIRS    Culture   Final    FEW Consistent with normal respiratory flora. Performed at [facility], [place] [address], [place], [place] [address]    Report Status 11/30/2018 FINAL  Final  Culture, respiratory (non-expectorated)     Status: None   Collection Time: 12/02/18  8:45 AM   Specimen: Tracheal Aspirate; Respiratory  Result Value Ref Range Status   Specimen Description   Final    TRACHEAL ASPIRATE Performed at [facility], [address], [place], [place] [address]    Special Requests   Final    NONE Performed at [facility], [place] [place], [place] [address]    Gram Stain   Final    NO WBC SEEN NO ORGANISMS SEEN Performed at [facility], [place] [address], [place],  [address]     Culture FEW CANDIDA ALBICANS  Final   Report Status 12/04/2018 FINAL  Final    Coagulation Studies: No results for input(s): LABPROT, INR in the last 72 hours.  Urinalysis: No results for input(s): COLORURINE, LABSPEC, PHURINE, GLUCOSEU, HGBUR, BILIRUBINUR, KETONESUR, PROTEINUR, UROBILINOGEN, NITRITE, LEUKOCYTESUR in the last 72 hours.  Invalid input(s): APPERANCEUR    Imaging: Dg Abd 1 View  Result Date: 12/03/2018 CLINICAL DATA:  Check gastric catheter placement EXAM:  ABDOMEN - 1 VIEW COMPARISON:  Film from earlier in the same day. FINDINGS: Gastric catheter has been advanced several cm and now lies with the tip in the distal stomach. IMPRESSION: Gastric catheter within the stomach. Electronically Signed   By: Inez Catalina M.D.   On: 12/03/2018 20:50   Dg Abd 1 View  Result Date: 12/03/2018 CLINICAL DATA:  Check gastric catheter placement EXAM: ABDOMEN - 1 VIEW COMPARISON:  11/30/2018 FINDINGS: Gastric catheter is noted within the distal esophagus but does not appear to have reached the gastric lumen. Scattered large and small bowel gas is noted. IMPRESSION: Gastric catheter within the distal esophagus. This should be advanced several cm. Electronically Signed   By: Inez Catalina M.D.   On: 12/03/2018 20:50   Dg Chest Port 1 View  Result Date: 12/03/2018 CLINICAL DATA:  Check endotracheal tube placement EXAM: PORTABLE CHEST 1 VIEW COMPARISON:  Film from earlier in the same day. FINDINGS: Endotracheal tube has been advanced and now lies approximately 2.8 cm above the carina. Gastric catheter is again seen although the tip of the catheter is not appreciated on this exam. Bilateral jugular central lines are again seen and stable. Increasing opacification in the right hemithorax is noted likely related to a combination of consolidation and effusion. The left lung is clear with the exception of vascular congestion. IMPRESSION: Tubes and lines as described. Persistent vascular congestion.  Increasing right hemithorax opacification consistent with a combination of consolidation and effusion. Electronically Signed   By: Inez Catalina M.D.   On: 12/03/2018 20:49   Dg Chest Port 1 View  Result Date: 12/03/2018 CLINICAL DATA:  Endotracheal tube adjustment EXAM: PORTABLE CHEST 1 VIEW COMPARISON:  Chest radiograph from earlier today. FINDINGS: Endotracheal tube tip is 7.0 cm above the carina, at the level of thoracic inlet. Enteric tube enters stomach with the tip not seen on this image. Left internal jugular central venous catheter terminates in the left brachiocephalic vein near the midline. Right internal jugular central venous catheter terminates in the middle third of the SVC. Stable cardiomediastinal silhouette with moderate cardiomegaly. No pneumothorax. Stable small bilateral pleural effusions, noting portion of the left costophrenic angle not included on this image. Moderate pulmonary edema appears similar. Bibasilar opacity, favor atelectasis, unchanged. IMPRESSION: 1. Endotracheal tube tip 7.0 cm above the carina, located at the level of the thoracic inlet. Additional support structures as detailed. No pneumothorax. 2. Stable moderate congestive heart failure with small bilateral pleural effusions and bibasilar lung opacities, favor atelectasis. Electronically Signed   By: Ilona Sorrel M.D.   On: 12/03/2018 19:48   Dg Chest Port 1 View  Result Date: 12/03/2018 CLINICAL DATA:  Check endotracheal tube placement EXAM: PORTABLE CHEST 1 VIEW COMPARISON:  12/03/2018 FINDINGS: Endotracheal tube is again noted at the level of the thoracic inlet. Gastric catheter is noted extending into the stomach. Bilateral jugular central lines are again seen and stable. Diffuse vascular congestion is noted. Mild interstitial edema is seen. Cardiomegaly is noted. Likely small posterior effusions are present as well. No bony abnormality is seen. IMPRESSION: Vascular congestion and edema with mild effusions.  Electronically Signed   By: Inez Catalina M.D.   On: 12/03/2018 19:19   Dg Chest Port 1 View  Result Date: 12/03/2018 CLINICAL DATA:  Respiratory failure. EXAM: PORTABLE CHEST 1 VIEW COMPARISON:  Radiograph of December 19, 2018. FINDINGS: Stable cardiomegaly. Endotracheal and nasogastric tubes are unchanged in position. Bilateral internal jugular catheters are noted which are unchanged in position. No  pneumothorax is noted. Stable bibasilar atelectasis is noted with small pleural effusions. Bony thorax is unremarkable. IMPRESSION: Stable support apparatus. Stable bibasilar opacities as described above. Electronically Signed   By: Marijo Conception M.D.   On: 12/03/2018 07:41   Dg Chest Port 1 View  Result Date: 12/02/2018 CLINICAL DATA:  ET placement EXAM: PORTABLE CHEST 1 VIEW COMPARISON:  Same-day radiograph FINDINGS: Portion of the left lung base is collimated from view. Interval placement of the endotracheal tube within the upper trachea approximately 8 cm from the carina. Recommend advancement 3-4 cm to position in the mid trachea. Transesophageal tube tip and side port distal to the GE junction. Left IJ catheter tip terminates at the brachiocephalic-caval confluence. Right IJ catheter tip terminates in the lower SVC. Some improving aeration is noted in the right hemithorax. Persistent opacity is noted in both lung bases, right greater than left. Cardiomediastinal silhouette is still largely obscured. IMPRESSION: Interval placement of the endotracheal tube within the upper trachea approximately 8 cm from the carina. Recommend advancement 3-4 cm to position in the mid trachea. Remaining lines and tubes in stable position. Improving aeration of the right lung. These results will be called to the ordering clinician or representative by the Radiologist Assistant, and communication documented in the PACS or zVision Dashboard. Electronically Signed   By: Lovena Le M.D.   On: 12/02/2018 22:16   Dg Chest  Port 1 View  Result Date: 12/02/2018 CLINICAL DATA:  Respiratory failure EXAM: PORTABLE CHEST 1 VIEW COMPARISON:  12/02/2018, 12/01/2018, 11/30/2018 FINDINGS: Endotracheal tube tip slightly above thoracic inlet and is 9.8 cm superior to the carina. Left IJ central venous catheter tip projects over the brachiocephalic confluence. Esophageal tube tip courses below diaphragm but is non included. Right IJ central venous catheter tip over the SVC. Interval diffuse white out of the right thorax. Truncated appearance of the right bronchus. Suspected moderate left pleural effusion with edema or layering fluid on the left. Consolidation left base. Obscured cardiomediastinal silhouette. IMPRESSION: 1. Endotracheal tube tip slightly above thoracic inlet and is 9.8 cm superior to carina 2. Interval diffuse white out of the right thorax. Truncated appearance of the right bronchus suggestive of occlusive process. 3. Continued left pleural effusion with dense airspace disease at the left base. Electronically Signed   By: Donavan Foil M.D.   On: 12/02/2018 21:27     Medications:   . sodium chloride Stopped (12/03/18 0527)  . amiodarone 60 mg/hr (12/04/18 1100)  . dexmedetomidine (PRECEDEX) IV infusion 0.4 mcg/kg/hr (12/04/18 1104)  . feeding supplement (VITAL HIGH PROTEIN) Stopped (12/04/18 0305)  . fentaNYL infusion INTRAVENOUS 150 mcg/hr (12/04/18 0900)  . phenylephrine (NEO-SYNEPHRINE) Adult infusion 20 mcg/min (12/04/18 0900)   . aspirin  81 mg Per NG tube Daily  . chlorhexidine gluconate (MEDLINE KIT)  15 mL Mouth Rinse BID  . Chlorhexidine Gluconate Cloth  6 each Topical Daily  . clonazePAM  1 mg Per Tube Daily  . clonazePAM  2 mg Per Tube QHS  . feeding supplement (PRO-STAT SUGAR FREE 64)  60 mL Per Tube TID  . free water  350 mL Per Tube Q4H  . insulin aspart  0-20 Units Subcutaneous Q4H  . insulin aspart  2 Units Subcutaneous Q4H  . insulin glargine  40 Units Subcutaneous Daily  .  ipratropium-albuterol  3 mL Nebulization Q6H  . lactulose  30 g Per Tube TID  . mouth rinse  15 mL Mouth Rinse 10 times per day  . multivitamin  15 mL Per Tube Daily  . pantoprazole sodium  40 mg Per Tube QHS  . polyethylene glycol  17 g Per Tube BID  . QUEtiapine  25 mg Per Tube QHS  . QUEtiapine  50 mg Per Tube QHS  . senna-docusate  2 tablet Per Tube TID  . sodium chloride flush  10-40 mL Intracatheter Q12H  . vancomycin variable dose per unstable renal function (pharmacist dosing)   Does not apply See admin instructions   sodium chloride, acetaminophen, bisacodyl, fentaNYL, metoprolol tartrate, midazolam, sodium chloride flush  Assessment/ Plan:  58 y.o. male with with atrial fibrillation on warfarin, congestive heart failure, diabetes mellitus type 2, obstructive sleep apnea,morbid obesity, gout, mitral valve replacementwho was admitted to [facility] on[facility]2020for acute exacerbation of COPD  Active Problems:   Acute exacerbation of CHF (congestive heart failure) ([place])   Acute respiratory failure with hypoxia and hypercapnia ([facility])   Goals of care, counseling/discussion   Palliative care by specialist   DNR (do not resuscitate) discussion   COPD exacerbation ([place])   #. ARF on CKD st3 Recent Labs    12/01/18 0419 12/02/18 0456 12/03/18 0435 12/04/18 0438  CREATININE 3.07* 3.27* 3.53* 3.38*  S Creatinine remains elevated BUN is critically high UOP about 3000 cc Patient will benefit from HD to correct uremia  Orders prepared - Avoid nephrotoxins such as NSAIDs, iv contrast, Fleets enema  #. Anemia    Lab Results  Component Value Date   HGB 9.9 (L) 12/04/2018   # Acute resp failure - Vent dependent - Fio2 60% (worse)  # generalized Edema - 2 D echo from 8/29 shows LVEF 55-60% (nomral). Abnormal severe thickening of aortic wall Tricuspid valve and Rt vent were not assesed - Amiodarone for A Fib - not on diuretic, will attempt UF with HD  #. Diabetes type 2 with  CKD Hgb A1c MFr Bld (%)  Date Value  11/17/2018 7.3 (H)     LOS: [place] 9/15/202011:04 [place], [place]

## 2018-12-04 NOTE — Plan of Care (Signed)
  Problem: Health Behavior/Discharge Planning: Goal: Ability to manage health-related needs will improve Outcome: Progressing   Problem: Clinical Measurements: Goal: Ability to maintain clinical measurements within normal limits will improve Outcome: Progressing Goal: Will remain free from infection Outcome: Progressing Goal: Diagnostic test results will improve Outcome: Progressing Goal: Respiratory complications will improve Outcome: Progressing Goal: Cardiovascular complication will be avoided Outcome: Progressing   Problem: Activity: Goal: Risk for activity intolerance will decrease Outcome: Progressing   Problem: Coping: Goal: Level of anxiety will decrease Outcome: Progressing   Problem: Elimination: Goal: Will not experience complications related to bowel motility Outcome: Progressing Goal: Will not experience complications related to urinary retention Outcome: Progressing   Problem: Pain Managment: Goal: General experience of comfort will improve Outcome: Progressing

## 2018-12-04 NOTE — Progress Notes (Signed)
CRITICAL CARE NOTE 58 y.o.M with hx of type 2 diabetes, chronic AF, pickwickian syndrome, hypertension, congestive heart failure adm 8/28 via ED to ICU/SDU with acute on chronic respiratory failure felt to be due to CHF/pulmonary edema CC  follow up respiratory failure  SUBJECTIVE Patient remains critically ill Prognosis is guarded highj fio2  Can not wean from vent Needs trach for survival vasc surgery consulted for VAsc cath placement   BP 106/71   Pulse 91   Temp 99.9 F (37.7 C) (Oral)   Resp 20   Ht 5' 9.02" (1.753 m)   Wt (!) 220.9 kg   SpO2 95%   BMI 71.88 kg/m    I/O last 3 completed shifts: In: 5109.8 [I.V.:2877.8; Other:10; OE/UM:3536; IV RWERXVQMG:867] Out: 6195 [Urine:3800] No intake/output data recorded.  SpO2: 95 % O2 Flow Rate (L/min): 3 L/min FiO2 (%): 70 %  Vent Mode: PCV FiO2 (%):  [40 %-100 %] 70 % Set Rate:  [20 bmp-24 bmp] 20 bmp PEEP:  [5 cmH20-10 cmH20] 8 cmH20 Plateau Pressure:  [27 cmH20] 27 cmH20  CBC    Component Value Date/Time   WBC 5.1 12/04/2018 0438   RBC 3.32 (L) 12/04/2018 0438   HGB 9.9 (L) 12/04/2018 0438   HCT 31.4 (L) 12/04/2018 0438   PLT 46 (L) 12/04/2018 0438   MCV 94.6 12/04/2018 0438   MCH 29.8 12/04/2018 0438   MCHC 31.5 12/04/2018 0438   RDW 13.9 12/04/2018 0438   LYMPHSABS 0.7 12/03/2018 0435   MONOABS 0.2 12/03/2018 0435   EOSABS 0.2 12/03/2018 0435   BASOSABS 0.0 12/03/2018 0435   BMP Latest Ref Rng & Units 12/04/2018 12/03/2018 12/02/2018  Glucose 70 - 99 mg/dL 181(H) 292(H) 252(H)  BUN 6 - 20 mg/dL 144(H) 148(H) 128(H)  Creatinine 0.61 - 1.24 mg/dL 3.38(H) 3.53(H) 3.27(H)  Sodium 135 - 145 mmol/L 149(H) 147(H) 147(H)  Potassium 3.5 - 5.1 mmol/L 3.1(L) 3.5 4.3  Chloride 98 - 111 mmol/L 111 110 111  CO2 22 - 32 mmol/L _0 Calcium 8.9 - 10.3 mg/dL 8.0(L) 8.1(L) 8.3(L)     SIGNIFICANT EVENTS MAJOR EVENTS/TEST RESULTS: 08/28 admitted to ICU/SDU for BiPAP. PCCM consultation 08/28 emergently  intubated 08/29 Echocardiogram: EF 55-60%. LV cavity mildly dilated. Diastolic function could not be evaluated. Left atrium mildly dilated. RV not assessed. RA size not well visualized. 08/29 CTAP: No acute intra-abdominal process noted. No perforation. Trace ascites. Severe hepatic steatosis. Cholelithiasis. Small R and trace L pleural effusions. Bilateral lower lobe airspace disease may represent atelectasis but pneumonia is difficult to exclude, particularly on the R. 08/30 Extubated 08/30 Cardiology consultation: recommended diltiazem gtt for rapid AF and continue anticoagulation 08/31 reintubated emergently 08/31 bronchoscopy for mucous plugging and complete collapse of R lung 08/31 Palliative Care consultation to address goals of care 09/03 DNR established 09/05 Nephrology consultation for AKI 09/06 Renal KD:TOIZTI appearance of the kidneys 09/06 requiring 100% FiO2 and PEEP 15 cm H2O 09/07 ETT cuff leak, ETT changed 09/08 CTH: No acute intracranial abnormality seen 09/10 HD cath placed and intermittent HD initiated 09/11 + F/C. Improving vent requirements. 2 hrs of HD performed 09/12 improving vent requirements. Low-dose clonazepam and Duragesic patch initiated. Goals of care discussion with patient's sister and recommended tracheostomy tube. 09/13 Severe desaturation with increased purulent respiratory secretions and volume loss due to mucus plugging.Respiratory culture obtained. Antibiotics expanded to piperacillin-tazobactam. DNR confirmed with patient's sister. Continuous sedation infusions initiated 9/14 ETT dislodged, s/p bronch to adjust ETT, extubated and re-intubated Patient  has tracheomegaly, plan for trach  INDWELLING DEVICES:: ETT 08/28 >>08/30, 08/31 >>09/07 (cuff leak), 09/07 >> R IJ CVL 08/31 >> L IJ HD cath 09/10 >>  MICRO DATA: SARS CoV 2 PCR 08/28 >> NEG MRSA PCR 08/28 >> NEG Urine 08/28 >> NEG Resp 08/28 >>abundant GPC chains Blood  08/29 >> NEG BAL 09/03 >> Staph simulans, strep mitis Blood 09/07 >> NEG RVP 09/09 >> NEG Resp 09/09 >> c/w NOF Resp 09/13 >>  ANTIMICROBIALS:  Cefepime 09/03 >>09/08  Vancomycin 09/07 >> 09/10 Meropenem 09/09 >> 09/11 Ceftriaxone 09/11 >>09/13 Pip-tazo 09/13 >>    REVIEW OF SYSTEMS  [name] UNABLE TO PROVIDE COMPLETE REVIEW OF SYSTEMS DUE TO SEVERE CRITICAL ILLNESS   PHYSICAL EXAMINATION:  GENERAL:critically ill appearing, +resp distress HEAD: Normocephalic, atraumatic.  EYES: Pupils equal, round, reactive to light.  No scleral icterus.  MOUTH: Moist mucosal membrane. NECK: Supple.  PULMONARY: +rhonchi, +wheezing CARDIOVASCULAR: S1 and S2. Regular rate and rhythm. No murmurs, rubs, or gallops.  GASTROINTESTINAL: Soft, nontender, -distended. No masses. Positive bowel sounds. No hepatosplenomegaly.  MUSCULOSKELETAL: No swelling, clubbing, or edema.  NEUROLOGIC: obtunded, GCS<8 SKIN:intact,warm,dry  MEDICATIONS: I have reviewed all medications and confirmed regimen as documented   CULTURE RESULTS   Recent Results (from the past 240 hour(s))  CULTURE, BLOOD (ROUTINE X 2) w Reflex to ID Panel     Status: None   Collection Time: 11/26/18 10:32 PM   Specimen: BLOOD  Result Value Ref Range Status   Specimen Description BLOOD RIGHT ANTECUBITAL  Final   Special Requests   Final    BOTTLES DRAWN AEROBIC AND ANAEROBIC Blood Culture adequate volume   Culture   Final    NO GROWTH 5 DAYS Performed at [facility], [address] [place], [place] [address]    Report Status 12/01/2018 FINAL  Final  CULTURE, BLOOD (ROUTINE X 2) w Reflex to ID Panel     Status: None   Collection Time: 11/26/18 10:32 PM   Specimen: BLOOD  Result Value Ref Range Status   Specimen Description BLOOD BLOOD RIGHT HAND  Final   Special Requests   Final    BOTTLES DRAWN AEROBIC AND ANAEROBIC Blood Culture adequate volume   Culture   Final    NO GROWTH 5 DAYS Performed at  [facility], [address] [place], [place] [address]    Report Status 12/01/2018 FINAL  Final  Respiratory Panel by PCR     Status: None   Collection Time: 11/28/18 10:36 AM   Specimen: Nasopharyngeal Swab; Respiratory  Result Value Ref Range Status   Adenovirus NOT DETECTED NOT DETECTED Final   Coronavirus 229E NOT DETECTED NOT DETECTED Final    Comment: (NOTE) The Coronavirus on the Respiratory Panel, DOES NOT test for the novel  Coronavirus (2019 nCoV)    Coronavirus HKU1 NOT DETECTED NOT DETECTED Final   Coronavirus NL63 NOT DETECTED NOT DETECTED Final   Coronavirus OC43 NOT DETECTED NOT DETECTED Final   Metapneumovirus NOT DETECTED NOT DETECTED Final   Rhinovirus / Enterovirus NOT DETECTED NOT DETECTED Final   Influenza A NOT DETECTED NOT DETECTED Final   Influenza B NOT DETECTED NOT DETECTED Final   Parainfluenza Virus 1 NOT DETECTED NOT DETECTED Final   Parainfluenza Virus 2 NOT DETECTED NOT DETECTED Final   Parainfluenza Virus 3 NOT DETECTED NOT DETECTED Final   Parainfluenza Virus 4 NOT DETECTED NOT DETECTED Final   Respiratory Syncytial Virus NOT DETECTED NOT DETECTED Final   Bordetella pertussis  NOT DETECTED NOT DETECTED Final   Chlamydophila pneumoniae NOT DETECTED NOT DETECTED Final   Mycoplasma pneumoniae NOT DETECTED NOT DETECTED Final    Comment: Performed at [facility], [place] [address], [place], [place] [address]  Culture, respiratory (non-expectorated)     Status: None   Collection Time: 11/28/18 11:29 AM   Specimen: Tracheal Aspirate; Respiratory  Result Value Ref Range Status   Specimen Description   Final    TRACHEAL ASPIRATE Performed at [facility], [address], [place], [place] [address]    Special Requests   Final    NONE Performed at [facility], [address] [place], [place] [address]    Gram Stain   Final    FEW WBC PRESENT, PREDOMINANTLY PMN FEW SQUAMOUS EPITHELIAL CELLS PRESENT RARE GRAM  POSITIVE COCCI IN PAIRS    Culture   Final    FEW Consistent with normal respiratory flora. Performed at [facility], [place] [address] [place], [place] [address]    Report Status 11/30/2018 FINAL  Final  Culture, respiratory (non-expectorated)     Status: None (Preliminary result)   Collection Time: 12/02/18  8:45 AM   Specimen: Tracheal Aspirate; Respiratory  Result Value Ref Range Status   Specimen Description   Final    TRACHEAL ASPIRATE Performed at [facility], [address] [place], [place] [address]    Special Requests   Final    NONE Performed at [facility], [address] [place], [place] [address]    Gram Stain NO WBC SEEN NO ORGANISMS SEEN   Final   Culture   Final    CULTURE REINCUBATED FOR BETTER GROWTH Performed at [facility], [place] [address], [place], [place] [address]    Report Status PENDING  Incomplete          IMAGING    Dg Abd 1 View  Result Date: 12/03/2018 CLINICAL DATA:  Check gastric catheter placement EXAM: ABDOMEN - 1 VIEW COMPARISON:  Film from earlier in the same day. FINDINGS: Gastric catheter has been advanced several cm and now lies with the tip in the distal stomach. IMPRESSION: Gastric catheter within the stomach. Electronically Signed   By: Inez Catalina M.D.   On: 12/03/2018 20:50   Dg Abd 1 View  Result Date: 12/03/2018 CLINICAL DATA:  Check gastric catheter placement EXAM: ABDOMEN - 1 VIEW COMPARISON:  11/30/2018 FINDINGS: Gastric catheter is noted within the distal esophagus but does not appear to have reached the gastric lumen. Scattered large and small bowel gas is noted. IMPRESSION: Gastric catheter within the distal esophagus. This should be advanced several cm. Electronically Signed   By: Inez Catalina M.D.   On: 12/03/2018 20:50   Dg Chest Port 1 View  Result Date: 12/03/2018 CLINICAL DATA:  Check endotracheal tube placement EXAM: PORTABLE CHEST 1 VIEW COMPARISON:  Film from earlier in the  same day. FINDINGS: Endotracheal tube has been advanced and now lies approximately 2.8 cm above the carina. Gastric catheter is again seen although the tip of the catheter is not appreciated on this exam. Bilateral jugular central lines are again seen and stable. Increasing opacification in the right hemithorax is noted likely related to a combination of consolidation and effusion. The left lung is clear with the exception of vascular congestion. IMPRESSION: Tubes and lines as described. Persistent vascular congestion. Increasing right hemithorax opacification consistent with a combination of consolidation and effusion. Electronically Signed   By: Elta Gu[place]oupe  Lukens M.D.   On: 12/03/2018 20:49   Dg Chest Port 1 View  Result Date: 12/03/2018 CLINICAL DATA:  Endotracheal tube adjustment EXAM: PORTABLE CHEST 1 VIEW COMPARISON:  Chest radiograph from earlier today. FINDINGS: Endotracheal tube tip is 7.0 cm above the carina, at the level of thoracic inlet. Enteric tube enters stomach with the tip not seen on this image. Left internal jugular central venous catheter terminates in the left brachiocephalic vein near the midline. Right internal jugular central venous catheter terminates in the middle third of the SVC. Stable cardiomediastinal silhouette with moderate cardiomegaly. No pneumothorax. Stable small bilateral pleural effusions, noting portion of the left costophrenic angle not included on this image. Moderate pulmonary edema appears similar. Bibasilar opacity, favor atelectasis, unchanged. IMPRESSION: 1. Endotracheal tube tip 7.0 cm above the carina, located at the level of the thoracic inlet. Additional support structures as detailed. No pneumothorax. 2. Stable moderate congestive heart failure with small bilateral pleural effusions and bibasilar lung opacities, favor atelectasis. Electronically Signed   By: Ilona Sorrel M.D.   On: 12/03/2018 19:48   Dg Chest Port 1 View  Result Date: 12/03/2018 CLINICAL DATA:   Check endotracheal tube placement EXAM: PORTABLE CHEST 1 VIEW COMPARISON:  12/03/2018 FINDINGS: Endotracheal tube is again noted at the level of the thoracic inlet. Gastric catheter is noted extending into the stomach. Bilateral jugular central lines are again seen and stable. Diffuse vascular congestion is noted. Mild interstitial edema is seen. Cardiomegaly is noted. Likely small posterior effusions are present as well. No bony abnormality is seen. IMPRESSION: Vascular congestion and edema with mild effusions. Electronically Signed   By: Inez Catalina M.D.   On: 12/03/2018 19:19       Indwelling Urinary Catheter continued, requirement due to   Reason to continue Indwelling Urinary Catheter strict Intake/Output monitoring for hemodynamic instability   Central Line/ continued, requirement due to  Reason to continue [place] of central venous pressure or other hemodynamic parameters and poor IV access   Ventilator continued, requirement due to severe respiratory failure   Ventilator Sedation RASS 0 to -2      ASSESSMENT AND PLAN SYNOPSIS 58 y.o.M with hx of type 2 diabetes, chronic AF, pickwickian syndrome, hypertension, congestive heart failure adm 8/28 via ED to ICU/SDU with acute on chronic respiratory failure felt to be due to CHF/pulmonary edema complicated by progressive renal failure and resp failure and failure to wean from vent failed extubation x 1  Severe ACUTE Hypoxic and Hypercapnic Respiratory Failure -continue Full MV support -continue Bronchodilator Therapy -Wean Fio2 and PEEP as tolerated Failure to wean from vetn Needs trach  ACUTE DIASTOLIC CARDIAC FAILURE-  -oxygen as needed -Lasix as tolerated    ACUTE KIDNEY INJURY/Renal Failure -follow chem 7 -follow UO -continue Foley Catheter-assess need -Avoid nephrotoxic agents -Recheck creatinine    NEUROLOGY - intubated and sedated - minimal sedation to achieve a RASS goal: -1   CARDIAC ICU  monitoring  ID -continue IV abx as prescibed -follow up cultures  GI GI PROPHYLAXIS as indicated  NUTRITIONAL STATUS DIET-->TF's as tolerated Constipation protocol as indicated  ENDO - will use ICU hypoglycemic\Hyperglycemia protocol if indicated   ELECTROLYTES -follow labs as needed -replace as needed -pharmacy consultation and following   DVT/GI PRX ordered TRANSFUSIONS AS NEEDED MONITOR FSBS ASSESS the need for LABS as needed   Critical Care Time devoted to patient care services described in this note is 34  minutes.   Overall, patient is critically ill, prognosis  is guarded.  Patient with Multiorgan failure and at high risk for cardiac arrest and death.   [name] DNR Family wishes to pursue Trach Patient has very low chance of meaningful    Markeesha Char Patricia Pesa, M.D.  [facility]  Medical Director [place] Director [facility] Cardio-Pulmonary Department

## 2018-12-04 NOTE — Consult Note (Signed)
Pharmacy Antibiotic Note  Kyle White is a 58 y.o. male admitted on 11/15/2018 with pneumonia. 9/3 BAL significant for Staph simulans and Strep mitis/oralis. Patient had received approximately 4 days of vancomycin therapy this admission (last dose 9/9). Meropenem was started on 9/9 for persistent fevers, concern for pseudomonas, and head CT significant for sinusitis. Meropenem and vancomycin were d/c 9/10 and ceftriaxone monotherapy was started. Ceftriaxone was then changed to Zosyn on 9/13 after concern for possible aspiration pneumonia. Per discussion on rounds plan is to discontinue Zosyn and re-start vancomycin as duration of therapy was determined to be inadequate. Pharmacy consulted for vancomycin dosing for Staph simulans and Strep mitis/oralis pneumonia. Tentative plan is to treat for 4-5 days.   Patient remains intubated. WBC are trending down, patient remains afebrile. CXR significant for stable bibasilar opacities with small pleural effusions. Patient also with acute renal failure secondary to acute cardiorenal syndrome receiving intermittent HD for uremia and fluid overload. Last HD session yesterday terminated after ~30 minutes due to catheter dysfunction. Dialysis currently on hold until access is re-established.   Day 2 of therapy. Patient has received LD of vancomycin 2 g x1 so far.   Plan:   9/15 @ 1400 vancomycin random level of 19.   Follow-up dialysis plan tomorrow. Will tentatively plan to give a dose tomorrow.   Height: 5' 9.02" (175.3 cm) Weight: (!) 487 lb (220.9 kg) IBW/kg (Calculated) : 70.74  Temp (24hrs), Avg:99.2 F (37.3 C), Min:98.8 F (37.1 C), Max:99.9 F (37.7 C)  Recent Labs  Lab 11/29/18 1623 11/30/18 0632 12/01/18 0419 12/02/18 0456 12/03/18 0435 12/04/18 0438  WBC  --  8.9 9.1 13.2* 8.4 5.1  CREATININE  --  3.11* 3.07* 3.27* 3.53* 3.38*  VANCORANDOM 28 22  --   --   --   --     Estimated Creatinine Clearance: 43.5 mL/min (A) (by C-G formula  based on SCr of 3.38 mg/dL (H)).    No Active Allergies  Antimicrobials this admission: Cefepime 9/3 >> 9/8 Vancomycin 9/7 >> 9/10, 9/14 >> Meropenem 9/9 >> 9/10 Ceftriaxone 9/11 >> 9/12 Zosyn 9/13 >> 9/14  Dose adjustments this admission:  9/9: Vancomycin decreased from 1750 mg Q24H to 1250 mg Q24H due to serum creatinine bump  9/10: Meropenem decreased from 1g Q12H to 500 mg Q24H due to patient starting intermittent HD. Vancomycin transitioned to Dialysis dosing.   Microbiology results: 9/13 tracheal aspirate: no organisms seen, reincubated for better growth 9/9 respiratory culture: rare GPC in pairs 9/9 respiratory panel: none detected 9/7 Bcx: no growth  9/3 BAL: Staph simulans & Strep mitis/oralis, vancomycin susceptible 8/29 Bcx: No growth 8/28 Ucx: No growth 8/28 MRSA PCR: negative  Thank you for allowing pharmacy to be a part of this patient's care.  [place] Resident 12/04/2018 11:28 AM

## 2018-12-04 NOTE — Progress Notes (Signed)
Pharmacy Electrolyte Monitoring Consult:  Pharmacy consulted to assist in monitoring and replacing electrolytes in this 58 y.o. male admitted on 10/24/2018. [name] currently intubated and sedated on dexmedetomidine and fentanyl infusion.   Labs:  Sodium (mmol/L)  Date Value  12/04/2018 149 (H)   Potassium (mmol/L)  Date Value  12/04/2018 3.1 (L)   Magnesium (mg/dL)  Date Value  12/04/2018 2.9 (H)   Phosphorus (mg/dL)  Date Value  12/04/2018 6.2 (H)   Calcium (mg/dL)  Date Value  12/04/2018 8.0 (L)   Albumin (g/dL)  Date Value  12/03/2018 1.8 (L)   Corrected Calcium: 9.8  Assessment/Plan: 1. Electrolytes: Sodium trending up today. Free water flushes at 350 mL q4h (increaed 9/15). Hypokalemic today which is likely dilutional as patient is fluid overloaded. Last HD session yesterday was abbreviated due to catheter dysfunction. Dialysis on hold pending vascular access. Magnesium and phosphate elevated above normal limits secondary to renal failure. BMP with am labs.     --Free water flushes increased to 350 mL q4h    --Give 20 mEq potassium packet VT x1  2. Constipation: Patient with bowel movement this morning. Per nephrology avoid [place] enemas. Constipating medications include fentanyl infusion. [name] also diabetic.     --Continue senna/docusate to 2 tabs TID    --Continue Miralax BID    --Stop lactulose 30 g TID  3. Glucose: History of persistent hyperglycemia this admission. [name] now off of insulin drip and transitioned to [place] insulin. Diet includes continuous tube feeds as tolerated. Corticosteroids discontinued 9/11. Glucoses improved after increasing basal insulin yesterday.     --Continue insulin glargine 40 units daily    --Continue 2 units insulin aspart q4h while on tube feeds    --Continue SSI q4h.   Pharmacy will continue to monitor and adjust per consult.   [place] Resident 12/04/2018 8:02 AM

## 2018-12-04 NOTE — Progress Notes (Signed)
Shift summary:  - Patient remains intubated and sedated. - Large BM this AM. - Sedation changed from Propofol to Precedex.

## 2018-12-04 NOTE — Progress Notes (Signed)
Nutrition Follow-up  DOCUMENTATION CODES:   Morbid obesity  INTERVENTION:  Continue Vital High Protein at 45 mL/hr (1080 mL goal daily volume) + Pro-Stat 60 mL TID per tube. Provides 1680 kcal, 185 grams of protein, 907 mL H2O daily.  Continue liquid MVI daily per tube.  Plan is for patient to be NPO after midnight for planned tracheostomy tube placement tomorrow.  With free water flush of 350 mL Q4hrs patient is receiving a total of 3007 mL H2O daily including water in tube feeding.  NUTRITION DIAGNOSIS:   Inadequate oral intake related to inability to eat as evidenced by NPO status.  Ongoing - addressing with TF regimen.  GOAL:   Provide needs based on ASPEN/SCCM guidelines  Met with TF regimen.  MONITOR:   Vent status, Labs, Weight trends, TF tolerance, Skin, I & O's  REASON FOR ASSESSMENT:   Ventilator    ASSESSMENT:   57 year old male with PMHx of CHF, A-fib, DM, gout, OSA, HTN admitted with acute hypoxic hypercarbic respiratory failure, bilateral pulmonary effusions, pulmonary edema, compressive atelectasis, was intubated 8/28-8/30, then re-intubated on 31.  Patient remains intubated and sedated. On PCV with FiO2 60%, Pressure Control 25 cmH2O, PEEP 8 cmH2O. Abdomen distended and taut per RN documentation. Patient finally had a large type 7 BM this AM and rectal tube was placed afterwards. Patient received HD on 9/11. Due to issues with access dialysis was not completed yesterday. Plan is for tracheostomy tube placement tomorrow. After placement plan is for small-bore NGT for enteral nutrition.  Enteral Access: OGT placed 8/31; terminates in distal stomach per abdominal x-ray 9/14 (after advancement yesterday); 78 cm at corner of mouth  MAP: 73-113 mmHg  TF: Vital High Protein at 45 mL/hr + Pro-Stat 60 mL TID + FWF 350 mL Q4hrs  [name] currently intubated on ventilator support Ve: 18.1 L/min Temp (24hrs), Avg:99.1 F (37.3 C), Min:98.8 F (37.1 C),  Max:99.9 F (37.7 C)  Propofol: N/A  Medications reviewed and include: clonazepam, Novolog 0-20 units Q4hrs, Novolog 2 units Q4hrs TF coverage, Lantus 40 units daily, lactulose 30 grams TID, liquid MVI daily, pantoprazole, Miralax, potassium chloride 20 mEq once today, Seroquel, senna-docusate 2 tablet TID, amiodarone gtt, fentanyl gtt, phenylephrine gtt at 20 mcg/min.  Labs reviewed: CBG 153-214, Sodium 149, Potassium 3.1, BUN 144, Creatinine 3.38, Anion gap 16, Phosphorus 6.2, Magnesium 2.9.  I/O: 3050 mL UOP yesterday (0.6 mL/kg/hr)  Weight trend: 220.9 kg on 9/15; +47.7 kg from 8/29  Discussed with RN and on rounds.  Diet Order:   Diet Order    None     EDUCATION NEEDS:   No education needs have been identified at this time  Skin:  Skin Assessment: Skin Integrity Issues:(MSAD to abdomen and groin)  Last BM:  12/04/2018 - large type 7  Height:   Ht Readings from Last 1 Encounters:  11/21/18 5' 9.02" (1.753 m)   Weight:   Wt Readings from Last 1 Encounters:  12/04/18 (!) 220.9 kg   Ideal Body Weight:  72.8 kg  BMI:  Body mass index is 71.88 kg/m.  Estimated Nutritional Needs:   Kcal:  1602-1820 (22-25 kcal/kg IBW)  Protein:  182 grams (2.5 grams/kg IBW)  Fluid:  2-2.2 L/day   Stephens, MS, RD, LDN Office: [phone] Pager: [phone] After Hours/Weekend Pager: [phone]  

## 2018-12-04 NOTE — TOC Initial Note (Signed)
Transition of Care [facility]) - Initial/Assessment Note    Patient Details  Name: Kyle White MRN: [id number] Date of Birth: Nov 26, 1960  Transition of Care [facility]) CM/SW Contact:    Kyle White, [place] Phone Number: 12/04/2018, 3:12 PM  Clinical Narrative: [facility] team consulted for medium risk of readmission. CSW unable to speak with patient at this time because he is on a ventilator and sedated. CSW spoke with patient's sister Kyle White. Sister states that patient lives with his girlfriend and was independent prior to hospitalization. Sister states that patient was on oxygen prior to admission but she is unsure how much oxygen he was using. CSW explained that patient will likely need a long term acute hospital (LTAC). Sister states that she does not want to make a decision about that until after patient has the tracheostomy on Wednesday and can come off the sedation. Sister states that she is unsure what patient would want and she wants this to be his decision. Sister will contact CSW once she has made a decision. CSW will continue to follow for discharge planning.                   Expected Discharge Plan: Long Term Acute Care (LTAC) Barriers to Discharge: Continued Medical Work up   Patient Goals and CMS Choice   CMS Medicare.gov Compare Post Acute Care list provided to:: Patient Represenative (must comment)(Sister) Choice offered to / list presented to : Sibling  Expected Discharge Plan and Services Expected Discharge Plan: [place] (LTAC)                                              Prior Living Arrangements/Services   Lives with:: Significant Other Patient language and need for interpreter reviewed:: Yes        Need for Family Participation in Patient Care: Yes (Comment) Care giver support system in place?: Yes (comment)   Criminal Activity/Legal Involvement Pertinent to Current Situation/Hospitalization: No - Comment as needed  Activities of Daily  Living Home Assistive Devices/Equipment: None ADL Screening (condition at time of admission) [name] cognitive ability adequate to safely complete daily activities?: Yes Is the patient deaf or have difficulty hearing?: No Does the patient have difficulty seeing, even when wearing glasses/contacts?: No Does the patient have difficulty concentrating, remembering, or making decisions?: No Patient able to express need for assistance with ADLs?: Yes Does the patient have difficulty dressing or bathing?: No Independently performs ADLs?: Yes (appropriate for developmental age) Does the patient have difficulty walking or climbing stairs?: Yes Weakness of Legs: Both Weakness of Arms/Hands: None  Permission Sought/Granted Permission sought to share information with : Case Manager, Customer service manager, Family Supports Permission granted to share information with : Yes, Verbal Permission Granted              Emotional Assessment Appearance:: Appears stated age Attitude/Demeanor/Rapport: Unresponsive Affect (typically observed): Unable to Assess   Alcohol / Substance Use: Not Applicable Psych Involvement: No (comment)  Admission diagnosis:  SOB (shortness of breath) [R06.02] COPD exacerbation ([place]) [J44.1] Patient Active Problem List   Diagnosis Date Noted  . COPD exacerbation ([place])   . Goals of care, counseling/discussion   . Palliative care by specialist   . DNR (do not resuscitate) discussion   . Acute exacerbation of CHF (congestive heart failure) ([place]) 10/31/2018  . Acute respiratory failure  with hypoxia and hypercapnia [facility])    PCP:  Inc, [place]:   [facility] [address] [place] [address] Phone: [phone] Fax: [phone]     Social Determinants of Health (SDOH) Interventions    Readmission Risk Interventions Readmission Risk Prevention Plan 12/04/2018  Transportation  Screening Complete  PCP or Specialist Appt within 3-5 Days Complete  HRI or [place] Complete  Social Work Consult for [place] Planning/Counseling Complete  Palliative Care Screening Patient Refused  Medication Review Press photographer) Referral to Pharmacy  Some recent data might be hidden

## 2018-12-05 ENCOUNTER — Encounter: Payer: Self-pay | Admitting: Certified Registered Nurse Anesthetist

## 2018-12-05 ENCOUNTER — Inpatient Hospital Stay: Payer: Medicare Other | Admitting: Registered Nurse

## 2018-12-05 ENCOUNTER — Encounter: Admission: EM | Disposition: E | Payer: Self-pay | Source: Home / Self Care | Attending: Internal Medicine

## 2018-12-05 ENCOUNTER — Inpatient Hospital Stay: Admission: EM | Disposition: E | Payer: Self-pay | Source: Home / Self Care | Attending: Internal Medicine

## 2018-12-05 DIAGNOSIS — N179 Acute kidney failure, unspecified: Secondary | ICD-10-CM

## 2018-12-05 HISTORY — PX: TRACHEOSTOMY TUBE PLACEMENT: SHX814

## 2018-12-05 LAB — GLUCOSE, CAPILLARY
Glucose-Capillary: 131 mg/dL — ABNORMAL HIGH (ref 70–99)
Glucose-Capillary: 131 mg/dL — ABNORMAL HIGH (ref 70–99)
Glucose-Capillary: 151 mg/dL — ABNORMAL HIGH (ref 70–99)
Glucose-Capillary: 175 mg/dL — ABNORMAL HIGH (ref 70–99)
Glucose-Capillary: 185 mg/dL — ABNORMAL HIGH (ref 70–99)
Glucose-Capillary: 228 mg/dL — ABNORMAL HIGH (ref 70–99)
Glucose-Capillary: 260 mg/dL — ABNORMAL HIGH (ref 70–99)

## 2018-12-05 LAB — CBC
HCT: 30.6 % — ABNORMAL LOW (ref 39.0–52.0)
Hemoglobin: 9.5 g/dL — ABNORMAL LOW (ref 13.0–17.0)
MCH: 29.6 pg (ref 26.0–34.0)
MCHC: 31 g/dL (ref 30.0–36.0)
MCV: 95.3 fL (ref 80.0–100.0)
Platelets: 47 10*3/uL — ABNORMAL LOW (ref 150–400)
RBC: 3.21 MIL/uL — ABNORMAL LOW (ref 4.22–5.81)
RDW: 14.2 % (ref 11.5–15.5)
WBC: 4.3 10*3/uL (ref 4.0–10.5)
nRBC: 2.6 % — ABNORMAL HIGH (ref 0.0–0.2)

## 2018-12-05 LAB — BASIC METABOLIC PANEL
Anion gap: 12 (ref 5–15)
BUN: 141 mg/dL — ABNORMAL HIGH (ref 6–20)
CO2: 24 mmol/L (ref 22–32)
Calcium: 7.9 mg/dL — ABNORMAL LOW (ref 8.9–10.3)
Chloride: 111 mmol/L (ref 98–111)
Creatinine, Ser: 3.29 mg/dL — ABNORMAL HIGH (ref 0.61–1.24)
GFR calc Af Amer: 23 mL/min — ABNORMAL LOW (ref >60)
GFR calc non Af Amer: 19 mL/min — ABNORMAL LOW (ref >60)
Glucose, Bld: 240 mg/dL — ABNORMAL HIGH (ref 70–99)
Potassium: 3.5 mmol/L (ref 3.5–5.1)
Sodium: 147 mmol/L — ABNORMAL HIGH (ref 135–145)

## 2018-12-05 LAB — TYPE AND SCREEN
ABO/RH(D): O POS
Antibody Screen: NEGATIVE

## 2018-12-05 LAB — PLATELET COUNT: Platelets: 57 10*3/uL — ABNORMAL LOW (ref 150–400)

## 2018-12-05 LAB — PROTIME-INR
INR: 1.3 — ABNORMAL HIGH (ref 0.8–1.2)
Prothrombin Time: 16 seconds — ABNORMAL HIGH (ref 11.4–15.2)

## 2018-12-05 SURGERY — CREATION, TRACHEOSTOMY
Anesthesia: General | Site: Neck

## 2018-12-05 SURGERY — PULMONARY THROMBECTOMY
Anesthesia: Moderate Sedation

## 2018-12-05 MED ORDER — MIDAZOLAM HCL 2 MG/2ML IJ SOLN
INTRAMUSCULAR | Status: AC
  Filled 2018-12-05: qty 2

## 2018-12-05 MED ORDER — ONDANSETRON HCL 4 MG/2ML IJ SOLN
INTRAMUSCULAR | Status: AC
  Filled 2018-12-05: qty 2

## 2018-12-05 MED ORDER — FENTANYL CITRATE (PF) 100 MCG/2ML IJ SOLN
INTRAMUSCULAR | Status: AC
  Filled 2018-12-05: qty 2

## 2018-12-05 MED ORDER — PHENYLEPHRINE HCL (PRESSORS) 10 MG/ML IV SOLN
INTRAVENOUS | Status: AC
  Filled 2018-12-05: qty 1

## 2018-12-05 MED ORDER — LIDOCAINE-EPINEPHRINE (PF) 1 %-1:200000 IJ SOLN
INTRAMUSCULAR | Status: DC | PRN
  Administered 2018-12-05: 4 mL

## 2018-12-05 MED ORDER — FENTANYL CITRATE (PF) 100 MCG/2ML IJ SOLN
INTRAMUSCULAR | Status: DC | PRN
  Administered 2018-12-05 (×2): 50 ug via INTRAVENOUS

## 2018-12-05 MED ORDER — MIDAZOLAM HCL 2 MG/2ML IJ SOLN
INTRAMUSCULAR | Status: DC | PRN
  Administered 2018-12-05: 2 mg via INTRAVENOUS

## 2018-12-05 MED ORDER — INSULIN ASPART 100 UNIT/ML ~~LOC~~ SOLN
4.0000 [IU] | SUBCUTANEOUS | Status: DC
  Administered 2018-12-05 – 2018-12-12 (×40): 4 [IU] via SUBCUTANEOUS
  Filled 2018-12-05 (×40): qty 1

## 2018-12-05 MED ORDER — VANCOMYCIN HCL 1.25 G IV SOLR
1250.0000 mg | Freq: Once | INTRAVENOUS | Status: AC
  Administered 2018-12-05: 1250 mg via INTRAVENOUS
  Filled 2018-12-05: qty 1250

## 2018-12-05 MED ORDER — PHENYLEPHRINE HCL (PRESSORS) 10 MG/ML IV SOLN
INTRAVENOUS | Status: DC | PRN
  Administered 2018-12-05 (×3): 200 ug via INTRAVENOUS
  Administered 2018-12-05 (×2): 100 ug via INTRAVENOUS
  Administered 2018-12-05: 300 ug via INTRAVENOUS
  Administered 2018-12-05 (×2): 200 ug via INTRAVENOUS

## 2018-12-05 MED ORDER — ROCURONIUM BROMIDE 100 MG/10ML IV SOLN
INTRAVENOUS | Status: DC | PRN
  Administered 2018-12-05: 50 mg via INTRAVENOUS

## 2018-12-05 MED ORDER — SODIUM CHLORIDE 0.9 % IV SOLN
INTRAVENOUS | Status: DC | PRN
  Administered 2018-12-05: 12:00:00 via INTRAVENOUS

## 2018-12-05 MED ORDER — ROCURONIUM BROMIDE 50 MG/5ML IV SOLN
INTRAVENOUS | Status: AC
  Filled 2018-12-05: qty 1

## 2018-12-05 MED ORDER — SODIUM CHLORIDE 0.9% IV SOLUTION
Freq: Once | INTRAVENOUS | Status: AC
  Administered 2018-12-05: 08:00:00 via INTRAVENOUS

## 2018-12-05 SURGICAL SUPPLY — 28 items
BLADE SURG 15 STRL LF DISP TIS (BLADE) ×1 IMPLANT
BLADE SURG 15 STRL SS (BLADE) ×2
BLADE SURG SZ11 CARB STEEL (BLADE) ×3 IMPLANT
CANISTER SUCT 1200ML W/VALVE (MISCELLANEOUS) ×3 IMPLANT
COVER WAND RF STERILE (DRAPES) ×3 IMPLANT
ELECT REM PT RETURN 9FT ADLT (ELECTROSURGICAL) ×3
ELECTRODE REM PT RTRN 9FT ADLT (ELECTROSURGICAL) ×1 IMPLANT
GLOVE PROTEXIS LATEX SZ 7.5 (GLOVE) ×3 IMPLANT
GOWN STRL REUS W/ TWL LRG LVL3 (GOWN DISPOSABLE) ×2 IMPLANT
GOWN STRL REUS W/TWL LRG LVL3 (GOWN DISPOSABLE) ×4
HEMOSTAT SURGICEL 2X3 (HEMOSTASIS) ×3 IMPLANT
HLDR TRACH TUBE NECKBAND 18 (MISCELLANEOUS) ×1 IMPLANT
HOLDER TRACH TUBE NECKBAND 18 (MISCELLANEOUS) ×2
NS IRRIG 500ML POUR BTL (IV SOLUTION) ×3 IMPLANT
PACK HEAD/NECK (MISCELLANEOUS) ×3 IMPLANT
SHEARS HARMONIC 9CM CVD (BLADE) ×3 IMPLANT
SPONGE DRAIN TRACH 4X4 STRL 2S (GAUZE/BANDAGES/DRESSINGS) ×3 IMPLANT
SUCTION FRAZIER HANDLE 10FR (MISCELLANEOUS) ×2
SUCTION TUBE FRAZIER 10FR DISP (MISCELLANEOUS) ×1 IMPLANT
SUT ETHILON 2 0 FS 18 (SUTURE) ×3 IMPLANT
SUT SILK 2 0 (SUTURE) ×2
SUT SILK 2-0 18XBRD TIE 12 (SUTURE) ×1 IMPLANT
SUT VIC AB 4-0 RB1 27 (SUTURE) ×2
SUT VIC AB 4-0 RB1 27X BRD (SUTURE) ×1 IMPLANT
TUBE TRACH 8.0 EXL PROX  CUF (TUBING) ×2
TUBE TRACH 8.0 EXL PROX CUF (TUBING) ×1 IMPLANT
TUBE TRACH SHILEY  6 DIST  CUF (TUBING) IMPLANT
TUBE TRACH SHILEY 8 DIST CUF (TUBING) IMPLANT

## 2018-12-05 NOTE — Transfer of Care (Signed)
Immediate Anesthesia Transfer of Care Note  Patient: Kyle White  Procedure(s) Performed: TRACHEOSTOMY (N/A Neck)  Patient Location: ICU  Anesthesia Type:General  Level of Consciousness: sedated  Airway & Oxygen Therapy: Patient remains intubated per anesthesia plan and Patient placed on Ventilator (see vital sign flow sheet for setting)  Post-op Assessment: Report given to RN and Post -op Vital signs reviewed and stable  Post vital signs: Reviewed and stable  Last Vitals:  Vitals Value Taken Time  BP 109/88   Temp    Pulse 117   Resp 20   SpO2 91     Last Pain:  Vitals:   12/14/2018 1120  TempSrc: Axillary  PainSc:          Complications: No apparent anesthesia complications

## 2018-12-05 NOTE — Progress Notes (Signed)
Patient off unit for trach placement at this time. VSS.

## 2018-12-05 NOTE — Progress Notes (Signed)
CRITICAL CARE NOTE 58 y.o.M with hx of type 2 diabetes, chronic AF, pickwickian syndrome, hypertension, congestive heart failure adm 8/28 via ED to ICU/SDU with acute on chronic respiratory failure felt to be due to CHF/pulmonary edema CC  follow up respiratory failure  SUBJECTIVE Patient remains critically ill Prognosis is guarded High fio2 Plan for trach today plt count 47 will transfuse   BP 108/80   Pulse 85   Temp 99.9 F (37.7 C) (Oral)   Resp 20   Ht 5' 9.02" (1.753 m)   Wt (!) 220 kg   SpO2 93%   BMI 71.59 kg/m    I/O last 3 completed shifts: In: 7408 [I.V.:2466.2; Other:30; NG/GT:4911.8] Out: 2376 [Urine:4255] No intake/output data recorded.  SpO2: 93 % O2 Flow Rate (L/min): 3 L/min FiO2 (%): 50 %  Vent Mode: PCV FiO2 (%):  [50 %-60 %] 50 % Set Rate:  [20 bmp] 20 bmp PEEP:  [8 cmH20] 8 cmH20 Plateau Pressure:  [25 cmH20-30 cmH20] 30 cmH20  CBC    Component Value Date/Time   WBC 4.3 11/23/2018 0413   RBC 3.21 (L) 11/20/2018 0413   HGB 9.5 (L) 12/16/2018 0413   HCT 30.6 (L) 12/07/2018 0413   PLT 47 (L) 11/29/2018 0413   MCV 95.3 12/13/2018 0413   MCH 29.6 11/28/2018 0413   MCHC 31.0 12/02/2018 0413   RDW 14.2 12/08/2018 0413   LYMPHSABS 0.7 12/03/2018 0435   MONOABS 0.2 12/03/2018 0435   EOSABS 0.2 12/03/2018 0435   BASOSABS 0.0 12/03/2018 0435   BMP Latest Ref Rng & Units 11/27/2018 12/04/2018 12/03/2018  Glucose 70 - 99 mg/dL 240(H) 181(H) 292(H)  BUN 6 - 20 mg/dL 141(H) 144(H) 148(H)  Creatinine 0.61 - 1.24 mg/dL 3.29(H) 3.38(H) 3.53(H)  Sodium 135 - 145 mmol/L 147(H) 149(H) 147(H)  Potassium 3.5 - 5.1 mmol/L 3.5 3.1(L) 3.5  Chloride 98 - 111 mmol/L 111 111 110  CO2 22 - 32 mmol/L _0 Calcium 8.9 - 10.3 mg/dL 7.9(L) 8.0(L) 8.1(L)    SIGNIFICANT EVENTS MAJOR EVENTS/TEST RESULTS: 08/28 admitted to ICU/SDU for BiPAP. PCCM consultation 08/28 emergently intubated 08/29 Echocardiogram: EF 55-60%. LV cavity mildly dilated. Diastolic  function could not be evaluated. Left atrium mildly dilated. RV not assessed. RA size not well visualized. 08/29 CTAP: No acute intra-abdominal process noted. No perforation. Trace ascites. Severe hepatic steatosis. Cholelithiasis. Small R and trace L pleural effusions. Bilateral lower lobe airspace disease may represent atelectasis but pneumonia is difficult to exclude, particularly on the R. 08/30 Extubated 08/30 Cardiology consultation: recommended diltiazem gtt for rapid AF and continue anticoagulation 08/31 reintubated emergently 08/31 bronchoscopy for mucous plugging and complete collapse of R lung 08/31 Palliative Care consultation to address goals of care 09/03 DNR established 09/05 Nephrology consultation for AKI 09/06 Renal EG:BTDVVO appearance of the kidneys 09/06 requiring 100% FiO2 and PEEP 15 cm H2O 09/07 ETT cuff leak, ETT changed 09/08 CTH: No acute intracranial abnormality seen 09/10 HD cath placed and intermittent HD initiated 09/11 + F/C. Improving vent requirements. 2 hrs of HD performed 09/12 improving vent requirements. Low-dose clonazepam and Duragesic patch initiated. Goals of care discussion with patient's sister and recommended tracheostomy tube. 09/13 Severe desaturation with increased purulent respiratory secretions and volume loss due to mucus plugging.Respiratory culture obtained. Antibiotics expanded to piperacillin-tazobactam. DNR confirmed with patient's sister. Continuous sedation infusions initiated 9/14 ETT dislodged, s/p bronch to adjust ETT, extubated and re-intubated Patient has tracheomegaly, plan for trach 9/15 remains on high fio2  INDWELLING  DEVICES:: ETT 08/28 >>08/30, 08/31 >>09/07 (cuff leak), 09/07 >> R IJ CVL 08/31 >> L IJ HD cath 09/10 >>  MICRO DATA: SARS CoV 2 PCR 08/28 >> NEG MRSA PCR 08/28 >> NEG Urine 08/28 >> NEG Resp 08/28 >>abundant GPC chains Blood 08/29 >> NEG BAL 09/03 >> Staph simulans, strep  mitis Blood 09/07 >> NEG RVP 09/09 >> NEG Resp 09/09 >> c/w NOF Resp 09/13 >>  ANTIMICROBIALS:  Cefepime 09/03 >>09/08  Vancomycin 09/07 >> 09/10 Meropenem 09/09 >> 09/11 Ceftriaxone 09/11 >>09/13 Pip-tazo 09/13 >>   REVIEW OF SYSTEMS  [name] UNABLE TO PROVIDE COMPLETE REVIEW OF SYSTEMS DUE TO SEVERE CRITICAL ILLNESS   PHYSICAL EXAMINATION:  GENERAL:critically ill appearing, +resp distress HEAD: Normocephalic, atraumatic.  EYES: Pupils equal, round, reactive to light.  No scleral icterus.  MOUTH: Moist mucosal membrane. NECK: Supple.  PULMONARY: +rhonchi, +wheezing CARDIOVASCULAR: S1 and S2. Regular rate and rhythm. No murmurs, rubs, or gallops.  GASTROINTESTINAL: Soft, nontender, -distended. No masses. Positive bowel sounds. No hepatosplenomegaly.  MUSCULOSKELETAL: No swelling, clubbing, or edema.  NEUROLOGIC: obtunded, GCS<8 SKIN:intact,warm,dry  MEDICATIONS: I have reviewed all medications and confirmed regimen as documented   CULTURE RESULTS   Recent Results (from the past 240 hour(s))  CULTURE, BLOOD (ROUTINE X 2) w Reflex to ID Panel     Status: None   Collection Time: 11/26/18 10:32 PM   Specimen: BLOOD  Result Value Ref Range Status   Specimen Description BLOOD RIGHT ANTECUBITAL  Final   Special Requests   Final    BOTTLES DRAWN AEROBIC AND ANAEROBIC Blood Culture adequate volume   Culture   Final    NO GROWTH 5 DAYS Performed at [facility] [place] [place], [place] [address]    Report Status 12/01/2018 FINAL  Final  CULTURE, BLOOD (ROUTINE X 2) w Reflex to ID Panel     Status: None   Collection Time: 11/26/18 10:32 PM   Specimen: BLOOD  Result Value Ref Range Status   Specimen Description BLOOD BLOOD RIGHT HAND  Final   Special Requests   Final    BOTTLES DRAWN AEROBIC AND ANAEROBIC Blood Culture adequate volume   Culture   Final    NO GROWTH 5 DAYS Performed at [facility], [address] [place],  [place] [address]    Report Status 12/01/2018 FINAL  Final  Respiratory Panel by PCR     Status: None   Collection Time: 11/28/18 10:36 AM   Specimen: Nasopharyngeal Swab; Respiratory  Result Value Ref Range Status   Adenovirus NOT DETECTED NOT DETECTED Final   Coronavirus 229E NOT DETECTED NOT DETECTED Final    Comment: (NOTE) The Coronavirus on the Respiratory Panel, DOES NOT test for the novel  Coronavirus (2019 nCoV)    Coronavirus HKU1 NOT DETECTED NOT DETECTED Final   Coronavirus NL63 NOT DETECTED NOT DETECTED Final   Coronavirus OC43 NOT DETECTED NOT DETECTED Final   Metapneumovirus NOT DETECTED NOT DETECTED Final   Rhinovirus / Enterovirus NOT DETECTED NOT DETECTED Final   Influenza A NOT DETECTED NOT DETECTED Final   Influenza B NOT DETECTED NOT DETECTED Final   Parainfluenza Virus 1 NOT DETECTED NOT DETECTED Final   Parainfluenza Virus 2 NOT DETECTED NOT DETECTED Final   Parainfluenza Virus 3 NOT DETECTED NOT DETECTED Final   Parainfluenza Virus 4 NOT DETECTED NOT DETECTED Final   Respiratory Syncytial Virus NOT DETECTED NOT DETECTED Final   Bordetella pertussis NOT DETECTED NOT DETECTED Final   Chlamydophila  pneumoniae NOT DETECTED NOT DETECTED Final   Mycoplasma pneumoniae NOT DETECTED NOT DETECTED Final    Comment: Performed at [facility], [place] [address] [place], [place] [address]  Culture, respiratory (non-expectorated)     Status: None   Collection Time: 11/28/18 11:29 AM   Specimen: Tracheal Aspirate; Respiratory  Result Value Ref Range Status   Specimen Description   Final    TRACHEAL ASPIRATE Performed at [facility], [address], [place], [place] [address]    Special Requests   Final    NONE Performed at [facility], [address] [place], [place] [address]    Gram Stain   Final    FEW WBC PRESENT, PREDOMINANTLY PMN FEW SQUAMOUS EPITHELIAL CELLS PRESENT RARE GRAM POSITIVE COCCI IN PAIRS    Culture   Final    FEW Consistent  with normal respiratory flora. Performed at [facility], [place] [address], [place], [place] [address]    Report Status 11/30/2018 FINAL  Final  Culture, respiratory (non-expectorated)     Status: None   Collection Time: 12/02/18  8:45 AM   Specimen: Tracheal Aspirate; Respiratory  Result Value Ref Range Status   Specimen Description   Final    TRACHEAL ASPIRATE Performed at [facility], [address] [place], [place] [address]    Special Requests   Final    NONE Performed at [facility], [address] [place], [place] [address]    Gram Stain   Final    NO WBC SEEN NO ORGANISMS SEEN Performed at [facility], [place] [address], [place], [place] [address]    Culture FEW CANDIDA ALBICANS  Final   Report Status 12/04/2018 FINAL  Final            Indwelling Urinary Catheter continued, requirement due to   Reason to continue Indwelling Urinary Catheter strict Intake/Output monitoring for hemodynamic instability   Central Line/ continued, requirement due to  Reason to continue [place] of central venous pressure or other hemodynamic parameters and poor IV access   Ventilator continued, requirement due to severe respiratory failure   Ventilator Sedation RASS 0 to -2      ASSESSMENT AND PLAN SYNOPSIS 58 y.o.M with hx of type 2 diabetes, chronic AF, pickwickian syndrome, hypertension, congestive heart failure adm 8/28 via ED to ICU/SDU with acute on chronic respiratory failure felt to be due to CHF/pulmonary edema complicated by progressive renal failure and resp failure and failure to wean from vent failed extubation x 1   Severe ACUTE Hypoxic and Hypercapnic Respiratory Failure -continue Full MV support -continue Bronchodilator Therapy -Wean Fio2 and PEEP as tolerated Failure to wean from vent Needs trach for survival  ACUTE DIASTOLIC CARDIAC FAILURE-  -oxygen as needed   ACUTE KIDNEY INJURY/Renal Failure -follow  chem 7 -follow UO -continue Foley Catheter-assess need -Avoid nephrotoxic agents -Recheck creatinine  HD as needed Vasc cath to be placed by VASC   NEUROLOGY - intubated and sedated - minimal sedation to achieve a RASS goal: -1    CARDIAC ICU monitoring  ID -continue IV abx as prescibed -follow up cultures  GI GI PROPHYLAXIS as indicated  NUTRITIONAL STATUS DIET-->TF's as tolerated Constipation protocol as indicated  ENDO - will use ICU hypoglycemic\Hyperglycemia protocol if indicated   ELECTROLYTES -follow labs as needed -replace as needed -pharmacy consultation and following   DVT/GI PRX ordered TRANSFUSIONS AS NEEDED MONITOR FSBS ASSESS the need for LABS as needed  Critical Care Time devoted to patient care services described in this note is 34 minutes.   Overall, patient is critically ill, prognosis is guarded.  Patient with Multiorgan failure and at high risk for cardiac arrest and death.  [name] DNR  Patient with very low chance of meaningful recovery  Corrin Parker, M.D.  [facility]  Medical Director [place] Director [facility] Cardio-Pulmonary Department

## 2018-12-05 NOTE — Op Note (Signed)
  OPERATIVE NOTE   PROCEDURE: 1. Ultrasound guidance for vascular access right femoral vein 2. Placement of a 30 cm triple lumen dialysis catheter right femoral vein  PRE-OPERATIVE DIAGNOSIS: 1.renal failure  POST-OPERATIVE DIAGNOSIS: Same  SURGEON: Leotis Pain, MD  ASSISTANT(S): None  ANESTHESIA: local  ESTIMATED BLOOD LOSS: Minimal   FINDING(S): 1. None  SPECIMEN(S): None  INDICATIONS:  [name] a 58 y.o.male who presents with renal failure and multisystem organ failure.  He is going for a tracheostomy today and his current temporary dialysis catheter is in the jugular vein and needs to be removed.  We are going to place a temporary dialysis catheter into his groin and remove his jugular PermCath.  Risks and benefits were discussed, and informed consent was obtained..  DESCRIPTION: After obtaining full informed written consent, the patient was laid flat in the bed. The right groin was sterilely prepped and draped in a sterile surgical field was created. The right femoral vein was visualized with ultrasound and found to be widely patent. It was then accessed under direct guidance without difficulty with a Seldinger needle and a permanent image was recorded. A J-wire was then placed. After skin nick and dilatation, a 30 cm triple-lumen dialysis catheter was placed over the wire and the wire was removed. The lumens withdrew dark red nonpulsatile blood and flushed easily with sterile saline. The catheter was secured to the skin with 3 nylon sutures. Sterile dressing was placed.  COMPLICATIONS: None  CONDITION: Stable  Leotis Pain 11/29/2018 10:46 AM  This note was created with Dragon Medical transcription system. Any errors in dictation are purely unintentional.

## 2018-12-05 NOTE — Progress Notes (Signed)
Inpatient Diabetes Program Recommendations  AACE/ADA: New Consensus Statement on Inpatient Glycemic Control (2015)  Target Ranges:  Prepandial:   less than 140 mg/dL      Peak postprandial:   less than 180 mg/dL (1-2 hours)      Critically ill patients:  140 - 180 mg/dL   Lab Results  Component Value Date   GLUCAP 185 (H) 11/26/2018   HGBA1C 7.3 (H) 11/17/2018    Review of Glycemic Control Results for Kyle White, Kyle White (MRN [id number]) as of 12/18/2018 10:05  Ref. Range 12/04/2018 07:18 12/04/2018 11:10 12/04/2018 16:31 12/04/2018 19:44 12/11/2018 00:52 11/22/2018 04:01 12/11/2018 08:24  Glucose-Capillary Latest Ref Range: 70 - 99 mg/dL 153 (H) 215 (H) 243 (H) 252 (H) 260 (H) 228 (H) 185 (H)   Diabetes history: DM 2 Outpatient Diabetes medications: Metformin 500 mg bid Current orders for Inpatient glycemic control:  Lantus 40 units Daily Novolog 0-20 units Q4 Novolog 2 units Tube Feed coverage Q4   A1c 7.3% on 8/29  Inpatient Diabetes Program Recommendations:    Noted trach placement today  Tube Feeds Vital HP 45 ml/hour  Consider increasing Tube Feed Coverage to 4-6 units Q4 hours while running.  (do not give if tube feeds are stopped or held)  Thanks,  Tama Headings RN, MSN, BC-ADM Inpatient Diabetes Coordinator Team Pager [phone] (8a-5p)

## 2018-12-05 NOTE — Progress Notes (Signed)
[facility], [place] 12/08/2018  Subjective:   LOS: 19 09/15 0701 - 09/16 0700 In: 6778.8 [I.V.:1837.1; NG/GT:4911.8] Out: 2305 [Urine:2305] Critically ill Vent assisted. Fio2 50 %  Sedation - fentanyl Continues to have large amount of edema New rt femoral cathter placed today   Objective:  Vital signs in last 24 hours:  Temp:  [98.1 F (36.7 C)-100.5 F (38.1 C)] 99.9 F (37.7 C) (09/16 0400) Pulse Rate:  [50-127] 69 (09/16 0930) Resp:  [18-22] 20 (09/16 0930) BP: (79-128)/(55-85) 107/77 (09/16 0930) SpO2:  [90 %-99 %] 93 % (09/16 0930) FiO2 (%):  [50 %-60 %] 50 % (09/16 0400) Weight:  [220 kg] 220 kg (09/16 0500)  Weight change: -0.902 kg Filed Weights   12/03/18 0400 12/04/18 0216 12/18/2018 0500  Weight: (!) 223.6 kg (!) 220.9 kg (!) 220 kg    Intake/Output:    Intake/Output Summary (Last 24 hours) at 12/04/2018 1012 Last data filed at 12/19/2018 0600 Gross per 24 hour  Intake 3618.54 ml  Output 1925 ml  Net 1693.54 ml     Physical Exam: General: critically ill apearing  HEENT ETT in place  Pulm/lungs Vent assisted  CVS/Heart Irregular, tachycardic  Abdomen:  Obese, dependent edema  Extremities: SCDs, + dependent edema  Neurologic: sedated  Skin: warm  Access: Rt femoral dialysis cathter in place   Rectal tube, Foley in place    Basic Metabolic Panel:  Recent Labs  Lab 11/30/18 0632 12/01/18 0419 12/02/18 0456 12/03/18 0435 12/04/18 0438 12/10/2018 0413  NA 147* 146* 147* 147* 149* 147*  K 4.2 4.0 4.3 3.5 3.1* 3.5  CL 111 111 111 110 111 111  CO2 23 25 26 22 22 24   GLUCOSE 241* 302* 252* 292* 181* 240*  BUN 115* 111* 128* 148* 144* 141*  CREATININE 3.11* 3.07* 3.27* 3.53* 3.38* 3.29*  CALCIUM 7.7* 7.9* 8.3* 8.1* 8.0* 7.9*  MG 2.8*  --   --   --  2.9*  --   PHOS 7.1*  --   --   --  6.2*  --      CBC: Recent Labs  Lab 11/30/18 6979 12/01/18 0419 12/02/18 0456 12/03/18 0435 12/04/18 0438 12/19/2018 0413   WBC 8.9 9.1 13.2* 8.4 5.1 4.3  NEUTROABS 8.3*  --   --  7.2  --   --   HGB 13.0 12.4* 12.7* 10.6* 9.9* 9.5*  HCT 40.4 39.3 41.6 32.3* 31.4* 30.6*  MCV 93.7 94.9 97.9 93.4 94.6 95.3  PLT 45* 46* 61* 57* 46* 47*      Lab Results  Component Value Date   HEPBSAG Negative 11/29/2018   HEPBSAB Non Reactive 11/29/2018   HEPBIGM Negative 11/29/2018      Microbiology:  Recent Results (from the past 240 hour(s))  CULTURE, BLOOD (ROUTINE X 2) w Reflex to ID Panel     Status: None   Collection Time: 11/26/18 10:32 PM   Specimen: BLOOD  Result Value Ref Range Status   Specimen Description BLOOD RIGHT ANTECUBITAL  Final   Special Requests   Final    BOTTLES DRAWN AEROBIC AND ANAEROBIC Blood Culture adequate volume   Culture   Final    NO GROWTH 5 DAYS Performed at [facility], [address] [place], [place] [address]    Report Status 12/01/2018 FINAL  Final  CULTURE, BLOOD (ROUTINE X 2) w Reflex to ID Panel     Status: None   Collection Time: 11/26/18 10:32 PM   Specimen:  BLOOD  Result Value Ref Range Status   Specimen Description BLOOD BLOOD RIGHT HAND  Final   Special Requests   Final    BOTTLES DRAWN AEROBIC AND ANAEROBIC Blood Culture adequate volume   Culture   Final    NO GROWTH 5 DAYS Performed at [facility], [address] [place], [place] [address]    Report Status 12/01/2018 FINAL  Final  Respiratory Panel by PCR     Status: None   Collection Time: 11/28/18 10:36 AM   Specimen: Nasopharyngeal Swab; Respiratory  Result Value Ref Range Status   Adenovirus NOT DETECTED NOT DETECTED Final   Coronavirus 229E NOT DETECTED NOT DETECTED Final    Comment: (NOTE) The Coronavirus on the Respiratory Panel, DOES NOT test for the novel  Coronavirus (2019 nCoV)    Coronavirus HKU1 NOT DETECTED NOT DETECTED Final   Coronavirus NL63 NOT DETECTED NOT DETECTED Final   Coronavirus OC43 NOT DETECTED NOT DETECTED Final   Metapneumovirus NOT DETECTED NOT  DETECTED Final   Rhinovirus / Enterovirus NOT DETECTED NOT DETECTED Final   Influenza A NOT DETECTED NOT DETECTED Final   Influenza B NOT DETECTED NOT DETECTED Final   Parainfluenza Virus 1 NOT DETECTED NOT DETECTED Final   Parainfluenza Virus 2 NOT DETECTED NOT DETECTED Final   Parainfluenza Virus 3 NOT DETECTED NOT DETECTED Final   Parainfluenza Virus 4 NOT DETECTED NOT DETECTED Final   Respiratory Syncytial Virus NOT DETECTED NOT DETECTED Final   Bordetella pertussis NOT DETECTED NOT DETECTED Final   Chlamydophila pneumoniae NOT DETECTED NOT DETECTED Final   Mycoplasma pneumoniae NOT DETECTED NOT DETECTED Final    Comment: Performed at [facility], [place]. [address] [place], [place] [address]  Culture, respiratory (non-expectorated)     Status: None   Collection Time: 11/28/18 11:29 AM   Specimen: Tracheal Aspirate; Respiratory  Result Value Ref Range Status   Specimen Description   Final    TRACHEAL ASPIRATE Performed at [facility], [address], [place], [place] [address]    Special Requests   Final    NONE Performed at [facility], [address] [place], [place] [address]    Gram Stain   Final    FEW WBC PRESENT, PREDOMINANTLY PMN FEW SQUAMOUS EPITHELIAL CELLS PRESENT RARE GRAM POSITIVE COCCI IN PAIRS    Culture   Final    FEW Consistent with normal respiratory flora. Performed at [facility], [place] [address], [place], [place] [address]    Report Status 11/30/2018 FINAL  Final  Culture, respiratory (non-expectorated)     Status: None   Collection Time: 12/02/18  8:45 AM   Specimen: Tracheal Aspirate; Respiratory  Result Value Ref Range Status   Specimen Description   Final    TRACHEAL ASPIRATE Performed at [facility], [address], [place], [place] [address]    Special Requests   Final    NONE Performed at [facility], [address] [place], [place] [address]    Gram Stain   Final    NO WBC SEEN NO  ORGANISMS SEEN Performed at [facility], [place] [address], [place], [place] [address]    Culture FEW CANDIDA ALBICANS  Final   Report Status 12/04/2018 FINAL  Final    Coagulation Studies: Recent Labs    12/09/2018 0413  LABPROT 16.0*  INR 1.3*    Urinalysis: No results for input(s): COLORURINE, LABSPEC, PHURINE, GLUCOSEU, HGBUR, BILIRUBINUR, KETONESUR, PROTEINUR, UROBILINOGEN,  NITRITE, LEUKOCYTESUR in the last 72 hours.  Invalid input(s): APPERANCEUR    Imaging: Dg Abd 1 View  Result Date: 12/03/2018 CLINICAL DATA:  Check gastric catheter placement EXAM: ABDOMEN - 1 VIEW COMPARISON:  Film from earlier in the same day. FINDINGS: Gastric catheter has been advanced several cm and now lies with the tip in the distal stomach. IMPRESSION: Gastric catheter within the stomach. Electronically Signed   By: Inez Catalina M.D.   On: 12/03/2018 20:50   Dg Abd 1 View  Result Date: 12/03/2018 CLINICAL DATA:  Check gastric catheter placement EXAM: ABDOMEN - 1 VIEW COMPARISON:  11/30/2018 FINDINGS: Gastric catheter is noted within the distal esophagus but does not appear to have reached the gastric lumen. Scattered large and small bowel gas is noted. IMPRESSION: Gastric catheter within the distal esophagus. This should be advanced several cm. Electronically Signed   By: Inez Catalina M.D.   On: 12/03/2018 20:50   Dg Chest Port 1 View  Result Date: 12/03/2018 CLINICAL DATA:  Check endotracheal tube placement EXAM: PORTABLE CHEST 1 VIEW COMPARISON:  Film from earlier in the same day. FINDINGS: Endotracheal tube has been advanced and now lies approximately 2.8 cm above the carina. Gastric catheter is again seen although the tip of the catheter is not appreciated on this exam. Bilateral jugular central lines are again seen and stable. Increasing opacification in the right hemithorax is noted likely related to a combination of consolidation and effusion. The left lung is clear with the exception of  vascular congestion. IMPRESSION: Tubes and lines as described. Persistent vascular congestion. Increasing right hemithorax opacification consistent with a combination of consolidation and effusion. Electronically Signed   By: Inez Catalina M.D.   On: 12/03/2018 20:49   Dg Chest Port 1 View  Result Date: 12/03/2018 CLINICAL DATA:  Endotracheal tube adjustment EXAM: PORTABLE CHEST 1 VIEW COMPARISON:  Chest radiograph from earlier today. FINDINGS: Endotracheal tube tip is 7.0 cm above the carina, at the level of thoracic inlet. Enteric tube enters stomach with the tip not seen on this image. Left internal jugular central venous catheter terminates in the left brachiocephalic vein near the midline. Right internal jugular central venous catheter terminates in the middle third of the SVC. Stable cardiomediastinal silhouette with moderate cardiomegaly. No pneumothorax. Stable small bilateral pleural effusions, noting portion of the left costophrenic angle not included on this image. Moderate pulmonary edema appears similar. Bibasilar opacity, favor atelectasis, unchanged. IMPRESSION: 1. Endotracheal tube tip 7.0 cm above the carina, located at the level of the thoracic inlet. Additional support structures as detailed. No pneumothorax. 2. Stable moderate congestive heart failure with small bilateral pleural effusions and bibasilar lung opacities, favor atelectasis. Electronically Signed   By: Ilona Sorrel M.D.   On: 12/03/2018 19:48   Dg Chest Port 1 View  Result Date: 12/03/2018 CLINICAL DATA:  Check endotracheal tube placement EXAM: PORTABLE CHEST 1 VIEW COMPARISON:  12/03/2018 FINDINGS: Endotracheal tube is again noted at the level of the thoracic inlet. Gastric catheter is noted extending into the stomach. Bilateral jugular central lines are again seen and stable. Diffuse vascular congestion is noted. Mild interstitial edema is seen. Cardiomegaly is noted. Likely small posterior effusions are present as well. No  bony abnormality is seen. IMPRESSION: Vascular congestion and edema with mild effusions. Electronically Signed   By: Inez Catalina M.D.   On: 12/03/2018 19:19     Medications:   . sodium chloride Stopped (12/03/18 0527)  . amiodarone 60 mg/hr (12/19/2018 0600)  .  dexmedetomidine (PRECEDEX) IV infusion 0.5 mcg/kg/hr (11/25/2018 0744)  . feeding supplement (VITAL HIGH PROTEIN) Stopped (11/22/2018 0020)  . fentaNYL infusion INTRAVENOUS 150 mcg/hr (12/11/2018 0600)  . phenylephrine (NEO-SYNEPHRINE) Adult infusion 20 mcg/min (12/04/2018 0600)   . aspirin  81 mg Per NG tube Daily  . chlorhexidine gluconate (MEDLINE KIT)  15 mL Mouth Rinse BID  . Chlorhexidine Gluconate Cloth  6 each Topical Daily  . clonazePAM  1 mg Per Tube Daily  . clonazePAM  2 mg Per Tube QHS  . feeding supplement (PRO-STAT SUGAR FREE 64)  60 mL Per Tube TID  . free water  350 mL Per Tube Q4H  . insulin aspart  0-20 Units Subcutaneous Q4H  . insulin aspart  2 Units Subcutaneous Q4H  . insulin glargine  40 Units Subcutaneous Daily  . ipratropium-albuterol  3 mL Nebulization Q6H  . mouth rinse  15 mL Mouth Rinse 10 times per day  . multivitamin  15 mL Per Tube Daily  . pantoprazole sodium  40 mg Per Tube QHS  . QUEtiapine  25 mg Per Tube QHS  . QUEtiapine  50 mg Per Tube QHS  . senna-docusate  2 tablet Per Tube TID  . sodium chloride flush  10-40 mL Intracatheter Q12H  . vancomycin variable dose per unstable renal function (pharmacist dosing)   Does not apply See admin instructions   sodium chloride, acetaminophen, bisacodyl, fentaNYL, metoprolol tartrate, midazolam, sodium chloride flush  Assessment/ Plan:  58 y.o. male with with atrial fibrillation on warfarin, congestive heart failure, diabetes mellitus type 2, obstructive sleep apnea,morbid obesity, gout, mitral valve replacementwho [facility]for acute exacerbation of COPD  Active Problems:   Acute exacerbation of CHF (congestive heart failure)  ([place])   Acute respiratory failure with hypoxia and hypercapnia ([facility])   Goals of care, counseling/discussion   Palliative care by specialist   DNR (do not resuscitate) discussion   COPD exacerbation ([place])   #. ARF on CKD st3 Recent Labs    12/02/18 0456 12/03/18 0435 12/04/18 0438 11/24/2018 0413  CREATININE 3.27* 3.53* 3.38* 3.29*  S Creatinine remains elevated BUN is critically high UOP about 2300 cc Patient will benefit from HD to correct uremia  Plan for HD tomorrow since he is scheduled for trach surgery today - Avoid nephrotoxins such as NSAIDs, iv contrast, Fleets enema  # Hypokalemia, hypernatremia - replace prn - agree with free water replacement  #. Anemia    Lab Results  Component Value Date   HGB 9.5 (L) 12/19/2018   # Acute resp failure - Vent dependent - Fio2 50% (worse) - trach planned for today  # generalized Edema - 2 D echo from 8/29 shows LVEF 55-60% (nomral). Abnormal severe thickening of aortic wall Tricuspid valve and Rt vent were not assesed - Amiodarone for A Fib - not on diuretic, will attempt UF with HD   #. Diabetes type 2 with CKD Hgb A1c MFr Bld (%)  Date Value  11/17/2018 7.3 (H)   [name] sister [name] is in the room - updated and discussed patient condition    LOS: [place] 9/16/202010:12 [place], [place]

## 2018-12-05 NOTE — Consult Note (Signed)
Pharmacy Antibiotic Note  Kyle White is a 58 y.o. male admitted on 10/27/2018 with pneumonia. 9/3 BAL significant for Staph simulans and Strep mitis/oralis. Patient had received approximately 4 days of vancomycin therapy this admission (last dose 9/9). Meropenem was started on 9/9 for persistent fevers, concern for pseudomonas, and head CT significant for sinusitis. Meropenem and vancomycin were d/c 9/10 and ceftriaxone monotherapy was started. Ceftriaxone was then changed to Zosyn on 9/13 after concern for possible aspiration pneumonia. Per discussion on rounds plan is to discontinue Zosyn and re-start vancomycin as duration of therapy was determined to be inadequate. Pharmacy consulted for vancomycin dosing for Staph simulans and Strep mitis/oralis pneumonia. 9/14 CXR significant for stable bibasilar opacities with small pleural effusions. Patient also with acute renal failure secondary to acute cardiorenal syndrome receiving intermittent HD for uremia and fluid overload. Tentative plan is to treat for 4-5 days.   Patient remains intubated and is s/p tracheostomy today. WBC are trending down, patient with low grade fever overnight. Last HD session 9/14 terminated after ~30 minutes due to catheter dysfunction. Patient with new right femoral catheter with plan for HD tomorrow, 9/17.   Day 3 of therapy. Patient has received LD of vancomycin 2 g x1 so far. 9/15 VR of 19 (approximately 24 hours after dose).    Plan:   Will give vancomycin 1.25 g x1 today.   Follow-up dialysis plan tomorrow. Will tentatively plan to give a dose tomorrow with dialysis.   Height: 5' 9.02" (175.3 cm) Weight: (!) 485 lb 0.2 oz (220 kg) IBW/kg (Calculated) : 70.74  Temp (24hrs), Avg:98.9 F (37.2 C), Min:98 F (36.7 C), Max:100.5 F (38.1 C)  Recent Labs  Lab 11/30/18 0632 12/01/18 0419 12/02/18 0456 12/03/18 0435 12/04/18 0438 12/04/18 1400 11/20/2018 0413  WBC 8.9 9.1 13.2* 8.4 5.1  --  4.3  CREATININE  3.11* 3.07* 3.27* 3.53* 3.38*  --  3.29*  VANCORANDOM 22  --   --   --   --  19  --     Estimated Creatinine Clearance: 44.6 mL/min (A) (by C-G formula based on SCr of 3.29 mg/dL (H)).    No Active Allergies  Antimicrobials this admission: Cefepime 9/3 >> 9/8 Vancomycin 9/7 >> 9/10, 9/14 >> Meropenem 9/9 >> 9/10 Ceftriaxone 9/11 >> 9/12 Zosyn 9/13 >> 9/14  Dose adjustments this admission:  9/9: Vancomycin decreased from 1750 mg Q24H to 1250 mg Q24H due to serum creatinine bump  9/10: Meropenem decreased from 1g Q12H to 500 mg Q24H due to patient starting intermittent HD. Vancomycin transitioned to Dialysis dosing.   Microbiology results: 9/13 tracheal aspirate: few candida albicans 9/9 respiratory culture: rare GPC in pairs 9/9 respiratory panel: none detected 9/7 Bcx: no growth  9/3 BAL: Staph simulans & Strep mitis/oralis, vancomycin susceptible 8/29 Bcx: No growth 8/28 Ucx: No growth 8/28 MRSA PCR: negative  Thank you for allowing pharmacy to be a part of this patient's care.  [place] Resident 12/11/2018 2:11 PM

## 2018-12-05 NOTE — Progress Notes (Signed)
Pt returned to unit at this time from tracheostomy placement. VSS.

## 2018-12-05 NOTE — Anesthesia Post-op Follow-up Note (Signed)
Anesthesia QCDR form completed.        

## 2018-12-05 NOTE — Anesthesia Procedure Notes (Signed)
Date/Time: 12/07/2018 11:25 AM Performed by: Eben Burow, CRNA Pre-anesthesia Checklist: Patient identified, Emergency Drugs available, Suction available and Patient being monitored Patient Re-evaluated:Patient Re-evaluated prior to induction Oxygen Delivery Method: Circle system utilized Preoxygenation: Pre-oxygenation with 100% oxygen Induction Type: Inhalational induction with existing ETT Tube type: Oral Tube size: 8.5 mm Placement Confirmation: positive ETCO2 and breath sounds checked- equal and bilateral Tube secured with: Tape

## 2018-12-05 NOTE — Progress Notes (Signed)
[facility] [place] NAME: Kyle White    MR#:  [id number]  DATE OF BIRTH:  May 01, 1960  SUBJECTIVE:  Pt is afebrile,remains intubated on the ventilator, severely hypoxemic with underlying pickwickian syndrome,unsuccessful weaning trials.  Critically ill, uremic, started hemodialysis--issues with catheter clotting  underwent bronchoscopy 11/22/18.  Palliative care team is involved   patient now on IV pressers, amiodarone, fentanyl, precedex  S/p trach today S/p right femoral catheter REVIEW OF SYSTEMS:   Review of Systems  Unable to perform ROS: Intubated  Psychiatric/Behavioral: Nervous/anxious:      DRUG ALLERGIES:   No Active Allergies  VITALS:  Blood pressure 126/86, pulse (!) 129, temperature 98.4 F (36.9 C), temperature source Axillary, resp. rate 17, height 5' 9.02" (1.753 m), weight (!) 220 kg, SpO2 97 %.  PHYSICAL EXAMINATION:   Physical Exam  GENERAL:  58 y.o.-year-old patient lying in the bed with no acute distress. Morbidly obese Morbidly obese Critically ill intubated on the vent EYES: Pupils equal, round, reactive to light and accommodation. No scleral icterus. Extraocular muscles intact.  HEENT: Head atraumatic, normocephalic. Oropharynx and nasopharynx clear. TRACH+ NECK:  Supple, no jugular venous distention. No thyroid enlargement, no tenderness.  LUNGS: Mod breath sounds bilaterally, positive wheezing, rales, rhonchi. CARDIOVASCULAR: S1, S2 normal. No murmurs, rubs, or gallops.  ABDOMEN: Soft, nontender, nondistended. Bowel sounds present. No organomegaly or mass.  EXTREMITIES: edema+NEUROLOGIC: sedated GCS less than 8  right groin HD cath PSYCHIATRIC: sedated  and on the vent SKIN: per RN documetnation  LABORATORY PANEL:  CBC Recent Labs  Lab 12/14/2018 0413  WBC 4.3  HGB 9.5*  HCT 30.6*  PLT 47*    Chemistries  Recent Labs  Lab 12/03/18 0435 12/04/18 0438 12/03/2018 0413  NA 147* 149* 147*   K 3.5 3.1* 3.5  CL 110 111 111  CO2 22 22 24   GLUCOSE 292* 181* 240*  BUN 148* 144* 141*  CREATININE 3.53* 3.38* 3.29*  CALCIUM 8.1* 8.0* 7.9*  MG  --  2.9*  --   AST 35  --   --   ALT 223*  --   --   ALKPHOS 114  --   --   BILITOT 0.8  --   --    Cardiac Enzymes No results for input(s): TROPONINI in the last 168 hours. RADIOLOGY:  Dg Abd 1 View  Result Date: 12/03/2018 CLINICAL DATA:  Check gastric catheter placement EXAM: ABDOMEN - 1 VIEW COMPARISON:  Film from earlier in the same day. FINDINGS: Gastric catheter has been advanced several cm and now lies with the tip in the distal stomach. IMPRESSION: Gastric catheter within the stomach. Electronically Signed   By: Inez Catalina M.D.   On: 12/03/2018 20:50   Dg Abd 1 View  Result Date: 12/03/2018 CLINICAL DATA:  Check gastric catheter placement EXAM: ABDOMEN - 1 VIEW COMPARISON:  11/30/2018 FINDINGS: Gastric catheter is noted within the distal esophagus but does not appear to have reached the gastric lumen. Scattered large and small bowel gas is noted. IMPRESSION: Gastric catheter within the distal esophagus. This should be advanced several cm. Electronically Signed   By: Inez Catalina M.D.   On: 12/03/2018 20:50   Dg Chest Port 1 View  Result Date: 12/03/2018 CLINICAL DATA:  Check endotracheal tube placement EXAM: PORTABLE CHEST 1 VIEW COMPARISON:  Film from earlier in the same day. FINDINGS: Endotracheal tube has been advanced and now lies approximately 2.8 cm above the carina. Gastric  catheter is again seen although the tip of the catheter is not appreciated on this exam. Bilateral jugular central lines are again seen and stable. Increasing opacification in the right hemithorax is noted likely related to a combination of consolidation and effusion. The left lung is clear with the exception of vascular congestion. IMPRESSION: Tubes and lines as described. Persistent vascular congestion. Increasing right hemithorax opacification  consistent with a combination of consolidation and effusion. Electronically Signed   By: Inez Catalina M.D.   On: 12/03/2018 20:49   Dg Chest Port 1 View  Result Date: 12/03/2018 CLINICAL DATA:  Endotracheal tube adjustment EXAM: PORTABLE CHEST 1 VIEW COMPARISON:  Chest radiograph from earlier today. FINDINGS: Endotracheal tube tip is 7.0 cm above the carina, at the level of thoracic inlet. Enteric tube enters stomach with the tip not seen on this image. Left internal jugular central venous catheter terminates in the left brachiocephalic vein near the midline. Right internal jugular central venous catheter terminates in the middle third of the SVC. Stable cardiomediastinal silhouette with moderate cardiomegaly. No pneumothorax. Stable small bilateral pleural effusions, noting portion of the left costophrenic angle not included on this image. Moderate pulmonary edema appears similar. Bibasilar opacity, favor atelectasis, unchanged. IMPRESSION: 1. Endotracheal tube tip 7.0 cm above the carina, located at the level of the thoracic inlet. Additional support structures as detailed. No pneumothorax. 2. Stable moderate congestive heart failure with small bilateral pleural effusions and bibasilar lung opacities, favor atelectasis. Electronically Signed   By: Ilona Sorrel M.D.   On: 12/03/2018 19:48   Dg Chest Port 1 View  Result Date: 12/03/2018 CLINICAL DATA:  Check endotracheal tube placement EXAM: PORTABLE CHEST 1 VIEW COMPARISON:  12/03/2018 FINDINGS: Endotracheal tube is again noted at the level of the thoracic inlet. Gastric catheter is noted extending into the stomach. Bilateral jugular central lines are again seen and stable. Diffuse vascular congestion is noted. Mild interstitial edema is seen. Cardiomegaly is noted. Likely small posterior effusions are present as well. No bony abnormality is seen. IMPRESSION: Vascular congestion and edema with mild effusions. Electronically Signed   By: Inez Catalina M.D.    On: 12/03/2018 19:19   ASSESSMENT AND PLAN:  Kyle White  is a 58 y.o. male with a known history of atrial fibrillation on Coumadin, CHF, diabetes mellitus, and obstructive sleep apnea with CPAP use at home, morbid obesity with sedentary lifestyle on oxygen at 2 L/min at home.  1.Acute on chronic  respiratory failure with severe hypoxia- underlying pickwickian syndrome -No clinical improvement, unsuccessful weaning trials--s/p tracheostomy 11/21/2018 -  -patient underwent bronchoscopy 11/22/2018. 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 Acute renal failure on chronic kidney disease stage III--NOW on HD  with baseline creatinine 1.28-1.89-1.87-2.25-2.21-3.05--3.17-3.11 -Repeat BMP in the a.m. and continue to monitor renal function closely -Patient being uremic nephrology started the patient on hemodialysis -issues with [facility] cath--has right groin temp cath +  #  Type IIDiabetes mellitus -Sliding scale insulin + lantus -Hemoglobin A1c 7.3  Overall poor prognosis. Appreciate palliative care input.   CODE STATUS: DNR  DVT Prophylaxis: scd as patient is thrombocytopenic  TOTAL TIME TAKING CARE OF THIS PATIENT:25 minutes.  >50% time spent on counselling and coordination of care  POSSIBLE D/C IN *?* DAYS, DEPENDING ON CLINICAL CONDITION.  Note: This dictation was prepared with Dragon dictation along with smaller phrase technology. Any transcriptional errors that result from this  process are unintentional.   Fritzi Mandes M.D on 12/07/2018 at 1:19 PM  Between 7am to 6pm - Pager - [phone]  After 6pm go to [email or web address] - password [email or web address] [place] [facility]  Office  [phone]  CC: Primary care physician; Inc, [place] ServicesPatient ID: Kyle White, male   DOB: 1961-02-15, 58 y.o.   MRN: [id number]

## 2018-12-05 NOTE — Op Note (Signed)
11/27/2018 12:29 PM  Kyle White [id number]  Pre-Op Dx: Ventilator dependent, prolonged intubation with failure to wean  Post-Op Dx: Same  Proc:  Tracheostomy  Surg:  Huey Romans   assist: Carloyn Manner  Anes:  GOT  EBL: 20 mL  Comp: None  Findings: Extremely large neck with his larynx and trachea low and deep in the neck.  The thyroid gland had to be split at the isthmus.  An incision was made in the trachea between the second and third rings and an inferiorly based Bjork flap was created.  An 8 Shiley XLT proximal length tube was placed.  Procedure:  The patient was brought from the intensive care unit to the operating room and transferred to an operating table.  Anesthesia was administered per indwelling orotracheal tube.   Neck extension was achieved as possible anda shoulder rolke was placed.  The lower neck was palpated with the findings as described above.  1% Xylocaine with 1:100,000 epinephrine, 4 cc's, was infiltrated into the surgical field for intraoperative hemostasis.  Several minutes were allowed for this to take effect. The patient was prepped in a sterile fashion with a surgical prep from the chin down to the upper chest.  Sterile draping was accomplished in the standard fashion.  A 4 cm horizontal incision was made sharply a finger's breadth above the sternal notch, and extended through skin and subcutaneous fat.  Using cautery, the superficial layer of the deep cervical fascia was lysed.  Additional dissection revealed the strap muscles.  The midline raphe was divided in two layers and the muscles retracted laterally.  The pretracheal plane was visualized.  This was entered bluntly.  The thyroid isthmus was isolated and divided with the Harmonic scalpel.  The thyroid gland was retracted to either side.  The anterior face of the trachea was cleared.  In the third interspace, a transverse incision was made between cartilage rings into the tracheal lumen.  A 6 mm wide  inferiorly based flap was generated and secured to the lower wound with a 4-0 chromic suture.   A previously tested  #8 Shiley XLT proximal cuffed tracheostomy tube was brought into the field.  With the endotracheal tube under direct visualization through the tracheostomy, it was gently backed up.  The tracheostomy tube was inserted into the tracheal lumen.  Hemostasis was observed. The cuff was inflated and observed to be intact and containing pressure. The inner cannula was placed and ventilation assumed per tracheostomy tube.  Good chest wall motion was observed, and CO2 was documented per anesthesia.  The trach tube was secured in the standard fashion with trach ties. A 2-0 Nylon suture was used to secure the trach tube to the skin on both sides.  Surgicel was placed into the wound around the trach tube.  Hemostasis was observed again.  When satisfactory ventilation was assured, the orotracheal tube was removed.  At this point the procedure was completed.  The patient was returned to anesthesia, awakened as possible, and transferred back to the intensive care unit in stable condition.  Comment: 58 y.o. white male with prolonged ventilation was the indication for today's procedure.  Anticipate a routine postoperative recovery including standard tracheal hygiene.  The sutures should be removed in 5 days.  When the patient no longer requires ventilator or pressure support, the cuff should be deflated.  Changing to an uncuffed tube and downsizing will be according to the clinical condition of the patient.   Kyle White Kyle White  12:29 PM 11/28/2018

## 2018-12-05 NOTE — Progress Notes (Signed)
Spoke to Dr. Mortimer Fries about transfusing 2nd unit of platelets. Dr. Mortimer Fries wants platelet count rechecked before administering.

## 2018-12-05 NOTE — Progress Notes (Signed)
Spoke with Hinton Dyer, NP about 2nd unit of platelets after recheck platelet count of 57. No more platelets needed at this time.

## 2018-12-05 NOTE — Progress Notes (Signed)
Pharmacy Electrolyte Monitoring Consult:  Pharmacy consulted to assist in monitoring and replacing electrolytes in this 58 y.o. male admitted on 10/31/2018. [name] currently intubated and sedated on dexmedetomidine and fentanyl infusion.   Labs:  Sodium (mmol/L)  Date Value  11/30/2018 147 (H)   Potassium (mmol/L)  Date Value  11/20/2018 3.5   Magnesium (mg/dL)  Date Value  12/04/2018 2.9 (H)   Phosphorus (mg/dL)  Date Value  12/04/2018 6.2 (H)   Calcium (mg/dL)  Date Value  12/08/2018 7.9 (L)   Albumin (g/dL)  Date Value  12/03/2018 1.8 (L)   Corrected Calcium: 9.7  Assessment/Plan: 1. Electrolytes: Sodium improved today but continues to be mildly elevated. Free water flushes at 350 mL q4h (increaed 9/15). Potassium improved to lower range of normal with 20 mEq oral supplementation yesterday. Next HD session planned for tomorrow.     --Continue water flushes at 350 mL q4h    --Defer repletion of electrolytes today    --BMP with AM labs  2. Constipation: Patient with bowel movement this morning. Per nephrology avoid Fleet enemas. Constipating medications include fentanyl infusion. [name] also diabetic.     --Continue senna/docusate to 2 tabs TID    --Discontinue Miralax BID  3. Glucose: History of persistent hyperglycemia this admission. [name] now off of insulin drip and transitioned to Alice insulin. Diet includes continuous tube feeds as tolerated. Corticosteroids discontinued 9/11. Patient continues to be hyperglycemic.    --Continue insulin glargine 40 units daily    --Increase to 4 units insulin aspart q4h while on tube feeds    --Continue SSI q4h.   Pharmacy will continue to monitor and adjust per consult.   [place] Resident 12/07/2018 1:59 PM

## 2018-12-05 NOTE — Anesthesia Preprocedure Evaluation (Addendum)
Anesthesia Evaluation  Patient identified by MRN, date of birth, ID band Patient awake    Reviewed: Allergy & Precautions, H&P , NPO status , [name] Chart, lab work & pertinent test results  Airway Mallampati: Intubated       Dental   Pulmonary sleep apnea , COPD,  Multifocal pneumonia, vent dependence          Cardiovascular hypertension, +CHF  (-) Past MI and (-) Cardiac Stents + dysrhythmias Atrial Fibrillation      Neuro/Psych negative neurological ROS  negative psych ROS   GI/Hepatic negative GI ROS, Neg liver ROS,   Endo/Other  diabetesMorbid obesity (super morbid obesity BMI 72)  Renal/GU      Musculoskeletal   Abdominal   Peds  Hematology Anemia, thrombocytopenia   Anesthesia Other Findings Past Medical History: No date: Atrial fibrillation (HCC) No date: CHF (congestive heart failure) (HCC) No date: Diabetes mellitus without complication (HCC) No date: Gout No date: Hypertension associated with type 2 diabetes mellitus ([place]) No date: Morbid obesity ([place]) No date: Obstructive sleep apnea  Past Surgical History: No date: MITRAL VALVE REPAIR  BMI    Body Mass Index: 71.59 kg/m      Reproductive/Obstetrics negative OB ROS                           Anesthesia Physical Anesthesia Plan  ASA: IV  Anesthesia Plan: General ETT   Post-op Pain Management:    Induction:   PONV Risk Score and Plan: Midazolam and Treatment may vary due to age or medical condition  Airway Management Planned: Tracheostomy  Additional Equipment:   Intra-op Plan:   Post-operative Plan:   Informed Consent: I have reviewed the patients History and Physical, chart, labs and discussed the procedure including the risks, benefits and alternatives for the proposed anesthesia with the patient or authorized representative who has indicated his/her understanding and acceptance.     Dental Advisory  Given  Plan Discussed with: Anesthesiologist and CRNA  Anesthesia Plan Comments: (Receiving platelets prior to OR)        Anesthesia Quick Evaluation

## 2018-12-06 LAB — BPAM PLATELET PHERESIS
Blood Product Expiration Date: 202009182359
ISSUE DATE / TIME: 202009161015
Unit Type and Rh: 5100

## 2018-12-06 LAB — CBC
HCT: 29.9 % — ABNORMAL LOW (ref 39.0–52.0)
Hemoglobin: 9.3 g/dL — ABNORMAL LOW (ref 13.0–17.0)
MCH: 29.8 pg (ref 26.0–34.0)
MCHC: 31.1 g/dL (ref 30.0–36.0)
MCV: 95.8 fL (ref 80.0–100.0)
Platelets: 54 10*3/uL — ABNORMAL LOW (ref 150–400)
RBC: 3.12 MIL/uL — ABNORMAL LOW (ref 4.22–5.81)
RDW: 14.4 % (ref 11.5–15.5)
WBC: 3.9 10*3/uL — ABNORMAL LOW (ref 4.0–10.5)
nRBC: 1.3 % — ABNORMAL HIGH (ref 0.0–0.2)

## 2018-12-06 LAB — BASIC METABOLIC PANEL
Anion gap: 12 (ref 5–15)
BUN: 123 mg/dL — ABNORMAL HIGH (ref 6–20)
CO2: 23 mmol/L (ref 22–32)
Calcium: 8.1 mg/dL — ABNORMAL LOW (ref 8.9–10.3)
Chloride: 115 mmol/L — ABNORMAL HIGH (ref 98–111)
Creatinine, Ser: 2.92 mg/dL — ABNORMAL HIGH (ref 0.61–1.24)
GFR calc Af Amer: 26 mL/min — ABNORMAL LOW (ref >60)
GFR calc non Af Amer: 22 mL/min — ABNORMAL LOW (ref >60)
Glucose, Bld: 174 mg/dL — ABNORMAL HIGH (ref 70–99)
Potassium: 3.6 mmol/L (ref 3.5–5.1)
Sodium: 150 mmol/L — ABNORMAL HIGH (ref 135–145)

## 2018-12-06 LAB — GLUCOSE, CAPILLARY
Glucose-Capillary: 146 mg/dL — ABNORMAL HIGH (ref 70–99)
Glucose-Capillary: 166 mg/dL — ABNORMAL HIGH (ref 70–99)
Glucose-Capillary: 181 mg/dL — ABNORMAL HIGH (ref 70–99)
Glucose-Capillary: 185 mg/dL — ABNORMAL HIGH (ref 70–99)
Glucose-Capillary: 179 mg/dL — ABNORMAL HIGH (ref 70–99)
Glucose-Capillary: 191 mg/dL — ABNORMAL HIGH (ref 70–99)
Glucose-Capillary: 182 mg/dL — ABNORMAL HIGH (ref 70–99)

## 2018-12-06 LAB — PREPARE PLATELET PHERESIS: Unit division: 0

## 2018-12-06 MED ORDER — ANTICOAGULANT SODIUM CITRATE 4% (200MG/5ML) IV SOLN
5.0000 mL | Status: DC | PRN
  Administered 2018-12-06: 01:00:00 5 mL via INTRAVENOUS
  Filled 2018-12-06 (×2): qty 5

## 2018-12-06 MED ORDER — VANCOMYCIN HCL 1.25 G IV SOLR
1250.0000 mg | Freq: Once | INTRAVENOUS | Status: DC
  Filled 2018-12-06: qty 1250

## 2018-12-06 MED ORDER — SENNOSIDES-DOCUSATE SODIUM 8.6-50 MG PO TABS
2.0000 | ORAL_TABLET | Freq: Two times a day (BID) | ORAL | Status: DC
  Administered 2018-12-06 – 2018-12-07 (×2): 2
  Filled 2018-12-06 (×2): qty 2

## 2018-12-06 MED ORDER — FREE WATER
400.0000 mL | Status: DC
  Administered 2018-12-06 – 2018-12-07 (×10): 400 mL

## 2018-12-06 NOTE — Progress Notes (Signed)
TREATMENT INITIATED,CATHETER IN RT FEMORAL USED, GOAL TO REMOVE 1L 3K BATH FOR 2.5 HOUR TREATMENT, VSS   12/06/18 1239  Vital Signs  Pulse Rate (!) 107  Pulse Rate Source Monitor  Resp 18 (Simultaneous filing. User may not have seen previous data.)  BP 110/76 (Simultaneous filing. User may not have seen previous data.)  BP Location Right Arm  BP Method Automatic  Patient Position (if appropriate) Lying  Oxygen Therapy  End Tidal CO2 (EtCO2) 29 (Simultaneous filing. User may not have seen previous data.)  During Hemodialysis Assessment  Blood Flow Rate (mL/min) 300 mL/min  Arterial Pressure (mmHg) -180 mmHg  Venous Pressure (mmHg) 120 mmHg  Transmembrane Pressure (mmHg) 60 mmHg  Ultrafiltration Rate (mL/min) 510 mL/min  Dialysate Flow Rate (mL/min) 600 ml/min  Conductivity: Machine  13.5  HD Safety Checks Performed Yes  Dialysis Fluid Bolus Normal Saline  Bolus Amount (mL) 250 mL  Intra-Hemodialysis Comments Tx initiated

## 2018-12-06 NOTE — Progress Notes (Signed)
[facility] [name] NAME: Kyle White    MR#:  [id number]  DATE OF BIRTH:  09-17-1960  SUBJECTIVE:  Pt is afebrile,remains intubated on the ventilator, severely hypoxemic with underlying pickwickian syndrome,unsuccessful weaning trials.  Critically ill, uremic, started hemodialysis--issues with catheter clotting  patient became bradycardia with dialysis today. Not able to complete the treatment underwent bronchoscopy 11/22/18.  Palliative care team is involved   patient now on IV pressers, amiodarone, fentanyl, precedex  S/p trach 12/01/2018 S/p right femoral catheter REVIEW OF SYSTEMS:   Review of Systems  Unable to perform ROS: Intubated  Psychiatric/Behavioral: Nervous/anxious:      DRUG ALLERGIES:   No Active Allergies  VITALS:  Blood pressure 103/78, pulse (!) 111, temperature 98.8 F (37.1 C), temperature source Oral, resp. rate (!) 23, height 5' 9.02" (1.753 m), weight (!) 224 kg, SpO2 (!) 89 %.  PHYSICAL EXAMINATION:   Physical Exam  GENERAL:  58 y.o.-year-old patient lying in the bed with no acute distress. Morbidly obese Morbidly obese Critically ill intubated on the vent EYES: Pupils equal, round, reactive to light and accommodation. No scleral icterus. Extraocular muscles intact.  HEENT: Head atraumatic, normocephalic. Oropharynx and nasopharynx clear. TRACH+ NECK:  Supple, no jugular venous distention. No thyroid enlargement, no tenderness.  LUNGS: Mod breath sounds bilaterally, positive wheezing, rales, rhonchi. CARDIOVASCULAR: S1, S2 normal. No murmurs, rubs, or gallops.  ABDOMEN: Soft, nontender, nondistended. Bowel sounds present. No organomegaly or mass.  EXTREMITIES: edema+NEUROLOGIC: sedated GCS less than 8  right groin HD cath PSYCHIATRIC: sedated  and on the vent SKIN: per RN documetnation  LABORATORY PANEL:  CBC Recent Labs  Lab 12/06/18 0503  WBC 3.9*  HGB 9.3*  HCT 29.9*  PLT 54*     Chemistries  Recent Labs  Lab 12/03/18 0435 12/04/18 0438  12/06/18 0503  NA 147* 149*   < > 150*  K 3.5 3.1*   < > 3.6  CL 110 111   < > 115*  CO2 22 22   < > 23  GLUCOSE 292* 181*   < > 174*  BUN 148* 144*   < > 123*  CREATININE 3.53* 3.38*   < > 2.92*  CALCIUM 8.1* 8.0*   < > 8.1*  MG  --  2.9*  --   --   AST 35  --   --   --   ALT 223*  --   --   --   ALKPHOS 114  --   --   --   BILITOT 0.8  --   --   --    < > = values in this interval not displayed.   Cardiac Enzymes No results for input(s): TROPONINI in the last 168 hours. RADIOLOGY:  No results found. ASSESSMENT AND PLAN:  Kyle White  is a 58 y.o. male with a known history of atrial fibrillation on Coumadin, CHF, diabetes mellitus, and obstructive sleep apnea with CPAP use at home, morbid obesity with sedentary lifestyle on oxygen at 2 L/min at home.  1.Acute on chronic  respiratory failure with severe hypoxia- underlying pickwickian syndrome -No clinical improvement, unsuccessful weaning trials--s/p tracheostomy 12/11/2018 -  -patient underwent bronchoscopy 11/22/2018. 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 Acute renal failure on chronic kidney disease stage III--NOW on HD  with baseline creatinine 1.28-1.89-1.87-2.25-2.21-3.05--3.17-3.11 -Repeat BMP in the a.m. and continue to monitor renal function closely -Patient being uremic nephrology started the patient on hemodialysis -issues with [facility] cath--has right  groin temp cath +  #  Type IIDiabetes mellitus -Sliding scale insulin + lantus -Hemoglobin A1c 7.3    Overall poor prognosis. Appreciate palliative care input.   CODE STATUS: DNR  DVT Prophylaxis: scd as patient is thrombocytopenic  TOTAL TIME TAKING CARE OF THIS PATIENT:25 minutes.  >50% time spent on counselling and coordination of care  POSSIBLE D/C IN *?* DAYS, DEPENDING ON CLINICAL CONDITION.  Note: This dictation was prepared with Dragon dictation along with smaller phrase technology. Any transcriptional errors that result from this process are unintentional.   Fritzi Mandes M.D on 12/06/2018 at 3:25 PM  Between 7am to 6pm - Pager - [phone]  After 6pm go to [email or web address] - password [email or web address] [place] [facility]  Office  [phone]  CC: Primary care physician; Inc, [place] ServicesPatient ID: Kyle White, male   DOB: October 10, 1960, 58 y.o.   MRN: [id number]

## 2018-12-06 NOTE — Progress Notes (Signed)
TREATMENT COMPLETED, HEART RATE STARTED DECLINING, B/P DECLINING, CATHETER NOT FUNCTIONING, WAS ABLE TO RINSE PATIENT BACK, VITALS THEN START STABILIZING, PRIMARY NURSE IN ROOM   12/06/18 1306  Vital Signs  Temp 98.8 F (37.1 C)  Temp Source Oral  Pulse Rate (!) 103  Resp (!) 23  Oxygen Therapy  SpO2 (!) 89 %  End Tidal CO2 (EtCO2) 29  During Hemodialysis Assessment  Blood Flow Rate (mL/min) 150 mL/min  Arterial Pressure (mmHg) -10 mmHg  Venous Pressure (mmHg) 120 mmHg  Transmembrane Pressure (mmHg) 60 mmHg  Ultrafiltration Rate (mL/min) 0 mL/min  Dialysate Flow Rate (mL/min) 600 ml/min  Conductivity: Machine  13.5  HD Safety Checks Performed Yes  Dialysis Fluid Bolus Normal Saline  Bolus Amount (mL) 250 mL  Intra-Hemodialysis Comments Tx completed  Post-Hemodialysis Assessment  Rinseback Volume (mL) 250 mL  Dialyzer Clearance Lightly streaked  Hemodialysis Intake (mL) 500 mL  UF Total -Machine (mL) 218 mL  Net UF (mL) -282 mL  Tolerated HD Treatment No (Comment)  Post-Hemodialysis Comments CATHETER NOT FUNCTIONAL FOR DIALYSIS  Hemodialysis Catheter Right Femoral vein Triple lumen Temporary (Non-Tunneled)  Placement Date/Time: 12/04/2018 0800   Placed prior to admission: No  Time Out: Correct patient;Correct site;Correct procedure  Maximum sterile barrier precautions: Hand hygiene;Cap;Mask;Sterile gown;Sterile gloves;Large sterile sheet  Site Prep: Chlorh...  Site Condition No complications  Blue Lumen Status Flushed (SODIUM CITRTATE)  Red Lumen Status Flushed (SODIUM CITRATE)  Catheter fill solution 4% Sodium Citrate  Catheter fill volume (Arterial) 1.8 cc  Catheter fill volume (Venous) 1.8  Dressing Type Biopatch;Occlusive  Drainage Description None  Post treatment catheter status Capped and Clamped

## 2018-12-06 NOTE — Progress Notes (Signed)
CATHETER NOT FUNCTIONING, POSITIONING PATIENT VERY CHALLENGING, WILL CONTINUE TO MONITOR   12/06/18 1245  Vital Signs  Resp 17  BP (!) 87/71  BP Location Right Arm  BP Method Automatic  Patient Position (if appropriate) Lying  Oxygen Therapy  End Tidal CO2 (EtCO2) 30  During Hemodialysis Assessment  Blood Flow Rate (mL/min) 250 mL/min  Arterial Pressure (mmHg) -250 mmHg  Venous Pressure (mmHg) 120 mmHg  Transmembrane Pressure (mmHg) 60 mmHg  Ultrafiltration Rate (mL/min) 510 mL/min  Dialysate Flow Rate (mL/min) 600 ml/min  Conductivity: Machine  13.5  HD Safety Checks Performed Yes  Intra-Hemodialysis Comments See progress note

## 2018-12-06 NOTE — Progress Notes (Signed)
Pharmacy Electrolyte Monitoring Consult:  Pharmacy consulted to assist in monitoring and replacing electrolytes in this 58 y.o. male admitted on 11/14/2018. [name] currently intubated and sedated on dexmedetomidine and fentanyl infusion.   Labs:  Sodium (mmol/L)  Date Value  12/06/2018 150 (H)   Potassium (mmol/L)  Date Value  12/06/2018 3.6   Magnesium (mg/dL)  Date Value  12/04/2018 2.9 (H)   Phosphorus (mg/dL)  Date Value  12/04/2018 6.2 (H)   Calcium (mg/dL)  Date Value  12/06/2018 8.1 (L)   Albumin (g/dL)  Date Value  12/03/2018 1.8 (L)   Corrected Calcium: 9.7  Assessment/Plan: 1. Electrolytes: Sodium improved today but continues to be mildly elevated. Sodium trending up today. Patient will go for dialysis today.     --Free water flushes increased to 400 mL q4h    --Defer repletion of electrolytes today    --BMP with AM labs  2. Constipation: Patient with bowel movement last two days. Per nephrology avoid [place] enemas. Constipating medications include fentanyl infusion. [name] also diabetic.     --Reduce senna/docusate to 2 tabs BID    --Discontinue Miralax BID  3. Glucose: History of persistent hyperglycemia this admission. [name] now off of insulin drip and transitioned to [place] insulin. Diet includes continuous tube feeds as tolerated. Corticosteroids discontinued 9/11. Glucoses improved today after increasing insulin aspart.    --Continue insulin glargine 40 units daily    --Continue 4 units insulin aspart q4h while on tube feeds    --Continue SSI q4h.   Pharmacy will continue to monitor and adjust per consult.   [place] Resident 12/06/2018 12:00 PM

## 2018-12-06 NOTE — Anesthesia Postprocedure Evaluation (Signed)
Anesthesia Post Note  Patient: Kyle White  Procedure(s) Performed: TRACHEOSTOMY (N/A Neck)  Patient location during evaluation: SICU Anesthesia Type: General Level of consciousness: sedated Pain management: pain level controlled Vital Signs Assessment: post-procedure vital signs reviewed and stable Respiratory status: patient on ventilator - see flowsheet for VS and respiratory function stable Cardiovascular status: stable Postop Assessment: no apparent nausea or vomiting Anesthetic complications: no     Last Vitals:  Vitals:   12/06/18 0600 12/06/18 0630  BP: (!) 116/97 91/75  Pulse: 68 75  Resp: (!) 22 20  Temp:    SpO2: 100% 94%    Last Pain:  Vitals:   12/06/18 0400  TempSrc: Oral  PainSc:                  Caryl Asp

## 2018-12-06 NOTE — Consult Note (Signed)
Pharmacy Antibiotic Note  Kyle White is a 58 y.o. male admitted on 11/15/2018 with pneumonia. 9/3 BAL significant for Staph simulans and Strep mitis/oralis. Patient had received approximately 4 days of vancomycin therapy this admission (last dose 9/9). Meropenem was started on 9/9 for persistent fevers, concern for pseudomonas, and head CT significant for sinusitis. Meropenem and vancomycin were d/c 9/10 and ceftriaxone monotherapy was started. Ceftriaxone was then changed to Zosyn on 9/13 after concern for possible aspiration pneumonia. Per discussion on rounds plan is to discontinue Zosyn and re-start vancomycin as duration of therapy was determined to be inadequate. Pharmacy consulted for vancomycin dosing for Staph simulans and Strep mitis/oralis pneumonia. 9/14 CXR significant for stable bibasilar opacities with small pleural effusions. Patient also with acute renal failure secondary to acute cardiorenal syndrome receiving intermittent HD for uremia and fluid overload.    Patient remains intubated and POD #1 from tracheostomy . Patient with pancytopenia. Afebrile last 24h. HD session 9/14 terminated after ~30 minutes due to catheter dysfunction. Patient with new right femoral catheter. Vancomycin initially ordered to be given with dialysis today however patient did not tolerate dialysis and antibiotic was not given. Today is day 4 of therapy with planned duration for two more days.   Plan:   Will check vancomycin level with morning labs tomorrow. Continue to follow dialysis plan daily.   Height: 5' 9.02" (175.3 cm) Weight: (!) 493 lb 13.3 oz (224 kg) IBW/kg (Calculated) : 70.74  Temp (24hrs), Avg:99.4 F (37.4 C), Min:98 F (36.7 C), Max:100.3 F (37.9 C)  Recent Labs  Lab 11/30/18 0632  12/02/18 0456 12/03/18 0435 12/04/18 0438 12/04/18 1400 12/13/2018 0413 12/06/18 0503  WBC 8.9   < > 13.2* 8.4 5.1  --  4.3 3.9*  CREATININE 3.11*   < > 3.27* 3.53* 3.38*  --  3.29* 2.92*   VANCORANDOM 22  --   --   --   --  19  --   --    < > = values in this interval not displayed.    Estimated Creatinine Clearance: 50.9 mL/min (A) (by C-G formula based on SCr of 2.92 mg/dL (H)).    No Active Allergies  Antimicrobials this admission: Cefepime 9/3 >> 9/8 Vancomycin 9/7 >> 9/10, 9/14 >> Meropenem 9/9 >> 9/10 Ceftriaxone 9/11 >> 9/12 Zosyn 9/13 >> 9/14  Dose adjustments this admission:  9/9: Vancomycin decreased from 1750 mg Q24H to 1250 mg Q24H due to serum creatinine bump  9/10: Meropenem decreased from 1g Q12H to 500 mg Q24H due to patient starting intermittent HD. Vancomycin transitioned to Dialysis dosing.   Microbiology results: 9/13 tracheal aspirate: few candida albicans 9/9 respiratory culture: rare GPC in pairs 9/9 respiratory panel: none detected 9/7 Bcx: no growth  9/3 BAL: Staph simulans & Strep mitis/oralis, vancomycin susceptible 8/29 Bcx: No growth 8/28 Ucx: No growth 8/28 MRSA PCR: negative  Thank you for allowing pharmacy to be a part of this patient's care.  [place] Resident 12/06/2018 12:13 PM

## 2018-12-06 NOTE — Progress Notes (Signed)
Rough day. Unable to tolerate hemo dialysis. B/p, heart rate and oxygen levels up and down. Therefore medications up and down.Family becoming unreasonable with expectations of care and survival. Trach secretions and sputum blood tinged. Urinary output good.

## 2018-12-06 NOTE — Progress Notes (Addendum)
Patient did not tolerate dialysis. Blood pressure, heart rate and oxygen saturation all dropped when dialysis started. Dialysis discontinued, ventilator oxygen increased and NeoSynephrene infusion increased to sustain patient.

## 2018-12-06 NOTE — Progress Notes (Signed)
CRITICAL CARE NOTE 58 y.o.M with hx of type 2 diabetes, chronic AF, pickwickian syndrome, hypertension, congestive heart failure adm 8/28 via ED to ICU/SDU with acute on chronic respiratory failure felt to be due to CHF/pulmonary edema  CC  follow up respiratory failure  SUBJECTIVE Patient remains critically ill Prognosis is guarded S/p trach  Vent Mode: PCV FiO2 (%):  [50 %-80 %] 60 % Set Rate:  [20 bmp] 20 bmp PEEP:  [8 cmH20] 8 cmH20 Plateau Pressure:  [26 cmH20-27 cmH20] 27 cmH20    BP 91/75   Pulse 75   Temp 100 F (37.8 C) (Oral)   Resp 20   Ht 5' 9.02" (1.753 m)   Wt (!) 224 kg   SpO2 94%   BMI 72.89 kg/m    I/O last 3 completed shifts: In: 6918.2 [I.V.:3323.6; NG/GT:3339.5; IV Piggyback:255.1] Out: 3760 [Urine:3750; Blood:10] No intake/output data recorded.  SpO2: 94 % O2 Flow Rate (L/min): 3 L/min FiO2 (%): 60 %    SIGNIFICANT EVENTS MAJOR EVENTS/TEST RESULTS: 08/28 admitted to ICU/SDU for BiPAP. PCCM consultation 08/28 emergently intubated 08/29 Echocardiogram: EF 55-60%. LV cavity mildly dilated. Diastolic function could not be evaluated. Left atrium mildly dilated. RV not assessed. RA size not well visualized. 08/29 CTAP: No acute intra-abdominal process noted. No perforation. Trace ascites. Severe hepatic steatosis. Cholelithiasis. Small R and trace L pleural effusions. Bilateral lower lobe airspace disease may represent atelectasis but pneumonia is difficult to exclude, particularly on the R. 08/30 Extubated 08/30 Cardiology consultation: recommended diltiazem gtt for rapid AF and continue anticoagulation 08/31 reintubated emergently 08/31 bronchoscopy for mucous plugging and complete collapse of R lung 08/31 Palliative Care consultation to address goals of care 09/03 DNR established 09/05 Nephrology consultation for AKI 09/06 Renal TD:SKAJGO appearance of the kidneys 09/06 requiring 100% FiO2 and PEEP 15 cm H2O 09/07 ETT cuff leak,  ETT changed 09/08 CTH: No acute intracranial abnormality seen 09/10 HD cath placed and intermittent HD initiated 09/11 + F/C. Improving vent requirements. 2 hrs of HD performed 09/12 improving vent requirements. Low-dose clonazepam and Duragesic patch initiated. Goals of care discussion with patient's sister and recommended tracheostomy tube. 09/13 Severe desaturation with increased purulent respiratory secretions and volume loss due to mucus plugging.Respiratory culture obtained. Antibiotics expanded to piperacillin-tazobactam. DNR confirmed with patient's sister. Continuous sedation infusions initiated 9/14 ETT dislodged, s/p bronch to adjust ETT, extubated and re-intubated Patient has tracheomegaly, plan for trach 9/15 remains on high fio2 9/16 s/p trach  INDWELLING DEVICES:: ETT 08/28 >>08/30, 08/31 >>09/07 (cuff leak), 09/07 >> R IJ CVL 08/31 >> L IJ HD cath 09/10 >>  MICRO DATA: SARS CoV 2 PCR 08/28 >> NEG MRSA PCR 08/28 >> NEG Urine 08/28 >> NEG Resp 08/28 >>abundant GPC chains Blood 08/29 >> NEG BAL 09/03 >> Staph simulans, strep mitis Blood 09/07 >> NEG RVP 09/09 >> NEG Resp 09/09 >> c/w NOF Resp 09/13 >>  ANTIMICROBIALS:  Cefepime 09/03 >>09/08  Vancomycin 09/07 >> 09/10 Meropenem 09/09 >> 09/11 Ceftriaxone 09/11 >>09/13 Pip-tazo 09/13 >>  REVIEW OF SYSTEMS  [name] UNABLE TO PROVIDE COMPLETE REVIEW OF SYSTEMS DUE TO SEVERE CRITICAL ILLNESS   PHYSICAL EXAMINATION:  GENERAL:critically ill appearing, +resp distress HEAD: Normocephalic, atraumatic.  EYES: Pupils equal, round, reactive to light.  No scleral icterus.  MOUTH: Moist mucosal membrane. NECK: Supple. S/p trah PULMONARY: +rhonchi, +wheezing CARDIOVASCULAR: S1 and S2. Regular rate and rhythm. No murmurs, rubs, or gallops.  GASTROINTESTINAL: Soft, nontender, -distended. No masses. Positive bowel sounds. No hepatosplenomegaly.  MUSCULOSKELETAL: No swelling, clubbing, or edema.   NEUROLOGIC: obtunded, GCS<8 SKIN:intact,warm,dry  MEDICATIONS: I have reviewed all medications and confirmed regimen as documented   CULTURE RESULTS   Recent Results (from the past 240 hour(s))  CULTURE, BLOOD (ROUTINE X 2) w Reflex to ID Panel     Status: None   Collection Time: 11/26/18 10:32 PM   Specimen: BLOOD  Result Value Ref Range Status   Specimen Description BLOOD RIGHT ANTECUBITAL  Final   Special Requests   Final    BOTTLES DRAWN AEROBIC AND ANAEROBIC Blood Culture adequate volume   Culture   Final    NO GROWTH 5 DAYS Performed at [facility], [address] [place], [place] [address]    Report Status 12/01/2018 FINAL  Final  CULTURE, BLOOD (ROUTINE X 2) w Reflex to ID Panel     Status: None   Collection Time: 11/26/18 10:32 PM   Specimen: BLOOD  Result Value Ref Range Status   Specimen Description BLOOD BLOOD RIGHT HAND  Final   Special Requests   Final    BOTTLES DRAWN AEROBIC AND ANAEROBIC Blood Culture adequate volume   Culture   Final    NO GROWTH 5 DAYS Performed at [facility], [address] [place], [place] [address]    Report Status 12/01/2018 FINAL  Final  Respiratory Panel by PCR     Status: None   Collection Time: 11/28/18 10:36 AM   Specimen: Nasopharyngeal Swab; Respiratory  Result Value Ref Range Status   Adenovirus NOT DETECTED NOT DETECTED Final   Coronavirus 229E NOT DETECTED NOT DETECTED Final    Comment: (NOTE) The Coronavirus on the Respiratory Panel, DOES NOT test for the novel  Coronavirus (2019 nCoV)    Coronavirus HKU1 NOT DETECTED NOT DETECTED Final   Coronavirus NL63 NOT DETECTED NOT DETECTED Final   Coronavirus OC43 NOT DETECTED NOT DETECTED Final   Metapneumovirus NOT DETECTED NOT DETECTED Final   Rhinovirus / Enterovirus NOT DETECTED NOT DETECTED Final   Influenza A NOT DETECTED NOT DETECTED Final   Influenza B NOT DETECTED NOT DETECTED Final   Parainfluenza Virus 1 NOT DETECTED NOT DETECTED Final    Parainfluenza Virus 2 NOT DETECTED NOT DETECTED Final   Parainfluenza Virus 3 NOT DETECTED NOT DETECTED Final   Parainfluenza Virus 4 NOT DETECTED NOT DETECTED Final   Respiratory Syncytial Virus NOT DETECTED NOT DETECTED Final   Bordetella pertussis NOT DETECTED NOT DETECTED Final   Chlamydophila pneumoniae NOT DETECTED NOT DETECTED Final   Mycoplasma pneumoniae NOT DETECTED NOT DETECTED Final    Comment: Performed at [facility], [place] [address] [place], [place] [address]  Culture, respiratory (non-expectorated)     Status: None   Collection Time: 11/28/18 11:29 AM   Specimen: Tracheal Aspirate; Respiratory  Result Value Ref Range Status   Specimen Description   Final    TRACHEAL ASPIRATE Performed at [facility], [address] [place], [place] [address]    Special Requests   Final    NONE Performed at [facility], [address] [place], [place] [address]    Gram Stain   Final    FEW WBC PRESENT, PREDOMINANTLY PMN FEW SQUAMOUS EPITHELIAL CELLS PRESENT RARE GRAM POSITIVE COCCI IN PAIRS    Culture   Final    FEW Consistent with normal respiratory flora. Performed at [facility], [place] [address], [place], [place] [address]    Report Status 11/30/2018 FINAL  Final  Culture, respiratory (non-expectorated)     Status: None   Collection Time: 12/02/18  8:45 AM   Specimen: Tracheal Aspirate; Respiratory  Result Value Ref Range Status   Specimen Description   Final    TRACHEAL ASPIRATE Performed at [facility], [address], [place], [place] [address]    Special Requests   Final    NONE Performed at [facility], [address] [place], [place] [address]    Gram Stain   Final    NO WBC SEEN NO ORGANISMS SEEN Performed at [facility], [place] [address], [place], [place] [address]    Culture FEW CANDIDA ALBICANS  Final   Report Status 12/04/2018 FINAL  Final          Indwelling Urinary Catheter  continued, requirement due to   Reason to continue Indwelling Urinary Catheter strict Intake/Output monitoring for hemodynamic instability   Central Line/ continued, requirement due to  Reason to continue [place] of central venous pressure or other hemodynamic parameters and poor IV access   Ventilator continued, requirement due to severe respiratory failure   Ventilator Sedation RASS 0 to -2      ASSESSMENT AND PLAN SYNOPSIS 58 y.o.M with hx of type 2 diabetes, chronic AF, pickwickian syndrome, hypertension, congestive heart failure adm 8/28 via ED to ICU/SDU with acute on chronic respiratory failure felt to be due to CHF/pulmonary edema failed to wean from vent and s/p trach complicated by progressive renal failure and resp failure and failure to wean from vent failed extubation x 1  Severe ACUTE Hypoxic and Hypercapnic Respiratory Failure -continue Full MV support -continue Bronchodilator Therapy -Wean Fio2 and PEEP as tolerated  ACUTE DIASTOLIC CARDIAC FAILURE- EF -oxygen as needed   ACUTE KIDNEY INJURY/Renal Failure -follow chem 7 -follow UO -continue Foley Catheter-assess need -Avoid nephrotoxic agents HD today  NEUROLOGY - intubated and sedated - minimal sedation to achieve a RASS goal: -1   CARDIAC ICU monitoring  ID -continue IV abx as prescibed -follow up cultures  GI GI PROPHYLAXIS as indicated  NUTRITIONAL STATUS DIET-->TF's as tolerated Constipation protocol as indicated  ENDO - will use ICU hypoglycemic\Hyperglycemia protocol if indicated   ELECTROLYTES -follow labs as needed -replace as needed -pharmacy consultation and following   DVT/GI PRX ordered TRANSFUSIONS AS NEEDED MONITOR FSBS ASSESS the need for LABS as needed   Critical Care Time devoted to patient care services described in this note is 32 minutes.   Overall, patient is critically ill, prognosis is guarded.  Patient with Multiorgan failure and at high  risk for cardiac arrest and death.    Corrin Parker, M.D.  [facility]  Medical Director [place] Director [facility] Cardio-Pulmonary Department

## 2018-12-06 NOTE — Progress Notes (Signed)
[facility], [place] 12/06/18  Subjective:   LOS: 20 09/16 0701 - 09/17 0700 In: 4820.2 [I.V.:2410.6; FP/OI:5189.8; IV Piggyback:255.1] Out: 2485 [Urine:2475; Blood:10] Critically ill Vent assisted. Fio2 50 %  Sedation - fentanyl Continues to have large amount of edema New rt femoral cathter placed   UOP has improved S Cr trending lower, BUN lower but critically elevated   Objective:  Vital signs in last 24 hours:  Temp:  [98 F (36.7 C)-100.3 F (37.9 C)] 100 F (37.8 C) (09/17 0400) Pulse Rate:  [38-130] 75 (09/17 0630) Resp:  [17-22] 20 (09/17 0630) BP: (89-133)/(68-105) 91/75 (09/17 0630) SpO2:  [92 %-100 %] 100 % (09/17 0758) FiO2 (%):  [50 %-80 %] 50 % (09/17 0758) Weight:  [224 kg] 224 kg (09/17 0500)  Weight change: 4 kg Filed Weights   12/04/18 0216 12/03/2018 0500 12/06/18 0500  Weight: (!) 220.9 kg (!) 220 kg (!) 224 kg    Intake/Output:    Intake/Output Summary (Last 24 hours) at 12/06/2018 0918 Last data filed at 12/06/2018 0600 Gross per 24 hour  Intake 4820.2 ml  Output 2485 ml  Net 2335.2 ml     Physical Exam: General: critically ill apearing  HEENT Tracheostomy;  placed 12/15/2018  Pulm/lungs Vent assisted  CVS/Heart Irregular, tachycardic  Abdomen:  Obese, dependent edema  Extremities: SCDs, + dependent edema  Neurologic: sedated  Skin: warm  Access: Rt femoral dialysis cathter in place   Rectal tube, Foley in place    Basic Metabolic Panel:  Recent Labs  Lab 11/30/18 0632  12/02/18 0456 12/03/18 0435 12/04/18 0438 12/01/2018 0413 12/06/18 0503  NA 147*   < > 147* 147* 149* 147* 150*  K 4.2   < > 4.3 3.5 3.1* 3.5 3.6  CL 111   < > 111 110 111 111 115*  CO2 23   < > 26 22 22 24 23   GLUCOSE 241*   < > 252* 292* 181* 240* 174*  BUN 115*   < > 128* 148* 144* 141* 123*  CREATININE 3.11*   < > 3.27* 3.53* 3.38* 3.29* 2.92*  CALCIUM 7.7*   < > 8.3* 8.1* 8.0* 7.9* 8.1*  MG 2.8*  --   --   --  2.9*  --   --    PHOS 7.1*  --   --   --  6.2*  --   --    < > = values in this interval not displayed.     CBC: Recent Labs  Lab 11/30/18 0632  12/02/18 0456 12/03/18 0435 12/04/18 0438 11/26/2018 0413 12/07/2018 1630 12/06/18 0503  WBC 8.9   < > 13.2* 8.4 5.1 4.3  --  3.9*  NEUTROABS 8.3*  --   --  7.2  --   --   --   --   HGB 13.0   < > 12.7* 10.6* 9.9* 9.5*  --  9.3*  HCT 40.4   < > 41.6 32.3* 31.4* 30.6*  --  29.9*  MCV 93.7   < > 97.9 93.4 94.6 95.3  --  95.8  PLT 45*   < > 61* 57* 46* 47* 57* 54*   < > = values in this interval not displayed.      Lab Results  Component Value Date   HEPBSAG Negative 11/29/2018   HEPBSAB Non Reactive 11/29/2018   HEPBIGM Negative 11/29/2018      Microbiology:  Recent Results (from the past 240 hour(s))  CULTURE, BLOOD (  ROUTINE X 2) w Reflex to ID Panel     Status: None   Collection Time: 11/26/18 10:32 PM   Specimen: BLOOD  Result Value Ref Range Status   Specimen Description BLOOD RIGHT ANTECUBITAL  Final   Special Requests   Final    BOTTLES DRAWN AEROBIC AND ANAEROBIC Blood Culture adequate volume   Culture   Final    NO GROWTH 5 DAYS Performed at [facility], [address] [place], [place] [address]    Report Status 12/01/2018 FINAL  Final  CULTURE, BLOOD (ROUTINE X 2) w Reflex to ID Panel     Status: None   Collection Time: 11/26/18 10:32 PM   Specimen: BLOOD  Result Value Ref Range Status   Specimen Description BLOOD BLOOD RIGHT HAND  Final   Special Requests   Final    BOTTLES DRAWN AEROBIC AND ANAEROBIC Blood Culture adequate volume   Culture   Final    NO GROWTH 5 DAYS Performed at [facility], [address] [place], [place] [address]    Report Status 12/01/2018 FINAL  Final  Respiratory Panel by PCR     Status: None   Collection Time: 11/28/18 10:36 AM   Specimen: Nasopharyngeal Swab; Respiratory  Result Value Ref Range Status   Adenovirus NOT DETECTED NOT DETECTED Final   Coronavirus 229E NOT  DETECTED NOT DETECTED Final    Comment: (NOTE) The Coronavirus on the Respiratory Panel, DOES NOT test for the novel  Coronavirus (2019 nCoV)    Coronavirus HKU1 NOT DETECTED NOT DETECTED Final   Coronavirus NL63 NOT DETECTED NOT DETECTED Final   Coronavirus OC43 NOT DETECTED NOT DETECTED Final   Metapneumovirus NOT DETECTED NOT DETECTED Final   Rhinovirus / Enterovirus NOT DETECTED NOT DETECTED Final   Influenza A NOT DETECTED NOT DETECTED Final   Influenza B NOT DETECTED NOT DETECTED Final   Parainfluenza Virus 1 NOT DETECTED NOT DETECTED Final   Parainfluenza Virus 2 NOT DETECTED NOT DETECTED Final   Parainfluenza Virus 3 NOT DETECTED NOT DETECTED Final   Parainfluenza Virus 4 NOT DETECTED NOT DETECTED Final   Respiratory Syncytial Virus NOT DETECTED NOT DETECTED Final   Bordetella pertussis NOT DETECTED NOT DETECTED Final   Chlamydophila pneumoniae NOT DETECTED NOT DETECTED Final   Mycoplasma pneumoniae NOT DETECTED NOT DETECTED Final    Comment: Performed at [facility], [place] [address], [place], [place] [address]  Culture, respiratory (non-expectorated)     Status: None   Collection Time: 11/28/18 11:29 AM   Specimen: Tracheal Aspirate; Respiratory  Result Value Ref Range Status   Specimen Description   Final    TRACHEAL ASPIRATE Performed at [facility], [address], [place], [place] [address]    Special Requests   Final    NONE Performed at [facility], [address] [place], [place] [address]    Gram Stain   Final    FEW WBC PRESENT, PREDOMINANTLY PMN FEW SQUAMOUS EPITHELIAL CELLS PRESENT RARE GRAM POSITIVE COCCI IN PAIRS    Culture   Final    FEW Consistent with normal respiratory flora. Performed at [facility], [place] [address] [place], [place] [address]    Report Status 11/30/2018 FINAL  Final  Culture, respiratory (non-expectorated)     Status: None   Collection Time: 12/02/18  8:45 AM   Specimen: Tracheal Aspirate;  Respiratory  Result Value Ref Range Status   Specimen Description   Final  TRACHEAL ASPIRATE Performed at [facility], [address] [place], [place] [address]    Special Requests   Final    NONE Performed at [facility], [address] [place], [place] [address]    Gram Stain   Final    NO WBC SEEN NO ORGANISMS SEEN Performed at [facility], [place] [address] [place], [place] [address]    Culture FEW CANDIDA ALBICANS  Final   Report Status 12/04/2018 FINAL  Final    Coagulation Studies: Recent Labs    12/04/2018 0413  LABPROT 16.0*  INR 1.3*    Urinalysis: No results for input(s): COLORURINE, LABSPEC, PHURINE, GLUCOSEU, HGBUR, BILIRUBINUR, KETONESUR, PROTEINUR, UROBILINOGEN, NITRITE, LEUKOCYTESUR in the last 72 hours.  Invalid input(s): APPERANCEUR    Imaging: No results found.   Medications:   . sodium chloride Stopped (12/03/18 0527)  . amiodarone 60 mg/hr (12/06/18 0600)  . anticoagulant sodium citrate    . dexmedetomidine (PRECEDEX) IV infusion 0.6 mcg/kg/hr (12/06/18 0600)  . feeding supplement (VITAL HIGH PROTEIN) 45 mL/hr at 12/06/18 0600  . fentaNYL infusion INTRAVENOUS 150 mcg/hr (12/06/18 0600)  . phenylephrine (NEO-SYNEPHRINE) Adult infusion 26 mcg/min (12/06/18 0600)   . aspirin  81 mg Per NG tube Daily  . chlorhexidine gluconate (MEDLINE KIT)  15 mL Mouth Rinse BID  . Chlorhexidine Gluconate Cloth  6 each Topical Daily  . clonazePAM  1 mg Per Tube Daily  . clonazePAM  2 mg Per Tube QHS  . feeding supplement (PRO-STAT SUGAR FREE 64)  60 mL Per Tube TID  . free water  400 mL Per Tube Q4H  . insulin aspart  0-20 Units Subcutaneous Q4H  . insulin aspart  4 Units Subcutaneous Q4H  . insulin glargine  40 Units Subcutaneous Daily  . ipratropium-albuterol  3 mL Nebulization Q6H  . mouth rinse  15 mL Mouth Rinse 10 times per day  . multivitamin  15 mL Per Tube Daily  . pantoprazole sodium  40 mg Per Tube QHS  . QUEtiapine   25 mg Per Tube QHS  . QUEtiapine  50 mg Per Tube QHS  . senna-docusate  2 tablet Per Tube TID  . sodium chloride flush  10-40 mL Intracatheter Q12H  . vancomycin variable dose per unstable renal function (pharmacist dosing)   Does not apply See admin instructions   sodium chloride, acetaminophen, anticoagulant sodium citrate, bisacodyl, fentaNYL, metoprolol tartrate, midazolam, sodium chloride flush  Assessment/ Plan:  58 y.o. male with with atrial fibrillation on warfarin, congestive heart failure, diabetes mellitus type 2, obstructive sleep apnea,morbid obesity, gout, mitral valve replacementwho was admitted to [facility] on[facility]28[facility]2020for acute exacerbation of COPD  Active Problems:   Acute exacerbation of CHF (congestive heart failure) ([place])   Acute respiratory failure with hypoxia and hypercapnia (H[facility])   Goals of care, counseling/discussion   Palliative care by specialist   DNR (do not resuscitate) discussion   COPD exacerbation ([place])   #. ARF on CKD st3 Recent Labs    12/03/18 0435 12/04/18 0438 12/06/2018 0413 12/06/18 0503  CREATININE 3.53* 3.38* 3.29* 2.92*  S Creatinine remains elevated BUN is critically high UOP about 2500 cc Patient will benefit from HD to correct uremia  Plan for HD today - Avoid nephrotoxins such as NSAIDs, iv contrast, Fleets enema  # Hypokalemia, hypernatremia - replace prn - agree with free water replacement  #. Anemia    Lab Results  Component Value Date   HGB 9.3 (L) 12/06/2018   # Acute  resp failure - Vent dependent - Fio2 50%    # generalized Edema - 2 D echo from 8/29 shows LVEF 55-60% (nomral).  - Abnormal severe thickening of aortic wall Tricuspid valve and Rt vent were not assesed - Amiodarone for A Fib - not on diuretic, will attempt UF with HD   #. Diabetes type 2 with CKD Hgb A1c MFr Bld (%)  Date Value  11/17/2018 7.3 (H)        LOS: [place] 9/17/20209:18 [place], [place]

## 2018-12-07 ENCOUNTER — Inpatient Hospital Stay: Payer: Medicare Other

## 2018-12-07 DIAGNOSIS — J9620 Acute and chronic respiratory failure, unspecified whether with hypoxia or hypercapnia: Secondary | ICD-10-CM

## 2018-12-07 DIAGNOSIS — R579 Shock, unspecified: Secondary | ICD-10-CM

## 2018-12-07 LAB — CBC WITH DIFFERENTIAL/PLATELET
Abs Immature Granulocytes: 0.05 10*3/uL (ref 0.00–0.07)
Basophils Absolute: 0 10*3/uL (ref 0.0–0.1)
Basophils Relative: 0 %
Eosinophils Absolute: 0.1 10*3/uL (ref 0.0–0.5)
Eosinophils Relative: 2 %
HCT: 28.5 % — ABNORMAL LOW (ref 39.0–52.0)
Hemoglobin: 9 g/dL — ABNORMAL LOW (ref 13.0–17.0)
Immature Granulocytes: 1 %
Lymphocytes Relative: 13 %
Lymphs Abs: 0.5 10*3/uL — ABNORMAL LOW (ref 0.7–4.0)
MCH: 30 pg (ref 26.0–34.0)
MCHC: 31.6 g/dL (ref 30.0–36.0)
MCV: 95 fL (ref 80.0–100.0)
Monocytes Absolute: 0.2 10*3/uL (ref 0.1–1.0)
Monocytes Relative: 4 %
Neutro Abs: 3.3 10*3/uL (ref 1.7–7.7)
Neutrophils Relative %: 80 %
Platelets: 51 10*3/uL — ABNORMAL LOW (ref 150–400)
RBC: 3 MIL/uL — ABNORMAL LOW (ref 4.22–5.81)
RDW: 14.6 % (ref 11.5–15.5)
Smear Review: NORMAL
WBC: 4.1 10*3/uL (ref 4.0–10.5)
nRBC: 1.2 % — ABNORMAL HIGH (ref 0.0–0.2)

## 2018-12-07 LAB — BASIC METABOLIC PANEL
Anion gap: 11 (ref 5–15)
BUN: 118 mg/dL — ABNORMAL HIGH (ref 6–20)
CO2: 23 mmol/L (ref 22–32)
Calcium: 7.9 mg/dL — ABNORMAL LOW (ref 8.9–10.3)
Chloride: 114 mmol/L — ABNORMAL HIGH (ref 98–111)
Creatinine, Ser: 2.55 mg/dL — ABNORMAL HIGH (ref 0.61–1.24)
GFR calc Af Amer: 31 mL/min — ABNORMAL LOW (ref >60)
GFR calc non Af Amer: 26 mL/min — ABNORMAL LOW (ref >60)
Glucose, Bld: 182 mg/dL — ABNORMAL HIGH (ref 70–99)
Potassium: 3.5 mmol/L (ref 3.5–5.1)
Sodium: 148 mmol/L — ABNORMAL HIGH (ref 135–145)

## 2018-12-07 LAB — GLUCOSE, CAPILLARY
Glucose-Capillary: 145 mg/dL — ABNORMAL HIGH (ref 70–99)
Glucose-Capillary: 159 mg/dL — ABNORMAL HIGH (ref 70–99)
Glucose-Capillary: 179 mg/dL — ABNORMAL HIGH (ref 70–99)
Glucose-Capillary: 186 mg/dL — ABNORMAL HIGH (ref 70–99)
Glucose-Capillary: 196 mg/dL — ABNORMAL HIGH (ref 70–99)
Glucose-Capillary: 199 mg/dL — ABNORMAL HIGH (ref 70–99)

## 2018-12-07 LAB — URINALYSIS, COMPLETE (UACMP) WITH MICROSCOPIC
Bilirubin Urine: NEGATIVE
Glucose, UA: NEGATIVE mg/dL
Ketones, ur: NEGATIVE mg/dL
Leukocytes,Ua: NEGATIVE
Nitrite: NEGATIVE
Protein, ur: 30 mg/dL — AB
Specific Gravity, Urine: 1.015 (ref 1.005–1.030)
pH: 5 (ref 5.0–8.0)

## 2018-12-07 LAB — HEPATIC FUNCTION PANEL
ALT: 84 U/L — ABNORMAL HIGH (ref 0–44)
AST: 61 U/L — ABNORMAL HIGH (ref 15–41)
Albumin: 1.5 g/dL — ABNORMAL LOW (ref 3.5–5.0)
Alkaline Phosphatase: 382 U/L — ABNORMAL HIGH (ref 38–126)
Bilirubin, Direct: 0.2 mg/dL (ref 0.0–0.2)
Indirect Bilirubin: 0.7 mg/dL (ref 0.3–0.9)
Total Bilirubin: 0.9 mg/dL (ref 0.3–1.2)
Total Protein: 5 g/dL — ABNORMAL LOW (ref 6.5–8.1)

## 2018-12-07 LAB — PROCALCITONIN: Procalcitonin: 1.74 ng/mL

## 2018-12-07 LAB — VANCOMYCIN, RANDOM: Vancomycin Rm: 14

## 2018-12-07 MED ORDER — AMIODARONE LOAD VIA INFUSION
150.0000 mg | Freq: Once | INTRAVENOUS | Status: AC
  Administered 2018-12-07: 15:00:00 150 mg via INTRAVENOUS
  Filled 2018-12-07: qty 83.34

## 2018-12-07 MED ORDER — SODIUM CHLORIDE 0.9 % IV SOLN
1.0000 g | Freq: Three times a day (TID) | INTRAVENOUS | Status: DC
  Administered 2018-12-07: 1 g via INTRAVENOUS
  Filled 2018-12-07 (×3): qty 1

## 2018-12-07 MED ORDER — VANCOMYCIN HCL 1.25 G IV SOLR
1250.0000 mg | Freq: Once | INTRAVENOUS | Status: AC
  Administered 2018-12-07: 1250 mg via INTRAVENOUS
  Filled 2018-12-07: qty 1250

## 2018-12-07 MED ORDER — VANCOMYCIN HCL IN DEXTROSE 750-5 MG/150ML-% IV SOLN
750.0000 mg | Freq: Once | INTRAVENOUS | Status: DC
  Filled 2018-12-07: qty 150

## 2018-12-07 MED ORDER — ACETAMINOPHEN 325 MG PO TABS
650.0000 mg | ORAL_TABLET | Freq: Once | ORAL | Status: AC
  Administered 2018-12-07: 650 mg
  Filled 2018-12-07: qty 2

## 2018-12-07 MED ORDER — SODIUM CHLORIDE 0.9 % IV SOLN
1.0000 g | Freq: Two times a day (BID) | INTRAVENOUS | Status: DC
  Administered 2018-12-08: 1 g via INTRAVENOUS
  Filled 2018-12-07 (×3): qty 1

## 2018-12-07 MED ORDER — FREE WATER: 200.0000 mL | Status: DC

## 2018-12-07 NOTE — Progress Notes (Signed)
[facility] [place] NAME: Kyle White    MR#:  [id number]  DATE OF BIRTH:  1960/06/08  SUBJECTIVE:  Pt is afebrile,remains intubated on the ventilator, severely hypoxemic with underlying pickwickian syndrome,unsuccessful weaning trials.now has trach  Critically ill, uremic, started hemodialysis--issues with catheter clotting  patient became bradycardia with dialysis yday. Not able to complete the treatment underwent bronchoscopy 11/22/18.  Palliative care team is involved   patient now on IV pressers, amiodarone, fentanyl, precedex  S/p trach 12/16/2018 S/p right femoral catheter  Sister  In the room REVIEW OF SYSTEMS:   Review of Systems  Unable to perform ROS: Intubated  Psychiatric/Behavioral: Nervous/anxious:      DRUG ALLERGIES:   No Active Allergies  VITALS:  Blood pressure 100/87, pulse (!) 119, temperature (!) 100.7 F (38.2 C), temperature source Axillary, resp. rate 20, height 5' 9.02" (1.753 m), weight (!) 227 kg, SpO2 (!) 89 %.  PHYSICAL EXAMINATION:   Physical Exam  GENERAL:  58 y.o.-year-old patient lying in the bed with no acute distress. Morbidly obese Morbidly obese Critically ill intubated on the vent EYES: Pupils equal, round, reactive to light and accommodation. No scleral icterus. Extraocular muscles intact.  HEENT: Head atraumatic, normocephalic. Oropharynx and nasopharynx clear. TRACH+ NECK:  Supple, no jugular venous distention. No thyroid enlargement, no tenderness.  LUNGS: Mod breath sounds bilaterally, positive wheezing, rales, rhonchi. CARDIOVASCULAR: S1, S2 normal. No murmurs, rubs, or gallops.  ABDOMEN: Soft, nontender, nondistended. Bowel sounds present. No organomegaly or mass.  EXTREMITIES: edema+NEUROLOGIC: sedated GCS less than 8  right groin HD cath PSYCHIATRIC: sedated  and on the vent SKIN: per RN documetnation  LABORATORY PANEL:  CBC Recent Labs  Lab 12/07/18 0505  WBC 4.1   HGB 9.0*  HCT 28.5*  PLT 51*    Chemistries  Recent Labs  Lab 12/03/18 0435 12/04/18 0438  12/07/18 0505  NA 147* 149*   < > 148*  K 3.5 3.1*   < > 3.5  CL 110 111   < > 114*  CO2 22 22   < > 23  GLUCOSE 292* 181*   < > 182*  BUN 148* 144*   < > 118*  CREATININE 3.53* 3.38*   < > 2.55*  CALCIUM 8.1* 8.0*   < > 7.9*  MG  --  2.9*  --   --   AST 35  --   --   --   ALT 223*  --   --   --   ALKPHOS 114  --   --   --   BILITOT 0.8  --   --   --    < > = values in this interval not displayed.   Cardiac Enzymes No results for input(s): TROPONINI in the last 168 hours. RADIOLOGY:  No results found. ASSESSMENT AND PLAN:  Kyle White  is a 58 y.o. male with a known history of atrial fibrillation on Coumadin, CHF, diabetes mellitus, and obstructive sleep apnea with CPAP use at home, morbid obesity with sedentary lifestyle on oxygen at 2 L/min at home.  1.Acute on chronic  respiratory failure with severe hypoxia- underlying pickwickian syndrome -No clinical improvement, unsuccessful weaning trials--s/p tracheostomy 11/21/2018 -  -patient underwent bronchoscopy 11/22/2018. 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 Acute renal failure on chronic kidney disease stage III--NOW on HD  with baseline creatinine 1.28-1.89-1.87-2.25-2.21-3.05--3.17-3.11--2.5 -good UOP -continue to monitor renal function closely -Patient being uremic nephrology started the patient on hemodialysis--now on hold due  to hemodynamic instability -Has issues with Perm cath--has right groin temp cath +  #  Type IIDiabetes mellitus -Sliding scale insulin + lantus -Hemoglobin A1c 7.3    Overall poor prognosis. Appreciate palliative care input.   CODE STATUS: DNR  DVT Prophylaxis: scd as patient is thrombocytopenic  TOTAL TIME TAKING CARE OF THIS PATIENT:25 minutes.  >50% time spent on counselling and coordination of care  POSSIBLE D/C IN *?* DAYS, DEPENDING ON CLINICAL CONDITION.  Note: This dictation was prepared with Dragon dictation along with smaller phrase technology. Any transcriptional errors that result from this process are unintentional.   Fritzi Mandes M.D on 12/07/2018 at 3:02 PM  Between 7am to 6pm - Pager - [phone]  After 6pm go to [email or web address] - password [email or web address] [facility]  Office  [phone]  CC: Primary care physician; Inc, [place] ServicesPatient ID: Kyle White, male   DOB: [birth year], 58 y.o.   MRN: [id number]

## 2018-12-07 NOTE — Progress Notes (Addendum)
Follow up - Critical Care Medicine Note  Patient Details:    Kyle White is an 58 y.o. male with hx of type 2 diabetes, chronic AF, pickwickian syndrome, hypertension, congestive heart failure adm 8/28 via ED to ICU/SDU with acute on chronic respiratory failure felt to be due to CHF/pulmonary edema.  Has required intubation and mechanical ventilation and because of failure to be liberated from the vent now has tracheostomy in place.  Lines, Airways, Drains: CVC Triple Lumen 11/22/18 Right Internal jugular (Active)  Indication for Insertion or Continuance of Line Poor Vasculature-patient has had multiple peripheral attempts or PIVs lasting less than 24 hours 12/07/18 0750  Site Assessment Clean;Dry;Intact 12/07/18 0750  Proximal Lumen Status Infusing;Flushed;Blood return noted 12/07/18 0750  Medial Lumen Status Infusing;Flushed;Blood return noted 12/07/18 0750  Distal Lumen Status Infusing;Flushed;Blood return noted 12/07/18 0750  Dressing Type Transparent 12/07/18 0750  Dressing Status Clean;Dry;Intact;Antimicrobial disc in place 12/07/18 [place] checked and tightened 12/07/18 0750  Dressing Intervention New dressing;Dressing changed;Antimicrobial disc changed 12/06/18 0300  Dressing Change Due 12/13/18 12/07/18 0750     NG/OG Tube Nasogastric Right nare  Documented cm marking at nare/ corner of mouth (Active)  Cm Marking at Nare/Corner of Mouth (if applicable) 80 cm 24/23/53 1600  Site Assessment Clean;Dry;Intact 12/07/18 1600  Ongoing Placement Verification No change in respiratory status;No acute changes, not attributed to clinical condition;Xray 12/07/18 1600  Status Infusing tube feed 12/07/18 1600  Amount of suction 15 mmHg 12/06/18 1200  Drainage Appearance None 12/06/18 0758  Intake (mL) 200 mL 12/06/18 2200     Rectal Tube/Pouch (Active)  Date Prophylactic Dressing Applied (if applicable) 61/44/31 54/00/86 1600  Output (mL) 200 mL 12/06/18 1828  Intake  (mL) 50 mL 12/07/18 0800     Urethral Catheter [name], RN Double-lumen 16 Fr. (Active)  Indication for Insertion or Continuance of Catheter Therapy based on hourly urine output monitoring and documentation for critical condition (NOT STRICT I&O) 12/07/18 0400  Site Assessment Intact;Clean;Dry 12/07/18 0751  Catheter Maintenance Bag below level of bladder;Catheter secured;Drainage bag/tubing not touching floor;Insertion date on drainage bag;No dependent loops;Seal intact;Bag emptied prior to transport 12/07/18 0751  Collection Container Standard drainage bag 12/07/18 0751  Securement Method Securing device (Describe) 12/07/18 0751  Urinary Catheter Interventions (if applicable) Unclamped 76/19/50 0751  Input (mL) 0 mL 12/02/18 2000  Output (mL) 188 mL 12/07/18 1550    Anti-infectives:  Anti-infectives (From admission, onward)   Start     Dose/Rate Route Frequency Ordered Stop   12/07/18 1800  meropenem (MERREM) 1 g in sodium chloride 0.9 % 100 mL IVPB     1 g 200 mL/hr over 30 Minutes Intravenous Every 8 hours 12/07/18 1716     12/07/18 1715  vancomycin (VANCOCIN) IVPB 750 mg/150 ml premix  Status:  Discontinued     750 mg 150 mL/hr over 60 Minutes Intravenous  Once 12/07/18 1706 12/07/18 1721   12/07/18 1500  vancomycin (VANCOCIN) 1,250 mg in sodium chloride 0.9 % 250 mL IVPB     1,250 mg 166.7 mL/hr over 90 Minutes Intravenous  Once 12/07/18 1403 12/07/18 1709   12/06/18 1400  vancomycin (VANCOCIN) 1,250 mg in sodium chloride 0.9 % 250 mL IVPB  Status:  Discontinued     1,250 mg 166.7 mL/hr over 90 Minutes Intravenous  Once 12/06/18 1240 12/06/18 1420   11/21/2018 1500  vancomycin (VANCOCIN) 1,250 mg in sodium chloride 0.9 % 250 mL IVPB     1,250 mg 166.7 mL/hr  over 90 Minutes Intravenous  Once 12/04/2018 1411 12/06/2018 1640   12/03/18 1513  vancomycin variable dose per unstable renal function (pharmacist dosing)  Status:  Discontinued      Does not apply See admin instructions  12/03/18 1514 12/07/18 1527   12/03/18 1015  vancomycin (VANCOCIN) 2,000 mg in sodium chloride 0.9 % 500 mL IVPB     2,000 mg 250 mL/hr over 120 Minutes Intravenous  Once 12/03/18 1009 12/03/18 1346   12/02/18 0945  piperacillin-tazobactam (ZOSYN) IVPB 3.375 g  Status:  Discontinued     3.375 g 12.5 mL/hr over 240 Minutes Intravenous Every 8 hours 12/02/18 0938 12/03/18 1009   11/30/18 1800  meropenem (MERREM) 500 mg in sodium chloride 0.9 % 100 mL IVPB  Status:  Discontinued     500 mg 200 mL/hr over 30 Minutes Intravenous Daily-1800 11/29/18 1500 11/30/18 0901   11/30/18 1000  cefTRIAXone (ROCEPHIN) 2 g in sodium chloride 0.9 % 100 mL IVPB  Status:  Discontinued     2 g 200 mL/hr over 30 Minutes Intravenous Every 24 hours 11/30/18 0852 12/02/18 0934   11/28/18 1530  vancomycin (VANCOCIN) 1,250 mg in sodium chloride 0.9 % 250 mL IVPB  Status:  Discontinued     1,250 mg 166.7 mL/hr over 90 Minutes Intravenous Every 24 hours 11/28/18 1510 11/29/18 1553   11/28/18 1500  vancomycin (VANCOCIN) 1,250 mg in sodium chloride 0.9 % 250 mL IVPB  Status:  Discontinued     1,250 mg 166.7 mL/hr over 90 Minutes Intravenous Every 24 hours 11/28/18 1458 11/28/18 1510   11/28/18 1215  meropenem (MERREM) 1 g in sodium chloride 0.9 % 100 mL IVPB  Status:  Discontinued     1 g 200 mL/hr over 30 Minutes Intravenous Every 12 hours 11/28/18 1206 11/29/18 1500   11/27/18 1500  vancomycin (VANCOCIN) 1,750 mg in sodium chloride 0.9 % 500 mL IVPB  Status:  Discontinued     1,750 mg 250 mL/hr over 120 Minutes Intravenous Every 24 hours 11/27/18 1355 11/28/18 0726   11/26/18 1100  vancomycin (VANCOCIN) 2,500 mg in sodium chloride 0.9 % 500 mL IVPB     2,500 mg 250 mL/hr over 120 Minutes Intravenous  Once 11/26/18 1040 11/26/18 1315   11/26/18 1058  vancomycin variable dose per unstable renal function (pharmacist dosing)  Status:  Discontinued      Does not apply See admin instructions 11/26/18 1058 11/27/18 1658    11/22/18 1015  ceFEPIme (MAXIPIME) 2 g in sodium chloride 0.9 % 100 mL IVPB  Status:  Discontinued     2 g 200 mL/hr over 30 Minutes Intravenous Every 8 hours 11/22/18 1009 11/27/18 1027      Microbiology: Results for orders placed or performed during the hospital encounter of 11/03/2018  SARS CORONAVIRUS 2 (TAT 6-12 HRS) Nasal Swab Aptima Multi Swab     Status: None   Collection Time: 11/05/2018  4:55 PM   Specimen: Aptima Multi Swab; Nasal Swab  Result Value Ref Range Status   SARS Coronavirus 2 NEGATIVE NEGATIVE Final    Comment: (NOTE) SARS-CoV-2 target nucleic acids are NOT DETECTED. The SARS-CoV-2 RNA is generally detectable in upper and lower respiratory specimens during the acute phase of infection. Negative results do not preclude SARS-CoV-2 infection, do not rule out co-infections with other pathogens, and should not be used as the sole basis for treatment or other patient management decisions. Negative results must be combined with clinical observations, patient history, and epidemiological information. The  expected result is Negative. Fact Sheet for Patients: SugarRoll.be Fact Sheet for Healthcare Providers: https://www.woods-mathews.com/ This test is not yet approved or cleared by the [place] FDA and  has been authorized for detection and/or diagnosis of SARS-CoV-2 by FDA under an Emergency Use Authorization (EUA). This EUA will remain  in effect (meaning this test can be used) for the duration of the COVID-19 declaration under Section 56 4(b)(1) of the Act, 21 U.S.C. section 360bbb-3(b)(1), unless the authorization is terminated or revoked sooner. Performed at [facility], [place] [address], [place], [place] [address]   SARS Coronavirus 2 [facility] order, Performed in [facility] hospital lab) Nasopharyngeal Nasopharyngeal Swab     Status: None   Collection Time: 10/29/2018 10:34 PM   Specimen: Nasopharyngeal Swab   Result Value Ref Range Status   SARS Coronavirus 2 NEGATIVE NEGATIVE Final    Comment: (NOTE) If result is NEGATIVE SARS-CoV-2 target nucleic acids are NOT DETECTED. The SARS-CoV-2 RNA is generally detectable in upper and lower  respiratory specimens during the acute phase of infection. The lowest  concentration of SARS-CoV-2 viral copies this assay can detect is 250  copies / mL. A negative result does not preclude SARS-CoV-2 infection  and should not be used as the sole basis for treatment or other  patient management decisions.  A negative result may occur with  improper specimen collection / handling, submission of specimen other  than nasopharyngeal swab, presence of viral mutation(s) within the  areas targeted by this assay, and inadequate number of viral copies  (<250 copies / mL). A negative result must be combined with clinical  observations, patient history, and epidemiological information. If result is POSITIVE SARS-CoV-2 target nucleic acids are DETECTED. The SARS-CoV-2 RNA is generally detectable in upper and lower  respiratory specimens dur ing the acute phase of infection.  Positive  results are indicative of active infection with SARS-CoV-2.  Clinical  correlation with patient history and other diagnostic information is  necessary to determine patient infection status.  Positive results do  not rule out bacterial infection or co-infection with other viruses. If result is PRESUMPTIVE POSTIVE SARS-CoV-2 nucleic acids MAY BE PRESENT.   A presumptive positive result was obtained on the submitted specimen  and confirmed on repeat testing.  While 2019 novel coronavirus  (SARS-CoV-2) nucleic acids may be present in the submitted sample  additional confirmatory testing may be necessary for epidemiological  and / or clinical management purposes  to differentiate between  SARS-CoV-2 and other Sarbecovirus currently known to infect humans.  If clinically indicated additional  testing with an alternate test  methodology [phone]) is advised. The SARS-CoV-2 RNA is generally  detectable in upper and lower respiratory sp ecimens during the acute  phase of infection. The expected result is Negative. Fact Sheet for Patients:  StrictlyIdeas.no Fact Sheet for Healthcare Providers: BankingDealers.co.za This test is not yet approved or cleared by the [place] FDA and has been authorized for detection and/or diagnosis of SARS-CoV-2 by FDA under an Emergency Use Authorization (EUA).  This EUA will remain in effect (meaning this test can be used) for the duration of the COVID-19 declaration under Section 564(b)(1) of the Act, 21 U.S.C. section 360bbb-3(b)(1), unless the authorization is terminated or revoked sooner. Performed at [facility], [address] [place], [place] [address]   Culture, respiratory (non-expectorated)     Status: None   Collection Time: 11/14/2018 10:34 PM   Specimen: Tracheal Aspirate; Respiratory  Result Value Ref Range  Status   Specimen Description   Final    TRACHEAL ASPIRATE Performed at [facility], [address] [place], [place] [address]    Special Requests   Final    NONE Performed at [facility], [address] [place], [place] [address]    Gram Stain   Final    RARE WBC PRESENT, PREDOMINANTLY PMN ABUNDANT GRAM POSITIVE COCCI IN CHAINS    Culture   Final    Consistent with normal respiratory flora. Performed at [facility], [place] [address] [place], [place] [address]    Report Status 11/19/2018 FINAL  Final  Urine Culture     Status: None   Collection Time: 11/07/2018 11:12 PM   Specimen: Urine, Random  Result Value Ref Range Status   Specimen Description   Final    URINE, RANDOM Performed at [facility], [address] [place], [place] [address]    Special Requests   Final    NONE Performed at [facility],  [address], [place], [place] [address]    Culture   Final    NO GROWTH Performed at [facility], [place] [address] [place], [place] [address]    Report Status 11/18/2018 FINAL  Final  MRSA PCR Screening     Status: None   Collection Time: 11/10/2018 11:28 PM   Specimen: Nasopharyngeal  Result Value Ref Range Status   MRSA by PCR NEGATIVE NEGATIVE Final    Comment:        The GeneXpert MRSA Assay (FDA approved for NASAL specimens only), is one component of a comprehensive MRSA colonization surveillance program. It is not intended to diagnose MRSA infection nor to guide or monitor treatment for MRSA infections. Performed at [facility], [address] [place], [place] [address]   Culture, blood (Routine X 2) w Reflex to ID Panel     Status: None   Collection Time: 11/17/18 12:19 AM   Specimen: BLOOD  Result Value Ref Range Status   Specimen Description BLOOD RIGHT ANTECUBITAL  Final   Special Requests   Final    BOTTLES DRAWN AEROBIC AND ANAEROBIC Blood Culture adequate volume   Culture   Final    NO GROWTH 5 DAYS Performed at [facility], [address], [place], [place] [address]    Report Status 11/22/2018 FINAL  Final  Culture, blood (Routine X 2) w Reflex to ID Panel     Status: None   Collection Time: 11/17/18 12:31 AM   Specimen: BLOOD  Result Value Ref Range Status   Specimen Description BLOOD CENTRAL LINE MIDLINE  Final   Special Requests   Final    BOTTLES DRAWN AEROBIC AND ANAEROBIC Blood Culture adequate volume   Culture   Final    NO GROWTH 5 DAYS Performed at [facility], [address], [place], [place] [address]    Report Status 11/22/2018 FINAL  Final  Culture, bal-quantitative     Status: Abnormal   Collection Time: 11/22/18  7:48 AM   Specimen: Bronchoalveolar Lavage; Respiratory  Result Value Ref Range Status   Specimen Description   Final    BRONCHIAL ALVEOLAR LAVAGE Performed at [facility],  [address], [place], [place] [address]    Special Requests   Final    Immunocompromised Performed at [facility], [address] [place],  [address]    Gram Stain   Final    FEW WBC PRESENT,  PREDOMINANTLY PMN FEW GRAM POSITIVE COCCI IN PAIRS IN CLUSTERS FEW GRAM POSITIVE RODS Performed at [facility], [place] [address], [place], [place] [address]    Culture (A)  Final    >=100,000 COLONIES/mL STAPHYLOCOCCUS SIMULANS >=100,000 COLONIES/mL STREPTOCOCCUS MITIS/ORALIS    Report Status 11/25/2018 FINAL  Final   Organism ID, Bacteria STAPHYLOCOCCUS SIMULANS (A)  Final   Organism ID, Bacteria STREPTOCOCCUS MITIS/ORALIS (A)  Final      Susceptibility   Staphylococcus simulans - MIC*    CIPROFLOXACIN <=0.5 SENSITIVE Sensitive     ERYTHROMYCIN <=0.25 SENSITIVE Sensitive     GENTAMICIN <=0.5 SENSITIVE Sensitive     OXACILLIN <=0.25 SENSITIVE Sensitive     TETRACYCLINE >=16 RESISTANT Resistant     VANCOMYCIN <=0.5 SENSITIVE Sensitive     TRIMETH/SULFA <=10 SENSITIVE Sensitive     CLINDAMYCIN <=0.25 SENSITIVE Sensitive     RIFAMPIN <=0.5 SENSITIVE Sensitive     Inducible Clindamycin NEGATIVE Sensitive     * >=100,000 COLONIES/mL STAPHYLOCOCCUS SIMULANS   Streptococcus mitis/oralis - MIC*    TETRACYCLINE >=16 RESISTANT Resistant     VANCOMYCIN 0.25 SENSITIVE Sensitive     CLINDAMYCIN 0.5 INTERMEDIATE Intermediate     * >=100,000 COLONIES/mL STREPTOCOCCUS MITIS/ORALIS  CULTURE, BLOOD (ROUTINE X 2) w Reflex to ID Panel     Status: None   Collection Time: 11/26/18 10:32 PM   Specimen: BLOOD  Result Value Ref Range Status   Specimen Description BLOOD RIGHT ANTECUBITAL  Final   Special Requests   Final    BOTTLES DRAWN AEROBIC AND ANAEROBIC Blood Culture adequate volume   Culture   Final    NO GROWTH 5 DAYS Performed at [facility], [address] [place], [place] [address]    Report Status 12/01/2018 FINAL  Final  CULTURE, BLOOD (ROUTINE X 2) w  Reflex to ID Panel     Status: None   Collection Time: 11/26/18 10:32 PM   Specimen: BLOOD  Result Value Ref Range Status   Specimen Description BLOOD BLOOD RIGHT HAND  Final   Special Requests   Final    BOTTLES DRAWN AEROBIC AND ANAEROBIC Blood Culture adequate volume   Culture   Final    NO GROWTH 5 DAYS Performed at [facility], [address] [place], [place] [address]    Report Status 12/01/2018 FINAL  Final  Respiratory Panel by PCR     Status: None   Collection Time: 11/28/18 10:36 AM   Specimen: Nasopharyngeal Swab; Respiratory  Result Value Ref Range Status   Adenovirus NOT DETECTED NOT DETECTED Final   Coronavirus 229E NOT DETECTED NOT DETECTED Final    Comment: (NOTE) The Coronavirus on the Respiratory Panel, DOES NOT test for the novel  Coronavirus (2019 nCoV)    Coronavirus HKU1 NOT DETECTED NOT DETECTED Final   Coronavirus NL63 NOT DETECTED NOT DETECTED Final   Coronavirus OC43 NOT DETECTED NOT DETECTED Final   Metapneumovirus NOT DETECTED NOT DETECTED Final   Rhinovirus / Enterovirus NOT DETECTED NOT DETECTED Final   Influenza A NOT DETECTED NOT DETECTED Final   Influenza B NOT DETECTED NOT DETECTED Final   Parainfluenza Virus 1 NOT DETECTED NOT DETECTED Final   Parainfluenza Virus 2 NOT DETECTED NOT DETECTED Final   Parainfluenza Virus 3 NOT DETECTED NOT DETECTED Final   Parainfluenza Virus 4 NOT DETECTED NOT DETECTED Final   Respiratory Syncytial Virus NOT DETECTED NOT DETECTED Final   Bordetella pertussis NOT DETECTED NOT DETECTED Final   Chlamydophila  pneumoniae NOT DETECTED NOT DETECTED Final   Mycoplasma pneumoniae NOT DETECTED NOT DETECTED Final    Comment: Performed at [facility], [place] [address], [place], [place] [address]  Culture, respiratory (non-expectorated)     Status: None   Collection Time: 11/28/18 11:29 AM   Specimen: Tracheal Aspirate; Respiratory  Result Value Ref Range Status   Specimen Description   Final    TRACHEAL  ASPIRATE Performed at [facility] [address] [place] [address], [place], [place] [address]    Special Requests   Final    NONE Performed at [facility], [address] [place], [place] [address]    Gram Stain   Final    FEW WBC PRESENT, PREDOMINANTLY PMN FEW SQUAMOUS EPITHELIAL CELLS PRESENT RARE GRAM POSITIVE COCCI IN PAIRS    Culture   Final    FEW Consistent with normal respiratory flora. Performed at [facility], [place] [address], [place], [place] [address]    Report Status 11/30/2018 FINAL  Final  Culture, respiratory (non-expectorated)     Status: None   Collection Time: 12/02/18  8:45 AM   Specimen: Tracheal Aspirate; Respiratory  Result Value Ref Range Status   Specimen Description   Final    TRACHEAL ASPIRATE Performed at [facility], [address], [place], [place] [address]    Special Requests   Final    NONE Performed at [facility], [address] [place], [place] [address]    Gram Stain   Final    NO WBC SEEN NO ORGANISMS SEEN Performed at [facility], [place] [address] [place],  [address]    Culture FEW CANDIDA ALBICANS  Final   Report Status 12/04/2018 FINAL  Final    Best Practice/Protocols:  VTE Prophylaxis: Mechanical GI Prophylaxis: Proton Pump Inhibitor Intermittent Sedation Hyperglycemia (ICU)  Events: 08/28 admitted to ICU/SDU for BiPAP. PCCM consultation 08/28 emergently intubated 08/29 Echocardiogram: EF 55-60%. LV cavity mildly dilated. Diastolic function could not be evaluated. Left atrium mildly dilated. RV not assessed. RA size not well visualized. 08/29 CTAP: No acute intra-abdominal process noted. No perforation. Trace ascites. Severe hepatic steatosis. Cholelithiasis. Small R and trace L pleural effusions. Bilateral lower lobe airspace disease may represent atelectasis but pneumonia is difficult to exclude, particularly on the R. 08/30 Extubated 08/30 Cardiology  consultation: recommended diltiazem gtt for rapid AF and continue anticoagulation 08/31 reintubated emergently 08/31 bronchoscopy for mucous plugging and complete collapse of R lung 08/31 Palliative Care consultation to address goals of care 09/03 DNR established 09/05 Nephrology consultation for AKI 09/06 Renal RN:HAFBXU appearance of the kidneys 09/06 requiring 100% FiO2 and PEEP 15 cm H2O 09/07 ETT cuff leak, ETT changed 09/08 CTH: No acute intracranial abnormality seen 09/10 HD cath placed and intermittent HD initiated 09/11 + F/C. Improving vent requirements. 2 hrs of HD performed 09/12 improving vent requirements. Low-dose clonazepam and Duragesic patch initiated. Goals of care discussion with patient's sister, consent for tracheostomy tube. 09/13 Severe desaturation with increased purulent respiratory secretions and volume loss due to mucus plugging.Respiratory culture obtained. Antibiotics expanded to piperacillin-tazobactam. DNR confirmed with patient's sister. Continuous sedation infusions initiated 9/14 ETT dislodged, s/p bronch to adjust ETT, extubated and re-intubated Patient has tracheomegaly, plan for trach 9/15 remains on high fio2 9/16 s/p trach 9/17 did not tolerate dialysis due to hemodynamic instability.  Persistent shock physiology on pressors 9/18 re-spiked temperatures, culture sputum, Merrem restarted.  Studies: Ct Abdomen Pelvis Wo Contrast  Result Date: 11/17/2018  CLINICAL DATA:  Abdominal rigidity. Concern for perforated viscus. Currently intubated. EXAM: CT ABDOMEN AND PELVIS WITHOUT CONTRAST TECHNIQUE: Multidetector CT imaging of the abdomen and pelvis was performed following the standard protocol without IV contrast. COMPARISON:  None. FINDINGS: Lower chest: Small right and trace left pleural effusions with bilateral lower lobe airspace disease. Small amount of herniated right middle lobe between the fifth and sixth ribs. Hepatobiliary: Severe hepatic  steatosis. Several small gallstones. No gallbladder wall thickening or biliary dilatation. Pancreas: Unremarkable. No pancreatic ductal dilatation or surrounding inflammatory changes. Spleen: Normal in size without focal abnormality. Adrenals/Urinary Tract: Adrenal glands are unremarkable. Punctate calculus in the lower pole the left kidney. No hydronephrosis. Bladder is decompressed by Foley catheter. Stomach/Bowel: Enteric tube tip in the distal stomach. Stomach is within normal limits. Appendix appears normal. No evidence of bowel wall thickening, distention, or inflammatory changes. Vascular/Lymphatic: Aortic atherosclerosis. No enlarged abdominal or pelvic lymph nodes. Reproductive: Prostate is unremarkable. Other: Trace ascites.  No pneumoperitoneum. Musculoskeletal: No acute or significant osseous findings. IMPRESSION: 1.  No acute intra-abdominal process.  No perforation. 2. Trace ascites. 3. Severe hepatic steatosis. 4. Cholelithiasis. 5. Punctate nonobstructive left nephrolithiasis. 6. Small right and trace left pleural effusions. Bilateral lower lobe airspace disease may represent atelectasis, but pneumonia is difficult to exclude, particularly on the right. Electronically Signed   By: Titus Dubin M.D.   On: 11/17/2018 13:50   Dg Abd 1 View  Result Date: 12/03/2018 CLINICAL DATA:  Check gastric catheter placement EXAM: ABDOMEN - 1 VIEW COMPARISON:  Film from earlier in the same day. FINDINGS: Gastric catheter has been advanced several cm and now lies with the tip in the distal stomach. IMPRESSION: Gastric catheter within the stomach. Electronically Signed   By: Inez Catalina M.D.   On: 12/03/2018 20:50   Dg Abd 1 View  Result Date: 12/03/2018 CLINICAL DATA:  Check gastric catheter placement EXAM: ABDOMEN - 1 VIEW COMPARISON:  11/30/2018 FINDINGS: Gastric catheter is noted within the distal esophagus but does not appear to have reached the gastric lumen. Scattered large and small bowel gas is  noted. IMPRESSION: Gastric catheter within the distal esophagus. This should be advanced several cm. Electronically Signed   By: Inez Catalina M.D.   On: 12/03/2018 20:50   Dg Abd 1 View  Result Date: 11/30/2018 CLINICAL DATA:  OG tube placement. EXAM: ABDOMEN - 1 VIEW COMPARISON:  11/26/2018 FINDINGS: 1329 hours. Lower left chest and upper left abdomen have been included in the field of view. NG tube tip is identified in the mid stomach. Cardiopericardial silhouette appears enlarged with left base retrocardiac collapse/consolidation. IMPRESSION: OG tube tip is in the mid stomach. Electronically Signed   By: Misty Stanley M.D.   On: 11/30/2018 15:21   Dg Abd 1 View  Result Date: 11/26/2018 CLINICAL DATA:  ET and NG tube placement. CXR approved by MD at bedside. MD states no repeat necessary. Best attainable images due to patient conditions. EXAM: ABDOMEN - 1 VIEW COMPARISON:  Abdominal radiograph 11/19/2018 FINDINGS: The nasogastric tube distal aspect is coiled overlying the stomach. Paucity of bowel gas. No supine evidence for free intraperitoneal air. Left retrocardiac opacity and probable small effusion. Probable right basilar atelectasis. IMPRESSION: The nasogastric tube side port projects over the stomach. Electronically Signed   By: Audie Pinto M.D.   On: 11/26/2018 16:48   Dg Abd 1 View  Result Date: 11/17/2018 CLINICAL DATA:  Abdomen distension tube placement EXAM: ABDOMEN - 1 VIEW COMPARISON:  11/14/2018  FINDINGS: Esophageal tube tip visible to the distal stomach but incompletely included. Airspace disease at the right base. IMPRESSION: Esophageal tubing visible to the gastric body but tip is incompletely included in the field of view. Electronically Signed   By: Donavan Foil M.D.   On: 11/17/2018 00:10   Dg Abd 1 View  Result Date: 11/07/2018 CLINICAL DATA:  Abdominal distention EXAM: ABDOMEN - 1 VIEW COMPARISON:  None. FINDINGS: Limited study due to portable nature of the study and  body habitus. I see no significant bowel dilatation or free air. Study otherwise limited. IMPRESSION: Very limited study. No visible changes of bowel obstruction or free air. Electronically Signed   By: Rolm Baptise M.D.   On: 11/06/2018 22:33   Ct Head Wo Contrast  Result Date: 11/27/2018 CLINICAL DATA:  Altered level of consciousness. EXAM: CT HEAD WITHOUT CONTRAST TECHNIQUE: Contiguous axial images were obtained from the base of the skull through the vertex without intravenous contrast. COMPARISON:  None. FINDINGS: Brain: No evidence of acute infarction, hemorrhage, hydrocephalus, extra-axial collection or mass lesion/mass effect. Vascular: No hyperdense vessel or unexpected calcification. Skull: Normal. Negative for fracture or focal lesion. Sinuses/Orbits: Fluid is noted in bilateral mastoid air cells. Bilateral ethmoid and sphenoid sinusitis is noted. Other: None. IMPRESSION: Bilateral ethmoid and sphenoid sinusitis. No acute intracranial abnormality seen. Electronically Signed   By: Marijo Conception M.D.   On: 11/27/2018 15:50   US Renal  Result Date: 11/25/2018 CLINICAL DATA:  Acute renal failure. EXAM: RENAL / URINARY TRACT ULTRASOUND COMPLETE COMPARISON:  Body CT November 17, 2018 FINDINGS: Right Kidney: Renal measurements: 12.2 x 6.9 x 7.5 cm = volume: 334 mL . Echogenicity within normal limits. No mass or hydronephrosis visualized. Left Kidney: Renal measurements: 14.9 x 6.5 x 6.7 cm = volume: 341 mL. Echogenicity within normal limits. No mass or hydronephrosis visualized. Bladder: Decompressed around urinary Foley and therefore not well evaluated. IMPRESSION: Normal appearance of the kidneys, accounting for the technical limitations of the study caused by body habitus. Electronically Signed   By: Fidela Salisbury M.D.   On: 11/25/2018 15:43   Dg Chest Port 1 View  Result Date: 12/07/2018 CLINICAL DATA:  Respiratory failure EXAM: PORTABLE CHEST 1 VIEW COMPARISON:  December 03, 2018 FINDINGS:  Limited due to patient position. Endotracheal tube tip is seen 4 cm above the carina. NG tube is seen coursing below the diaphragm. A right-sided central venous catheter seen at the superior cavoatrial junction. Hazy ground-glass and interstitial opacities seen throughout both lungs. There is interval improvement in the right lung aeration. Again noted is cardiomegaly and a probable small bilateral pleural effusions. IMPRESSION: 1. Support lines and tubes in satisfactory position. 2. Interval improvement in the right lung aeration. 3. Diffuse hazy ground-glass and interstitial opacities , which could be due to infection/atelectasis. 4. Small bilateral pleural effusions Electronically Signed   By: Prudencio Pair M.D.   On: 12/07/2018 15:23   Dg Chest Port 1 View  Result Date: 12/03/2018 CLINICAL DATA:  Check endotracheal tube placement EXAM: PORTABLE CHEST 1 VIEW COMPARISON:  Film from earlier in the same day. FINDINGS: Endotracheal tube has been advanced and now lies approximately 2.8 cm above the carina. Gastric catheter is again seen although the tip of the catheter is not appreciated on this exam. Bilateral jugular central lines are again seen and stable. Increasing opacification in the right hemithorax is noted likely related to a combination of consolidation and effusion. The left lung is clear with the exception  of vascular congestion. IMPRESSION: Tubes and lines as described. Persistent vascular congestion. Increasing right hemithorax opacification consistent with a combination of consolidation and effusion. Electronically Signed   By: Inez Catalina M.D.   On: 12/03/2018 20:49   Dg Chest Port 1 View  Result Date: 12/03/2018 CLINICAL DATA:  Endotracheal tube adjustment EXAM: PORTABLE CHEST 1 VIEW COMPARISON:  Chest radiograph from earlier today. FINDINGS: Endotracheal tube tip is 7.0 cm above the carina, at the level of thoracic inlet. Enteric tube enters stomach with the tip not seen on this image. Left  internal jugular central venous catheter terminates in the left brachiocephalic vein near the midline. Right internal jugular central venous catheter terminates in the middle third of the SVC. Stable cardiomediastinal silhouette with moderate cardiomegaly. No pneumothorax. Stable small bilateral pleural effusions, noting portion of the left costophrenic angle not included on this image. Moderate pulmonary edema appears similar. Bibasilar opacity, favor atelectasis, unchanged. IMPRESSION: 1. Endotracheal tube tip 7.0 cm above the carina, located at the level of the thoracic inlet. Additional support structures as detailed. No pneumothorax. 2. Stable moderate congestive heart failure with small bilateral pleural effusions and bibasilar lung opacities, favor atelectasis. Electronically Signed   By: Ilona Sorrel M.D.   On: 12/03/2018 19:48   Dg Chest Port 1 View  Result Date: 12/03/2018 CLINICAL DATA:  Check endotracheal tube placement EXAM: PORTABLE CHEST 1 VIEW COMPARISON:  12/03/2018 FINDINGS: Endotracheal tube is again noted at the level of the thoracic inlet. Gastric catheter is noted extending into the stomach. Bilateral jugular central lines are again seen and stable. Diffuse vascular congestion is noted. Mild interstitial edema is seen. Cardiomegaly is noted. Likely small posterior effusions are present as well. No bony abnormality is seen. IMPRESSION: Vascular congestion and edema with mild effusions. Electronically Signed   By: Inez Catalina M.D.   On: 12/03/2018 19:19   Dg Chest Port 1 View  Result Date: 12/03/2018 CLINICAL DATA:  Respiratory failure. EXAM: PORTABLE CHEST 1 VIEW COMPARISON:  Radiograph of December 19, 2018. FINDINGS: Stable cardiomegaly. Endotracheal and nasogastric tubes are unchanged in position. Bilateral internal jugular catheters are noted which are unchanged in position. No pneumothorax is noted. Stable bibasilar atelectasis is noted with small pleural effusions. Bony thorax is  unremarkable. IMPRESSION: Stable support apparatus. Stable bibasilar opacities as described above. Electronically Signed   By: Marijo Conception M.D.   On: 12/03/2018 07:41   Dg Chest Port 1 View  Result Date: 12/02/2018 CLINICAL DATA:  ET placement EXAM: PORTABLE CHEST 1 VIEW COMPARISON:  Same-day radiograph FINDINGS: Portion of the left lung base is collimated from view. Interval placement of the endotracheal tube within the upper trachea approximately 8 cm from the carina. Recommend advancement 3-4 cm to position in the mid trachea. Transesophageal tube tip and side port distal to the GE junction. Left IJ catheter tip terminates at the brachiocephalic-caval confluence. Right IJ catheter tip terminates in the lower SVC. Some improving aeration is noted in the right hemithorax. Persistent opacity is noted in both lung bases, right greater than left. Cardiomediastinal silhouette is still largely obscured. IMPRESSION: Interval placement of the endotracheal tube within the upper trachea approximately 8 cm from the carina. Recommend advancement 3-4 cm to position in the mid trachea. Remaining lines and tubes in stable position. Improving aeration of the right lung. These results will be called to the ordering clinician or representative by the Radiologist Assistant, and communication documented in the PACS or zVision Dashboard. Electronically Signed   By:  Lovena Le M.D.   On: 12/02/2018 22:16   Dg Chest Port 1 View  Result Date: 12/02/2018 CLINICAL DATA:  Respiratory failure EXAM: PORTABLE CHEST 1 VIEW COMPARISON:  12/02/2018, 12/01/2018, 11/30/2018 FINDINGS: Endotracheal tube tip slightly above thoracic inlet and is 9.8 cm superior to the carina. Left IJ central venous catheter tip projects over the brachiocephalic confluence. Esophageal tube tip courses below diaphragm but is non included. Right IJ central venous catheter tip over the SVC. Interval diffuse white out of the right thorax. Truncated appearance  of the right bronchus. Suspected moderate left pleural effusion with edema or layering fluid on the left. Consolidation left base. Obscured cardiomediastinal silhouette. IMPRESSION: 1. Endotracheal tube tip slightly above thoracic inlet and is 9.8 cm superior to carina 2. Interval diffuse white out of the right thorax. Truncated appearance of the right bronchus suggestive of occlusive process. 3. Continued left pleural effusion with dense airspace disease at the left base. Electronically Signed   By: Donavan Foil M.D.   On: 12/02/2018 21:27   Dg Chest Port 1 View  Result Date: 12/02/2018 CLINICAL DATA:  Acute respiratory failure. EXAM: PORTABLE CHEST 1 VIEW COMPARISON:  12/01/2018 FINDINGS: [name] rotated to the right. Enteric tube courses into the stomach as tip is not definitely visualized. Endotracheal tube has tip approximately 7 cm above the carina. Left IJ central venous catheter unchanged as well as right IJ central venous catheter unchanged. Exam demonstrates worsening bilateral perihilar/bibasilar opacification. Findings likely due to layering bilateral effusions with bibasilar atelectasis as well as interstitial edema. Infection is possible. Cardiomegaly. Remainder of the exam is unchanged. IMPRESSION: Worsening bilateral perihilar bibasilar opacification likely worsening effusions and CHF. Infection is possible. Tubes and lines as described. Electronically Signed   By: Marin Olp M.D.   On: 12/02/2018 09:13   Dg Chest Port 1 View  Result Date: 12/01/2018 CLINICAL DATA:  Respiratory failure EXAM: PORTABLE CHEST 1 VIEW COMPARISON:  Numerous prior radiographs, most recently 11/30/2010 FINDINGS: Endotracheal tube is positioned within the upper trachea approximately 6 cm from the carina. Distal extent of the transesophageal tube is poorly visualized due to radiographic underpenetration. Left IJ approach central venous catheter tip terminates in the brachiocephalic vein. Right IJ catheter tip  terminates near the superior cavoatrial junction. Redemonstration the bilateral pleural effusions which obscure the hemidiaphragms and resulting gradient basilar opacity. Bilateral airspace disease is grossly similar to comparison study accounting for differences in technique. There is redemonstration of the metallic cerclage wire associated with the right ribs. IMPRESSION: 1. Endotracheal tube tip approximately 6 cm from the carina. 2. Distal extent of the transesophageal tube is poorly visualized due to radiographic underpenetration. 3. Left IJ catheter terminates in the brachiocephalic vein. Right IJ catheter at the superior cavoatrial junction. 4. Grossly stable bilateral pleural effusions and bilateral airspace disease. Electronically Signed   By: Lovena Le M.D.   On: 12/01/2018 03:24   Dg Chest Port 1 View  Result Date: 11/30/2018 CLINICAL DATA:  Acute on chronic respiratory failure with severe hypoxia. ARDS with fever. Pneumonia. EXAM: PORTABLE CHEST 1 VIEW COMPARISON:  Single view of the chest 11/29/2018 and 11/28/2018. FINDINGS: Support tubes and lines are unchanged. Marked cardiomegaly is again seen. Left worse than right pleural effusions and airspace disease persist without change. IMPRESSION: No change in left worse than right pleural effusions and airspace disease. Marked cardiomegaly. No change in support apparatus. Electronically Signed   By: Inge Rise M.D.   On: 11/30/2018 08:37   Dg Chest  Port 1 View  Result Date: 11/29/2018 CLINICAL DATA:  Central line placement. EXAM: PORTABLE CHEST 1 VIEW COMPARISON:  11/28/2018 FINDINGS: [name] rotated to the left. Endotracheal tube has tip 6.8 cm above the carina. Enteric tube courses into the stomach as tip is not visualized. Right IJ central venous catheter unchanged with tip over the SVC. Interval placement of left IJ central venous catheter with tip obliquely oriented over the left brachiocephalic vein in the midline. Lungs are  adequately inflated with mild stable hazy density over the left base likely small left effusion with associated atelectasis. Mild stable prominence of the perihilar markings likely mild vascular congestion. Moderate stable cardiomegaly. Remainder of the exam is unchanged. IMPRESSION: Moderate stable cardiomegaly with mild vascular congestion. Stable hazy density over the left base likely small effusion with associated atelectasis. Tubes and lines as described. Interval placement of left IJ central venous catheter with tip in the midline over the brachiocephalic vein. Electronically Signed   By: Marin Olp M.D.   On: 11/29/2018 15:44   Dg Chest Port 1 View  Result Date: 11/28/2018 CLINICAL DATA:  Fever, intubation EXAM: PORTABLE CHEST 1 VIEW COMPARISON:  11/26/2018 FINDINGS: No significant interval change in AP portable chest radiograph with endotracheal tube over the mid trachea, esophagogastric tube, right neck vascular catheter, cardiomegaly, and layering small bilateral pleural effusions. No new or focal airspace opacity. IMPRESSION: No significant interval change in AP portable chest radiograph with cardiomegaly and layering small pleural effusions. No new or focal airspace opacity. Unchanged support apparatus. Electronically Signed   By: Eddie Candle M.D.   On: 11/28/2018 12:47   Dg Chest Port 1 View  Result Date: 11/26/2018 CLINICAL DATA:  ET and NG tube placement. CXR approved by MD at bedside. MD states no repeat necessary. Best attainable images due to patient conditions. EXAM: PORTABLE CHEST 1 VIEW COMPARISON:  Chest radiograph 11/24/2018, 11/22/2018 FINDINGS: Endotracheal tube tip terminates between the thoracic inlet and carina. The nasogastric tube courses below the diaphragm with nonvisualization of the distal tip. The right central venous catheter tip projects over the SVC. Cardiomegaly. No definite pneumothorax. Poor visualization of the bilateral lung bases but suspect persistent bibasilar  opacities. No acute finding in the visualized skeleton. IMPRESSION: 1. Endotracheal tube tip terminates between the thoracic inlet and carina. 2. Nasogastric tube positioning is better seen on the subsequent abdominal radiograph. 3. Poor visualization of the lung bases but suspect ongoing bibasilar pulmonary opacities. Electronically Signed   By: Audie Pinto M.D.   On: 11/26/2018 16:44   Dg Chest Port 1 View  Result Date: 11/24/2018 CLINICAL DATA:  Shortness of breath EXAM: PORTABLE CHEST 1 VIEW COMPARISON:  November 22, 2018 FINDINGS: Endotracheal tube tip is seen 3.5 cm above the level of carina. NG tube is seen coursing below the diaphragm. A right-sided central venous catheter is seen within the mid SVC./slight interval improved aeration since prior exam. Probable subsegmental atelectasis remains at the lung bases. There is a retrocardiac opacity. IMPRESSION: Slight interval improvement in aeration. However there remains a retrocardiac opacity which could be atelectasis and/or infectious etiology. Electronically Signed   By: Prudencio Pair M.D.   On: 11/24/2018 03:51   Dg Chest Port 1 View  Result Date: 11/22/2018 CLINICAL DATA:  Ventilator support.  Follow-up. EXAM: PORTABLE CHEST 1 VIEW COMPARISON:  Earlier same day FINDINGS: Endotracheal tube tip is 3 cm above the carina. Orogastric or nasogastric tube enters the abdomen. Right internal jugular central line tip in the SVC above  the right atrium. Persistent bilateral lower lung atelectasis and or pneumonia. No worsening or new finding. IMPRESSION: No apparent change since earlier today. Lines and tubes well position. Persistent atelectasis and or pneumonia in the mid and lower lungs. Electronically Signed   By: Nelson Chimes M.D.   On: 11/22/2018 21:23   Dg Chest Port 1 View  Result Date: 11/22/2018 CLINICAL DATA:  Central line placement EXAM: PORTABLE CHEST 1 VIEW COMPARISON:  11/22/2018 FINDINGS: [name] rotated. Interval placement of a right  IJ central venous catheter with distal tip terminating at the expected level of the superior cavoatrial junction. ET tube remains in place with distal tip terminating 3.8 cm superior to the carina. Enteric tube courses below the diaphragm with distal tip beyond the inferior margin of the film. Multiple overlying external leads. Cardiac silhouette is largely obscured but appears enlarged. Bilateral pleural effusions. Volume loss within the right lung. Slight improved aeration of the right lung compared to prior. No pneumothorax visualized. IMPRESSION: 1. Interval placement of right IJ central venous catheter. No pneumothorax. 2. Improving aeration of the right lung. Electronically Signed   By: Davina Poke M.D.   On: 11/22/2018 14:26   Dg Chest Port 1 View  Result Date: 11/22/2018 CLINICAL DATA:  Multiple tracheobronchial mucus plugs, history CHF, diabetes mellitus, atrial fibrillation, hypertension EXAM: PORTABLE CHEST 1 VIEW COMPARISON:  Portable exam 0830 hours compared to 0035 hours FINDINGS: Tip of endotracheal tube projects 4.1 cm above carina. Nasogastric tube extends into abdomen. Volume loss in the RIGHT hemithorax with slight mediastinal shift to the RIGHT, likely reflecting a combination of atelectasis and question effusion. LEFT pleural effusion and basilar atelectasis are present. No pneumothorax. Bones demineralized. IMPRESSION: Atelectasis and probable pleural effusion opacified RIGHT hemithorax with volume loss. Persistent LEFT pleural effusion and basilar atelectasis. Electronically Signed   By: Lavonia Dana M.D.   On: 11/22/2018 08:44   Dg Chest Port 1 View  Result Date: 11/22/2018 CLINICAL DATA:  Hypoxia. EXAM: PORTABLE CHEST 1 VIEW COMPARISON:  Radiograph yesterday at 0550 hour FINDINGS: Endotracheal tube tip 3.6 cm from the carina. Enteric tube in place tip not well visualized. Complete opacification of the right hemithorax which is new from prior exam. Heart size and mediastinal  contours are obscured. Left pleural effusion with volume loss in the left lung. Congestive changes on the left. IMPRESSION: 1. Complete opacification of the right hemithorax which is new from prior exam. Question underlying mucous plugging. 2. Overall low lung volumes. 3. Support apparatus unchanged. Electronically Signed   By: Keith Rake M.D.   On: 11/22/2018 01:11   Dg Chest Port 1 View  Result Date: 11/21/2018 CLINICAL DATA:  Acute respiratory failure. EXAM: PORTABLE CHEST 1 VIEW COMPARISON:  Radiograph of November 19, 2018. FINDINGS: Stable cardiomegaly. Endotracheal and nasogastric tubes are unchanged in position. No pneumothorax is noted. Probable mild to moderate size left pleural effusion is noted with associated atelectasis or infiltrate. Minimal right basilar atelectasis may be present. Bony thorax unremarkable. IMPRESSION: Stable support apparatus. Increased left basilar density is noted concerning for mild to moderate pleural effusion with probable underlying atelectasis or infiltrate. Electronically Signed   By: Marijo Conception M.D.   On: 11/21/2018 08:39   Dg Chest Port 1 View  Result Date: 11/19/2018 CLINICAL DATA:  Respiratory failure. EXAM: PORTABLE CHEST 1 VIEW COMPARISON:  11/17/2018 prior radiographs FINDINGS: An endotracheal tube with tip 4 cm above the carina and NG tube entering the stomach with tip off the field  of view again noted. Cardiomegaly, pulmonary vascular congestion, small bilateral pleural effusions and bilateral LOWER lung atelectasis/consolidation again noted. There is no evidence of pneumothorax. IMPRESSION: Unchanged appearance of the chest with support apparatus as described. Cardiomegaly, pulmonary vascular congestion, small bilateral pleural effusions and bilateral LOWER lung atelectasis/consolidation. Electronically Signed   By: Margarette Canada M.D.   On: 11/19/2018 16:51   Dg Chest Port 1 View  Result Date: 11/17/2018 CLINICAL DATA:  Intubation EXAM: PORTABLE  CHEST 1 VIEW COMPARISON:  11/17/2018, 11/12/2018 FINDINGS: Endotracheal tube tip is about 3.1 cm superior to the carina. Esophageal tube tip extends below the diaphragm but is non included. Bilateral pleural effusions, likely layering on the right. Cardiomegaly with vascular congestion and bilateral pulmonary edema. Basilar consolidations. No pneumothorax. IMPRESSION: 1. Endotracheal tube tip about 3.1 cm superior to carina 2. Cardiomegaly with vascular congestion, pulmonary edema and bilateral pleural effusion Electronically Signed   By: Donavan Foil M.D.   On: 11/17/2018 00:12   Dg Chest Port 1 View  Result Date: 10/30/2018 CLINICAL DATA:  Shortness of breath for 2 weeks. EXAM: PORTABLE CHEST 1 VIEW COMPARISON:  11/04/2010 radiograph FINDINGS: Cardiomegaly with pulmonary vascular congestion noted. Possible mild interstitial opacities/edema noted. No pneumothorax or acute bony abnormality. Bibasilar atelectasis noted. There may be trace pleural effusions present. IMPRESSION: Cardiomegaly with pulmonary vascular congestion and possible mild interstitial edema. Bibasilar atelectasis.  Possible trace pleural effusions. Electronically Signed   By: Margarette Canada M.D.   On: 11/14/2018 17:13   Dg Abd Portable 1v  Result Date: 11/19/2018 CLINICAL DATA:  NG tube placement. EXAM: PORTABLE ABDOMEN - 1 VIEW COMPARISON:  None. FINDINGS: An NG tube is noted with tip overlying the distal stomach. IMPRESSION: NG tube with tip overlying the distal stomach. Electronically Signed   By: Margarette Canada M.D.   On: 11/19/2018 16:51    Consults: Treatment Team:  Corey Skains, MD   Subjective:    Overnight Issues: Poorly responsive, unable to wean.  Massive anasarca.  Mottled appearance particularly distal extremities.  Developed fever, recultured, restarted antibiotics.  Objective:  Vital signs for last 24 hours: Temp:  [99.7 F (37.6 C)-101.9 F (38.8 C)] 100.4 F (38 C) (09/18 1600) Pulse Rate:  [44-139] 137  (09/18 1600) Resp:  [19-23] 19 (09/18 1600) BP: (75-116)/(53-87) 94/77 (09/18 1600) SpO2:  [74 %-98 %] 98 % (09/18 1600) FiO2 (%):  [40 %] 40 % (09/18 1600) Weight:  [227 kg] 227 kg (09/18 0500)  Hemodynamic parameters for last 24 hours:    Intake/Output from previous day: 09/17 0701 - 09/18 0700 In: 7332.5 [I.V.:2272.5; NG/GT:4880] Out: 3293 [Urine:3375; Stool:200]  Intake/Output this shift: Total I/O In: 1864.2 [I.V.:741.4; Other:50; NG/GT:1042.5; IV Piggyback:30.3] Out: 963 [Urine:963]  Vent settings for last 24 hours: Vent Mode: PCV FiO2 (%):  [40 %] 40 % Set Rate:  [20 bmp] 20 bmp PEEP:  [5 cmH20-6 cmH20] 5 cmH20 Plateau Pressure:  [25 cmH20-26 cmH20] 25 cmH20  Physical Exam:  GENERAL: Massively obese gentleman, poorly responsive on minimal sedation.  Tracheostomy in place no vent asynchrony. HEAD: Normocephalic, atraumatic.  EYES: Pupils equal, round, reactive to light.  No scleral icterus.  There is scleral edema. MOUTH: Oral mucosa without thrush. NECK: Supple.   Tracheostomy in place, clean PULMONARY: +rhonchi, +wheezing, scattered rales CARDIOVASCULAR: S1 and S2. Regular rate and rhythm. No murmurs, rubs, or gallops.  GASTROINTESTINAL: Soft, non--distended.  Protuberant/obese.  Positive bowel sounds.  MUSCULOSKELETAL: 3-4+ anasarca.  Mottled toes and fingertips. NEUROLOGIC: Poorly responsive, does  not track SKIN:intact,warm,dry  Assessment/Plan:  1. Acute on chronic hypoxic/hypercapnic respiratory failure with prolonged ventilator dependence:Bilateral pleural effusions with pulmonary edema due to worsening renal failure, underlying extreme obesity with obesity hypoventilation and severe OSA.  Compressive atelectasis has improved however continues to require full ventilatory support.  He is extremely volume overloaded but will not tolerate dialysis.  Ventilator changes made as appropriate. Titrate O2 and PEEP for saturations of 88 to 92%.Bronchodilator  therapy/pulmonary toilet.   Had element of arts initially this appears now to be resolved.  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  2. Persistent shock physiology, on pressors: Complicated by acute diastolic heart failure decompensation:Has not been able to tolerate dialysis.  Has significant volume overload.  3. Acute kidney failuresuperimposed on chronic kidney disease stage III: Appreciate nephrology's input. Discussed with Dr. Candiss Norse, patient is making urine however, filtering capacity is decreased.  He is exceedingly volume overloaded and ideally should be dialyzed however, he is not tolerating this.  His prognosis in this regard is very poor.  4. Altered mental status/obtundation/ventilator associated discomfort:Will adjust sedatives and analgesics and wean off Versed and Dilaudid. Decrease sedatives attempt to achieve RASSof -1.Toxic metabolic encephalopathy due to multiple comorbid situations coexisting.  5. Fever: He was treated for pneumonia initially.  He has had recurrent fever, last antibiotic had been Zosyn.  This had been discontinued.  Will panculture and start [place].  6.  Atrial fibrillation with rapid ventricular response: He is on amiodarone, required rebolus.  Monitor.  7.  Type 2 diabetes with persistent hyperglycemia: ICU hyperglycemia protocol.  8.  Thrombocytopenia, persistent: HIT antibodies negative.  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    Patient remains DNR.[name] prognosis is exceedingly poor.  We will continue to engage his sister and goals of care discussion.  He has been hospitalized for 21 days without significant progress with worsening slowly day by day.    LOS: 21 days   Additional comments: Discussed during multidisciplinary rounds.  Critical Care Total Time*: 45 Minutes  C. Derrill Kay, MD [facility] 12/07/2018  *Care  during the described time interval was provided by me and/or other providers on the critical care team.  I have reviewed this patient's available data, including medical history, events of note, physical examination and test results as part of my evaluation.

## 2018-12-07 NOTE — Progress Notes (Signed)
Pharmacy Antibiotic Note  Kyle White is a 59 y.o. male admitted on 11/08/2018 with pneumonia.  Pharmacy has been consulted for meropenem dosing.  Plan: Meropenem 1 g IV q12h based on normalized CrCl ~31 ml/min. Unclear how well pt is clearing, may need to adjust dose.  Will need close monitoring of renal function and adjust dose as needed.  Height: 5' 9.02" (175.3 cm) Weight: (!) 500 lb 7.1 oz (227 kg) IBW/kg (Calculated) : 70.74  Temp (24hrs), Avg:100.4 F (38 C), Min:99.7 F (37.6 C), Max:101.9 F (38.8 C)  Recent Labs  Lab 12/03/18 0435 12/04/18 0438 12/04/18 1400 11/24/2018 0413 12/06/18 0503 12/07/18 0505  WBC 8.4 5.1  --  4.3 3.9* 4.1  CREATININE 3.53* 3.38*  --  3.29* 2.92* 2.55*  VANCORANDOM  --   --  19  --   --  14    Estimated Creatinine Clearance: 58.8 mL/min (A) (by C-G formula based on SCr of 2.55 mg/dL (H)).    No Active Allergies    Thank you for allowing pharmacy to be a part of this patient's care.  Rocky Morel 12/07/2018 8:53 PM

## 2018-12-07 NOTE — Consult Note (Signed)
Pharmacy Antibiotic Note  Kyle White is a 58 y.o. male admitted on 11/03/2018 with pneumonia. 9/3 BAL significant for Staph simulans and Strep mitis/oralis. Patient had received approximately 4 days of vancomycin therapy this admission (last dose 9/9). Meropenem was started on 9/9 for persistent fevers, concern for pseudomonas, and head CT significant for sinusitis. Meropenem and vancomycin were d/c 9/10 and ceftriaxone monotherapy was started. Ceftriaxone was then changed to Zosyn on 9/13 after concern for possible aspiration pneumonia. Per discussion on rounds plan is to discontinue Zosyn and re-start vancomycin as duration of therapy was determined to be inadequate. Pharmacy consulted for vancomycin dosing for Staph simulans and Strep mitis/oralis pneumonia. 9/14 CXR significant for stable bibasilar opacities with small pleural effusions. Patient also with acute renal failure secondary to acute cardiorenal syndrome receiving intermittent HD for uremia and fluid overload.    Patient remains intubated and POD #2 from tracheostomy . Patient with pancytopenia and also fluid overloaded. Patient with fever this morning. HD session 9/14 terminated after ~30 minutes due to catheter dysfunction. Patient with new right femoral catheter. Vancomycin initially ordered to be given with dialysis yesterday however patient did not tolerate dialysis and antibiotic was not given.   Today is day 5 of therapy with planned duration for one more day. No apparent plan for dialysis today.   Plan:   9/18 @ 0505 VR 14. Will give vancomycin 1.25g x1 today to complete therapy. Anticipate patient will remain therapeutic for 1-2 more days if no dialysis considering poor renal clearance.   Pharmacy will discontinue consult for vancomycin and sign-off now.   Height: 5' 9.02" (175.3 cm) Weight: (!) 500 lb 7.1 oz (227 kg) IBW/kg (Calculated) : 70.74  Temp (24hrs), Avg:99.9 F (37.7 C), Min:99 F (37.2 C), Max:100.7 F (38.2  C)  Recent Labs  Lab 12/03/18 0435 12/04/18 0438 12/04/18 1400 11/30/2018 0413 12/06/18 0503 12/07/18 0505  WBC 8.4 5.1  --  4.3 3.9* 4.1  CREATININE 3.53* 3.38*  --  3.29* 2.92* 2.55*  VANCORANDOM  --   --  19  --   --  14    Estimated Creatinine Clearance: 58.8 mL/min (A) (by C-G formula based on SCr of 2.55 mg/dL (H)).    No Active Allergies  Antimicrobials this admission: Cefepime 9/3 >> 9/8 Vancomycin 9/7 >> 9/10, 9/14 >>9/18 Meropenem 9/9 >> 9/10 Ceftriaxone 9/11 >> 9/12 Zosyn 9/13 >> 9/14  Dose adjustments this admission:  9/9: Vancomycin decreased from 1750 mg Q24H to 1250 mg Q24H due to serum creatinine bump  9/10: Meropenem decreased from 1g Q12H to 500 mg Q24H due to patient starting intermittent HD. Vancomycin transitioned to Dialysis dosing.   Microbiology results: 9/13 tracheal aspirate: few candida albicans 9/9 respiratory culture: rare GPC in pairs 9/9 respiratory panel: none detected 9/7 Bcx: no growth  9/3 BAL: Staph simulans & Strep mitis/oralis, vancomycin susceptible 8/29 Bcx: No growth 8/28 Ucx: No growth 8/28 MRSA PCR: negative  Thank you for allowing pharmacy to be a part of this patient's care.  [place] Resident 12/07/2018 1:57 PM

## 2018-12-07 NOTE — Progress Notes (Signed)
[facility], [place] 12/07/18  Subjective:   LOS: 21 09/17 0701 - 09/18 0700 In: 7332.5 [I.V.:2272.5; NG/GT:4880] Out: 3293 [Urine:3375; Stool:200] Critically ill Vent assisted. Fio2 50 %  Sedation - fentanyl Continues to have large amount of edema New rt femoral cathter placed   UOP has improved S Cr trending lower, BUN lower but critically elevated Did not tolerated HD yesterday - caused hemodynamic instablity   Objective:  Vital signs in last 24 hours:  Temp:  [98.8 F (37.1 C)-100.2 F (37.9 C)] 99.7 F (37.6 C) (09/18 0800) Pulse Rate:  [34-130] 92 (09/18 0830) Resp:  [15-23] 22 (09/18 0900) BP: (52-132)/(33-107) 113/78 (09/18 0900) SpO2:  [74 %-97 %] 74 % (09/18 0830) FiO2 (%):  [30 %-50 %] 40 % (09/18 0800) Weight:  [227 kg] 227 kg (09/18 0500)  Weight change: 3 kg Filed Weights   12/01/2018 0500 12/06/18 0500 12/07/18 0500  Weight: (!) 220 kg (!) 224 kg (!) 227 kg    Intake/Output:    Intake/Output Summary (Last 24 hours) at 12/07/2018 0947 Last data filed at 12/07/2018 0830 Gross per 24 hour  Intake 7607.8 ml  Output 3293 ml  Net 4314.8 ml     Physical Exam: General: critically ill apearing  HEENT Tracheostomy;  placed 12/16/2018  Pulm/lungs Vent assisted  CVS/Heart Irregular, tachycardic  Abdomen:  Obese, dependent edema  Extremities: SCDs, + dependent edema  Neurologic: sedated  Skin: warm  Access: Rt femoral dialysis cathter in place   Rectal tube, Foley in place    Basic Metabolic Panel:  Recent Labs  Lab 12/03/18 0435 12/04/18 0438 11/25/2018 0413 12/06/18 0503 12/07/18 0505  NA 147* 149* 147* 150* 148*  K 3.5 3.1* 3.5 3.6 3.5  CL 110 111 111 115* 114*  CO2 22 22 24 23 23   GLUCOSE 292* 181* 240* 174* 182*  BUN 148* 144* 141* 123* 118*  CREATININE 3.53* 3.38* 3.29* 2.92* 2.55*  CALCIUM 8.1* 8.0* 7.9* 8.1* 7.9*  MG  --  2.9*  --   --   --   PHOS  --  6.2*  --   --   --      CBC: Recent Labs  Lab  12/03/18 0435 12/04/18 0438 11/23/2018 0413 12/19/2018 1630 12/06/18 0503 12/07/18 0505  WBC 8.4 5.1 4.3  --  3.9* 4.1  NEUTROABS 7.2  --   --   --   --  3.3  HGB 10.6* 9.9* 9.5*  --  9.3* 9.0*  HCT 32.3* 31.4* 30.6*  --  29.9* 28.5*  MCV 93.4 94.6 95.3  --  95.8 95.0  PLT 57* 46* 47* 57* 54* 51*      Lab Results  Component Value Date   HEPBSAG Negative 11/29/2018   HEPBSAB Non Reactive 11/29/2018   HEPBIGM Negative 11/29/2018      Microbiology:  Recent Results (from the past 240 hour(s))  Respiratory Panel by PCR     Status: None   Collection Time: 11/28/18 10:36 AM   Specimen: Nasopharyngeal Swab; Respiratory  Result Value Ref Range Status   Adenovirus NOT DETECTED NOT DETECTED Final   Coronavirus 229E NOT DETECTED NOT DETECTED Final    Comment: (NOTE) The Coronavirus on the Respiratory Panel, DOES NOT test for the novel  Coronavirus (2019 nCoV)    Coronavirus HKU1 NOT DETECTED NOT DETECTED Final   Coronavirus NL63 NOT DETECTED NOT DETECTED Final   Coronavirus OC43 NOT DETECTED NOT DETECTED Final   Metapneumovirus NOT DETECTED NOT DETECTED  Final   Rhinovirus / Enterovirus NOT DETECTED NOT DETECTED Final   Influenza A NOT DETECTED NOT DETECTED Final   Influenza B NOT DETECTED NOT DETECTED Final   Parainfluenza Virus 1 NOT DETECTED NOT DETECTED Final   Parainfluenza Virus 2 NOT DETECTED NOT DETECTED Final   Parainfluenza Virus 3 NOT DETECTED NOT DETECTED Final   Parainfluenza Virus 4 NOT DETECTED NOT DETECTED Final   Respiratory Syncytial Virus NOT DETECTED NOT DETECTED Final   Bordetella pertussis NOT DETECTED NOT DETECTED Final   Chlamydophila pneumoniae NOT DETECTED NOT DETECTED Final   Mycoplasma pneumoniae NOT DETECTED NOT DETECTED Final    Comment: Performed at [facility], [place] [address], [place], [place] [address]  Culture, respiratory (non-expectorated)     Status: None   Collection Time: 11/28/18 11:29 AM   Specimen: Tracheal Aspirate; Respiratory   Result Value Ref Range Status   Specimen Description   Final    TRACHEAL ASPIRATE Performed at [facility], [address] [place], [place] [address]    Special Requests   Final    NONE Performed at [facility], [place] [place], [place] [address]    Gram Stain   Final    FEW WBC PRESENT, PREDOMINANTLY PMN FEW SQUAMOUS EPITHELIAL CELLS PRESENT RARE GRAM POSITIVE COCCI IN PAIRS    Culture   Final    FEW Consistent with normal respiratory flora. Performed at [facility], [place] [address] [place], [place] [address]    Report Status 11/30/2018 FINAL  Final  Culture, respiratory (non-expectorated)     Status: None   Collection Time: 12/02/18  8:45 AM   Specimen: Tracheal Aspirate; Respiratory  Result Value Ref Range Status   Specimen Description   Final    TRACHEAL ASPIRATE Performed at [facility], [address], [place], [place] [address]    Special Requests   Final    NONE Performed at [facility], [place] [place], [place] [address]    Gram Stain   Final    NO WBC SEEN NO ORGANISMS SEEN Performed at [facility], [place] [address], [place],  [address]    Culture FEW CANDIDA ALBICANS  Final   Report Status 12/04/2018 FINAL  Final    Coagulation Studies: Recent Labs    12/11/2018 0413  LABPROT 16.0*  INR 1.3*    Urinalysis: No results for input(s): COLORURINE, LABSPEC, PHURINE, GLUCOSEU, HGBUR, BILIRUBINUR, KETONESUR, PROTEINUR, UROBILINOGEN, NITRITE, LEUKOCYTESUR in the last 72 hours.  Invalid input(s): APPERANCEUR    Imaging: No results found.   Medications:   . sodium chloride Stopped (12/03/18 0527)  . amiodarone 60 mg/hr (12/07/18 0830)  . anticoagulant sodium citrate    . dexmedetomidine (PRECEDEX) IV infusion 0.4 mcg/kg/hr (12/07/18 0830)  . feeding supplement (VITAL HIGH PROTEIN) 45 mL/hr at 12/07/18 0600  . fentaNYL infusion INTRAVENOUS 100 mcg/hr (12/07/18 0830)  .  phenylephrine (NEO-SYNEPHRINE) Adult infusion 30 mcg/min (12/07/18 0830)   . aspirin  81 mg Per NG tube Daily  . chlorhexidine gluconate (MEDLINE KIT)  15 mL Mouth Rinse BID  . Chlorhexidine Gluconate Cloth  6 each Topical Daily  . clonazePAM  1 mg Per Tube Daily  . clonazePAM  2 mg Per Tube QHS  . feeding supplement (PRO-STAT SUGAR FREE 64)  60 mL Per Tube TID  . free water  400 mL Per Tube Q4H  . insulin aspart  0-20 Units Subcutaneous Q4H  . insulin aspart  4 Units Subcutaneous Q4H  . insulin glargine  40 Units Subcutaneous Daily  . ipratropium-albuterol  3 mL Nebulization Q6H  . mouth rinse  15 mL Mouth Rinse 10 times per day  . multivitamin  15 mL Per Tube Daily  . pantoprazole sodium  40 mg Per Tube QHS  . QUEtiapine  25 mg Per Tube QHS  . QUEtiapine  50 mg Per Tube QHS  . senna-docusate  2 tablet Per Tube BID  . sodium chloride flush  10-40 mL Intracatheter Q12H  . vancomycin variable dose per unstable renal function (pharmacist dosing)   Does not apply See admin instructions   sodium chloride, acetaminophen, anticoagulant sodium citrate, bisacodyl, fentaNYL, metoprolol tartrate, midazolam, sodium chloride flush  Assessment/ Plan:  58 y.o. male with with atrial fibrillation on warfarin, congestive heart failure, diabetes mellitus type 2, obstructive sleep apnea,morbid obesity, gout, mitral valve replacementwho was [facility] exacerbation of COPD  Active Problems:   Acute exacerbation of CHF (congestive heart failure) ([place])   Acute respiratory failure with hypoxia and hypercapnia (H[facility])   Goals of care, counseling/discussion   Palliative care by specialist   DNR (do not resuscitate) discussion   COPD exacerbation ([place])   #. ARF on CKD st3 Recent Labs    12/04/18 0438 12/19/2018 0413 12/06/18 0503 12/07/18 0505  CREATININE 3.38* 3.29* 2.92* 2.55*  S Creatinine remains elevated but trending lower BUN is critically high, also trending  lower UOP about 3300 cc - did not tolerate HD 9/17. Will monitor for now  - Avoid nephrotoxins such as NSAIDs, iv contrast, Fleets enema  # Hypokalemia,  - replace prn   # hypernatremia - agree with free water replacement  #. Anemia    Lab Results  Component Value Date   HGB 9.0 (L) 12/07/2018   # Acute resp failure - Vent dependent - Fio2 45%    # generalized Edema - 2 D echo from 8/29 shows LVEF 55-60% (nomral).  - Abnormal severe thickening of aortic wall Tricuspid valve and Rt vent were not assesed - Amiodarone for A Fib   #. Diabetes type 2 with CKD Hgb A1c MFr Bld (%)  Date Value  11/17/2018 7.3 (H)        LOS: [place] 9/18/20209:47 AM  [place], [place]

## 2018-12-07 NOTE — Progress Notes (Signed)
Pharmacy Electrolyte Monitoring Consult:  Pharmacy consulted to assist in monitoring and replacing electrolytes in this 58 y.o. male admitted on 10/21/2018. [name] currently intubated and sedated on dexmedetomidine and fentanyl infusion.   Labs:  Sodium (mmol/L)  Date Value  12/07/2018 148 (H)   Potassium (mmol/L)  Date Value  12/07/2018 3.5   Magnesium (mg/dL)  Date Value  12/04/2018 2.9 (H)   Phosphorus (mg/dL)  Date Value  12/04/2018 6.2 (H)   Calcium (mg/dL)  Date Value  12/07/2018 7.9 (L)   Albumin (g/dL)  Date Value  12/03/2018 1.8 (L)   Corrected Calcium: 9.7  Assessment/Plan: 1. Electrolytes: Sodium improved today but continues to be mildly elevated. Sodium trending up today. Patient unable to tolerate dialysis yesterday.     --Continue water flushes increased to 400 mL q4h    --Defer repletion of electrolytes today    --BMP with AM labs  2. Constipation: Patient with bowel movement last three days. Per nephrology avoid Fleet enemas. Constipating medications include fentanyl infusion. [name] also diabetic.     --Discontinue senna/docusate to 2 tabs BID    --Discontinue Miralax BID  3. Glucose: History of persistent hyperglycemia this admission. [name] now off of insulin drip and transitioned to [place] insulin. Diet includes continuous tube feeds as tolerated. Corticosteroids discontinued 9/11. Glucoses within goal range.    --Continue insulin glargine 40 units daily    --Continue 4 units insulin aspart q4h while on tube feeds    --Continue SSI q4h.   Pharmacy will continue to monitor and adjust per consult.   [place] Resident 12/07/2018 2:09 PM

## 2018-12-08 DIAGNOSIS — E668 Other obesity: Secondary | ICD-10-CM

## 2018-12-08 DIAGNOSIS — E46 Unspecified protein-calorie malnutrition: Secondary | ICD-10-CM

## 2018-12-08 LAB — BASIC METABOLIC PANEL
Anion gap: 11 (ref 5–15)
BUN: 136 mg/dL — ABNORMAL HIGH (ref 6–20)
CO2: 23 mmol/L (ref 22–32)
Calcium: 7.8 mg/dL — ABNORMAL LOW (ref 8.9–10.3)
Chloride: 114 mmol/L — ABNORMAL HIGH (ref 98–111)
Creatinine, Ser: 2.8 mg/dL — ABNORMAL HIGH (ref 0.61–1.24)
GFR calc Af Amer: 27 mL/min — ABNORMAL LOW (ref >60)
GFR calc non Af Amer: 24 mL/min — ABNORMAL LOW (ref >60)
Glucose, Bld: 191 mg/dL — ABNORMAL HIGH (ref 70–99)
Potassium: 3.5 mmol/L (ref 3.5–5.1)
Sodium: 148 mmol/L — ABNORMAL HIGH (ref 135–145)

## 2018-12-08 LAB — CBC WITH DIFFERENTIAL/PLATELET
Abs Immature Granulocytes: 0.02 10*3/uL (ref 0.00–0.07)
Basophils Absolute: 0 10*3/uL (ref 0.0–0.1)
Basophils Relative: 0 %
Eosinophils Absolute: 0.1 10*3/uL (ref 0.0–0.5)
Eosinophils Relative: 2 %
HCT: 26.2 % — ABNORMAL LOW (ref 39.0–52.0)
Hemoglobin: 8.2 g/dL — ABNORMAL LOW (ref 13.0–17.0)
Immature Granulocytes: 1 %
Lymphocytes Relative: 11 %
Lymphs Abs: 0.3 10*3/uL — ABNORMAL LOW (ref 0.7–4.0)
MCH: 30 pg (ref 26.0–34.0)
MCHC: 31.3 g/dL (ref 30.0–36.0)
MCV: 96 fL (ref 80.0–100.0)
Monocytes Absolute: 0.1 10*3/uL (ref 0.1–1.0)
Monocytes Relative: 4 %
Neutro Abs: 2.6 10*3/uL (ref 1.7–7.7)
Neutrophils Relative %: 82 %
Platelets: 45 10*3/uL — ABNORMAL LOW (ref 150–400)
RBC: 2.73 MIL/uL — ABNORMAL LOW (ref 4.22–5.81)
RDW: 14.6 % (ref 11.5–15.5)
Smear Review: NORMAL
WBC: 3.2 10*3/uL — ABNORMAL LOW (ref 4.0–10.5)
nRBC: 0.6 % — ABNORMAL HIGH (ref 0.0–0.2)

## 2018-12-08 LAB — URINE CULTURE: Culture: NO GROWTH

## 2018-12-08 LAB — GLUCOSE, CAPILLARY
Glucose-Capillary: 171 mg/dL — ABNORMAL HIGH (ref 70–99)
Glucose-Capillary: 167 mg/dL — ABNORMAL HIGH (ref 70–99)
Glucose-Capillary: 225 mg/dL — ABNORMAL HIGH (ref 70–99)
Glucose-Capillary: 198 mg/dL — ABNORMAL HIGH (ref 70–99)
Glucose-Capillary: 193 mg/dL — ABNORMAL HIGH (ref 70–99)

## 2018-12-08 LAB — MAGNESIUM: Magnesium: 2.5 mg/dL — ABNORMAL HIGH (ref 1.7–2.4)

## 2018-12-08 LAB — PHOSPHORUS: Phosphorus: 5.9 mg/dL — ABNORMAL HIGH (ref 2.5–4.6)

## 2018-12-08 MED ORDER — SODIUM CHLORIDE 0.9 % IV SOLN
1.0000 g | Freq: Three times a day (TID) | INTRAVENOUS | Status: DC
  Administered 2018-12-08 – 2018-12-11 (×9): 1 g via INTRAVENOUS
  Filled 2018-12-08 (×12): qty 1

## 2018-12-08 MED ORDER — CLONAZEPAM 0.1 MG/ML ORAL SUSPENSION: 0.5000 mg | Freq: Two times a day (BID) | ORAL | Status: DC

## 2018-12-08 MED ORDER — CLONAZEPAM 0.5 MG PO TABS
0.5000 mg | ORAL_TABLET | Freq: Two times a day (BID) | ORAL | Status: DC
  Administered 2018-12-08 – 2018-12-12 (×8): 0.5 mg via ORAL
  Filled 2018-12-08 (×8): qty 1

## 2018-12-08 MED ORDER — FREE WATER
200.0000 mL | Status: DC
  Administered 2018-12-08 – 2018-12-12 (×26): 200 mL

## 2018-12-08 MED ORDER — ALBUMIN HUMAN 25 % IV SOLN
12.5000 g | Freq: Two times a day (BID) | INTRAVENOUS | Status: AC
  Administered 2018-12-08 – 2018-12-10 (×6): 12.5 g via INTRAVENOUS
  Filled 2018-12-08 (×6): qty 50

## 2018-12-08 NOTE — Progress Notes (Signed)
Pharmacy Electrolyte Monitoring Consult:  Pharmacy consulted to assist in monitoring and replacing electrolytes in this 58 y.o. male admitted on 11/15/2018. [name] currently intubated and sedated on dexmedetomidine and fentanyl infusion.   Labs:  Sodium (mmol/L)  Date Value  12/08/2018 148 (H)   Potassium (mmol/L)  Date Value  12/08/2018 3.5   Magnesium (mg/dL)  Date Value  12/08/2018 2.5 (H)   Phosphorus (mg/dL)  Date Value  12/08/2018 5.9 (H)   Calcium (mg/dL)  Date Value  12/08/2018 7.8 (L)   Albumin (g/dL)  Date Value  12/07/2018 1.5 (L)   Corrected Calcium: 9.7  Assessment/Plan: 1. Electrolytes: Sodium stable today but continues to be elevated.  Patient unable to tolerate last session of dialysis. [name] fluid overloaded. Continue to follow dialysis plan.     --Free water flushes decreased to 200 mL q4h    --Defer repletion of electrolytes today    --BMP with AM labs  2. Constipation: Patient with bowel movement last four days. Per nephrology avoid Fleet enemas. Constipating medications include fentanyl infusion. [name] also diabetic. Bowel regimen discontinued due to regular bowel movements. Continue to monitor for further constipation.  3. Glucose: History of persistent hyperglycemia this admission. [name] now off of insulin drip and transitioned to [place] insulin. Diet includes continuous tube feeds as tolerated. Corticosteroids discontinued 9/11. Glucoses within goal range.    --Continue insulin glargine 40 units daily    --Continue 4 units insulin aspart q4h while on tube feeds    --Continue SSI q4h.   Pharmacy will continue to monitor and adjust per consult.   [place] Resident 12/08/2018 7:54 AM

## 2018-12-08 NOTE — Progress Notes (Signed)
Follow up - Critical Care Medicine Note  Patient Details:    Kyle White is an 58 y.o. male with hx of type 2 diabetes, chronic AF, pickwickian syndrome, hypertension, congestive heart failure adm 8/28 via ED to ICU/SDU with acute on chronic respiratory failure felt to be due to CHF/pulmonary edema.  Has required intubation and mechanical ventilation and because of failure to be liberated from the vent now has tracheostomy in place.  Lines, Airways, Drains: CVC Triple Lumen 11/22/18 Right Internal jugular (Active)  Indication for Insertion or Continuance of Line Poor Vasculature-patient has had multiple peripheral attempts or PIVs lasting less than 24 hours 12/07/18 0750  Site Assessment Clean;Dry;Intact 12/07/18 0750  Proximal Lumen Status Infusing;Flushed;Blood return noted 12/07/18 0750  Medial Lumen Status Infusing;Flushed;Blood return noted 12/07/18 0750  Distal Lumen Status Infusing;Flushed;Blood return noted 12/07/18 0750  Dressing Type Transparent 12/07/18 0750  Dressing Status Clean;Dry;Intact;Antimicrobial disc in place 12/07/18 [name] checked and tightened 12/07/18 0750  Dressing Intervention New dressing;Dressing changed;Antimicrobial disc changed 12/06/18 0300  Dressing Change Due 12/13/18 12/07/18 0750     NG/OG Tube Nasogastric Right nare  Documented cm marking at nare/ corner of mouth (Active)  Cm Marking at Nare/Corner of Mouth (if applicable) 80 cm 79/02/40 1600  Site Assessment Clean;Dry;Intact 12/07/18 1600  Ongoing Placement Verification No change in respiratory status;No acute changes, not attributed to clinical condition;Xray 12/07/18 1600  Status Infusing tube feed 12/07/18 1600  Amount of suction 15 mmHg 12/06/18 1200  Drainage Appearance None 12/06/18 0758  Intake (mL) 200 mL 12/06/18 2200     Rectal Tube/Pouch (Active)  Date Prophylactic Dressing Applied (if applicable) 97/35/32 99/24/26 1600  Output (mL) 200 mL 12/06/18 1828  Intake  (mL) 50 mL 12/07/18 0800     Urethral Catheter [name], RN Double-lumen 16 Fr. (Active)  Indication for Insertion or Continuance of Catheter Therapy based on hourly urine output monitoring and documentation for critical condition (NOT STRICT I&O) 12/07/18 0400  Site Assessment Intact;Clean;Dry 12/07/18 0751  Catheter Maintenance Bag below level of bladder;Catheter secured;Drainage bag/tubing not touching floor;Insertion date on drainage bag;No dependent loops;Seal intact;Bag emptied prior to transport 12/07/18 0751  Collection Container Standard drainage bag 12/07/18 0751  Securement Method Securing device (Describe) 12/07/18 0751  Urinary Catheter Interventions (if applicable) Unclamped 83/41/96 0751  Input (mL) 0 mL 12/02/18 2000  Output (mL) 188 mL 12/07/18 1550    Anti-infectives:  Anti-infectives (From admission, onward)   Start     Dose/Rate Route Frequency Ordered Stop   12/08/18 0600  meropenem (MERREM) 1 g in sodium chloride 0.9 % 100 mL IVPB     1 g 200 mL/hr over 30 Minutes Intravenous Every 12 hours 12/07/18 2059     12/07/18 1800  meropenem (MERREM) 1 g in sodium chloride 0.9 % 100 mL IVPB  Status:  Discontinued     1 g 200 mL/hr over 30 Minutes Intravenous Every 8 hours 12/07/18 1716 12/07/18 2059   12/07/18 1715  vancomycin (VANCOCIN) IVPB 750 mg/150 ml premix  Status:  Discontinued     750 mg 150 mL/hr over 60 Minutes Intravenous  Once 12/07/18 1706 12/07/18 1721   12/07/18 1500  vancomycin (VANCOCIN) 1,250 mg in sodium chloride 0.9 % 250 mL IVPB     1,250 mg 166.7 mL/hr over 90 Minutes Intravenous  Once 12/07/18 1403 12/08/18 0934   12/06/18 1400  vancomycin (VANCOCIN) 1,250 mg in sodium chloride 0.9 % 250 mL IVPB  Status:  Discontinued  1,250 mg 166.7 mL/hr over 90 Minutes Intravenous  Once 12/06/18 1240 12/06/18 1420   12/01/2018 1500  vancomycin (VANCOCIN) 1,250 mg in sodium chloride 0.9 % 250 mL IVPB     1,250 mg 166.7 mL/hr over 90 Minutes Intravenous   Once 11/21/2018 1411 11/22/2018 1640   12/03/18 1513  vancomycin variable dose per unstable renal function (pharmacist dosing)  Status:  Discontinued      Does not apply See admin instructions 12/03/18 1514 12/07/18 1527   12/03/18 1015  vancomycin (VANCOCIN) 2,000 mg in sodium chloride 0.9 % 500 mL IVPB     2,000 mg 250 mL/hr over 120 Minutes Intravenous  Once 12/03/18 1009 12/03/18 1346   12/02/18 0945  piperacillin-tazobactam (ZOSYN) IVPB 3.375 g  Status:  Discontinued     3.375 g 12.5 mL/hr over 240 Minutes Intravenous Every 8 hours 12/02/18 0938 12/03/18 1009   11/30/18 1800  meropenem (MERREM) 500 mg in sodium chloride 0.9 % 100 mL IVPB  Status:  Discontinued     500 mg 200 mL/hr over 30 Minutes Intravenous Daily-1800 11/29/18 1500 11/30/18 0901   11/30/18 1000  cefTRIAXone (ROCEPHIN) 2 g in sodium chloride 0.9 % 100 mL IVPB  Status:  Discontinued     2 g 200 mL/hr over 30 Minutes Intravenous Every 24 hours 11/30/18 0852 12/02/18 0934   11/28/18 1530  vancomycin (VANCOCIN) 1,250 mg in sodium chloride 0.9 % 250 mL IVPB  Status:  Discontinued     1,250 mg 166.7 mL/hr over 90 Minutes Intravenous Every 24 hours 11/28/18 1510 11/29/18 1553   11/28/18 1500  vancomycin (VANCOCIN) 1,250 mg in sodium chloride 0.9 % 250 mL IVPB  Status:  Discontinued     1,250 mg 166.7 mL/hr over 90 Minutes Intravenous Every 24 hours 11/28/18 1458 11/28/18 1510   11/28/18 1215  meropenem (MERREM) 1 g in sodium chloride 0.9 % 100 mL IVPB  Status:  Discontinued     1 g 200 mL/hr over 30 Minutes Intravenous Every 12 hours 11/28/18 1206 11/29/18 1500   11/27/18 1500  vancomycin (VANCOCIN) 1,750 mg in sodium chloride 0.9 % 500 mL IVPB  Status:  Discontinued     1,750 mg 250 mL/hr over 120 Minutes Intravenous Every 24 hours 11/27/18 1355 11/28/18 0726   11/26/18 1100  vancomycin (VANCOCIN) 2,500 mg in sodium chloride 0.9 % 500 mL IVPB     2,500 mg 250 mL/hr over 120 Minutes Intravenous  Once 11/26/18 1040 11/26/18  1315   11/26/18 1058  vancomycin variable dose per unstable renal function (pharmacist dosing)  Status:  Discontinued      Does not apply See admin instructions 11/26/18 1058 11/27/18 1658   11/22/18 1015  ceFEPIme (MAXIPIME) 2 g in sodium chloride 0.9 % 100 mL IVPB  Status:  Discontinued     2 g 200 mL/hr over 30 Minutes Intravenous Every 8 hours 11/22/18 1009 11/27/18 1027      Microbiology: Results for orders placed or performed during the hospital encounter of 10/23/2018  SARS CORONAVIRUS 2 (TAT 6-12 HRS) Nasal Swab Aptima Multi Swab     Status: None   Collection Time: 10/22/2018  4:55 PM   Specimen: Aptima Multi Swab; Nasal Swab  Result Value Ref Range Status   SARS Coronavirus 2 NEGATIVE NEGATIVE Final    Comment: (NOTE) SARS-CoV-2 target nucleic acids are NOT DETECTED. The SARS-CoV-2 RNA is generally detectable in upper and lower respiratory specimens during the acute phase of infection. Negative results do not preclude SARS-CoV-2  infection, do not rule out co-infections with other pathogens, and should not be used as the sole basis for treatment or other patient management decisions. Negative results must be combined with clinical observations, patient history, and epidemiological information. The expected result is Negative. Fact Sheet for Patients: SugarRoll.be Fact Sheet for Healthcare Providers: https://www.woods-mathews.com/ This test is not yet approved or cleared by the [place] FDA and  has been authorized for detection and/or diagnosis of SARS-CoV-2 by FDA under an Emergency Use Authorization (EUA). This EUA will remain  in effect (meaning this test can be used) for the duration of the COVID-19 declaration under Section 56 4(b)(1) of the Act, 21 U.S.C. section 360bbb-3(b)(1), unless the authorization is terminated or revoked sooner. Performed at [facility], [place] [address] [place], [place] [address]   SARS  Coronavirus 2 [facility] order, Performed in [facility] hospital lab) Nasopharyngeal Nasopharyngeal Swab     Status: None   Collection Time: 11/17/2018 10:34 PM   Specimen: Nasopharyngeal Swab  Result Value Ref Range Status   SARS Coronavirus 2 NEGATIVE NEGATIVE Final    Comment: (NOTE) If result is NEGATIVE SARS-CoV-2 target nucleic acids are NOT DETECTED. The SARS-CoV-2 RNA is generally detectable in upper and lower  respiratory specimens during the acute phase of infection. The lowest  concentration of SARS-CoV-2 viral copies this assay can detect is 250  copies / mL. A negative result does not preclude SARS-CoV-2 infection  and should not be used as the sole basis for treatment or other  patient management decisions.  A negative result may occur with  improper specimen collection / handling, submission of specimen other  than nasopharyngeal swab, presence of viral mutation(s) within the  areas targeted by this assay, and inadequate number of viral copies  (<250 copies / mL). A negative result must be combined with clinical  observations, patient history, and epidemiological information. If result is POSITIVE SARS-CoV-2 target nucleic acids are DETECTED. The SARS-CoV-2 RNA is generally detectable in upper and lower  respiratory specimens dur ing the acute phase of infection.  Positive  results are indicative of active infection with SARS-CoV-2.  Clinical  correlation with patient history and other diagnostic information is  necessary to determine patient infection status.  Positive results do  not rule out bacterial infection or co-infection with other viruses. If result is PRESUMPTIVE POSTIVE SARS-CoV-2 nucleic acids MAY BE PRESENT.   A presumptive positive result was obtained on the submitted specimen  and confirmed on repeat testing.  While 2019 novel coronavirus  (SARS-CoV-2) nucleic acids may be present in the submitted sample  additional confirmatory testing may be necessary  for epidemiological  and / or clinical management purposes  to differentiate between  SARS-CoV-2 and other Sarbecovirus currently known to infect humans.  If clinically indicated additional testing with an alternate test  methodology [phone]) is advised. The SARS-CoV-2 RNA is generally  detectable in upper and lower respiratory sp ecimens during the acute  phase of infection. The expected result is Negative. Fact Sheet for Patients:  StrictlyIdeas.no Fact Sheet for Healthcare Providers: BankingDealers.co.za This test is not yet approved or cleared by the [place] FDA and has been authorized for detection and/or diagnosis of SARS-CoV-2 by FDA under an Emergency Use Authorization (EUA).  This EUA will remain in effect (meaning this test can be used) for the duration of the COVID-19 declaration under Section 564(b)(1) of the Act, 21 U.S.C. section 360bbb-3(b)(1), unless the authorization is terminated or revoked sooner. Performed at  [facility], [address], [place], [place] [address]   Culture, respiratory (non-expectorated)     Status: None   Collection Time: 11/02/2018 10:34 PM   Specimen: Tracheal Aspirate; Respiratory  Result Value Ref Range Status   Specimen Description   Final    TRACHEAL ASPIRATE Performed at [facility], [address] [place], [place] [address]    Special Requests   Final    NONE Performed at [facility], [address] [place], [place] [address]    Gram Stain   Final    RARE WBC PRESENT, PREDOMINANTLY PMN ABUNDANT GRAM POSITIVE COCCI IN CHAINS    Culture   Final    Consistent with normal respiratory flora. Performed at [facility], [place] [address] [place], [place] [address]    Report Status 11/19/2018 FINAL  Final  Urine Culture     Status: None   Collection Time: 10/22/2018 11:12 PM   Specimen: Urine, Random  Result Value Ref Range Status   Specimen  Description   Final    URINE, RANDOM Performed at [facility], [address], [place], [place] [address]    Special Requests   Final    NONE Performed at [facility], [address], [place], [place] [address]    Culture   Final    NO GROWTH Performed at [facility], [place] [address], [place], [place] [address]    Report Status 11/18/2018 FINAL  Final  MRSA PCR Screening     Status: None   Collection Time: 11/11/2018 11:28 PM   Specimen: Nasopharyngeal  Result Value Ref Range Status   MRSA by PCR NEGATIVE NEGATIVE Final    Comment:        The GeneXpert MRSA Assay (FDA approved for NASAL specimens only), is one component of a comprehensive MRSA colonization surveillance program. It is not intended to diagnose MRSA infection nor to guide or monitor treatment for MRSA infections. Performed at [facility], [address] [place], [place] [address]   Culture, blood (Routine X 2) w Reflex to ID Panel     Status: None   Collection Time: 11/17/18 12:19 AM   Specimen: BLOOD  Result Value Ref Range Status   Specimen Description BLOOD RIGHT ANTECUBITAL  Final   Special Requests   Final    BOTTLES DRAWN AEROBIC AND ANAEROBIC Blood Culture adequate volume   Culture   Final    NO GROWTH 5 DAYS Performed at [facility], [address], [place], [place] [address]    Report Status 11/22/2018 FINAL  Final  Culture, blood (Routine X 2) w Reflex to ID Panel     Status: None   Collection Time: 11/17/18 12:31 AM   Specimen: BLOOD  Result Value Ref Range Status   Specimen Description BLOOD CENTRAL LINE MIDLINE  Final   Special Requests   Final    BOTTLES DRAWN AEROBIC AND ANAEROBIC Blood Culture adequate volume   Culture   Final    NO GROWTH 5 DAYS Performed at [facility], [address] [place], [place] [address]    Report Status 11/22/2018 FINAL  Final  Culture, bal-quantitative     Status: Abnormal   Collection Time:  11/22/18  7:48 AM   Specimen: Bronchoalveolar Lavage; Respiratory  Result Value Ref Range Status   Specimen Description   Final    BRONCHIAL ALVEOLAR LAVAGE Performed at [facility], [place], [place]  [place]    Special Requests   Final    Immunocompromised Performed at [facility], [place], [place] [address]    Gram Stain   Final    FEW WBC PRESENT, PREDOMINANTLY PMN FEW GRAM POSITIVE COCCI IN PAIRS IN CLUSTERS FEW GRAM POSITIVE RODS Performed at [facility], [place] [address], [place], [place] [address]    Culture (A)  Final    >=100,000 COLONIES/mL STAPHYLOCOCCUS SIMULANS >=100,000 COLONIES/mL STREPTOCOCCUS MITIS/ORALIS    Report Status 11/25/2018 FINAL  Final   Organism ID, Bacteria STAPHYLOCOCCUS SIMULANS (A)  Final   Organism ID, Bacteria STREPTOCOCCUS MITIS/ORALIS (A)  Final      Susceptibility   Staphylococcus simulans - MIC*    CIPROFLOXACIN <=0.5 SENSITIVE Sensitive     ERYTHROMYCIN <=0.25 SENSITIVE Sensitive     GENTAMICIN <=0.5 SENSITIVE Sensitive     OXACILLIN <=0.25 SENSITIVE Sensitive     TETRACYCLINE >=16 RESISTANT Resistant     VANCOMYCIN <=0.5 SENSITIVE Sensitive     TRIMETH/SULFA <=10 SENSITIVE Sensitive     CLINDAMYCIN <=0.25 SENSITIVE Sensitive     RIFAMPIN <=0.5 SENSITIVE Sensitive     Inducible Clindamycin NEGATIVE Sensitive     * >=100,000 COLONIES/mL STAPHYLOCOCCUS SIMULANS   Streptococcus mitis/oralis - MIC*    TETRACYCLINE >=16 RESISTANT Resistant     VANCOMYCIN 0.25 SENSITIVE Sensitive     CLINDAMYCIN 0.5 INTERMEDIATE Intermediate     * >=100,000 COLONIES/mL STREPTOCOCCUS MITIS/ORALIS  CULTURE, BLOOD (ROUTINE X 2) w Reflex to ID Panel     Status: None   Collection Time: 11/26/18 10:32 PM   Specimen: BLOOD  Result Value Ref Range Status   Specimen Description BLOOD RIGHT ANTECUBITAL  Final   Special Requests   Final    BOTTLES DRAWN AEROBIC AND ANAEROBIC Blood Culture adequate volume    Culture   Final    NO GROWTH 5 DAYS Performed at [facility], [address] [place], [place] [address]    Report Status 12/01/2018 FINAL  Final  CULTURE, BLOOD (ROUTINE X 2) w Reflex to ID Panel     Status: None   Collection Time: 11/26/18 10:32 PM   Specimen: BLOOD  Result Value Ref Range Status   Specimen Description BLOOD BLOOD RIGHT HAND  Final   Special Requests   Final    BOTTLES DRAWN AEROBIC AND ANAEROBIC Blood Culture adequate volume   Culture   Final    NO GROWTH 5 DAYS Performed at [facility], [address] [place], [place] [address]    Report Status 12/01/2018 FINAL  Final  Respiratory Panel by PCR     Status: None   Collection Time: 11/28/18 10:36 AM   Specimen: Nasopharyngeal Swab; Respiratory  Result Value Ref Range Status   Adenovirus NOT DETECTED NOT DETECTED Final   Coronavirus 229E NOT DETECTED NOT DETECTED Final    Comment: (NOTE) The Coronavirus on the Respiratory Panel, DOES NOT test for the novel  Coronavirus (2019 nCoV)    Coronavirus HKU1 NOT DETECTED NOT DETECTED Final   Coronavirus NL63 NOT DETECTED NOT DETECTED Final   Coronavirus OC43 NOT DETECTED NOT DETECTED Final   Metapneumovirus NOT DETECTED NOT DETECTED Final   Rhinovirus / Enterovirus NOT DETECTED NOT DETECTED Final   Influenza A NOT DETECTED NOT DETECTED Final   Influenza B NOT DETECTED NOT DETECTED Final   Parainfluenza Virus 1 NOT DETECTED NOT DETECTED Final   Parainfluenza Virus 2 NOT DETECTED NOT DETECTED Final   Parainfluenza  Virus 3 NOT DETECTED NOT DETECTED Final   Parainfluenza Virus 4 NOT DETECTED NOT DETECTED Final   Respiratory Syncytial Virus NOT DETECTED NOT DETECTED Final   Bordetella pertussis NOT DETECTED NOT DETECTED Final   Chlamydophila pneumoniae NOT DETECTED NOT DETECTED Final   Mycoplasma pneumoniae NOT DETECTED NOT DETECTED Final    Comment: Performed at [facility], [place] [address] [place], [place] [address]  Culture,  respiratory (non-expectorated)     Status: None   Collection Time: 11/28/18 11:29 AM   Specimen: Tracheal Aspirate; Respiratory  Result Value Ref Range Status   Specimen Description   Final    TRACHEAL ASPIRATE Performed at [facility], [address] [place], [place] [address]    Special Requests   Final    NONE Performed at [facility], [address] [place], [place] [address]    Gram Stain   Final    FEW WBC PRESENT, PREDOMINANTLY PMN FEW SQUAMOUS EPITHELIAL CELLS PRESENT RARE GRAM POSITIVE COCCI IN PAIRS    Culture   Final    FEW Consistent with normal respiratory flora. Performed at [facility], [place] [address] [place], [place] [address]    Report Status 11/30/2018 FINAL  Final  Culture, respiratory (non-expectorated)     Status: None   Collection Time: 12/02/18  8:45 AM   Specimen: Tracheal Aspirate; Respiratory  Result Value Ref Range Status   Specimen Description   Final    TRACHEAL ASPIRATE Performed at [facility], [address], [place], [place] [address]    Special Requests   Final    NONE Performed at [facility], [address] [place], [place] [address]    Gram Stain   Final    NO WBC SEEN NO ORGANISMS SEEN Performed at [facility], [place] [address], [place], [place] [address]    Culture FEW CANDIDA ALBICANS  Final   Report Status 12/04/2018 FINAL  Final  Urine Culture     Status: None   Collection Time: 12/07/18  3:16 PM   Specimen: Urine, Random  Result Value Ref Range Status   Specimen Description   Final    URINE, RANDOM Performed at [facility], [address] [place], [place] [address]    Special Requests   Final    NONE Performed at [facility], [address] [place], [place] [address]    Culture   Final    NO GROWTH Performed at [facility], [place] [address] [place], [place] [address]    Report Status 12/08/2018 FINAL  Final  Culture, respiratory  (non-expectorated)     Status: None (Preliminary result)   Collection Time: 12/07/18  3:24 PM   Specimen: Tracheal Aspirate; Respiratory  Result Value Ref Range Status   Specimen Description   Final    TRACHEAL ASPIRATE Performed at [facility], [address] [place], [place] [address]    Special Requests   Final    NONE Performed at [facility], [address] [place], [place] [address]    Gram Stain   Final    FEW WBC PRESENT, PREDOMINANTLY PMN RARE YEAST RARE GRAM POSITIVE COCCI IN CLUSTERS    Culture   Final    CULTURE REINCUBATED FOR BETTER GROWTH Performed at [facility], [place] [address], [place], [place] [address]    Report Status PENDING  Incomplete  CULTURE, BLOOD (ROUTINE X 2) w Reflex to ID  Panel     Status: None (Preliminary result)   Collection Time: 12/07/18  3:27 PM   Specimen: BLOOD  Result Value Ref Range Status   Specimen Description BLOOD PORT  Final   Special Requests   Final    BOTTLES DRAWN AEROBIC AND ANAEROBIC Blood Culture adequate volume   Culture   Final    NO GROWTH < 24 HOURS Performed at [facility], [address], [place], [place] [address]    Report Status PENDING  Incomplete  CULTURE, BLOOD (ROUTINE X 2) w Reflex to ID Panel     Status: None (Preliminary result)   Collection Time: 12/07/18  3:30 PM   Specimen: BLOOD  Result Value Ref Range Status   Specimen Description BLOOD RT HAND  Final   Special Requests   Final    BOTTLES DRAWN AEROBIC AND ANAEROBIC Blood Culture adequate volume   Culture   Final    NO GROWTH < 24 HOURS Performed at [facility], [address] [place], [place] [address]    Report Status PENDING  Incomplete    Best Practice/Protocols:  VTE Prophylaxis: Mechanical GI Prophylaxis: Proton Pump Inhibitor  Intermittent Sedation Hyperglycemia (ICU)  Events: 08/28 admitted to ICU/SDU for BiPAP. PCCM consultation 08/28 emergently intubated 08/29 Echocardiogram:  EF 55-60%. LV cavity mildly dilated. Diastolic function could not be evaluated. Left atrium mildly dilated. RV not assessed. RA size not well visualized. 08/29 CTAP: No acute intra-abdominal process noted. No perforation. Trace ascites. Severe hepatic steatosis. Cholelithiasis. Small R and trace L pleural effusions. Bilateral lower lobe airspace disease may represent atelectasis but pneumonia is difficult to exclude, particularly on the R. 08/30 Extubated 08/30 Cardiology consultation: recommended diltiazem gtt for rapid AF and continue anticoagulation 08/31 reintubated emergently 08/31 bronchoscopy for mucous plugging and complete collapse of R lung 08/31 Palliative Care consultation to address goals of care 09/03 DNR established 09/05 Nephrology consultation for AKI 09/06 Renal [id number] appearance of the kidneys 09/06 requiring 100% FiO2 and PEEP 15 cm H2O 09/07 ETT cuff leak, ETT changed 09/08 CTH: No acute intracranial abnormality seen 09/10 HD cath placed and intermittent HD initiated 09/11 + F/C. Improving vent requirements. 2 hrs of HD performed 09/12 improving vent requirements. Low-dose clonazepam and Duragesic patch initiated. Goals of care discussion with patient's sister, consent for tracheostomy tube. 09/13 Severe desaturation with increased purulent respiratory secretions and volume loss due to mucus plugging.Respiratory culture obtained. Antibiotics expanded to piperacillin-tazobactam. DNR confirmed with patient's sister. Continuous sedation infusions initiated 9/14 ETT dislodged, s/p bronch to adjust ETT, extubated and re-intubated Patient has tracheomegaly, plan for trach 9/15 remains on high fio2 9/16 s/p trach 9/17 did not tolerate dialysis due to hemodynamic instability.  Persistent shock physiology on pressors 9/18 re-spiked temperatures, culture sputum, Merrem restarted  Studies: Ct Abdomen Pelvis Wo Contrast  Result Date: 11/17/2018 CLINICAL DATA:   Abdominal rigidity. Concern for perforated viscus. Currently intubated. EXAM: CT ABDOMEN AND PELVIS WITHOUT CONTRAST TECHNIQUE: Multidetector CT imaging of the abdomen and pelvis was performed following the standard protocol without IV contrast. COMPARISON:  None. FINDINGS: Lower chest: Small right and trace left pleural effusions with bilateral lower lobe airspace disease. Small amount of herniated right middle lobe between the fifth and sixth ribs. Hepatobiliary: Severe hepatic steatosis. Several small gallstones. No gallbladder wall thickening or biliary dilatation. Pancreas: Unremarkable. No pancreatic ductal dilatation or surrounding inflammatory changes. Spleen: Normal in size without focal abnormality. Adrenals/Urinary Tract: Adrenal glands are unremarkable. Punctate calculus in the lower pole  the left kidney. No hydronephrosis. Bladder is decompressed by Foley catheter. Stomach/Bowel: Enteric tube tip in the distal stomach. Stomach is within normal limits. Appendix appears normal. No evidence of bowel wall thickening, distention, or inflammatory changes. Vascular/Lymphatic: Aortic atherosclerosis. No enlarged abdominal or pelvic lymph nodes. Reproductive: Prostate is unremarkable. Other: Trace ascites.  No pneumoperitoneum. Musculoskeletal: No acute or significant osseous findings. IMPRESSION: 1.  No acute intra-abdominal process.  No perforation. 2. Trace ascites. 3. Severe hepatic steatosis. 4. Cholelithiasis. 5. Punctate nonobstructive left nephrolithiasis. 6. Small right and trace left pleural effusions. Bilateral lower lobe airspace disease may represent atelectasis, but pneumonia is difficult to exclude, particularly on the right. Electronically Signed   By: Titus Dubin M.D.   On: 11/17/2018 13:50   Dg Abd 1 View  Result Date: 12/03/2018 CLINICAL DATA:  Check gastric catheter placement EXAM: ABDOMEN - 1 VIEW COMPARISON:  Film from earlier in the same day. FINDINGS: Gastric catheter has been  advanced several cm and now lies with the tip in the distal stomach. IMPRESSION: Gastric catheter within the stomach. Electronically Signed   By: Inez Catalina M.D.   On: 12/03/2018 20:50   Dg Abd 1 View  Result Date: 12/03/2018 CLINICAL DATA:  Check gastric catheter placement EXAM: ABDOMEN - 1 VIEW COMPARISON:  11/30/2018 FINDINGS: Gastric catheter is noted within the distal esophagus but does not appear to have reached the gastric lumen. Scattered large and small bowel gas is noted. IMPRESSION: Gastric catheter within the distal esophagus. This should be advanced several cm. Electronically Signed   By: Inez Catalina M.D.   On: 12/03/2018 20:50   Dg Abd 1 View  Result Date: 11/30/2018 CLINICAL DATA:  OG tube placement. EXAM: ABDOMEN - 1 VIEW COMPARISON:  11/26/2018 FINDINGS: 1329 hours. Lower left chest and upper left abdomen have been included in the field of view. NG tube tip is identified in the mid stomach. Cardiopericardial silhouette appears enlarged with left base retrocardiac collapse/consolidation. IMPRESSION: OG tube tip is in the mid stomach. Electronically Signed   By: Misty Stanley M.D.   On: 11/30/2018 15:21   Dg Abd 1 View  Result Date: 11/26/2018 CLINICAL DATA:  ET and NG tube placement. CXR approved by MD at bedside. MD states no repeat necessary. Best attainable images due to patient conditions. EXAM: ABDOMEN - 1 VIEW COMPARISON:  Abdominal radiograph 11/19/2018 FINDINGS: The nasogastric tube distal aspect is coiled overlying the stomach. Paucity of bowel gas. No supine evidence for free intraperitoneal air. Left retrocardiac opacity and probable small effusion. Probable right basilar atelectasis. IMPRESSION: The nasogastric tube side port projects over the stomach. Electronically Signed   By: Audie Pinto M.D.   On: 11/26/2018 16:48   Dg Abd 1 View  Result Date: 11/17/2018 CLINICAL DATA:  Abdomen distension tube placement EXAM: ABDOMEN - 1 VIEW COMPARISON:  11/12/2018 FINDINGS:  Esophageal tube tip visible to the distal stomach but incompletely included. Airspace disease at the right base. IMPRESSION: Esophageal tubing visible to the gastric body but tip is incompletely included in the field of view. Electronically Signed   By: Donavan Foil M.D.   On: 11/17/2018 00:10   Dg Abd 1 View  Result Date: 11/06/2018 CLINICAL DATA:  Abdominal distention EXAM: ABDOMEN - 1 VIEW COMPARISON:  None. FINDINGS: Limited study due to portable nature of the study and body habitus. I see no significant bowel dilatation or free air. Study otherwise limited. IMPRESSION: Very limited study. No visible changes of bowel obstruction or free air.  Electronically Signed   By: Rolm Baptise M.D.   On: 11/19/2018 22:33   Ct Head Wo Contrast  Result Date: 11/27/2018 CLINICAL DATA:  Altered level of consciousness. EXAM: CT HEAD WITHOUT CONTRAST TECHNIQUE: Contiguous axial images were obtained from the base of the skull through the vertex without intravenous contrast. COMPARISON:  None. FINDINGS: Brain: No evidence of acute infarction, hemorrhage, hydrocephalus, extra-axial collection or mass lesion/mass effect. Vascular: No hyperdense vessel or unexpected calcification. Skull: Normal. Negative for fracture or focal lesion. Sinuses/Orbits: Fluid is noted in bilateral mastoid air cells. Bilateral ethmoid and sphenoid sinusitis is noted. Other: None. IMPRESSION: Bilateral ethmoid and sphenoid sinusitis. No acute intracranial abnormality seen. Electronically Signed   By: Marijo Conception M.D.   On: 11/27/2018 15:50   US Renal  Result Date: 11/25/2018 CLINICAL DATA:  Acute renal failure. EXAM: RENAL / URINARY TRACT ULTRASOUND COMPLETE COMPARISON:  Body CT November 17, 2018 FINDINGS: Right Kidney: Renal measurements: 12.2 x 6.9 x 7.5 cm = volume: 334 mL . Echogenicity within normal limits. No mass or hydronephrosis visualized. Left Kidney: Renal measurements: 14.9 x 6.5 x 6.7 cm = volume: 341 mL. Echogenicity within  normal limits. No mass or hydronephrosis visualized. Bladder: Decompressed around urinary Foley and therefore not well evaluated. IMPRESSION: Normal appearance of the kidneys, accounting for the technical limitations of the study caused by body habitus. Electronically Signed   By: Fidela Salisbury M.D.   On: 11/25/2018 15:43   Dg Chest Port 1 View  Result Date: 12/07/2018 CLINICAL DATA:  Respiratory failure EXAM: PORTABLE CHEST 1 VIEW COMPARISON:  December 03, 2018 FINDINGS: Limited due to patient position. Endotracheal tube tip is seen 4 cm above the carina. NG tube is seen coursing below the diaphragm. A right-sided central venous catheter seen at the superior cavoatrial junction. Hazy ground-glass and interstitial opacities seen throughout both lungs. There is interval improvement in the right lung aeration. Again noted is cardiomegaly and a probable small bilateral pleural effusions. IMPRESSION: 1. Support lines and tubes in satisfactory position. 2. Interval improvement in the right lung aeration. 3. Diffuse hazy ground-glass and interstitial opacities , which could be due to infection/atelectasis. 4. Small bilateral pleural effusions Electronically Signed   By: Prudencio Pair M.D.   On: 12/07/2018 15:23   Dg Chest Port 1 View  Result Date: 12/03/2018 CLINICAL DATA:  Check endotracheal tube placement EXAM: PORTABLE CHEST 1 VIEW COMPARISON:  Film from earlier in the same day. FINDINGS: Endotracheal tube has been advanced and now lies approximately 2.8 cm above the carina. Gastric catheter is again seen although the tip of the catheter is not appreciated on this exam. Bilateral jugular central lines are again seen and stable. Increasing opacification in the right hemithorax is noted likely related to a combination of consolidation and effusion. The left lung is clear with the exception of vascular congestion. IMPRESSION: Tubes and lines as described. Persistent vascular congestion. Increasing right  hemithorax opacification consistent with a combination of consolidation and effusion. Electronically Signed   By: Inez Catalina M.D.   On: 12/03/2018 20:49   Dg Chest Port 1 View  Result Date: 12/03/2018 CLINICAL DATA:  Endotracheal tube adjustment EXAM: PORTABLE CHEST 1 VIEW COMPARISON:  Chest radiograph from earlier today. FINDINGS: Endotracheal tube tip is 7.0 cm above the carina, at the level of thoracic inlet. Enteric tube enters stomach with the tip not seen on this image. Left internal jugular central venous catheter terminates in the left brachiocephalic vein near the midline. Right  internal jugular central venous catheter terminates in the middle third of the SVC. Stable cardiomediastinal silhouette with moderate cardiomegaly. No pneumothorax. Stable small bilateral pleural effusions, noting portion of the left costophrenic angle not included on this image. Moderate pulmonary edema appears similar. Bibasilar opacity, favor atelectasis, unchanged. IMPRESSION: 1. Endotracheal tube tip 7.0 cm above the carina, located at the level of the thoracic inlet. Additional support structures as detailed. No pneumothorax. 2. Stable moderate congestive heart failure with small bilateral pleural effusions and bibasilar lung opacities, favor atelectasis. Electronically Signed   By: Ilona Sorrel M.D.   On: 12/03/2018 19:48   Dg Chest Port 1 View  Result Date: 12/03/2018 CLINICAL DATA:  Check endotracheal tube placement EXAM: PORTABLE CHEST 1 VIEW COMPARISON:  12/03/2018 FINDINGS: Endotracheal tube is again noted at the level of the thoracic inlet. Gastric catheter is noted extending into the stomach. Bilateral jugular central lines are again seen and stable. Diffuse vascular congestion is noted. Mild interstitial edema is seen. Cardiomegaly is noted. Likely small posterior effusions are present as well. No bony abnormality is seen. IMPRESSION: Vascular congestion and edema with mild effusions. Electronically Signed    By: Inez Catalina M.D.   On: 12/03/2018 19:19   Dg Chest Port 1 View  Result Date: 12/03/2018 CLINICAL DATA:  Respiratory failure. EXAM: PORTABLE CHEST 1 VIEW COMPARISON:  Radiograph of December 19, 2018. FINDINGS: Stable cardiomegaly. Endotracheal and nasogastric tubes are unchanged in position. Bilateral internal jugular catheters are noted which are unchanged in position. No pneumothorax is noted. Stable bibasilar atelectasis is noted with small pleural effusions. Bony thorax is unremarkable. IMPRESSION: Stable support apparatus. Stable bibasilar opacities as described above. Electronically Signed   By: Marijo Conception M.D.   On: 12/03/2018 07:41   Dg Chest Port 1 View  Result Date: 12/02/2018 CLINICAL DATA:  ET placement EXAM: PORTABLE CHEST 1 VIEW COMPARISON:  Same-day radiograph FINDINGS: Portion of the left lung base is collimated from view. Interval placement of the endotracheal tube within the upper trachea approximately 8 cm from the carina. Recommend advancement 3-4 cm to position in the mid trachea. Transesophageal tube tip and side port distal to the GE junction. Left IJ catheter tip terminates at the brachiocephalic-caval confluence. Right IJ catheter tip terminates in the lower SVC. Some improving aeration is noted in the right hemithorax. Persistent opacity is noted in both lung bases, right greater than left. Cardiomediastinal silhouette is still largely obscured. IMPRESSION: Interval placement of the endotracheal tube within the upper trachea approximately 8 cm from the carina. Recommend advancement 3-4 cm to position in the mid trachea. Remaining lines and tubes in stable position. Improving aeration of the right lung. These results will be called to the ordering clinician or representative by the Radiologist Assistant, and communication documented in the PACS or zVision Dashboard. Electronically Signed   By: Lovena Le M.D.   On: 12/02/2018 22:16   Dg Chest Port 1 View  Result Date:  12/02/2018 CLINICAL DATA:  Respiratory failure EXAM: PORTABLE CHEST 1 VIEW COMPARISON:  12/02/2018, 12/01/2018, 11/30/2018 FINDINGS: Endotracheal tube tip slightly above thoracic inlet and is 9.8 cm superior to the carina. Left IJ central venous catheter tip projects over the brachiocephalic confluence. Esophageal tube tip courses below diaphragm but is non included. Right IJ central venous catheter tip over the SVC. Interval diffuse white out of the right thorax. Truncated appearance of the right bronchus. Suspected moderate left pleural effusion with edema or layering fluid on the left. Consolidation left base.  Obscured cardiomediastinal silhouette. IMPRESSION: 1. Endotracheal tube tip slightly above thoracic inlet and is 9.8 cm superior to carina 2. Interval diffuse white out of the right thorax. Truncated appearance of the right bronchus suggestive of occlusive process. 3. Continued left pleural effusion with dense airspace disease at the left base. Electronically Signed   By: Donavan Foil M.D.   On: 12/02/2018 21:27   Dg Chest Port 1 View  Result Date: 12/02/2018 CLINICAL DATA:  Acute respiratory failure. EXAM: PORTABLE CHEST 1 VIEW COMPARISON:  12/01/2018 FINDINGS: [name] rotated to the right. Enteric tube courses into the stomach as tip is not definitely visualized. Endotracheal tube has tip approximately 7 cm above the carina. Left IJ central venous catheter unchanged as well as right IJ central venous catheter unchanged. Exam demonstrates worsening bilateral perihilar/bibasilar opacification. Findings likely due to layering bilateral effusions with bibasilar atelectasis as well as interstitial edema. Infection is possible. Cardiomegaly. Remainder of the exam is unchanged. IMPRESSION: Worsening bilateral perihilar bibasilar opacification likely worsening effusions and CHF. Infection is possible. Tubes and lines as described. Electronically Signed   By: Marin Olp M.D.   On: 12/02/2018 09:13    Dg Chest Port 1 View  Result Date: 12/01/2018 CLINICAL DATA:  Respiratory failure EXAM: PORTABLE CHEST 1 VIEW COMPARISON:  Numerous prior radiographs, most recently 11/30/2010 FINDINGS: Endotracheal tube is positioned within the upper trachea approximately 6 cm from the carina. Distal extent of the transesophageal tube is poorly visualized due to radiographic underpenetration. Left IJ approach central venous catheter tip terminates in the brachiocephalic vein. Right IJ catheter tip terminates near the superior cavoatrial junction. Redemonstration the bilateral pleural effusions which obscure the hemidiaphragms and resulting gradient basilar opacity. Bilateral airspace disease is grossly similar to comparison study accounting for differences in technique. There is redemonstration of the metallic cerclage wire associated with the right ribs. IMPRESSION: 1. Endotracheal tube tip approximately 6 cm from the carina. 2. Distal extent of the transesophageal tube is poorly visualized due to radiographic underpenetration. 3. Left IJ catheter terminates in the brachiocephalic vein. Right IJ catheter at the superior cavoatrial junction. 4. Grossly stable bilateral pleural effusions and bilateral airspace disease. Electronically Signed   By: Lovena Le M.D.   On: 12/01/2018 03:24   Dg Chest Port 1 View  Result Date: 11/30/2018 CLINICAL DATA:  Acute on chronic respiratory failure with severe hypoxia. ARDS with fever. Pneumonia. EXAM: PORTABLE CHEST 1 VIEW COMPARISON:  Single view of the chest 11/29/2018 and 11/28/2018. FINDINGS: Support tubes and lines are unchanged. Marked cardiomegaly is again seen. Left worse than right pleural effusions and airspace disease persist without change. IMPRESSION: No change in left worse than right pleural effusions and airspace disease. Marked cardiomegaly. No change in support apparatus. Electronically Signed   By: Inge Rise M.D.   On: 11/30/2018 08:37   Dg Chest Port 1  View  Result Date: 11/29/2018 CLINICAL DATA:  Central line placement. EXAM: PORTABLE CHEST 1 VIEW COMPARISON:  11/28/2018 FINDINGS: [name] rotated to the left. Endotracheal tube has tip 6.8 cm above the carina. Enteric tube courses into the stomach as tip is not visualized. Right IJ central venous catheter unchanged with tip over the SVC. Interval placement of left IJ central venous catheter with tip obliquely oriented over the left brachiocephalic vein in the midline. Lungs are adequately inflated with mild stable hazy density over the left base likely small left effusion with associated atelectasis. Mild stable prominence of the perihilar markings likely mild vascular congestion. Moderate  stable cardiomegaly. Remainder of the exam is unchanged. IMPRESSION: Moderate stable cardiomegaly with mild vascular congestion. Stable hazy density over the left base likely small effusion with associated atelectasis. Tubes and lines as described. Interval placement of left IJ central venous catheter with tip in the midline over the brachiocephalic vein. Electronically Signed   By: Marin Olp M.D.   On: 11/29/2018 15:44   Dg Chest Port 1 View  Result Date: 11/28/2018 CLINICAL DATA:  Fever, intubation EXAM: PORTABLE CHEST 1 VIEW COMPARISON:  11/26/2018 FINDINGS: No significant interval change in AP portable chest radiograph with endotracheal tube over the mid trachea, esophagogastric tube, right neck vascular catheter, cardiomegaly, and layering small bilateral pleural effusions. No new or focal airspace opacity. IMPRESSION: No significant interval change in AP portable chest radiograph with cardiomegaly and layering small pleural effusions. No new or focal airspace opacity. Unchanged support apparatus. Electronically Signed   By: Eddie Candle M.D.   On: 11/28/2018 12:47   Dg Chest Port 1 View  Result Date: 11/26/2018 CLINICAL DATA:  ET and NG tube placement. CXR approved by MD at bedside. MD states no repeat  necessary. Best attainable images due to patient conditions. EXAM: PORTABLE CHEST 1 VIEW COMPARISON:  Chest radiograph 11/24/2018, 11/22/2018 FINDINGS: Endotracheal tube tip terminates between the thoracic inlet and carina. The nasogastric tube courses below the diaphragm with nonvisualization of the distal tip. The right central venous catheter tip projects over the SVC. Cardiomegaly. No definite pneumothorax. Poor visualization of the bilateral lung bases but suspect persistent bibasilar opacities. No acute finding in the visualized skeleton. IMPRESSION: 1. Endotracheal tube tip terminates between the thoracic inlet and carina. 2. Nasogastric tube positioning is better seen on the subsequent abdominal radiograph. 3. Poor visualization of the lung bases but suspect ongoing bibasilar pulmonary opacities. Electronically Signed   By: Audie Pinto M.D.   On: 11/26/2018 16:44   Dg Chest Port 1 View  Result Date: 11/24/2018 CLINICAL DATA:  Shortness of breath EXAM: PORTABLE CHEST 1 VIEW COMPARISON:  November 22, 2018 FINDINGS: Endotracheal tube tip is seen 3.5 cm above the level of carina. NG tube is seen coursing below the diaphragm. A right-sided central venous catheter is seen within the mid SVC./slight interval improved aeration since prior exam. Probable subsegmental atelectasis remains at the lung bases. There is a retrocardiac opacity. IMPRESSION: Slight interval improvement in aeration. However there remains a retrocardiac opacity which could be atelectasis and/or infectious etiology. Electronically Signed   By: Prudencio Pair M.D.   On: 11/24/2018 03:51   Dg Chest Port 1 View  Result Date: 11/22/2018 CLINICAL DATA:  Ventilator support.  Follow-up. EXAM: PORTABLE CHEST 1 VIEW COMPARISON:  Earlier same day FINDINGS: Endotracheal tube tip is 3 cm above the carina. Orogastric or nasogastric tube enters the abdomen. Right internal jugular central line tip in the SVC above the right atrium. Persistent  bilateral lower lung atelectasis and or pneumonia. No worsening or new finding. IMPRESSION: No apparent change since earlier today. Lines and tubes well position. Persistent atelectasis and or pneumonia in the mid and lower lungs. Electronically Signed   By: Nelson Chimes M.D.   On: 11/22/2018 21:23   Dg Chest Port 1 View  Result Date: 11/22/2018 CLINICAL DATA:  Central line placement EXAM: PORTABLE CHEST 1 VIEW COMPARISON:  11/22/2018 FINDINGS: [name] rotated. Interval placement of a right IJ central venous catheter with distal tip terminating at the expected level of the superior cavoatrial junction. ET tube remains in place with distal  tip terminating 3.8 cm superior to the carina. Enteric tube courses below the diaphragm with distal tip beyond the inferior margin of the film. Multiple overlying external leads. Cardiac silhouette is largely obscured but appears enlarged. Bilateral pleural effusions. Volume loss within the right lung. Slight improved aeration of the right lung compared to prior. No pneumothorax visualized. IMPRESSION: 1. Interval placement of right IJ central venous catheter. No pneumothorax. 2. Improving aeration of the right lung. Electronically Signed   By: Davina Poke M.D.   On: 11/22/2018 14:26   Dg Chest Port 1 View  Result Date: 11/22/2018 CLINICAL DATA:  Multiple tracheobronchial mucus plugs, history CHF, diabetes mellitus, atrial fibrillation, hypertension EXAM: PORTABLE CHEST 1 VIEW COMPARISON:  Portable exam 0830 hours compared to 0035 hours FINDINGS: Tip of endotracheal tube projects 4.1 cm above carina. Nasogastric tube extends into abdomen. Volume loss in the RIGHT hemithorax with slight mediastinal shift to the RIGHT, likely reflecting a combination of atelectasis and question effusion. LEFT pleural effusion and basilar atelectasis are present. No pneumothorax. Bones demineralized. IMPRESSION: Atelectasis and probable pleural effusion opacified RIGHT hemithorax with  volume loss. Persistent LEFT pleural effusion and basilar atelectasis. Electronically Signed   By: Lavonia Dana M.D.   On: 11/22/2018 08:44   Dg Chest Port 1 View  Result Date: 11/22/2018 CLINICAL DATA:  Hypoxia. EXAM: PORTABLE CHEST 1 VIEW COMPARISON:  Radiograph yesterday at 0550 hour FINDINGS: Endotracheal tube tip 3.6 cm from the carina. Enteric tube in place tip not well visualized. Complete opacification of the right hemithorax which is new from prior exam. Heart size and mediastinal contours are obscured. Left pleural effusion with volume loss in the left lung. Congestive changes on the left. IMPRESSION: 1. Complete opacification of the right hemithorax which is new from prior exam. Question underlying mucous plugging. 2. Overall low lung volumes. 3. Support apparatus unchanged. Electronically Signed   By: Keith Rake M.D.   On: 11/22/2018 01:11   Dg Chest Port 1 View  Result Date: 11/21/2018 CLINICAL DATA:  Acute respiratory failure. EXAM: PORTABLE CHEST 1 VIEW COMPARISON:  Radiograph of November 19, 2018. FINDINGS: Stable cardiomegaly. Endotracheal and nasogastric tubes are unchanged in position. No pneumothorax is noted. Probable mild to moderate size left pleural effusion is noted with associated atelectasis or infiltrate. Minimal right basilar atelectasis may be present. Bony thorax unremarkable. IMPRESSION: Stable support apparatus. Increased left basilar density is noted concerning for mild to moderate pleural effusion with probable underlying atelectasis or infiltrate. Electronically Signed   By: Marijo Conception M.D.   On: 11/21/2018 08:39   Dg Chest Port 1 View  Result Date: 11/19/2018 CLINICAL DATA:  Respiratory failure. EXAM: PORTABLE CHEST 1 VIEW COMPARISON:  10/28/2018 prior radiographs FINDINGS: An endotracheal tube with tip 4 cm above the carina and NG tube entering the stomach with tip off the field of view again noted. Cardiomegaly, pulmonary vascular congestion, small bilateral  pleural effusions and bilateral LOWER lung atelectasis/consolidation again noted. There is no evidence of pneumothorax. IMPRESSION: Unchanged appearance of the chest with support apparatus as described. Cardiomegaly, pulmonary vascular congestion, small bilateral pleural effusions and bilateral LOWER lung atelectasis/consolidation. Electronically Signed   By: Margarette Canada M.D.   On: 11/19/2018 16:51   Dg Chest Port 1 View  Result Date: 11/17/2018 CLINICAL DATA:  Intubation EXAM: PORTABLE CHEST 1 VIEW COMPARISON:  11/17/2018, 10/24/2018 FINDINGS: Endotracheal tube tip is about 3.1 cm superior to the carina. Esophageal tube tip extends below the diaphragm but is non included. Bilateral  pleural effusions, likely layering on the right. Cardiomegaly with vascular congestion and bilateral pulmonary edema. Basilar consolidations. No pneumothorax. IMPRESSION: 1. Endotracheal tube tip about 3.1 cm superior to carina 2. Cardiomegaly with vascular congestion, pulmonary edema and bilateral pleural effusion Electronically Signed   By: Donavan Foil M.D.   On: 11/17/2018 00:12   Dg Chest Port 1 View  Result Date: 10/24/2018 CLINICAL DATA:  Shortness of breath for 2 weeks. EXAM: PORTABLE CHEST 1 VIEW COMPARISON:  11/04/2010 radiograph FINDINGS: Cardiomegaly with pulmonary vascular congestion noted. Possible mild interstitial opacities/edema noted. No pneumothorax or acute bony abnormality. Bibasilar atelectasis noted. There may be trace pleural effusions present. IMPRESSION: Cardiomegaly with pulmonary vascular congestion and possible mild interstitial edema. Bibasilar atelectasis.  Possible trace pleural effusions. Electronically Signed   By: Margarette Canada M.D.   On: 10/24/2018 17:13   Dg Abd Portable 1v  Result Date: 11/19/2018 CLINICAL DATA:  NG tube placement. EXAM: PORTABLE ABDOMEN - 1 VIEW COMPARISON:  None. FINDINGS: An NG tube is noted with tip overlying the distal stomach. IMPRESSION: NG tube with tip overlying  the distal stomach. Electronically Signed   By: Margarette Canada M.D.   On: 11/19/2018 16:51    Consults: Treatment Team:  Corey Skains, MD   Subjective:    Overnight Issues: Continues to be poorly responsive, unable to wean.  Massive anasarca.  Less mottled, off of pressors.  Has been afebrile overnight.  Objective:  Vital signs for last 24 hours: Temp:  [97.9 F (36.6 C)-101.9 F (38.8 C)] 97.9 F (36.6 C) (09/19 1200) Pulse Rate:  [38-152] 113 (09/19 1400) Resp:  [15-26] 26 (09/19 1300) BP: (74-132)/(53-93) 132/78 (09/19 1400) SpO2:  [92 %-100 %] 95 % (09/19 1400) FiO2 (%):  [30 %-40 %] 30 % (09/19 1200) Weight:  [224 kg] 224 kg (09/19 0500)  Hemodynamic parameters for last 24 hours:    Intake/Output from previous day: 09/18 0701 - 09/19 0700 In: 3737.3 [I.V.:1431.9; PX/TG:6269; IV Piggyback:130.3] Out: 2278 [Urine:2278]  Intake/Output this shift: Total I/O In: 1806.3 [I.V.:288.5; Other:30; NG/GT:1425; IV Piggyback:62.8] Out: 880 [Urine:680; Stool:200]  Vent settings for last 24 hours: Vent Mode: PCV FiO2 (%):  [30 %-40 %] 30 % Set Rate:  [20 bmp] 20 bmp PEEP:  [5 cmH20-6 cmH20] 6 cmH20 Plateau Pressure:  [21 cmH20-25 cmH20] 21 cmH20  Physical Exam:  GENERAL: Massively obese gentleman, poorly responsive on minimal sedation.  He does seem to track today.  Tracheostomy in place no vent asynchrony. HEAD: Normocephalic, atraumatic.  EYES: Pupils equal, round, reactive to light.  No scleral icterus.  There is scleral edema. MOUTH: Oral mucosa without thrush. NECK: Supple. Tracheostomy in place, clean PULMONARY: +rhonchi, no wheezing, scattered rales CARDIOVASCULAR: S1 and S2. Regular rate and rhythm. No murmurs, rubs, or gallops.  GASTROINTESTINAL: Soft, non--distended.  Protuberant/obese.  Positive bowel sounds.  MUSCULOSKELETAL: 3-4+ anasarca.  Significantly less mottling on toes and fingertips. NEUROLOGIC: Poorly responsive, appears to track but not following  commands. SKIN:intact,warm,dry  Assessment/Plan:  1. Acute on chronic hypoxic/hypercapnic respiratory failure with prolonged ventilator dependence:Bilateral pleural effusions with pulmonary edema due to worsening renal failure, underlying extreme obesity with obesity hypoventilation and severe OSA.  Compressive atelectasis has improved however continues to require full ventilatory support.  He is extremely volume overloaded but did not tolerate dialysis.  May need restart of CRRT.  Ventilator changes made as appropriate. Titrate O2 and PEEP for saturations of 88 to 92%.Bronchodilator therapy/pulmonary toilet.   Had element of ARDS initially this appears  now to be resolved.  Chronic respiratory failure on the basis of Pickwickian syndrome due to extreme obesity with obesity hypoventilation syndrome.  2. Resolving shock physiology, off pressors: Complicated by acute diastolic heart failure decompensation:Has not been able to tolerate dialysis.  Has significant volume overload.  May need CRRT as above.  3. Acute kidney failuresuperimposed on chronic kidney disease stage III: Appreciate nephrology's input. Discussed with Dr. Candiss Norse, patient is making some urine however, filtering capacity is decreased.  He is exceedingly volume overloaded and ideally should be dialyzed however, he is not tolerating this.  Dr. Candiss Norse proposes potential CRRT if urine output does not improve.  His prognosis in this regard continues to be very poor.  4. Altered mental status/obtundation/ventilator associated discomfort:Continue to decrease sedatives.  Decreasing sedatives receiving via OG. Toxic metabolic encephalopathy due to multiple comorbid situations coexisting, continue to monitor.  Previously CT scan of the head did not show any abnormality.  Patient exceeds table capacity for MRI.  5. Fever: He was treated for pneumonia initially.  He has had recurrent fever, last antibiotic had been Zosyn.  This had been  discontinued.  Will panculture and start Huxley.  Had vancomycin x1 dose.  6.  Atrial fibrillation with rapid ventricular response: He is on amiodarone, required rebolus.  Monitor.  Appears to be in sinus currently.  7.  Type 2 diabetes with persistent hyperglycemia: ICU hyperglycemia protocol.  8.  Thrombocytopenia, persistent: HIT antibodies negative.  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  Discontinued aspirin due to worsening thrombocytopenia.  No need for transfusion yet but will continue to monitor for potential signs of bleeding.  9.  Severe protein malnutrition enthesis albumin 1.5) in the setting of massive obesity (sarcopenic obesity): He is receiving nutrition, per Dr.Singh will start albumin hopefully to decrease third spacing.   Patient remains DNR.[name] prognosis is exceedingly poor.      LOS: 22 days   Additional comments: None  Critical Care Total Time*: 35 Minutes  C. Derrill Kay, MD [facility] 12/08/2018  *Care during the described time interval was provided by me and/or other providers on the critical care team.  I have reviewed this patient's available data, including medical history, events of note, physical examination and test results as part of my evaluation.

## 2018-12-08 NOTE — Progress Notes (Signed)
[facility] NAME: Kyle White    MR#:  [id number]  DATE OF BIRTH:  September 13, 1960  SUBJECTIVE:  Pt is afebrile,remains intubated on the ventilator, severely hypoxemic with underlying pickwickian syndrome,unsuccessful weaning trials.now has trach  Critically ill, uremic, started hemodialysis--issues with catheter clotting  patient became bradycardia with dialysis yday. Not able to complete the treatment underwent bronchoscopy 11/22/18.  Palliative care team is involved   patient now on IV  amiodarone, fentanyl  S/p trach 12/02/2018 S/p right femoral catheter  Patient response to painful stimuli. He is not able to track around. eye's open looking at the ceiling REVIEW OF SYSTEMS:   Review of Systems  Unable to perform ROS: Intubated  Psychiatric/Behavioral: Nervous/anxious:      DRUG ALLERGIES:   No Active Allergies  VITALS:  Blood pressure (!) 124/93, pulse (!) 109, temperature 97.9 F (36.6 C), temperature source Axillary, resp. rate 15, height 5' 9.02" (1.753 m), weight (!) 224 kg, SpO2 100 %.  PHYSICAL EXAMINATION:   Physical Exam  GENERAL:  58 y.o.-year-old patient lying in the bed with no acute distress. Morbidly obese  Critically ill ,on the vent EYES: Pupils equal, round, reactive to light and accommodation. No scleral icterus. Extraocular muscles intact.  HEENT: Head atraumatic, normocephalic. Oropharynx and nasopharynx clear. TRACH+ NECK:  Supple, no jugular venous distention. No thyroid enlargement, no tenderness.  LUNGS: Mod breath sounds bilaterally, positive wheezing, rales, rhonchi. CARDIOVASCULAR: S1, S2 normal. No murmurs, rubs, or gallops.  ABDOMEN: Soft, nontender, nondistended. Bowel sounds present. No organomegaly or mass.  EXTREMITIES: edema+NEUROLOGIC: awake reponds to painful stimuli  right groin HD cath SKIN: per RN documetnation  LABORATORY PANEL:  CBC Recent Labs  Lab 12/08/18 0615  WBC  3.2*  HGB 8.2*  HCT 26.2*  PLT 45*    Chemistries  Recent Labs  Lab 12/07/18 1505 12/08/18 0615  NA  --  148*  K  --  3.5  CL  --  114*  CO2  --  23  GLUCOSE  --  191*  BUN  --  136*  CREATININE  --  2.80*  CALCIUM  --  7.8*  MG  --  2.5*  AST 61*  --   ALT 84*  --   ALKPHOS 382*  --   BILITOT 0.9  --    Cardiac Enzymes No results for input(s): TROPONINI in the last 168 hours. RADIOLOGY:  Dg Chest Port 1 View  Result Date: 12/07/2018 CLINICAL DATA:  Respiratory failure EXAM: PORTABLE CHEST 1 VIEW COMPARISON:  December 03, 2018 FINDINGS: Limited due to patient position. Endotracheal tube tip is seen 4 cm above the carina. NG tube is seen coursing below the diaphragm. A right-sided central venous catheter seen at the superior cavoatrial junction. Hazy ground-glass and interstitial opacities seen throughout both lungs. There is interval improvement in the right lung aeration. Again noted is cardiomegaly and a probable small bilateral pleural effusions. IMPRESSION: 1. Support lines and tubes in satisfactory position. 2. Interval improvement in the right lung aeration. 3. Diffuse hazy ground-glass and interstitial opacities , which could be due to infection/atelectasis. 4. Small bilateral pleural effusions Electronically Signed   By: Prudencio Pair M.D.   On: 12/07/2018 15:23   ASSESSMENT AND PLAN:  Kyle White  is a 58 y.o. male with a known history of atrial fibrillation on Coumadin, CHF, diabetes mellitus, and obstructive sleep apnea with CPAP use at home, morbid obesity with sedentary lifestyle on  oxygen at 2 L/min at home.  1.Acute on chronic  respiratory failure with severe hypoxia- underlying pickwickian syndrome -No clinical improvement, unsuccessful weaning trials--s/p tracheostomy 11/28/2018 -  -patient underwent bronchoscopy 11/22/2018. 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 -vent support --- extremely poor prognosis with high mortality and morbidity  #-Sepsis from  pneumonia with ARDS Goal is to keep map greater than or equal to 65 -OFF IV phenylephrine gtt -compledted Abxs  #.Obstructive sleep apnea-- chronic  #Acute on chronic diastolic CHF, EF 55 to 123456 on echo in 2014 at [facility] -Repeat echocardiogram shows EF of 55 to 60%  # History of atrial fibrillation -IV amiodarone gtt -Coumadin on hold  -Heparin drip  discontinued in view of thrombocytopenia   #. Acute renal failure on chronic kidney disease stage III--NOW on HD  with baseline creatinine 1.28-1.89-1.87-2.25-2.21-3.05--3.17-3.11--2.5 -good UOP -continue to monitor renal function closely -Patient being uremic nephrology started the patient on hemodialysis--now on hold due to hemodynamic instability -Has issues with Perm cath--has right groin temp cath +  #  Type IIDiabetes mellitus -Sliding scale insulin + lantus -Hemoglobin A1c 7.3    Overall poor prognosis. Appreciate palliative care input.   CODE STATUS: DNR  DVT Prophylaxis: scd as patient is thrombocytopenic  TOTAL TIME TAKING CARE OF THIS PATIENT:25 minutes.  >50% time spent on counselling and coordination of care  POSSIBLE D/C IN *?* DAYS, DEPENDING ON CLINICAL CONDITION.  Note: This dictation was prepared with Dragon dictation along with smaller phrase technology. Any transcriptional errors that result from this process are unintentional.   Fritzi Mandes M.D on 12/08/2018 at 12:25 PM  Between 7am to 6pm - Pager - [phone]  After 6pm go to [email or web address] - password [email or web address] [facility]  Office  [phone]  CC: Primary care physician; Inc, [place] ServicesPatient ID: Kyle White, male   DOB: 1960-07-18, 58 y.o.   MRN: [id number]

## 2018-12-08 NOTE — Progress Notes (Signed)
[facility], [place] 12/08/18  Subjective:   LOS: 22 09/18 0701 - 09/19 0700 In: 3737.3 [I.V.:1431.9; PJ/AS:5053; IV Piggyback:130.3] Out: 2278 [Urine:2278] Critically ill Vent assisted. Fio2 30 % Sedation - fentanyl Continues to have large amount of edema   Did not tolerated HD last treatment - caused hemodynamic instablity   Objective:  Vital signs in last 24 hours:  Temp:  [98.8 F (37.1 C)-101.9 F (38.8 C)] 98.8 F (37.1 C) (09/19 0700) Pulse Rate:  [38-151] 81 (09/19 0800) Resp:  [19-23] 20 (09/19 0800) BP: (74-124)/(53-90) 114/71 (09/19 0800) SpO2:  [89 %-100 %] 98 % (09/19 0800) FiO2 (%):  [30 %-40 %] 30 % (09/19 0800) Weight:  [224 kg] 224 kg (09/19 0500)  Weight change: -3 kg Filed Weights   12/06/18 0500 12/07/18 0500 12/08/18 0500  Weight: (!) 224 kg (!) 227 kg (!) 224 kg    Intake/Output:    Intake/Output Summary (Last 24 hours) at 12/08/2018 0843 Last data filed at 12/08/2018 0800 Gross per 24 hour  Intake 4247.92 ml  Output 1928 ml  Net 2319.92 ml     Physical Exam: General: critically ill apearing  HEENT  NGT in place, conjunctival edema  Pulm/lungs Vent assisted, Tracheostomy;  placed 11/28/2018,  CVS/Heart Irregular, tachycardic  Abdomen:  Obese, dependent edema  Extremities: SCDs, + dependent edema  Neurologic: Sedated, eyes open, did not respond to voice or touch  Skin: warm  Access: Rt femoral dialysis cathter in place   Rectal tube, Foley in place    Basic Metabolic Panel:  Recent Labs  Lab 12/04/18 0438 11/22/2018 0413 12/06/18 0503 12/07/18 0505 12/08/18 0615  NA 149* 147* 150* 148* 148*  K 3.1* 3.5 3.6 3.5 3.5  CL 111 111 115* 114* 114*  CO2 _0 GLUCOSE 181* 240* 174* 182* 191*  BUN 144* 141* 123* 118* 136*  CREATININE 3.38* 3.29* 2.92* 2.55* 2.80*  CALCIUM 8.0* 7.9* 8.1* 7.9* 7.8*  MG 2.9*  --   --   --  2.5*  PHOS 6.2*  --   --   --  5.9*     CBC: Recent Labs  Lab  12/03/18 0435 12/04/18 0438 12/01/2018 0413 12/06/2018 1630 12/06/18 0503 12/07/18 0505 12/08/18 0615  WBC 8.4 5.1 4.3  --  3.9* 4.1 3.2*  NEUTROABS 7.2  --   --   --   --  3.3 2.6  HGB 10.6* 9.9* 9.5*  --  9.3* 9.0* 8.2*  HCT 32.3* 31.4* 30.6*  --  29.9* 28.5* 26.2*  MCV 93.4 94.6 95.3  --  95.8 95.0 96.0  PLT 57* 46* 47* 57* 54* 51* 45*      Lab Results  Component Value Date   HEPBSAG Negative 11/29/2018   HEPBSAB Non Reactive 11/29/2018   HEPBIGM Negative 11/29/2018      Microbiology:  Recent Results (from the past 240 hour(s))  Respiratory Panel by PCR     Status: None   Collection Time: 11/28/18 10:36 AM   Specimen: Nasopharyngeal Swab; Respiratory  Result Value Ref Range Status   Adenovirus NOT DETECTED NOT DETECTED Final   Coronavirus 229E NOT DETECTED NOT DETECTED Final    Comment: (NOTE) The Coronavirus on the Respiratory Panel, DOES NOT test for the novel  Coronavirus (2019 nCoV)    Coronavirus HKU1 NOT DETECTED NOT DETECTED Final   Coronavirus NL63 NOT DETECTED NOT DETECTED Final   Coronavirus OC43 NOT DETECTED NOT DETECTED Final   Metapneumovirus NOT  DETECTED NOT DETECTED Final   Rhinovirus / Enterovirus NOT DETECTED NOT DETECTED Final   Influenza A NOT DETECTED NOT DETECTED Final   Influenza B NOT DETECTED NOT DETECTED Final   Parainfluenza Virus 1 NOT DETECTED NOT DETECTED Final   Parainfluenza Virus 2 NOT DETECTED NOT DETECTED Final   Parainfluenza Virus 3 NOT DETECTED NOT DETECTED Final   Parainfluenza Virus 4 NOT DETECTED NOT DETECTED Final   Respiratory Syncytial Virus NOT DETECTED NOT DETECTED Final   Bordetella pertussis NOT DETECTED NOT DETECTED Final   Chlamydophila pneumoniae NOT DETECTED NOT DETECTED Final   Mycoplasma pneumoniae NOT DETECTED NOT DETECTED Final    Comment: Performed at [facility], [place] [address], [place], [place] [address]  Culture, respiratory (non-expectorated)     Status: None   Collection Time: 11/28/18 11:29 AM    Specimen: Tracheal Aspirate; Respiratory  Result Value Ref Range Status   Specimen Description   Final    TRACHEAL ASPIRATE Performed at [facility], [address], [place], [place] [address]    Special Requests   Final    NONE Performed at [facility], [address] [place], [place] [address]    Gram Stain   Final    FEW WBC PRESENT, PREDOMINANTLY PMN FEW SQUAMOUS EPITHELIAL CELLS PRESENT RARE GRAM POSITIVE COCCI IN PAIRS    Culture   Final    FEW Consistent with normal respiratory flora. Performed at [facility], [place] [address], [place], [place] [address]    Report Status 11/30/2018 FINAL  Final  Culture, respiratory (non-expectorated)     Status: None   Collection Time: 12/02/18  8:45 AM   Specimen: Tracheal Aspirate; Respiratory  Result Value Ref Range Status   Specimen Description   Final    TRACHEAL ASPIRATE Performed at [facility], [address], [place], [place] [address]    Special Requests   Final    NONE Performed at [facility], [address] [place], [place] [address]    Gram Stain   Final    NO WBC SEEN NO ORGANISMS SEEN Performed at [facility], [place] [address], [place], [place] [address]    Culture FEW CANDIDA ALBICANS  Final   Report Status 12/04/2018 FINAL  Final  Culture, respiratory (non-expectorated)     Status: None (Preliminary result)   Collection Time: 12/07/18  3:24 PM   Specimen: Tracheal Aspirate; Respiratory  Result Value Ref Range Status   Specimen Description   Final    TRACHEAL ASPIRATE Performed at [facility], [address] [place], [place] [address]    Special Requests   Final    NONE Performed at [facility], [address] [place], [place] [address]    Gram Stain   Final    FEW WBC PRESENT, PREDOMINANTLY PMN RARE YEAST RARE GRAM POSITIVE COCCI IN CLUSTERS Performed at [facility], [place] [address], [place], [place] [address]     Culture PENDING  Incomplete   Report Status PENDING  Incomplete  CULTURE, BLOOD (ROUTINE X 2) w Reflex to ID Panel     Status: None (Preliminary result)   Collection Time: 12/07/18  3:27 PM   Specimen: BLOOD  Result Value Ref Range Status   Specimen Description BLOOD PORT  Final   Special Requests   Final    BOTTLES DRAWN AEROBIC AND ANAEROBIC Blood Culture adequate volume   Culture   Final    NO  GROWTH < 24 HOURS Performed at [facility], [address] [place], [place] [address]    Report Status PENDING  Incomplete  CULTURE, BLOOD (ROUTINE X 2) w Reflex to ID Panel     Status: None (Preliminary result)   Collection Time: 12/07/18  3:30 PM   Specimen: BLOOD  Result Value Ref Range Status   Specimen Description BLOOD RT HAND  Final   Special Requests   Final    BOTTLES DRAWN AEROBIC AND ANAEROBIC Blood Culture adequate volume   Culture   Final    NO GROWTH < 24 HOURS Performed at [facility], [address] [place], [place] [address]    Report Status PENDING  Incomplete    Coagulation Studies: No results for input(s): LABPROT, INR in the last 72 hours.  Urinalysis: Recent Labs    12/07/18 1516  COLORURINE YELLOW*  LABSPEC 1.015  PHURINE 5.0  GLUCOSEU NEGATIVE  HGBUR LARGE*  BILIRUBINUR NEGATIVE  KETONESUR NEGATIVE  PROTEINUR 30*  NITRITE NEGATIVE  LEUKOCYTESUR NEGATIVE      Imaging: Dg Chest Port 1 View  Result Date: 12/07/2018 CLINICAL DATA:  Respiratory failure EXAM: PORTABLE CHEST 1 VIEW COMPARISON:  December 03, 2018 FINDINGS: Limited due to patient position. Endotracheal tube tip is seen 4 cm above the carina. NG tube is seen coursing below the diaphragm. A right-sided central venous catheter seen at the superior cavoatrial junction. Hazy ground-glass and interstitial opacities seen throughout both lungs. There is interval improvement in the right lung aeration. Again noted is cardiomegaly and a probable small bilateral pleural  effusions. IMPRESSION: 1. Support lines and tubes in satisfactory position. 2. Interval improvement in the right lung aeration. 3. Diffuse hazy ground-glass and interstitial opacities , which could be due to infection/atelectasis. 4. Small bilateral pleural effusions Electronically Signed   By: Prudencio Pair M.D.   On: 12/07/2018 15:23     Medications:   . sodium chloride Stopped (12/03/18 0527)  . amiodarone 60 mg/hr (12/08/18 0800)  . anticoagulant sodium citrate    . dexmedetomidine (PRECEDEX) IV infusion Stopped (12/07/18 1303)  . feeding supplement (VITAL HIGH PROTEIN) 1,000 mL (12/07/18 1543)  . fentaNYL infusion INTRAVENOUS 50 mcg/hr (12/08/18 0800)  . meropenem (MERREM) IV Stopped (12/08/18 5456)  . phenylephrine (NEO-SYNEPHRINE) Adult infusion Stopped (12/08/18 0430)   . aspirin  81 mg Per NG tube Daily  . chlorhexidine gluconate (MEDLINE KIT)  15 mL Mouth Rinse BID  . Chlorhexidine Gluconate Cloth  6 each Topical Daily  . clonazePAM  1 mg Per Tube Daily  . clonazePAM  2 mg Per Tube QHS  . feeding supplement (PRO-STAT SUGAR FREE 64)  60 mL Per Tube TID  . free water  200 mL Per Tube Q4H  . insulin aspart  0-20 Units Subcutaneous Q4H  . insulin aspart  4 Units Subcutaneous Q4H  . insulin glargine  40 Units Subcutaneous Daily  . ipratropium-albuterol  3 mL Nebulization Q6H  . mouth rinse  15 mL Mouth Rinse 10 times per day  . multivitamin  15 mL Per Tube Daily  . pantoprazole sodium  40 mg Per Tube QHS  . QUEtiapine  25 mg Per Tube QHS  . QUEtiapine  50 mg Per Tube QHS  . sodium chloride flush  10-40 mL Intracatheter Q12H   sodium chloride, acetaminophen, anticoagulant sodium citrate, bisacodyl, fentaNYL, metoprolol tartrate, midazolam, sodium chloride flush  Assessment/ Plan:  58 y.o. male with with atrial fibrillation on warfarin, congestive heart failure, diabetes mellitus  type 2, obstructive sleep apnea,morbid obesity, gout, mitral valve replacementwho was admitted to  [facility] on[facility]2020for acute exacerbation of COPD  Active Problems:   Acute exacerbation of CHF (congestive heart failure) ([place])   Acute respiratory failure with hypoxia and hypercapnia ([facility])   Goals of care, counseling/discussion   Palliative care by specialist   DNR (do not resuscitate) discussion   COPD exacerbation ([place])   #. ARF on CKD st3 Recent Labs    11/27/2018 0413 12/06/18 0503 12/07/18 0505 12/08/18 0615  CREATININE 3.29* 2.92* 2.55* 2.80*  S Creatinine and BUN remain critically elevated  UOP about 2200 - did not tolerate HD 9/17. Will monitor for now  - Avoid nephrotoxins such as NSAIDs, iv contrast, Fleets enema  # Hypokalemia,  - replace prn   # hypernatremia - agree with free water replacement  #. Anemia    Lab Results  Component Value Date   HGB 8.2 (L) 12/08/2018   # Acute resp failure - Vent dependent - Fio2 30%    # generalized Edema - 2 D echo from 8/29 shows LVEF 55-60% (nomral).  - Abnormal severe thickening of aortic wall Tricuspid valve and Rt vent were not assesed - Amiodarone for A Fib -add albumin for oncotic support   #. Diabetes type 2 with CKD Hgb A1c MFr Bld (%)  Date Value  11/17/2018 7.3 (H)        LOS: [place] 9/19/20208:43 AM  [facility] [place], [place]

## 2018-12-08 NOTE — Plan of Care (Signed)
  Problem: Health Behavior/Discharge Planning: Goal: Ability to manage health-related needs will improve Outcome: Not Progressing   Problem: Clinical Measurements: Goal: Ability to maintain clinical measurements within normal limits will improve Outcome: Not Progressing Goal: Will remain free from infection Outcome: Not Progressing Goal: Diagnostic test results will improve Outcome: Not Progressing Goal: Respiratory complications will improve Outcome: Not Progressing Goal: Cardiovascular complication will be avoided Outcome: Not Progressing   Problem: Activity: Goal: Risk for activity intolerance will decrease Outcome: Not Progressing   Problem: Coping: Goal: Level of anxiety will decrease Outcome: Not Progressing   Problem: Elimination: Goal: Will not experience complications related to bowel motility Outcome: Not Progressing Goal: Will not experience complications related to urinary retention Outcome: Not Progressing   Problem: Pain Managment: Goal: General experience of comfort will improve Outcome: Not Progressing

## 2018-12-08 NOTE — Progress Notes (Signed)
Pharmacy Antibiotic Note  Kyle White is a 58 y.o. male admitted on 10/25/2018 with pneumonia.  Pharmacy has been consulted for meropenem dosing.  Plan: Adjust Meropenem to 1 g IV q8h based on normalized CrCl ~53 ml/min. Will need close monitoring of renal function and adjust dose as needed.  Height: 5' 9.02" (175.3 cm) Weight: (!) 493 lb 13.3 oz (224 kg) IBW/kg (Calculated) : 70.74  Temp (24hrs), Avg:99.3 F (37.4 C), Min:97.9 F (36.6 C), Max:100.4 F (38 C)  Recent Labs  Lab 12/04/18 0438 12/04/18 1400 11/27/2018 0413 12/06/18 0503 12/07/18 0505 12/08/18 0615  WBC 5.1  --  4.3 3.9* 4.1 3.2*  CREATININE 3.38*  --  3.29* 2.92* 2.55* 2.80*  VANCORANDOM  --  19  --   --  14  --     Estimated Creatinine Clearance: 53 mL/min (A) (by C-G formula based on SCr of 2.8 mg/dL (H)).    No Active Allergies    Thank you for allowing pharmacy to be a part of this patient's care.  Rowland Lathe 12/08/2018 3:15 PM

## 2018-12-08 NOTE — Progress Notes (Addendum)
Shift summary:  - Patient remains on Trach/Vent. - Sedation minimized. - Sister and GF both updated via telephone. - IV Albumin initiated today. - Continues to have bloody secretions from trach.

## 2018-12-09 LAB — CBC
HCT: 28.4 % — ABNORMAL LOW (ref 39.0–52.0)
Hemoglobin: 8.8 g/dL — ABNORMAL LOW (ref 13.0–17.0)
MCH: 29.4 pg (ref 26.0–34.0)
MCHC: 31 g/dL (ref 30.0–36.0)
MCV: 95 fL (ref 80.0–100.0)
Platelets: 51 10*3/uL — ABNORMAL LOW (ref 150–400)
RBC: 2.99 MIL/uL — ABNORMAL LOW (ref 4.22–5.81)
RDW: 15 % (ref 11.5–15.5)
WBC: 3.3 10*3/uL — ABNORMAL LOW (ref 4.0–10.5)
nRBC: 1.8 % — ABNORMAL HIGH (ref 0.0–0.2)

## 2018-12-09 LAB — RENAL FUNCTION PANEL
Albumin: 1.7 g/dL — ABNORMAL LOW (ref 3.5–5.0)
Anion gap: 11 (ref 5–15)
BUN: 121 mg/dL — ABNORMAL HIGH (ref 6–20)
CO2: 24 mmol/L (ref 22–32)
Calcium: 8.2 mg/dL — ABNORMAL LOW (ref 8.9–10.3)
Chloride: 114 mmol/L — ABNORMAL HIGH (ref 98–111)
Creatinine, Ser: 2.49 mg/dL — ABNORMAL HIGH (ref 0.61–1.24)
GFR calc Af Amer: 32 mL/min — ABNORMAL LOW (ref >60)
GFR calc non Af Amer: 27 mL/min — ABNORMAL LOW (ref >60)
Glucose, Bld: 181 mg/dL — ABNORMAL HIGH (ref 70–99)
Phosphorus: 4.8 mg/dL — ABNORMAL HIGH (ref 2.5–4.6)
Potassium: 3 mmol/L — ABNORMAL LOW (ref 3.5–5.1)
Sodium: 149 mmol/L — ABNORMAL HIGH (ref 135–145)

## 2018-12-09 LAB — GLUCOSE, CAPILLARY
Glucose-Capillary: 153 mg/dL — ABNORMAL HIGH (ref 70–99)
Glucose-Capillary: 159 mg/dL — ABNORMAL HIGH (ref 70–99)
Glucose-Capillary: 182 mg/dL — ABNORMAL HIGH (ref 70–99)
Glucose-Capillary: 182 mg/dL — ABNORMAL HIGH (ref 70–99)
Glucose-Capillary: 190 mg/dL — ABNORMAL HIGH (ref 70–99)
Glucose-Capillary: 201 mg/dL — ABNORMAL HIGH (ref 70–99)
Glucose-Capillary: 215 mg/dL — ABNORMAL HIGH (ref 70–99)

## 2018-12-09 LAB — CULTURE, RESPIRATORY W GRAM STAIN

## 2018-12-09 LAB — PREALBUMIN: Prealbumin: 5.5 mg/dL — ABNORMAL LOW (ref 18–38)

## 2018-12-09 MED ORDER — POTASSIUM CHLORIDE 20 MEQ PO PACK
40.0000 meq | PACK | Freq: Once | ORAL | Status: AC
  Administered 2018-12-09: 40 meq via ORAL
  Filled 2018-12-09: qty 2

## 2018-12-09 NOTE — Plan of Care (Signed)
  Problem: Health Behavior/Discharge Planning: Goal: Ability to manage health-related needs will improve Outcome: Not Progressing   Problem: Clinical Measurements: Goal: Ability to maintain clinical measurements within normal limits will improve Outcome: Not Progressing Goal: Will remain free from infection Outcome: Not Progressing Goal: Diagnostic test results will improve Outcome: Not Progressing Goal: Respiratory complications will improve Outcome: Not Progressing Goal: Cardiovascular complication will be avoided Outcome: Not Progressing   Problem: Activity: Goal: Risk for activity intolerance will decrease Outcome: Not Progressing   Problem: Coping: Goal: Level of anxiety will decrease Outcome: Not Progressing   Problem: Elimination: Goal: Will not experience complications related to bowel motility Outcome: Not Progressing Goal: Will not experience complications related to urinary retention Outcome: Not Progressing   Problem: Pain Managment: Goal: General experience of comfort will improve Outcome: Not Progressing

## 2018-12-09 NOTE — Progress Notes (Signed)
Shift summary:  - UOP increasing over past 24 hours. - PSV x 2 hrs this AM. - Metoprolol 5 mg IV x 1 for RVR this AM.

## 2018-12-09 NOTE — Progress Notes (Addendum)
Pharmacy Electrolyte Monitoring Consult:  Pharmacy consulted to assist in monitoring and replacing electrolytes in this 58 y.o. male admitted on 11/11/2018. [name] currently intubated and sedated on dexmedetomidine and fentanyl infusion.   Labs:  Sodium (mmol/L)  Date Value  12/09/2018 149 (H)   Potassium (mmol/L)  Date Value  12/09/2018 3.0 (L)   Magnesium (mg/dL)  Date Value  12/08/2018 2.5 (H)   Phosphorus (mg/dL)  Date Value  12/09/2018 4.8 (H)   Calcium (mg/dL)  Date Value  12/09/2018 8.2 (L)   Albumin (g/dL)  Date Value  12/09/2018 1.7 (L)   Corrected Calcium: 9.7  Assessment/Plan: 1. Electrolytes: Sodium trending slightly up today. Hypokalemia on labs this morning. Unable to tolerate last session of dialysis. [name] fluid overloaded. Albumin added yesterday to help with oncotic pressure. Creatinine improving today. Continue to follow dialysis plan. Currently having regular bowel movements.    --Free water flushes at 200 mL q4h    --Oral potassium chloride 40 mEq x1 today    --BMP and Mg with AM labs  2. Constipation: Patient with bowel movement last five days. Per nephrology avoid Fleet enemas. Constipating medications include fentanyl infusion. [name] also diabetic. Bowel regimen discontinued due to regular bowel movements. Continue to monitor for further constipation or diarrhea.   3. Glucose: History of persistent hyperglycemia this admission. [name] now off of insulin drip and transitioned to [place] insulin. Diet includes continuous tube feeds as tolerated. Corticosteroids discontinued 9/11. Glucoses within goal range.    --Continue insulin glargine 40 units daily    --Continue 4 units insulin aspart q4h while on tube feeds    --Continue SSI q4h.   Pharmacy will continue to monitor and adjust per consult.   [place] Resident 12/09/2018 7:23 AM

## 2018-12-09 NOTE — Progress Notes (Signed)
[facility] NAME: Kyle White    MR#:  [id number]  DATE OF BIRTH:  1960-06-29  SUBJECTIVE:  Pt is remains intubated on the ventilator, severely hypoxemic with underlying pickwickian syndrome,unsuccessful weaning trials.now has trach  Critically ill, uremic, started hemodialysis--issues with catheter clotting  patient became bradycardia with dialysis yday. Not able to complete the treatment underwent bronchoscopy 11/22/18.  Palliative care team is involved   patient now on IV  amiodarone, fentanyl Had fever--now on Meropenem  S/p trach 11/30/2018 S/p right femoral catheter  Patient response to painful stimuli. He is not able to track around. eye's open looking at the ceiling REVIEW OF SYSTEMS:   Review of Systems  Unable to perform ROS: Intubated  Psychiatric/Behavioral: Nervous/anxious:      DRUG ALLERGIES:   No Active Allergies  VITALS:  Blood pressure 104/75, pulse 82, temperature 97.9 F (36.6 C), temperature source Axillary, resp. rate (!) 22, height 5' 9.02" (1.753 m), weight (!) 224 kg, SpO2 96 %.  PHYSICAL EXAMINATION:   Physical Exam  GENERAL:  58 y.o.-year-old patient lying in the bed with no acute distress. Morbidly obese  Critically ill ,on the vent EYES: Pupils equal, round, reactive to light and accommodation. No scleral icterus. Extraocular muscles intact.  HEENT: Head atraumatic, normocephalic. Oropharynx and nasopharynx clear. TRACH+ NECK:  Supple, no jugular venous distention. No thyroid enlargement, no tenderness.  LUNGS: Mod breath sounds bilaterally, positive wheezing, rales, rhonchi. CARDIOVASCULAR: S1, S2 normal. No murmurs, rubs, or gallops.  ABDOMEN: Soft, nontender, nondistended. Bowel sounds present. No organomegaly or mass.  EXTREMITIES: edema+NEUROLOGIC: awake reponds to painful stimuli  right groin HD cath SKIN: per RN documetnation  LABORATORY PANEL:  CBC Recent Labs  Lab  12/09/18 0446  WBC 3.3*  HGB 8.8*  HCT 28.4*  PLT 51*    Chemistries  Recent Labs  Lab 12/07/18 1505 12/08/18 0615 12/09/18 0446  NA  --  148* 149*  K  --  3.5 3.0*  CL  --  114* 114*  CO2  --  23 24  GLUCOSE  --  191* 181*  BUN  --  136* 121*  CREATININE  --  2.80* 2.49*  CALCIUM  --  7.8* 8.2*  MG  --  2.5*  --   AST 61*  --   --   ALT 84*  --   --   ALKPHOS 382*  --   --   BILITOT 0.9  --   --    Cardiac Enzymes No results for input(s): TROPONINI in the last 168 hours. RADIOLOGY:  Dg Chest Port 1 View  Result Date: 12/07/2018 CLINICAL DATA:  Respiratory failure EXAM: PORTABLE CHEST 1 VIEW COMPARISON:  December 03, 2018 FINDINGS: Limited due to patient position. Endotracheal tube tip is seen 4 cm above the carina. NG tube is seen coursing below the diaphragm. A right-sided central venous catheter seen at the superior cavoatrial junction. Hazy ground-glass and interstitial opacities seen throughout both lungs. There is interval improvement in the right lung aeration. Again noted is cardiomegaly and a probable small bilateral pleural effusions. IMPRESSION: 1. Support lines and tubes in satisfactory position. 2. Interval improvement in the right lung aeration. 3. Diffuse hazy ground-glass and interstitial opacities , which could be due to infection/atelectasis. 4. Small bilateral pleural effusions Electronically Signed   By: Prudencio Pair M.D.   On: 12/07/2018 15:23   ASSESSMENT AND PLAN:  Kyle White  is a 59 y.o.  male with a known history of atrial fibrillation on Coumadin, CHF, diabetes mellitus, and obstructive sleep apnea with CPAP use at home, morbid obesity with sedentary lifestyle on oxygen at 2 L/min at home.  1.Acute on chronic  respiratory failure with severe hypoxia- underlying pickwickian syndrome -No clinical improvement, unsuccessful weaning trials--s/p tracheostomy 12/03/2018 -  -patient underwent bronchoscopy 11/22/2018. 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 -vent support --- extremely poor prognosis with high mortality and morbidity  #-Sepsis from pneumonia with ARDS Goal is to keep map greater than or equal to 65 -OFF IV phenylephrine gtt -compledted Abxs -had fever--now on meropenem since 12/08/2018  #.Obstructive sleep apnea-- chronic  #Acute on chronic diastolic CHF, EF 55 to 123456 on echo in 2014 at [facility] -Repeat echocardiogram shows EF of 55 to 60%  # History of atrial fibrillation -IV amiodarone gtt -Coumadin on hold  -Heparin drip  discontinued in view of thrombocytopenia   #. Acute renal failure on chronic kidney disease stage III--NOW on HD  with baseline creatinine 1.28-1.89-1.87-2.25-2.21-3.05--3.17-3.11--2.5 -good UOP -continue to monitor renal function closely -Patient being uremic nephrology started the patient on hemodialysis--now on hold due to hemodynamic instability -Has issues with Perm cath--has right groin temp cath +  #  Type IIDiabetes mellitus -Sliding scale insulin + lantus -Hemoglobin A1c 7.3    Overall poor prognosis. Appreciate palliative care input.   CODE STATUS: DNR  DVT Prophylaxis: scd as patient is thrombocytopenic  TOTAL TIME TAKING CARE OF THIS PATIENT:25 minutes.  >50% time spent on counselling and coordination of care  POSSIBLE D/C IN *?* DAYS, DEPENDING ON CLINICAL CONDITION.  Note: This dictation was prepared with Dragon dictation along with smaller phrase technology. Any transcriptional errors that result from this process are unintentional.   Fritzi Mandes M.D on 12/09/2018 at 1:59 PM  Between 7am to 6pm - Pager - [phone]  After 6pm go to [email or web address] - password [email or web address] [place] [facility]  Office  [phone]  CC: Primary care physician; Inc, [employer] ServicesPatient ID: Kyle White, male   DOB: Oct 30, 1960, 58 y.o.   MRN: [id number]

## 2018-12-09 NOTE — Progress Notes (Signed)
[facility], [place] 12/09/18  Subjective:   LOS: 23 09/19 0701 - 09/20 0700 In: 3159.8 [I.V.:949.9; NG/GT:1880; IV Piggyback:299.9] Out: 2285 [Urine:1785; Stool:500] Critically ill Vent assisted. Fio2 30 % Sedation - fentanyl Continues to have large amount of edema   Did not tolerated HD last treatment - caused hemodynamic instablity Heart rate continues to be irregular and 1 20-1 40 range Urine output 1785 cc Appears to be increasing with IV albumin infusion Sister at bedside  Objective:  Vital signs in last 24 hours:  Temp:  [97.6 F (36.4 C)-98.9 F (37.2 C)] 97.9 F (36.6 C) (09/20 1100) Pulse Rate:  [99-155] 128 (09/20 1100) Resp:  [22-29] 29 (09/20 1100) BP: (103-161)/(67-117) 123/79 (09/20 1100) SpO2:  [91 %-99 %] 91 % (09/20 1100) FiO2 (%):  [30 %] 30 % (09/20 1118)  Weight change:  Filed Weights   12/06/18 0500 12/07/18 0500 12/08/18 0500  Weight: (!) 224 kg (!) 227 kg (!) 224 kg    Intake/Output:    Intake/Output Summary (Last 24 hours) at 12/09/2018 1302 Last data filed at 12/09/2018 1229 Gross per 24 hour  Intake 3350.38 ml  Output 2135 ml  Net 1215.38 ml     Physical Exam: General: critically ill apearing  HEENT  NGT in place, conjunctival edema  Pulm/lungs Vent assisted, Tracheostomy;  placed 12/10/2018,  CVS/Heart Irregular, tachycardic  Abdomen:  Obese, dependent edema  Extremities: SCDs, + dependent edema  Neurologic: Sedated, did not respond to voice or touch  Skin: warm  Access: Rt femoral dialysis cathter in place   Rectal tube, Foley in place    Basic Metabolic Panel:  Recent Labs  Lab 12/04/18 0438 11/21/2018 0413 12/06/18 0503 12/07/18 0505 12/08/18 0615 12/09/18 0446  NA 149* 147* 150* 148* 148* 149*  K 3.1* 3.5 3.6 3.5 3.5 3.0*  CL 111 111 115* 114* 114* 114*  CO2 22 24 23 23 23 24   GLUCOSE 181* 240* 174* 182* 191* 181*  BUN 144* 141* 123* 118* 136* 121*  CREATININE 3.38* 3.29* 2.92* 2.55*  2.80* 2.49*  CALCIUM 8.0* 7.9* 8.1* 7.9* 7.8* 8.2*  MG 2.9*  --   --   --  2.5*  --   PHOS 6.2*  --   --   --  5.9* 4.8*     CBC: Recent Labs  Lab 12/03/18 0435  11/23/2018 0413 11/25/2018 1630 12/06/18 0503 12/07/18 0505 12/08/18 0615 12/09/18 0446  WBC 8.4   < > 4.3  --  3.9* 4.1 3.2* 3.3*  NEUTROABS 7.2  --   --   --   --  3.3 2.6  --   HGB 10.6*   < > 9.5*  --  9.3* 9.0* 8.2* 8.8*  HCT 32.3*   < > 30.6*  --  29.9* 28.5* 26.2* 28.4*  MCV 93.4   < > 95.3  --  95.8 95.0 96.0 95.0  PLT 57*   < > 47* 57* 54* 51* 45* 51*   < > = values in this interval not displayed.      Lab Results  Component Value Date   HEPBSAG Negative 11/29/2018   HEPBSAB Non Reactive 11/29/2018   HEPBIGM Negative 11/29/2018      Microbiology:  Recent Results (from the past 240 hour(s))  Culture, respiratory (non-expectorated)     Status: None   Collection Time: 12/02/18  8:45 AM   Specimen: Tracheal Aspirate; Respiratory  Result Value Ref Range Status   Specimen Description   Final  TRACHEAL ASPIRATE Performed at [facility], [address], [place], [place] [address]    Special Requests   Final    NONE Performed at [facility], [address] [place] [place], [place] [address]    Gram Stain   Final    NO WBC SEEN NO ORGANISMS SEEN Performed at [facility], [place] [address], [place], [place] [address]    Culture FEW CANDIDA ALBICANS  Final   Report Status 12/04/2018 FINAL  Final  Urine Culture     Status: None   Collection Time: 12/07/18  3:16 PM   Specimen: Urine, Random  Result Value Ref Range Status   Specimen Description   Final    URINE, RANDOM Performed at [facility], [address], [place], [place] [address]    Special Requests   Final    NONE Performed at [facility], [address] [place], [place] [address]    Culture   Final    NO GROWTH Performed at [facility], [place] [address] [place], [place] [address]     Report Status 12/08/2018 FINAL  Final  Culture, respiratory (non-expectorated)     Status: None (Preliminary result)   Collection Time: 12/07/18  3:24 PM   Specimen: Tracheal Aspirate; Respiratory  Result Value Ref Range Status   Specimen Description   Final    TRACHEAL ASPIRATE Performed at [facility], [address] [place] [place], [place] [address]    Special Requests   Final    NONE Performed at [facility], [address] [place], [place] [address]    Gram Stain   Final    FEW WBC PRESENT, PREDOMINANTLY PMN RARE YEAST RARE GRAM POSITIVE COCCI IN CLUSTERS Performed at [facility], [place] [address], [place], [place] [address]    Culture FEW YEAST  Final   Report Status PENDING  Incomplete  CULTURE, BLOOD (ROUTINE X 2) w Reflex to ID Panel     Status: None (Preliminary result)   Collection Time: 12/07/18  3:27 PM   Specimen: BLOOD  Result Value Ref Range Status   Specimen Description BLOOD PORT  Final   Special Requests   Final    BOTTLES DRAWN AEROBIC AND ANAEROBIC Blood Culture adequate volume   Culture   Final    NO GROWTH 2 DAYS Performed at [facility], [address] [place], [place] [address]    Report Status PENDING  Incomplete  CULTURE, BLOOD (ROUTINE X 2) w Reflex to ID Panel     Status: None (Preliminary result)   Collection Time: 12/07/18  3:30 PM   Specimen: BLOOD  Result Value Ref Range Status   Specimen Description BLOOD RT HAND  Final   Special Requests   Final    BOTTLES DRAWN AEROBIC AND ANAEROBIC Blood Culture adequate volume   Culture   Final    NO GROWTH 2 DAYS Performed at [facility], [address], [place], [place] [address]    Report Status PENDING  Incomplete    Coagulation Studies: No results for input(s): LABPROT, INR in the last 72 hours.  Urinalysis: Recent Labs    12/07/18 1516  COLORURINE YELLOW*  LABSPEC 1.015  PHURINE 5.0  GLUCOSEU NEGATIVE  HGBUR LARGE*  BILIRUBINUR NEGATIVE   KETONESUR NEGATIVE  PROTEINUR 30*  NITRITE NEGATIVE  LEUKOCYTESUR NEGATIVE      Imaging: Dg Chest Port 1 View  Result Date: 12/07/2018 CLINICAL DATA:  Respiratory failure EXAM:  PORTABLE CHEST 1 VIEW COMPARISON:  December 03, 2018 FINDINGS: Limited due to patient position. Endotracheal tube tip is seen 4 cm above the carina. NG tube is seen coursing below the diaphragm. A right-sided central venous catheter seen at the superior cavoatrial junction. Hazy ground-glass and interstitial opacities seen throughout both lungs. There is interval improvement in the right lung aeration. Again noted is cardiomegaly and a probable small bilateral pleural effusions. IMPRESSION: 1. Support lines and tubes in satisfactory position. 2. Interval improvement in the right lung aeration. 3. Diffuse hazy ground-glass and interstitial opacities , which could be due to infection/atelectasis. 4. Small bilateral pleural effusions Electronically Signed   By: Prudencio Pair M.D.   On: 12/07/2018 15:23     Medications:   . sodium chloride Stopped (12/09/18 0343)  . albumin human 60 mL/hr at 12/09/18 1100  . amiodarone 60 mg/hr (12/09/18 1229)  . anticoagulant sodium citrate    . dexmedetomidine (PRECEDEX) IV infusion Stopped (12/07/18 1303)  . feeding supplement (VITAL HIGH PROTEIN) 1,000 mL (12/08/18 1651)  . fentaNYL infusion INTRAVENOUS 25 mcg/hr (12/09/18 1100)  . meropenem (MERREM) IV Stopped (12/09/18 0933)  . phenylephrine (NEO-SYNEPHRINE) Adult infusion Stopped (12/08/18 0430)   . chlorhexidine gluconate (MEDLINE KIT)  15 mL Mouth Rinse BID  . Chlorhexidine Gluconate Cloth  6 each Topical Daily  . clonazePAM  0.5 mg Oral BID  . feeding supplement (PRO-STAT SUGAR FREE 64)  60 mL Per Tube TID  . free water  200 mL Per Tube Q4H  . insulin aspart  0-20 Units Subcutaneous Q4H  . insulin aspart  4 Units Subcutaneous Q4H  . insulin glargine  40 Units Subcutaneous Daily  . ipratropium-albuterol  3 mL  Nebulization Q6H  . mouth rinse  15 mL Mouth Rinse 10 times per day  . multivitamin  15 mL Per Tube Daily  . pantoprazole sodium  40 mg Per Tube QHS  . QUEtiapine  25 mg Per Tube QHS  . QUEtiapine  50 mg Per Tube QHS  . sodium chloride flush  10-40 mL Intracatheter Q12H   sodium chloride, acetaminophen, anticoagulant sodium citrate, bisacodyl, fentaNYL, metoprolol tartrate, midazolam, sodium chloride flush  Assessment/ Plan:  58 y.o. male with with atrial fibrillation on warfarin, congestive heart failure, diabetes mellitus type 2, obstructive sleep apnea,morbid obesity, gout, mitral valve replacementwho was admitted to [facility] on8[facility]28[facility]2020for acute exacerbation of COPD  Active Problems:   Acute exacerbation of CHF (congestive heart failure) ([place])   Acute respiratory failure with hypoxia and hypercapnia ([facility])   Goals of care, counseling/discussion   Palliative care by specialist   DNR (do not resuscitate) discussion   COPD exacerbation ([place])   #. ARF on CKD st3 Recent Labs    12/06/18 0503 12/07/18 0505 12/08/18 0615 12/09/18 0446  CREATININE 2.92* 2.55* 2.80* 2.49*  Acute renal failure secondary to ATN from multiorgan failure S Creatinine and BUN remain critically elevated  UOP about 1700 cc - did not tolerate HD 9/17. Will monitor for now  - Avoid nephrotoxins such as NSAIDs, iv contrast, Fleets enema -Potassium hemoglobin, BUN, creatinine.  To be improving some continue IV albumin for oncotic support.  No acute indication for dialysis at present.  We will follow closely.  # Hypokalemia,  - replace prn   # hypernatremia - agree with free water replacement  #. Anemia    Lab Results  Component Value Date   HGB 8.8 (L) 12/09/2018   # Acute resp failure - Vent dependent, now has tracheostomy -  Fio2 30%    # generalized Edema - 2 D echo from 8/29 shows LVEF 55-60% (nomral).  - Abnormal severe thickening of aortic wall Tricuspid valve and Rt vent were not  assesed - Amiodarone for A Fib -add albumin for oncotic support   #. Diabetes type 2 with CKD Hgb A1c MFr Bld (%)  Date Value  11/17/2018 7.3 (H)        LOS: [place] 9/20/20201:02 [place], [place]

## 2018-12-09 NOTE — Progress Notes (Signed)
Follow up - Critical Care Medicine Note  Patient Details:    Kyle White is an 58 y.o. male with hx of type 2 diabetes, chronic AF, pickwickian syndrome, hypertension, congestive heart failure adm 8/28 via ED to ICU/SDU with acute on chronic respiratory failure felt to be due to CHF/pulmonary edema.  Has required intubation and mechanical ventilation and because of failure to be liberated from the vent now has tracheostomy in place.  Lines, Airways, Drains: CVC Triple Lumen 11/22/18 Right Internal jugular (Active)  Indication for Insertion or Continuance of Line Poor Vasculature-patient has had multiple peripheral attempts or PIVs lasting less than 24 hours 12/07/18 0750  Site Assessment Clean;Dry;Intact 12/07/18 0750  Proximal Lumen Status Infusing;Flushed;Blood return noted 12/07/18 0750  Medial Lumen Status Infusing;Flushed;Blood return noted 12/07/18 0750  Distal Lumen Status Infusing;Flushed;Blood return noted 12/07/18 0750  Dressing Type Transparent 12/07/18 0750  Dressing Status Clean;Dry;Intact;Antimicrobial disc in place 12/07/18 [place] checked and tightened 12/07/18 0750  Dressing Intervention New dressing;Dressing changed;Antimicrobial disc changed 12/06/18 0300  Dressing Change Due 12/13/18 12/07/18 0750     NG/OG Tube Nasogastric Right nare  Documented cm marking at nare/ corner of mouth (Active)  Cm Marking at Nare/Corner of Mouth (if applicable) 80 cm 67/67/20 1600  Site Assessment Clean;Dry;Intact 12/07/18 1600  Ongoing Placement Verification No change in respiratory status;No acute changes, not attributed to clinical condition;Xray 12/07/18 1600  Status Infusing tube feed 12/07/18 1600  Amount of suction 15 mmHg 12/06/18 1200  Drainage Appearance None 12/06/18 0758  Intake (mL) 200 mL 12/06/18 2200     Rectal Tube/Pouch (Active)  Date Prophylactic Dressing Applied (if applicable) 94/70/96 28/36/62 1600  Output (mL) 200 mL 12/06/18 1828  Intake  (mL) 50 mL 12/07/18 0800     Urethral Catheter [name], RN Double-lumen 16 Fr. (Active)  Indication for Insertion or Continuance of Catheter Therapy based on hourly urine output monitoring and documentation for critical condition (NOT STRICT I&O) 12/07/18 0400  Site Assessment Intact;Clean;Dry 12/07/18 0751  Catheter Maintenance Bag below level of bladder;Catheter secured;Drainage bag/tubing not touching floor;Insertion date on drainage bag;No dependent loops;Seal intact;Bag emptied prior to transport 12/07/18 0751  Collection Container Standard drainage bag 12/07/18 0751  Securement Method Securing device (Describe) 12/07/18 0751  Urinary Catheter Interventions (if applicable) Unclamped 94/76/54 0751  Input (mL) 0 mL 12/02/18 2000  Output (mL) 188 mL 12/07/18 1550    Anti-infectives:  Anti-infectives (From admission, onward)   Start     Dose/Rate Route Frequency Ordered Stop   12/08/18 1600  meropenem (MERREM) 1 g in sodium chloride 0.9 % 100 mL IVPB     1 g 200 mL/hr over 30 Minutes Intravenous Every 8 hours 12/08/18 1517     12/08/18 0600  meropenem (MERREM) 1 g in sodium chloride 0.9 % 100 mL IVPB  Status:  Discontinued     1 g 200 mL/hr over 30 Minutes Intravenous Every 12 hours 12/07/18 2059 12/08/18 1517   12/07/18 1800  meropenem (MERREM) 1 g in sodium chloride 0.9 % 100 mL IVPB  Status:  Discontinued     1 g 200 mL/hr over 30 Minutes Intravenous Every 8 hours 12/07/18 1716 12/07/18 2059   12/07/18 1715  vancomycin (VANCOCIN) IVPB 750 mg/150 ml premix  Status:  Discontinued     750 mg 150 mL/hr over 60 Minutes Intravenous  Once 12/07/18 1706 12/07/18 1721   12/07/18 1500  vancomycin (VANCOCIN) 1,250 mg in sodium chloride 0.9 % 250 mL IVPB  1,250 mg 166.7 mL/hr over 90 Minutes Intravenous  Once 12/07/18 1403 12/08/18 0934   12/06/18 1400  vancomycin (VANCOCIN) 1,250 mg in sodium chloride 0.9 % 250 mL IVPB  Status:  Discontinued     1,250 mg 166.7 mL/hr over 90  Minutes Intravenous  Once 12/06/18 1240 12/06/18 1420   12/18/2018 1500  vancomycin (VANCOCIN) 1,250 mg in sodium chloride 0.9 % 250 mL IVPB     1,250 mg 166.7 mL/hr over 90 Minutes Intravenous  Once 12/07/2018 1411 11/28/2018 1640   12/03/18 1513  vancomycin variable dose per unstable renal function (pharmacist dosing)  Status:  Discontinued      Does not apply See admin instructions 12/03/18 1514 12/07/18 1527   12/03/18 1015  vancomycin (VANCOCIN) 2,000 mg in sodium chloride 0.9 % 500 mL IVPB     2,000 mg 250 mL/hr over 120 Minutes Intravenous  Once 12/03/18 1009 12/03/18 1346   12/02/18 0945  piperacillin-tazobactam (ZOSYN) IVPB 3.375 g  Status:  Discontinued     3.375 g 12.5 mL/hr over 240 Minutes Intravenous Every 8 hours 12/02/18 0938 12/03/18 1009   11/30/18 1800  meropenem (MERREM) 500 mg in sodium chloride 0.9 % 100 mL IVPB  Status:  Discontinued     500 mg 200 mL/hr over 30 Minutes Intravenous Daily-1800 11/29/18 1500 11/30/18 0901   11/30/18 1000  cefTRIAXone (ROCEPHIN) 2 g in sodium chloride 0.9 % 100 mL IVPB  Status:  Discontinued     2 g 200 mL/hr over 30 Minutes Intravenous Every 24 hours 11/30/18 0852 12/02/18 0934   11/28/18 1530  vancomycin (VANCOCIN) 1,250 mg in sodium chloride 0.9 % 250 mL IVPB  Status:  Discontinued     1,250 mg 166.7 mL/hr over 90 Minutes Intravenous Every 24 hours 11/28/18 1510 11/29/18 1553   11/28/18 1500  vancomycin (VANCOCIN) 1,250 mg in sodium chloride 0.9 % 250 mL IVPB  Status:  Discontinued     1,250 mg 166.7 mL/hr over 90 Minutes Intravenous Every 24 hours 11/28/18 1458 11/28/18 1510   11/28/18 1215  meropenem (MERREM) 1 g in sodium chloride 0.9 % 100 mL IVPB  Status:  Discontinued     1 g 200 mL/hr over 30 Minutes Intravenous Every 12 hours 11/28/18 1206 11/29/18 1500   11/27/18 1500  vancomycin (VANCOCIN) 1,750 mg in sodium chloride 0.9 % 500 mL IVPB  Status:  Discontinued     1,750 mg 250 mL/hr over 120 Minutes Intravenous Every 24 hours  11/27/18 1355 11/28/18 0726   11/26/18 1100  vancomycin (VANCOCIN) 2,500 mg in sodium chloride 0.9 % 500 mL IVPB     2,500 mg 250 mL/hr over 120 Minutes Intravenous  Once 11/26/18 1040 11/26/18 1315   11/26/18 1058  vancomycin variable dose per unstable renal function (pharmacist dosing)  Status:  Discontinued      Does not apply See admin instructions 11/26/18 1058 11/27/18 1658   11/22/18 1015  ceFEPIme (MAXIPIME) 2 g in sodium chloride 0.9 % 100 mL IVPB  Status:  Discontinued     2 g 200 mL/hr over 30 Minutes Intravenous Every 8 hours 11/22/18 1009 11/27/18 1027      Microbiology: Results for orders placed or performed during the hospital encounter of 10/21/2018  SARS CORONAVIRUS 2 (TAT 6-12 HRS) Nasal Swab Aptima Multi Swab     Status: None   Collection Time: 10/20/2018  4:55 PM   Specimen: Aptima Multi Swab; Nasal Swab  Result Value Ref Range Status   SARS Coronavirus 2  NEGATIVE NEGATIVE Final    Comment: (NOTE) SARS-CoV-2 target nucleic acids are NOT DETECTED. The SARS-CoV-2 RNA is generally detectable in upper and lower respiratory specimens during the acute phase of infection. Negative results do not preclude SARS-CoV-2 infection, do not rule out co-infections with other pathogens, and should not be used as the sole basis for treatment or other patient management decisions. Negative results must be combined with clinical observations, patient history, and epidemiological information. The expected result is Negative. Fact Sheet for Patients: SugarRoll.be Fact Sheet for Healthcare Providers: https://www.woods-mathews.com/ This test is not yet approved or cleared by the [place] FDA and  has been authorized for detection and/or diagnosis of SARS-CoV-2 by FDA under an Emergency Use Authorization (EUA). This EUA will remain  in effect (meaning this test can be used) for the duration of the COVID-19 declaration under Section 56 4(b)(1)  of the Act, 21 U.S.C. section 360bbb-3(b)(1), unless the authorization is terminated or revoked sooner. Performed at [facility], [place] [address], [place], [place] [address]   SARS Coronavirus 2 [facility] order, Performed in [facility] hospital lab) Nasopharyngeal Nasopharyngeal Swab     Status: None   Collection Time: 11/08/2018 10:34 PM   Specimen: Nasopharyngeal Swab  Result Value Ref Range Status   SARS Coronavirus 2 NEGATIVE NEGATIVE Final    Comment: (NOTE) If result is NEGATIVE SARS-CoV-2 target nucleic acids are NOT DETECTED. The SARS-CoV-2 RNA is generally detectable in upper and lower  respiratory specimens during the acute phase of infection. The lowest  concentration of SARS-CoV-2 viral copies this assay can detect is 250  copies / mL. A negative result does not preclude SARS-CoV-2 infection  and should not be used as the sole basis for treatment or other  patient management decisions.  A negative result may occur with  improper specimen collection / handling, submission of specimen other  than nasopharyngeal swab, presence of viral mutation(s) within the  areas targeted by this assay, and inadequate number of viral copies  (<250 copies / mL). A negative result must be combined with clinical  observations, patient history, and epidemiological information. If result is POSITIVE SARS-CoV-2 target nucleic acids are DETECTED. The SARS-CoV-2 RNA is generally detectable in upper and lower  respiratory specimens dur ing the acute phase of infection.  Positive  results are indicative of active infection with SARS-CoV-2.  Clinical  correlation with patient history and other diagnostic information is  necessary to determine patient infection status.  Positive results do  not rule out bacterial infection or co-infection with other viruses. If result is PRESUMPTIVE POSTIVE SARS-CoV-2 nucleic acids MAY BE PRESENT.   A presumptive positive result was obtained on the submitted  specimen  and confirmed on repeat testing.  While 2019 novel coronavirus  (SARS-CoV-2) nucleic acids may be present in the submitted sample  additional confirmatory testing may be necessary for epidemiological  and / or clinical management purposes  to differentiate between  SARS-CoV-2 and other Sarbecovirus currently known to infect humans.  If clinically indicated additional testing with an alternate test  methodology [id number]) is advised. The SARS-CoV-2 RNA is generally  detectable in upper and lower respiratory sp ecimens during the acute  phase of infection. The expected result is Negative. Fact Sheet for Patients:  StrictlyIdeas.no Fact Sheet for Healthcare Providers: BankingDealers.co.za This test is not yet approved or cleared by the [place] FDA and has been authorized for detection and/or diagnosis of SARS-CoV-2 by FDA under an Emergency Use Authorization (EUA).  This EUA will remain in effect (meaning this test can be used) for the duration of the COVID-19 declaration under Section 564(b)(1) of the Act, 21 U.S.C. section 360bbb-3(b)(1), unless the authorization is terminated or revoked sooner. Performed at [facility], [address] [place], [place] [address]   Culture, respiratory (non-expectorated)     Status: None   Collection Time: 11/17/2018 10:34 PM   Specimen: Tracheal Aspirate; Respiratory  Result Value Ref Range Status   Specimen Description   Final    TRACHEAL ASPIRATE Performed at [facility], [address] [place], [place] [address]    Special Requests   Final    NONE Performed at [facility], [address] [place], [place] [address]    Gram Stain   Final    RARE WBC PRESENT, PREDOMINANTLY PMN ABUNDANT GRAM POSITIVE COCCI IN CHAINS    Culture   Final    Consistent with normal respiratory flora. Performed at [facility], [place] [address], [place], [place]  [address]    Report Status 11/19/2018 FINAL  Final  Urine Culture     Status: None   Collection Time: 11/10/2018 11:12 PM   Specimen: Urine, Random  Result Value Ref Range Status   Specimen Description   Final    URINE, RANDOM Performed at [facility], [address], [place], [place] [address]    Special Requests   Final    NONE Performed at [facility], [address], [place], [place] [address]    Culture   Final    NO GROWTH Performed at [facility], [place] [address], [place], [place] [address]    Report Status 11/18/2018 FINAL  Final  MRSA PCR Screening     Status: None   Collection Time: 11/17/2018 11:28 PM   Specimen: Nasopharyngeal  Result Value Ref Range Status   MRSA by PCR NEGATIVE NEGATIVE Final    Comment:        The GeneXpert MRSA Assay (FDA approved for NASAL specimens only), is one component of a comprehensive MRSA colonization surveillance program. It is not intended to diagnose MRSA infection nor to guide or monitor treatment for MRSA infections. Performed at [facility], [address] [place], [place] [address]   Culture, blood (Routine X 2) w Reflex to ID Panel     Status: None   Collection Time: 11/17/18 12:19 AM   Specimen: BLOOD  Result Value Ref Range Status   Specimen Description BLOOD RIGHT ANTECUBITAL  Final   Special Requests   Final    BOTTLES DRAWN AEROBIC AND ANAEROBIC Blood Culture adequate volume   Culture   Final    NO GROWTH 5 DAYS Performed at [facility], [address], [place], [place] [address]    Report Status 11/22/2018 FINAL  Final  Culture, blood (Routine X 2) w Reflex to ID Panel     Status: None   Collection Time: 11/17/18 12:31 AM   Specimen: BLOOD  Result Value Ref Range Status   Specimen Description BLOOD CENTRAL LINE MIDLINE  Final   Special Requests   Final    BOTTLES DRAWN AEROBIC AND ANAEROBIC Blood Culture adequate volume   Culture   Final    NO GROWTH 5  DAYS Performed at [facility], [address] [place], [place] [address]    Report Status 11/22/2018 FINAL  Final  Culture, bal-quantitative     Status: Abnormal   Collection Time: 11/22/18  7:48 AM   Specimen: Bronchoalveolar Lavage; Respiratory  Result Value Ref Range Status   Specimen Description   Final    BRONCHIAL ALVEOLAR LAVAGE Performed at [facility], [address] [place], [place] [address]    Special Requests   Final    Immunocompromised Performed at [facility], [place], [place] [address]    Gram Stain   Final    FEW WBC PRESENT, PREDOMINANTLY PMN FEW GRAM POSITIVE COCCI IN PAIRS IN CLUSTERS FEW GRAM POSITIVE RODS Performed at [facility], [place] [address], [place], [place] [address]    Culture (A)  Final    >=100,000 COLONIES/mL STAPHYLOCOCCUS SIMULANS >=100,000 COLONIES/mL STREPTOCOCCUS MITIS/ORALIS    Report Status 11/25/2018 FINAL  Final   Organism ID, Bacteria STAPHYLOCOCCUS SIMULANS (A)  Final   Organism ID, Bacteria STREPTOCOCCUS MITIS/ORALIS (A)  Final      Susceptibility   Staphylococcus simulans - MIC*    CIPROFLOXACIN <=0.5 SENSITIVE Sensitive     ERYTHROMYCIN <=0.25 SENSITIVE Sensitive     GENTAMICIN <=0.5 SENSITIVE Sensitive     OXACILLIN <=0.25 SENSITIVE Sensitive     TETRACYCLINE >=16 RESISTANT Resistant     VANCOMYCIN <=0.5 SENSITIVE Sensitive     TRIMETH/SULFA <=10 SENSITIVE Sensitive     CLINDAMYCIN <=0.25 SENSITIVE Sensitive     RIFAMPIN <=0.5 SENSITIVE Sensitive     Inducible Clindamycin NEGATIVE Sensitive     * >=100,000 COLONIES/mL STAPHYLOCOCCUS SIMULANS   Streptococcus mitis/oralis - MIC*    TETRACYCLINE >=16 RESISTANT Resistant     VANCOMYCIN 0.25 SENSITIVE Sensitive     CLINDAMYCIN 0.5 INTERMEDIATE Intermediate     * >=100,000 COLONIES/mL STREPTOCOCCUS MITIS/ORALIS  CULTURE, BLOOD (ROUTINE X 2) w Reflex to ID Panel     Status: None   Collection Time: 11/26/18 10:32 PM    Specimen: BLOOD  Result Value Ref Range Status   Specimen Description BLOOD RIGHT ANTECUBITAL  Final   Special Requests   Final    BOTTLES DRAWN AEROBIC AND ANAEROBIC Blood Culture adequate volume   Culture   Final    NO GROWTH 5 DAYS Performed at [facility], [address] [place], [place] [address]    Report Status 12/01/2018 FINAL  Final  CULTURE, BLOOD (ROUTINE X 2) w Reflex to ID Panel     Status: None   Collection Time: 11/26/18 10:32 PM   Specimen: BLOOD  Result Value Ref Range Status   Specimen Description BLOOD BLOOD RIGHT HAND  Final   Special Requests   Final    BOTTLES DRAWN AEROBIC AND ANAEROBIC Blood Culture adequate volume   Culture   Final    NO GROWTH 5 DAYS Performed at [facility], [address] [place], [place] [address]    Report Status 12/01/2018 FINAL  Final  Respiratory Panel by PCR     Status: None   Collection Time: 11/28/18 10:36 AM   Specimen: Nasopharyngeal Swab; Respiratory  Result Value Ref Range Status   Adenovirus NOT DETECTED NOT DETECTED Final   Coronavirus 229E NOT DETECTED NOT DETECTED Final    Comment: (NOTE) The Coronavirus on the Respiratory Panel, DOES NOT test for the novel  Coronavirus (2019 nCoV)    Coronavirus HKU1 NOT DETECTED NOT DETECTED Final   Coronavirus NL63 NOT DETECTED NOT DETECTED Final   Coronavirus OC43 NOT DETECTED NOT DETECTED Final   Metapneumovirus NOT DETECTED NOT DETECTED Final   Rhinovirus / Enterovirus NOT DETECTED NOT DETECTED Final   Influenza  A NOT DETECTED NOT DETECTED Final   Influenza B NOT DETECTED NOT DETECTED Final   Parainfluenza Virus 1 NOT DETECTED NOT DETECTED Final   Parainfluenza Virus 2 NOT DETECTED NOT DETECTED Final   Parainfluenza Virus 3 NOT DETECTED NOT DETECTED Final   Parainfluenza Virus 4 NOT DETECTED NOT DETECTED Final   Respiratory Syncytial Virus NOT DETECTED NOT DETECTED Final   Bordetella pertussis NOT DETECTED NOT DETECTED Final   Chlamydophila  pneumoniae NOT DETECTED NOT DETECTED Final   Mycoplasma pneumoniae NOT DETECTED NOT DETECTED Final    Comment: Performed at [facility], [place] [address] [place], [place] [address]  Culture, respiratory (non-expectorated)     Status: None   Collection Time: 11/28/18 11:29 AM   Specimen: Tracheal Aspirate; Respiratory  Result Value Ref Range Status   Specimen Description   Final    TRACHEAL ASPIRATE Performed at [facility], [address] [place], [place] [address]    Special Requests   Final    NONE Performed at [facility], [address] [place], [place] [address]    Gram Stain   Final    FEW WBC PRESENT, PREDOMINANTLY PMN FEW SQUAMOUS EPITHELIAL CELLS PRESENT RARE GRAM POSITIVE COCCI IN PAIRS    Culture   Final    FEW Consistent with normal respiratory flora. Performed at [facility], [place] [address], [place], [place] [address]    Report Status 11/30/2018 FINAL  Final  Culture, respiratory (non-expectorated)     Status: None   Collection Time: 12/02/18  8:45 AM   Specimen: Tracheal Aspirate; Respiratory  Result Value Ref Range Status   Specimen Description   Final    TRACHEAL ASPIRATE Performed at [facility], [address], [place], [place] [address]    Special Requests   Final    NONE Performed at [facility], [address] [place], [place] [address]    Gram Stain   Final    NO WBC SEEN NO ORGANISMS SEEN Performed at [facility], [place] [address] [place], [place] [address]    Culture FEW CANDIDA ALBICANS  Final   Report Status 12/04/2018 FINAL  Final  Urine Culture     Status: None   Collection Time: 12/07/18  3:16 PM   Specimen: Urine, Random  Result Value Ref Range Status   Specimen Description   Final    URINE, RANDOM Performed at [facility], [address], [place], [place] [address]    Special Requests   Final    NONE Performed at [facility], [address] [place], [place] [address]    Culture   Final    NO GROWTH Performed at [facility], [place] [address], [place], [place] [address]    Report Status 12/08/2018 FINAL  Final  Culture, respiratory (non-expectorated)     Status: None   Collection Time: 12/07/18  3:24 PM   Specimen: Tracheal Aspirate; Respiratory  Result Value Ref Range Status   Specimen Description   Final    TRACHEAL ASPIRATE Performed at [facility], [address] [place], [place] [address]    Special Requests   Final    NONE Performed at [facility], [address] [place],  [address]    Gram Stain   Final    FEW WBC PRESENT, PREDOMINANTLY PMN RARE YEAST RARE GRAM POSITIVE COCCI IN CLUSTERS Performed at [facility], [place] [address],  [place], [place] [address]    Culture FEW CANDIDA ALBICANS  Final   Report Status 12/09/2018 FINAL  Final  CULTURE, BLOOD (ROUTINE X 2) w Reflex to ID Panel     Status: None (Preliminary result)   Collection Time: 12/07/18  3:27 PM   Specimen: BLOOD  Result Value Ref Range Status   Specimen Description BLOOD PORT  Final   Special Requests   Final    BOTTLES DRAWN AEROBIC AND ANAEROBIC Blood Culture adequate volume   Culture   Final    NO GROWTH 2 DAYS Performed at [facility], [address], [place], [place] [address]    Report Status PENDING  Incomplete  CULTURE, BLOOD (ROUTINE X 2) w Reflex to ID Panel     Status: None (Preliminary result)   Collection Time: 12/07/18  3:30 PM   Specimen: BLOOD  Result Value Ref Range Status   Specimen Description BLOOD RT HAND  Final   Special Requests   Final    BOTTLES DRAWN AEROBIC AND ANAEROBIC Blood Culture adequate volume   Culture   Final    NO GROWTH 2 DAYS Performed at [facility], [address] [place], [place] [address]    Report Status PENDING  Incomplete    Best Practice/Protocols:  VTE Prophylaxis: Mechanical GI Prophylaxis: Proton Pump Inhibitor   Intermittent Sedation Hyperglycemia (ICU)  Events: 08/28 admitted to ICU/SDU for BiPAP. PCCM consultation 08/28 emergently intubated 08/29 Echocardiogram: EF 55-60%. LV cavity mildly dilated. Diastolic function could not be evaluated. Left atrium mildly dilated. RV not assessed. RA size not well visualized. 08/29 CTAP: No acute intra-abdominal process noted. No perforation. Trace ascites. Severe hepatic steatosis. Cholelithiasis. Small R and trace L pleural effusions. Bilateral lower lobe airspace disease may represent atelectasis but pneumonia is difficult to exclude, particularly on the R. 08/30 Extubated 08/30 Cardiology consultation: recommended diltiazem gtt for rapid AF and continue anticoagulation 08/31 reintubated emergently 08/31 bronchoscopy for mucous plugging and complete collapse of R lung 08/31 Palliative Care consultation to address goals of care 09/03 DNR established 09/05 Nephrology consultation for AKI 09/06 Renal MB:[id number] appearance of the kidneys 09/06 requiring 100% FiO2 and PEEP 15 cm H2O 09/07 ETT cuff leak, ETT changed 09/08 CTH: No acute intracranial abnormality seen 09/10 HD cath placed and intermittent HD initiated 09/11 + F/C. Improving vent requirements. 2 hrs of HD performed 09/12 improving vent requirements. Low-dose clonazepam and Duragesic patch initiated. Goals of care discussion with patient's sister, consent for tracheostomy tube. 09/13 Severe desaturation with increased purulent respiratory secretions and volume loss due to mucus plugging.Respiratory culture obtained. Antibiotics expanded to piperacillin-tazobactam. DNR confirmed with patient's sister. Continuous sedation infusions initiated 9/14 ETT dislodged, s/p bronch to adjust ETT, extubated and re-intubated Patient has tracheomegaly, plan for trach 9/15 remains on high fio2 9/16 s/p trach 9/17 did not tolerate dialysis due to hemodynamic instability.  Persistent shock  physiology on pressors 9/18 re-spiked temperatures, culture sputum, Merrem restarted  Studies: Ct Abdomen Pelvis Wo Contrast  Result Date: 11/17/2018 CLINICAL DATA:  Abdominal rigidity. Concern for perforated viscus. Currently intubated. EXAM: CT ABDOMEN AND PELVIS WITHOUT CONTRAST TECHNIQUE: Multidetector CT imaging of the abdomen and pelvis was performed following the standard protocol without IV contrast. COMPARISON:  None. FINDINGS: Lower chest: Small right and trace left pleural effusions with bilateral lower lobe airspace disease. Small amount of herniated right middle lobe between the fifth and sixth ribs. Hepatobiliary: Severe hepatic steatosis. Several small gallstones. No gallbladder wall thickening or biliary dilatation. Pancreas:  Unremarkable. No pancreatic ductal dilatation or surrounding inflammatory changes. Spleen: Normal in size without focal abnormality. Adrenals/Urinary Tract: Adrenal glands are unremarkable. Punctate calculus in the lower pole the left kidney. No hydronephrosis. Bladder is decompressed by Foley catheter. Stomach/Bowel: Enteric tube tip in the distal stomach. Stomach is within normal limits. Appendix appears normal. No evidence of bowel wall thickening, distention, or inflammatory changes. Vascular/Lymphatic: Aortic atherosclerosis. No enlarged abdominal or pelvic lymph nodes. Reproductive: Prostate is unremarkable. Other: Trace ascites.  No pneumoperitoneum. Musculoskeletal: No acute or significant osseous findings. IMPRESSION: 1.  No acute intra-abdominal process.  No perforation. 2. Trace ascites. 3. Severe hepatic steatosis. 4. Cholelithiasis. 5. Punctate nonobstructive left nephrolithiasis. 6. Small right and trace left pleural effusions. Bilateral lower lobe airspace disease may represent atelectasis, but pneumonia is difficult to exclude, particularly on the right. Electronically Signed   By: Titus Dubin M.D.   On: 11/17/2018 13:50   Dg Abd 1 View  Result  Date: 12/03/2018 CLINICAL DATA:  Check gastric catheter placement EXAM: ABDOMEN - 1 VIEW COMPARISON:  Film from earlier in the same day. FINDINGS: Gastric catheter has been advanced several cm and now lies with the tip in the distal stomach. IMPRESSION: Gastric catheter within the stomach. Electronically Signed   By: Inez Catalina M.D.   On: 12/03/2018 20:50   Dg Abd 1 View  Result Date: 12/03/2018 CLINICAL DATA:  Check gastric catheter placement EXAM: ABDOMEN - 1 VIEW COMPARISON:  11/30/2018 FINDINGS: Gastric catheter is noted within the distal esophagus but does not appear to have reached the gastric lumen. Scattered large and small bowel gas is noted. IMPRESSION: Gastric catheter within the distal esophagus. This should be advanced several cm. Electronically Signed   By: Inez Catalina M.D.   On: 12/03/2018 20:50   Dg Abd 1 View  Result Date: 11/30/2018 CLINICAL DATA:  OG tube placement. EXAM: ABDOMEN - 1 VIEW COMPARISON:  11/26/2018 FINDINGS: 1329 hours. Lower left chest and upper left abdomen have been included in the field of view. NG tube tip is identified in the mid stomach. Cardiopericardial silhouette appears enlarged with left base retrocardiac collapse/consolidation. IMPRESSION: OG tube tip is in the mid stomach. Electronically Signed   By: Misty Stanley M.D.   On: 11/30/2018 15:21   Dg Abd 1 View  Result Date: 11/26/2018 CLINICAL DATA:  ET and NG tube placement. CXR approved by MD at bedside. MD states no repeat necessary. Best attainable images due to patient conditions. EXAM: ABDOMEN - 1 VIEW COMPARISON:  Abdominal radiograph 11/19/2018 FINDINGS: The nasogastric tube distal aspect is coiled overlying the stomach. Paucity of bowel gas. No supine evidence for free intraperitoneal air. Left retrocardiac opacity and probable small effusion. Probable right basilar atelectasis. IMPRESSION: The nasogastric tube side port projects over the stomach. Electronically Signed   By: Audie Pinto M.D.    On: 11/26/2018 16:48   Dg Abd 1 View  Result Date: 11/17/2018 CLINICAL DATA:  Abdomen distension tube placement EXAM: ABDOMEN - 1 VIEW COMPARISON:  11/10/2018 FINDINGS: Esophageal tube tip visible to the distal stomach but incompletely included. Airspace disease at the right base. IMPRESSION: Esophageal tubing visible to the gastric body but tip is incompletely included in the field of view. Electronically Signed   By: Donavan Foil M.D.   On: 11/17/2018 00:10   Dg Abd 1 View  Result Date: 11/07/2018 CLINICAL DATA:  Abdominal distention EXAM: ABDOMEN - 1 VIEW COMPARISON:  None. FINDINGS: Limited study due to portable nature of the study  and body habitus. I see no significant bowel dilatation or free air. Study otherwise limited. IMPRESSION: Very limited study. No visible changes of bowel obstruction or free air. Electronically Signed   By: Rolm Baptise M.D.   On: 10/20/2018 22:33   Ct Head Wo Contrast  Result Date: 11/27/2018 CLINICAL DATA:  Altered level of consciousness. EXAM: CT HEAD WITHOUT CONTRAST TECHNIQUE: Contiguous axial images were obtained from the base of the skull through the vertex without intravenous contrast. COMPARISON:  None. FINDINGS: Brain: No evidence of acute infarction, hemorrhage, hydrocephalus, extra-axial collection or mass lesion/mass effect. Vascular: No hyperdense vessel or unexpected calcification. Skull: Normal. Negative for fracture or focal lesion. Sinuses/Orbits: Fluid is noted in bilateral mastoid air cells. Bilateral ethmoid and sphenoid sinusitis is noted. Other: None. IMPRESSION: Bilateral ethmoid and sphenoid sinusitis. No acute intracranial abnormality seen. Electronically Signed   By: Marijo Conception M.D.   On: 11/27/2018 15:50   US Renal  Result Date: 11/25/2018 CLINICAL DATA:  Acute renal failure. EXAM: RENAL / URINARY TRACT ULTRASOUND COMPLETE COMPARISON:  Body CT November 17, 2018 FINDINGS: Right Kidney: Renal measurements: 12.2 x 6.9 x 7.5 cm = volume: 334  mL . Echogenicity within normal limits. No mass or hydronephrosis visualized. Left Kidney: Renal measurements: 14.9 x 6.5 x 6.7 cm = volume: 341 mL. Echogenicity within normal limits. No mass or hydronephrosis visualized. Bladder: Decompressed around urinary Foley and therefore not well evaluated. IMPRESSION: Normal appearance of the kidneys, accounting for the technical limitations of the study caused by body habitus. Electronically Signed   By: Fidela Salisbury M.D.   On: 11/25/2018 15:43   Dg Chest Port 1 View  Result Date: 12/07/2018 CLINICAL DATA:  Respiratory failure EXAM: PORTABLE CHEST 1 VIEW COMPARISON:  December 03, 2018 FINDINGS: Limited due to patient position. Endotracheal tube tip is seen 4 cm above the carina. NG tube is seen coursing below the diaphragm. A right-sided central venous catheter seen at the superior cavoatrial junction. Hazy ground-glass and interstitial opacities seen throughout both lungs. There is interval improvement in the right lung aeration. Again noted is cardiomegaly and a probable small bilateral pleural effusions. IMPRESSION: 1. Support lines and tubes in satisfactory position. 2. Interval improvement in the right lung aeration. 3. Diffuse hazy ground-glass and interstitial opacities , which could be due to infection/atelectasis. 4. Small bilateral pleural effusions Electronically Signed   By: Prudencio Pair M.D.   On: 12/07/2018 15:23   Dg Chest Port 1 View  Result Date: 12/03/2018 CLINICAL DATA:  Check endotracheal tube placement EXAM: PORTABLE CHEST 1 VIEW COMPARISON:  Film from earlier in the same day. FINDINGS: Endotracheal tube has been advanced and now lies approximately 2.8 cm above the carina. Gastric catheter is again seen although the tip of the catheter is not appreciated on this exam. Bilateral jugular central lines are again seen and stable. Increasing opacification in the right hemithorax is noted likely related to a combination of consolidation and  effusion. The left lung is clear with the exception of vascular congestion. IMPRESSION: Tubes and lines as described. Persistent vascular congestion. Increasing right hemithorax opacification consistent with a combination of consolidation and effusion. Electronically Signed   By: Inez Catalina M.D.   On: 12/03/2018 20:49   Dg Chest Port 1 View  Result Date: 12/03/2018 CLINICAL DATA:  Endotracheal tube adjustment EXAM: PORTABLE CHEST 1 VIEW COMPARISON:  Chest radiograph from earlier today. FINDINGS: Endotracheal tube tip is 7.0 cm above the carina, at the level of thoracic inlet.  Enteric tube enters stomach with the tip not seen on this image. Left internal jugular central venous catheter terminates in the left brachiocephalic vein near the midline. Right internal jugular central venous catheter terminates in the middle third of the SVC. Stable cardiomediastinal silhouette with moderate cardiomegaly. No pneumothorax. Stable small bilateral pleural effusions, noting portion of the left costophrenic angle not included on this image. Moderate pulmonary edema appears similar. Bibasilar opacity, favor atelectasis, unchanged. IMPRESSION: 1. Endotracheal tube tip 7.0 cm above the carina, located at the level of the thoracic inlet. Additional support structures as detailed. No pneumothorax. 2. Stable moderate congestive heart failure with small bilateral pleural effusions and bibasilar lung opacities, favor atelectasis. Electronically Signed   By: Ilona Sorrel M.D.   On: 12/03/2018 19:48   Dg Chest Port 1 View  Result Date: 12/03/2018 CLINICAL DATA:  Check endotracheal tube placement EXAM: PORTABLE CHEST 1 VIEW COMPARISON:  12/03/2018 FINDINGS: Endotracheal tube is again noted at the level of the thoracic inlet. Gastric catheter is noted extending into the stomach. Bilateral jugular central lines are again seen and stable. Diffuse vascular congestion is noted. Mild interstitial edema is seen. Cardiomegaly is noted.  Likely small posterior effusions are present as well. No bony abnormality is seen. IMPRESSION: Vascular congestion and edema with mild effusions. Electronically Signed   By: Inez Catalina M.D.   On: 12/03/2018 19:19   Dg Chest Port 1 View  Result Date: 12/03/2018 CLINICAL DATA:  Respiratory failure. EXAM: PORTABLE CHEST 1 VIEW COMPARISON:  Radiograph of December 19, 2018. FINDINGS: Stable cardiomegaly. Endotracheal and nasogastric tubes are unchanged in position. Bilateral internal jugular catheters are noted which are unchanged in position. No pneumothorax is noted. Stable bibasilar atelectasis is noted with small pleural effusions. Bony thorax is unremarkable. IMPRESSION: Stable support apparatus. Stable bibasilar opacities as described above. Electronically Signed   By: Marijo Conception M.D.   On: 12/03/2018 07:41   Dg Chest Port 1 View  Result Date: 12/02/2018 CLINICAL DATA:  ET placement EXAM: PORTABLE CHEST 1 VIEW COMPARISON:  Same-day radiograph FINDINGS: Portion of the left lung base is collimated from view. Interval placement of the endotracheal tube within the upper trachea approximately 8 cm from the carina. Recommend advancement 3-4 cm to position in the mid trachea. Transesophageal tube tip and side port distal to the GE junction. Left IJ catheter tip terminates at the brachiocephalic-caval confluence. Right IJ catheter tip terminates in the lower SVC. Some improving aeration is noted in the right hemithorax. Persistent opacity is noted in both lung bases, right greater than left. Cardiomediastinal silhouette is still largely obscured. IMPRESSION: Interval placement of the endotracheal tube within the upper trachea approximately 8 cm from the carina. Recommend advancement 3-4 cm to position in the mid trachea. Remaining lines and tubes in stable position. Improving aeration of the right lung. These results will be called to the ordering clinician or representative by the Radiologist Assistant,  and communication documented in the PACS or zVision Dashboard. Electronically Signed   By: Lovena Le M.D.   On: 12/02/2018 22:16   Dg Chest Port 1 View  Result Date: 12/02/2018 CLINICAL DATA:  Respiratory failure EXAM: PORTABLE CHEST 1 VIEW COMPARISON:  12/02/2018, 12/01/2018, 11/30/2018 FINDINGS: Endotracheal tube tip slightly above thoracic inlet and is 9.8 cm superior to the carina. Left IJ central venous catheter tip projects over the brachiocephalic confluence. Esophageal tube tip courses below diaphragm but is non included. Right IJ central venous catheter tip over the SVC. Interval diffuse  white out of the right thorax. Truncated appearance of the right bronchus. Suspected moderate left pleural effusion with edema or layering fluid on the left. Consolidation left base. Obscured cardiomediastinal silhouette. IMPRESSION: 1. Endotracheal tube tip slightly above thoracic inlet and is 9.8 cm superior to carina 2. Interval diffuse white out of the right thorax. Truncated appearance of the right bronchus suggestive of occlusive process. 3. Continued left pleural effusion with dense airspace disease at the left base. Electronically Signed   By: Donavan Foil M.D.   On: 12/02/2018 21:27   Dg Chest Port 1 View  Result Date: 12/02/2018 CLINICAL DATA:  Acute respiratory failure. EXAM: PORTABLE CHEST 1 VIEW COMPARISON:  12/01/2018 FINDINGS: [name] rotated to the right. Enteric tube courses into the stomach as tip is not definitely visualized. Endotracheal tube has tip approximately 7 cm above the carina. Left IJ central venous catheter unchanged as well as right IJ central venous catheter unchanged. Exam demonstrates worsening bilateral perihilar/bibasilar opacification. Findings likely due to layering bilateral effusions with bibasilar atelectasis as well as interstitial edema. Infection is possible. Cardiomegaly. Remainder of the exam is unchanged. IMPRESSION: Worsening bilateral perihilar bibasilar  opacification likely worsening effusions and CHF. Infection is possible. Tubes and lines as described. Electronically Signed   By: Marin Olp M.D.   On: 12/02/2018 09:13   Dg Chest Port 1 View  Result Date: 12/01/2018 CLINICAL DATA:  Respiratory failure EXAM: PORTABLE CHEST 1 VIEW COMPARISON:  Numerous prior radiographs, most recently 11/30/2010 FINDINGS: Endotracheal tube is positioned within the upper trachea approximately 6 cm from the carina. Distal extent of the transesophageal tube is poorly visualized due to radiographic underpenetration. Left IJ approach central venous catheter tip terminates in the brachiocephalic vein. Right IJ catheter tip terminates near the superior cavoatrial junction. Redemonstration the bilateral pleural effusions which obscure the hemidiaphragms and resulting gradient basilar opacity. Bilateral airspace disease is grossly similar to comparison study accounting for differences in technique. There is redemonstration of the metallic cerclage wire associated with the right ribs. IMPRESSION: 1. Endotracheal tube tip approximately 6 cm from the carina. 2. Distal extent of the transesophageal tube is poorly visualized due to radiographic underpenetration. 3. Left IJ catheter terminates in the brachiocephalic vein. Right IJ catheter at the superior cavoatrial junction. 4. Grossly stable bilateral pleural effusions and bilateral airspace disease. Electronically Signed   By: Lovena Le M.D.   On: 12/01/2018 03:24   Dg Chest Port 1 View  Result Date: 11/30/2018 CLINICAL DATA:  Acute on chronic respiratory failure with severe hypoxia. ARDS with fever. Pneumonia. EXAM: PORTABLE CHEST 1 VIEW COMPARISON:  Single view of the chest 11/29/2018 and 11/28/2018. FINDINGS: Support tubes and lines are unchanged. Marked cardiomegaly is again seen. Left worse than right pleural effusions and airspace disease persist without change. IMPRESSION: No change in left worse than right pleural  effusions and airspace disease. Marked cardiomegaly. No change in support apparatus. Electronically Signed   By: Inge Rise M.D.   On: 11/30/2018 08:37   Dg Chest Port 1 View  Result Date: 11/29/2018 CLINICAL DATA:  Central line placement. EXAM: PORTABLE CHEST 1 VIEW COMPARISON:  11/28/2018 FINDINGS: [name] rotated to the left. Endotracheal tube has tip 6.8 cm above the carina. Enteric tube courses into the stomach as tip is not visualized. Right IJ central venous catheter unchanged with tip over the SVC. Interval placement of left IJ central venous catheter with tip obliquely oriented over the left brachiocephalic vein in the midline. Lungs are adequately inflated  with mild stable hazy density over the left base likely small left effusion with associated atelectasis. Mild stable prominence of the perihilar markings likely mild vascular congestion. Moderate stable cardiomegaly. Remainder of the exam is unchanged. IMPRESSION: Moderate stable cardiomegaly with mild vascular congestion. Stable hazy density over the left base likely small effusion with associated atelectasis. Tubes and lines as described. Interval placement of left IJ central venous catheter with tip in the midline over the brachiocephalic vein. Electronically Signed   By: Marin Olp M.D.   On: 11/29/2018 15:44   Dg Chest Port 1 View  Result Date: 11/28/2018 CLINICAL DATA:  Fever, intubation EXAM: PORTABLE CHEST 1 VIEW COMPARISON:  11/26/2018 FINDINGS: No significant interval change in AP portable chest radiograph with endotracheal tube over the mid trachea, esophagogastric tube, right neck vascular catheter, cardiomegaly, and layering small bilateral pleural effusions. No new or focal airspace opacity. IMPRESSION: No significant interval change in AP portable chest radiograph with cardiomegaly and layering small pleural effusions. No new or focal airspace opacity. Unchanged support apparatus. Electronically Signed   By: Eddie Candle  M.D.   On: 11/28/2018 12:47   Dg Chest Port 1 View  Result Date: 11/26/2018 CLINICAL DATA:  ET and NG tube placement. CXR approved by MD at bedside. MD states no repeat necessary. Best attainable images due to patient conditions. EXAM: PORTABLE CHEST 1 VIEW COMPARISON:  Chest radiograph 11/24/2018, 11/22/2018 FINDINGS: Endotracheal tube tip terminates between the thoracic inlet and carina. The nasogastric tube courses below the diaphragm with nonvisualization of the distal tip. The right central venous catheter tip projects over the SVC. Cardiomegaly. No definite pneumothorax. Poor visualization of the bilateral lung bases but suspect persistent bibasilar opacities. No acute finding in the visualized skeleton. IMPRESSION: 1. Endotracheal tube tip terminates between the thoracic inlet and carina. 2. Nasogastric tube positioning is better seen on the subsequent abdominal radiograph. 3. Poor visualization of the lung bases but suspect ongoing bibasilar pulmonary opacities. Electronically Signed   By: Audie Pinto M.D.   On: 11/26/2018 16:44   Dg Chest Port 1 View  Result Date: 11/24/2018 CLINICAL DATA:  Shortness of breath EXAM: PORTABLE CHEST 1 VIEW COMPARISON:  November 22, 2018 FINDINGS: Endotracheal tube tip is seen 3.5 cm above the level of carina. NG tube is seen coursing below the diaphragm. A right-sided central venous catheter is seen within the mid SVC./slight interval improved aeration since prior exam. Probable subsegmental atelectasis remains at the lung bases. There is a retrocardiac opacity. IMPRESSION: Slight interval improvement in aeration. However there remains a retrocardiac opacity which could be atelectasis and/or infectious etiology. Electronically Signed   By: Prudencio Pair M.D.   On: 11/24/2018 03:51   Dg Chest Port 1 View  Result Date: 11/22/2018 CLINICAL DATA:  Ventilator support.  Follow-up. EXAM: PORTABLE CHEST 1 VIEW COMPARISON:  Earlier same day FINDINGS: Endotracheal tube  tip is 3 cm above the carina. Orogastric or nasogastric tube enters the abdomen. Right internal jugular central line tip in the SVC above the right atrium. Persistent bilateral lower lung atelectasis and or pneumonia. No worsening or new finding. IMPRESSION: No apparent change since earlier today. Lines and tubes well position. Persistent atelectasis and or pneumonia in the mid and lower lungs. Electronically Signed   By: Nelson Chimes M.D.   On: 11/22/2018 21:23   Dg Chest Port 1 View  Result Date: 11/22/2018 CLINICAL DATA:  Central line placement EXAM: PORTABLE CHEST 1 VIEW COMPARISON:  11/22/2018 FINDINGS: [name] rotated. Interval  placement of a right IJ central venous catheter with distal tip terminating at the expected level of the superior cavoatrial junction. ET tube remains in place with distal tip terminating 3.8 cm superior to the carina. Enteric tube courses below the diaphragm with distal tip beyond the inferior margin of the film. Multiple overlying external leads. Cardiac silhouette is largely obscured but appears enlarged. Bilateral pleural effusions. Volume loss within the right lung. Slight improved aeration of the right lung compared to prior. No pneumothorax visualized. IMPRESSION: 1. Interval placement of right IJ central venous catheter. No pneumothorax. 2. Improving aeration of the right lung. Electronically Signed   By: Davina Poke M.D.   On: 11/22/2018 14:26   Dg Chest Port 1 View  Result Date: 11/22/2018 CLINICAL DATA:  Multiple tracheobronchial mucus plugs, history CHF, diabetes mellitus, atrial fibrillation, hypertension EXAM: PORTABLE CHEST 1 VIEW COMPARISON:  Portable exam 0830 hours compared to 0035 hours FINDINGS: Tip of endotracheal tube projects 4.1 cm above carina. Nasogastric tube extends into abdomen. Volume loss in the RIGHT hemithorax with slight mediastinal shift to the RIGHT, likely reflecting a combination of atelectasis and question effusion. LEFT pleural  effusion and basilar atelectasis are present. No pneumothorax. Bones demineralized. IMPRESSION: Atelectasis and probable pleural effusion opacified RIGHT hemithorax with volume loss. Persistent LEFT pleural effusion and basilar atelectasis. Electronically Signed   By: Lavonia Dana M.D.   On: 11/22/2018 08:44   Dg Chest Port 1 View  Result Date: 11/22/2018 CLINICAL DATA:  Hypoxia. EXAM: PORTABLE CHEST 1 VIEW COMPARISON:  Radiograph yesterday at 0550 hour FINDINGS: Endotracheal tube tip 3.6 cm from the carina. Enteric tube in place tip not well visualized. Complete opacification of the right hemithorax which is new from prior exam. Heart size and mediastinal contours are obscured. Left pleural effusion with volume loss in the left lung. Congestive changes on the left. IMPRESSION: 1. Complete opacification of the right hemithorax which is new from prior exam. Question underlying mucous plugging. 2. Overall low lung volumes. 3. Support apparatus unchanged. Electronically Signed   By: Keith Rake M.D.   On: 11/22/2018 01:11   Dg Chest Port 1 View  Result Date: 11/21/2018 CLINICAL DATA:  Acute respiratory failure. EXAM: PORTABLE CHEST 1 VIEW COMPARISON:  Radiograph of November 19, 2018. FINDINGS: Stable cardiomegaly. Endotracheal and nasogastric tubes are unchanged in position. No pneumothorax is noted. Probable mild to moderate size left pleural effusion is noted with associated atelectasis or infiltrate. Minimal right basilar atelectasis may be present. Bony thorax unremarkable. IMPRESSION: Stable support apparatus. Increased left basilar density is noted concerning for mild to moderate pleural effusion with probable underlying atelectasis or infiltrate. Electronically Signed   By: Marijo Conception M.D.   On: 11/21/2018 08:39   Dg Chest Port 1 View  Result Date: 11/19/2018 CLINICAL DATA:  Respiratory failure. EXAM: PORTABLE CHEST 1 VIEW COMPARISON:  11/06/2018 prior radiographs FINDINGS: An endotracheal tube  with tip 4 cm above the carina and NG tube entering the stomach with tip off the field of view again noted. Cardiomegaly, pulmonary vascular congestion, small bilateral pleural effusions and bilateral LOWER lung atelectasis/consolidation again noted. There is no evidence of pneumothorax. IMPRESSION: Unchanged appearance of the chest with support apparatus as described. Cardiomegaly, pulmonary vascular congestion, small bilateral pleural effusions and bilateral LOWER lung atelectasis/consolidation. Electronically Signed   By: Margarette Canada M.D.   On: 11/19/2018 16:51   Dg Chest Port 1 View  Result Date: 11/17/2018 CLINICAL DATA:  Intubation EXAM: PORTABLE CHEST 1 VIEW  COMPARISON:  11/17/2018, 10/21/2018 FINDINGS: Endotracheal tube tip is about 3.1 cm superior to the carina. Esophageal tube tip extends below the diaphragm but is non included. Bilateral pleural effusions, likely layering on the right. Cardiomegaly with vascular congestion and bilateral pulmonary edema. Basilar consolidations. No pneumothorax. IMPRESSION: 1. Endotracheal tube tip about 3.1 cm superior to carina 2. Cardiomegaly with vascular congestion, pulmonary edema and bilateral pleural effusion Electronically Signed   By: Donavan Foil M.D.   On: 11/17/2018 00:12   Dg Chest Port 1 View  Result Date: 11/01/2018 CLINICAL DATA:  Shortness of breath for 2 weeks. EXAM: PORTABLE CHEST 1 VIEW COMPARISON:  11/04/2010 radiograph FINDINGS: Cardiomegaly with pulmonary vascular congestion noted. Possible mild interstitial opacities/edema noted. No pneumothorax or acute bony abnormality. Bibasilar atelectasis noted. There may be trace pleural effusions present. IMPRESSION: Cardiomegaly with pulmonary vascular congestion and possible mild interstitial edema. Bibasilar atelectasis.  Possible trace pleural effusions. Electronically Signed   By: Margarette Canada M.D.   On: 11/14/2018 17:13   Dg Abd Portable 1v  Result Date: 11/19/2018 CLINICAL DATA:  NG tube  placement. EXAM: PORTABLE ABDOMEN - 1 VIEW COMPARISON:  None. FINDINGS: An NG tube is noted with tip overlying the distal stomach. IMPRESSION: NG tube with tip overlying the distal stomach. Electronically Signed   By: Margarette Canada M.D.   On: 11/19/2018 16:51    Consults: Treatment Team:  Corey Skains, MD   Subjective:    Overnight Issues: Continues to be poorly responsive, unable to wean.  Attempted PSV, tolerated poorly due to increased thoracoabdominal asynchrony and tachycardia.  Massive anasarca.  No mottling today off of pressors.  Has been afebrile overnight.  Urine output increasing with albumin.  Objective:  Vital signs for last 24 hours: Temp:  [97.6 F (36.4 C)-98.9 F (37.2 C)] 97.9 F (36.6 C) (09/20 1100) Pulse Rate:  [82-155] 126 (09/20 1500) Resp:  [22-29] 29 (09/20 1500) BP: (103-161)/(67-105) 143/105 (09/20 1500) SpO2:  [91 %-99 %] 96 % (09/20 1500) FiO2 (%):  [30 %] 30 % (09/20 1336)  Hemodynamic parameters for last 24 hours:    Intake/Output from previous day: 09/19 0701 - 09/20 0700 In: 3159.8 [I.V.:949.9; NG/GT:1880; IV Piggyback:299.9] Out: 2285 [Urine:1785; Stool:500]  Intake/Output this shift: Total I/O In: 2842.6 [I.V.:322.6; Other:30; NG/GT:2330; IV Piggyback:160] Out: 1315 [Urine:1315]  Vent settings for last 24 hours: Vent Mode: PCV FiO2 (%):  [30 %] 30 % Set Rate:  [20 bmp] 20 bmp Vt Set:  [500 mL] 500 mL PEEP:  [6 cmH20] 6 cmH20 Plateau Pressure:  [18 [id number] cmH20] 20 cmH20  Physical Exam:  GENERAL: Massively obese gentleman, poorly responsive on no sedation.  Thoracoabdominal asynchrony on PSV tracheostomy in place. HEAD: Normocephalic, atraumatic.  EYES: Pupils equal, round, reactive to light.  No scleral icterus.  There is scleral edema. MOUTH: Oral mucosa without thrush. NECK: Supple. Tracheostomy in place, clean PULMONARY: +rhonchi, no wheezing, scattered rales CARDIOVASCULAR: S1 and S2.  Irregular rhythm.  Tachycardic.  No  murmurs, rubs, or gallops.  GASTROINTESTINAL: Soft, non--distended.  Protuberant/obese.  Positive bowel sounds.  MUSCULOSKELETAL: 3-4+ anasarca.  No mottling on toes and fingertips.  Onychomycosis NEUROLOGIC: Poorly responsive, more lethargic today. SKIN:intact,warm,dry  Assessment/Plan:  1. Acute on chronic hypoxic/hypercapnic respiratory failure with prolonged ventilator dependence:Bilateral pleural effusions with pulmonary edema due to worsening renal failure, underlying extreme obesity with obesity hypoventilation and severe OSA.  Compressive atelectasis has improved however continues to require full ventilatory support.  He is extremely volume overloaded but did  not tolerate dialysis.  May need restart of CRRT.  Ventilator changes made as appropriate. Titrate O2 and PEEP for saturations of 88 to 92%.Bronchodilator therapy/pulmonary toilet.   Had element of ARDS initially this appears now to be resolved.  Chronic respiratory failure on the basis of Pickwickian syndrome due to extreme obesity with obesity hypoventilation syndrome.  He is tolerating PSV weaning poorly.  2. Resolving shock physiology, off pressors: Complicated by acute diastolic heart failure decompensation:Has not been able to tolerate dialysis.  Has significant volume overload  3. Acute kidney failuresuperimposed on chronic kidney disease stage III: Appreciate nephrology's input. Discussed with Dr. Candiss Norse, patient is making some urine however, filtering capacity is decreased.  He is exceedingly volume overloaded and ideally should be dialyzed however, he is not tolerating this.  Dr. Candiss Norse proposes potential CRRT if urine output does not improve however Dr. Candiss Norse does note that this patient's overall prognosis is exceedingly poor and CRRT in this situation will be of limited use.  The patient's sister was at bedside today and Dr. Candiss Norse and I both spoke with her indicating his prognosis is exceedingly poor.  We have again  readdressed goals of therapy.  Sister is still undecided.  Will need to get Palliative Care back on board.  The patient has made no progress in 23 days. . 4. Altered mental status/obtundation/ventilator associated discomfort:Continue to decrease sedatives.  Decreasing sedatives receiving via OG. Toxic metabolic encephalopathy due to multiple comorbid situations coexisting, continue to monitor.  Previously CT scan of the head did not show any abnormality.  Patient exceeds table capacity for MRI.  5. Fever: He was treated for pneumonia initially.  He had recurrent fever 9/18, meropenem restarted.  Final cultures pending.  6.  Atrial fibrillation with rapid ventricular response: He is on amiodarone, required rebolus.  Monitor.  Back to A. fib with RVR today.  Exceedingly poor prognosis.  7.  Type 2 diabetes with persistent hyperglycemia: ICU hyperglycemia protocol.  8.  Thrombocytopenia, persistent: HIT antibodies negative.  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  Discontinued aspirin due to worsening thrombocytopenia.  No need for transfusion yet but will continue to monitor for potential signs of bleeding.  9.  Severe protein malnutrition (albumin 1.5) in the setting of massive obesity (sarcopenic obesity): He is receiving nutrition, on albumin with some decrease in third spacing.   Patient remains DNR.[name] prognosis is exceedingly poor.  This was made very clear to the patient's sister today.  She remains noncommittal as to goals of care.  Continue to advocate for comfort measures.   LOS: 23 days   Additional comments: None  Critical Care Total Time*: 45 Minutes  C. Derrill Kay, MD [facility] 12/09/2018  *Care during the described time interval was provided by me and/or other providers on the critical care team.  I have reviewed this patient's available data, including medical history, events of note, physical examination and test results as part of my  evaluation.

## 2018-12-10 LAB — BASIC METABOLIC PANEL
Anion gap: 8 (ref 5–15)
BUN: 115 mg/dL — ABNORMAL HIGH (ref 6–20)
CO2: 26 mmol/L (ref 22–32)
Calcium: 8.3 mg/dL — ABNORMAL LOW (ref 8.9–10.3)
Chloride: 117 mmol/L — ABNORMAL HIGH (ref 98–111)
Creatinine, Ser: 2.28 mg/dL — ABNORMAL HIGH (ref 0.61–1.24)
GFR calc Af Amer: 35 mL/min — ABNORMAL LOW (ref >60)
GFR calc non Af Amer: 30 mL/min — ABNORMAL LOW (ref >60)
Glucose, Bld: 155 mg/dL — ABNORMAL HIGH (ref 70–99)
Potassium: 3.2 mmol/L — ABNORMAL LOW (ref 3.5–5.1)
Sodium: 151 mmol/L — ABNORMAL HIGH (ref 135–145)

## 2018-12-10 LAB — GLUCOSE, CAPILLARY
Glucose-Capillary: 132 mg/dL — ABNORMAL HIGH (ref 70–99)
Glucose-Capillary: 142 mg/dL — ABNORMAL HIGH (ref 70–99)
Glucose-Capillary: 144 mg/dL — ABNORMAL HIGH (ref 70–99)
Glucose-Capillary: 154 mg/dL — ABNORMAL HIGH (ref 70–99)
Glucose-Capillary: 174 mg/dL — ABNORMAL HIGH (ref 70–99)
Glucose-Capillary: 184 mg/dL — ABNORMAL HIGH (ref 70–99)

## 2018-12-10 LAB — MAGNESIUM: Magnesium: 2.4 mg/dL (ref 1.7–2.4)

## 2018-12-10 MED ORDER — QUETIAPINE FUMARATE 25 MG PO TABS
75.0000 mg | ORAL_TABLET | Freq: Every day | ORAL | Status: DC
  Administered 2018-12-10 – 2018-12-11 (×2): 75 mg
  Filled 2018-12-10 (×2): qty 3

## 2018-12-10 MED ORDER — POTASSIUM CHLORIDE 20 MEQ PO PACK
40.0000 meq | PACK | Freq: Once | ORAL | Status: AC
  Administered 2018-12-10: 40 meq
  Filled 2018-12-10: qty 2

## 2018-12-10 MED ORDER — POTASSIUM CL IN DEXTROSE 5% 20 MEQ/L IV SOLN
20.0000 meq | INTRAVENOUS | Status: DC
  Administered 2018-12-10 – 2018-12-12 (×4): 20 meq via INTRAVENOUS
  Filled 2018-12-10 (×5): qty 1000

## 2018-12-10 NOTE — Consult Note (Signed)
Pharmacy Antibiotic Note  Kyle White is a 58 y.o. male admitted on 11/02/2018 with pneumonia. 9/3 BAL significant for Staph simulans and Strep mitis/oralis. Patient had received approximately 4 days of vancomycin therapy this admission (last dose 9/9). Meropenem was started on 9/9 for persistent fevers, concern for pseudomonas, and head CT significant for sinusitis. Meropenem and vancomycin were d/c 9/10 and ceftriaxone monotherapy was started. Ceftriaxone was then changed to Zosyn on 9/13 after concern for possible aspiration pneumonia. Per discussion on rounds plan is to discontinue Zosyn and re-start vancomycin as duration of therapy was determined to be inadequate. Pharmacy consulted for vancomycin dosing for Staph simulans and Strep mitis/oralis pneumonia. 9/14 CXR significant for stable bibasilar opacities with small pleural effusions. Patient also with acute renal failure secondary to acute cardiorenal syndrome receiving intermittent HD for uremia and fluid overload.    Patient remains intubated and POD #5 from tracheostomy . Patient with pancytopenia and also fluid overloaded. Patient with fevers on 9/18 and subsequently started on meropenem. Patient completed vancomycin therapy on 9/18. Dialysis access was removed on 9/21. Patient with good urine output.   Plan:   Continue meropenem 1g IV Q8hr.   Height: 5' 9.02" (175.3 cm) Weight: (!) 496 lb 0.6 oz (225 kg) IBW/kg (Calculated) : 70.74  Temp (24hrs), Avg:98.5 F (36.9 C), Min:97.8 F (36.6 C), Max:99.4 F (37.4 C)  Recent Labs  Lab 12/04/18 1400 12/19/2018 0413 12/06/18 0503 12/07/18 0505 12/08/18 0615 12/09/18 0446 12/10/18 0503  WBC  --  4.3 3.9* 4.1 3.2* 3.3*  --   CREATININE  --  3.29* 2.92* 2.55* 2.80* 2.49* 2.28*  VANCORANDOM 19  --   --  14  --   --   --     Estimated Creatinine Clearance: 65.3 mL/min (A) (by C-G formula based on SCr of 2.28 mg/dL (H)).    No Active Allergies  Antimicrobials this  admission: Cefepime 9/3 >> 9/8 Vancomycin 9/7 >> 9/10, 9/14 >>9/18 Meropenem 9/9 >> 9/10; 9/18>>  Ceftriaxone 9/11 >> 9/12 Zosyn 9/13 >> 9/14  Dose adjustments this admission:  9/9: Vancomycin decreased from 1750 mg Q24H to 1250 mg Q24H due to serum creatinine bump  9/10: Meropenem decreased from 1g Q12H to 500 mg Q24H due to patient starting intermittent HD. Vancomycin transitioned to Dialysis dosing.  9/19 Meropenem transitioned from Q12hr to Q8hr dosing.   Microbiology results: 9/18 Blood cultures: no growth x 3 days  9/18 Respiratory Culture: few candida albicans  9/18 Urine culture: no growth  9/13 tracheal aspirate: few candida albicans 9/9 respiratory culture: rare GPC in pairs 9/9 respiratory panel: none detected 9/7 Bcx: no growth  9/3 BAL: Staph simulans & Strep mitis/oralis, vancomycin susceptible 8/29 Bcx: No growth 8/28 Ucx: No growth 8/28 MRSA PCR: negative  Thank you for allowing pharmacy to be a part of this patient's care.  Leanza Shepperson L  12/10/2018 1:59 PM

## 2018-12-10 NOTE — Progress Notes (Signed)
Pharmacy Electrolyte Monitoring Consult:  Pharmacy consulted to assist in monitoring and replacing electrolytes in this 58 y.o. male admitted on 11/10/2018. [name] currently intubated and sedated on fentanyl infusion.   Labs:  Sodium (mmol/L)  Date Value  12/10/2018 151 (H)   Potassium (mmol/L)  Date Value  12/10/2018 3.2 (L)   Magnesium (mg/dL)  Date Value  12/10/2018 2.4   Phosphorus (mg/dL)  Date Value  12/09/2018 4.8 (H)   Calcium (mg/dL)  Date Value  12/10/2018 8.3 (L)   Albumin (g/dL)  Date Value  12/09/2018 1.7 (L)   Corrected Calcium: 9.7  Assessment/Plan: 1. Electrolytes: Sodium trending slightly up today. Hypokalemia on labs this morning. Unable to tolerate last session of dialysis. [name] fluid overloaded. Albumin added yesterday to help with oncotic pressure. Creatinine improving today. Continue to follow dialysis plan. Currently having regular bowel movements.    --Free water flushes at 200 mL q4h    --Oral potassium chloride 40 mEq x1 today    --D5/48mEq of Potassium at 20mL/hr started by nephrology.   2. Constipation: Patient with bowel movement last five days. Per nephrology avoid Fleet enemas. Constipating medications include fentanyl infusion. [name] also diabetic. Bowel regimen discontinued due to regular bowel movements. Continue to monitor for further constipation or diarrhea.   3. Glucose: History of persistent hyperglycemia this admission. [name] now off of insulin drip and transitioned to [employer] insulin. Diet includes continuous tube feeds as tolerated. Corticosteroids discontinued 9/11. Glucoses within goal range.    --Continue insulin glargine 40 units daily    --Continue 4 units insulin aspart q4h while on tube feeds    --Continue SSI q4h.   Pharmacy will continue to monitor and adjust per consult.   Kyle White L  12/10/2018 4:09 PM

## 2018-12-10 NOTE — Progress Notes (Signed)
[facility], [place] 12/10/18  Subjective:   LOS: 24 09/20 0701 - 09/21 0700 In: 4539.4 [I.V.:1054.2; NG/GT:3050; IV Piggyback:405.2] Out: 3365 [Urine:3115; Stool:250]  The patient remains on the ventilator at present.  Renal parameters have improved however.  Urine output was up to 3.1 L over the preceding 24 hours.  Objective:  Vital signs in last 24 hours:  Temp:  [97.8 F (36.6 C)-98.8 F (37.1 C)] 98.8 F (37.1 C) (09/21 0800) Pulse Rate:  [42-144] 144 (09/21 0900) Resp:  [20-31] 23 (09/21 0900) BP: (104-148)/(62-105) 107/62 (09/21 0900) SpO2:  [91 %-98 %] 93 % (09/21 0900) FiO2 (%):  [30 %] 30 % (09/21 0800) Weight:  [225 kg] 225 kg (09/21 0452)  Weight change:  Filed Weights   12/07/18 0500 12/08/18 0500 12/10/18 0452  Weight: (!) 227 kg (!) 224 kg (!) 225 kg    Intake/Output:    Intake/Output Summary (Last 24 hours) at 12/10/2018 1052 Last data filed at 12/10/2018 1000 Gross per 24 hour  Intake 3289.74 ml  Output 3075 ml  Net 214.74 ml     Physical Exam: General: critically ill apearing  HEENT NGT in place, conjunctival edema  Pulm/lungs Vent assisted, Tracheostomy;  placed 11/27/2018  CVS/Heart Irregular  Abdomen:  Obese, BS present  Extremities: 2+ LE edema  Neurologic: Awake but not following commands  Skin: warm  Access: Rt femoral dialysis cathter in place   Rectal tube, Foley in place    Basic Metabolic Panel:  Recent Labs  Lab 12/04/18 0438  12/06/18 0503 12/07/18 0505 12/08/18 0615 12/09/18 0446 12/10/18 0503  NA 149*   < > 150* 148* 148* 149* 151*  K 3.1*   < > 3.6 3.5 3.5 3.0* 3.2*  CL 111   < > 115* 114* 114* 114* 117*  CO2 22   < > 23 23 23 24 26   GLUCOSE 181*   < > 174* 182* 191* 181* 155*  BUN 144*   < > 123* 118* 136* 121* 115*  CREATININE 3.38*   < > 2.92* 2.55* 2.80* 2.49* 2.28*  CALCIUM 8.0*   < > 8.1* 7.9* 7.8* 8.2* 8.3*  MG 2.9*  --   --   --  2.5*  --  2.4  PHOS 6.2*  --   --   --  5.9* 4.8*   --    < > = values in this interval not displayed.     CBC: Recent Labs  Lab 12/17/2018 0413 11/29/2018 1630 12/06/18 0503 12/07/18 0505 12/08/18 0615 12/09/18 0446  WBC 4.3  --  3.9* 4.1 3.2* 3.3*  NEUTROABS  --   --   --  3.3 2.6  --   HGB 9.5*  --  9.3* 9.0* 8.2* 8.8*  HCT 30.6*  --  29.9* 28.5* 26.2* 28.4*  MCV 95.3  --  95.8 95.0 96.0 95.0  PLT 47* 57* 54* 51* 45* 51*      Lab Results  Component Value Date   HEPBSAG Negative 11/29/2018   HEPBSAB Non Reactive 11/29/2018   HEPBIGM Negative 11/29/2018      Microbiology:  Recent Results (from the past 240 hour(s))  Culture, respiratory (non-expectorated)     Status: None   Collection Time: 12/02/18  8:45 AM   Specimen: Tracheal Aspirate; Respiratory  Result Value Ref Range Status   Specimen Description   Final    TRACHEAL ASPIRATE Performed at [facility], [address], [place], [place] [address]  Special Requests   Final    NONE Performed at [facility], [address] [place], [place] [address]    Gram Stain   Final    NO WBC SEEN NO ORGANISMS SEEN Performed at [facility], [place] [address] [place], [place] [address]    Culture FEW CANDIDA ALBICANS  Final   Report Status 12/04/2018 FINAL  Final  Urine Culture     Status: None   Collection Time: 12/07/18  3:16 PM   Specimen: Urine, Random  Result Value Ref Range Status   Specimen Description   Final    URINE, RANDOM Performed at [facility], [address] [place], [place] [address]    Special Requests   Final    NONE Performed at [facility], [address] [place], [place] [address]    Culture   Final    NO GROWTH Performed at [facility], [place] [address], [place], [place] [address]    Report Status 12/08/2018 FINAL  Final  Culture, respiratory (non-expectorated)     Status: None   Collection Time: 12/07/18  3:24 PM   Specimen: Tracheal Aspirate; Respiratory  Result Value Ref  Range Status   Specimen Description   Final    TRACHEAL ASPIRATE Performed at [facility], [address] [place], [place] [address]    Special Requests   Final    NONE Performed at [facility], [address] [place], [place] [address]    Gram Stain   Final    FEW WBC PRESENT, PREDOMINANTLY PMN RARE YEAST RARE GRAM POSITIVE COCCI IN CLUSTERS Performed at [facility], [place] [address] [place], [place] [address]    Culture FEW CANDIDA ALBICANS  Final   Report Status 12/09/2018 FINAL  Final  CULTURE, BLOOD (ROUTINE X 2) w Reflex to ID Panel     Status: None (Preliminary result)   Collection Time: 12/07/18  3:27 PM   Specimen: BLOOD  Result Value Ref Range Status   Specimen Description BLOOD PORT  Final   Special Requests   Final    BOTTLES DRAWN AEROBIC AND ANAEROBIC Blood Culture adequate volume   Culture   Final    NO GROWTH 3 DAYS Performed at [facility], [address] [place], [place] [address]    Report Status PENDING  Incomplete  CULTURE, BLOOD (ROUTINE X 2) w Reflex to ID Panel     Status: None (Preliminary result)   Collection Time: 12/07/18  3:30 PM   Specimen: BLOOD  Result Value Ref Range Status   Specimen Description BLOOD RT HAND  Final   Special Requests   Final    BOTTLES DRAWN AEROBIC AND ANAEROBIC Blood Culture adequate volume   Culture   Final    NO GROWTH 3 DAYS Performed at [facility], [address] [place],  [address]    Report Status PENDING  Incomplete    Coagulation Studies: No results for input(s): LABPROT, INR in the last 72 hours.  Urinalysis: Recent Labs    12/07/18 1516  COLORURINE YELLOW*  LABSPEC 1.015  PHURINE 5.0  GLUCOSEU NEGATIVE  HGBUR LARGE*  BILIRUBINUR NEGATIVE  KETONESUR NEGATIVE  PROTEINUR 30*  NITRITE NEGATIVE  LEUKOCYTESUR NEGATIVE      Imaging: No results found.   Medications:   . sodium chloride Stopped (12/09/18 0343)  . albumin human  Stopped (12/10/18 0913)  . amiodarone 60 mg/hr (12/10/18 1000)  . anticoagulant sodium  citrate    . feeding supplement (VITAL HIGH PROTEIN) 1,000 mL (12/09/18 1602)  . fentaNYL infusion INTRAVENOUS 200 mcg/hr (12/10/18 1000)  . meropenem (MERREM) IV 200 mL/hr at 12/10/18 1000   . chlorhexidine gluconate (MEDLINE KIT)  15 mL Mouth Rinse BID  . Chlorhexidine Gluconate Cloth  6 each Topical Daily  . clonazePAM  0.5 mg Oral BID  . feeding supplement (PRO-STAT SUGAR FREE 64)  60 mL Per Tube TID  . free water  200 mL Per Tube Q4H  . insulin aspart  0-20 Units Subcutaneous Q4H  . insulin aspart  4 Units Subcutaneous Q4H  . insulin glargine  40 Units Subcutaneous Daily  . ipratropium-albuterol  3 mL Nebulization Q6H  . mouth rinse  15 mL Mouth Rinse 10 times per day  . multivitamin  15 mL Per Tube Daily  . pantoprazole sodium  40 mg Per Tube QHS  . QUEtiapine  75 mg Per Tube QHS  . sodium chloride flush  10-40 mL Intracatheter Q12H   sodium chloride, acetaminophen, anticoagulant sodium citrate, bisacodyl, fentaNYL, metoprolol tartrate, midazolam, sodium chloride flush  Assessment/ Plan:  58 y.o. male with with atrial fibrillation on warfarin, congestive heart failure, diabetes mellitus type 2, obstructive sleep apnea,morbid obesity, gout, mitral valve replacementwho was [facility]2020for acute exacerbation of COPD  Active Problems:   Acute exacerbation of CHF (congestive heart failure) ([place])   Acute respiratory failure with hypoxia and hypercapnia ([facility])   Goals of care, counseling/discussion   Palliative care by specialist   DNR (do not resuscitate) discussion   COPD exacerbation ([place])   #. ARF on CKD st3 Recent Labs    12/07/18 0505 12/08/18 0615 12/09/18 0446 12/10/18 0503  CREATININE 2.55* 2.80* 2.49* 2.28*  Acute renal failure secondary to ATN from multiorgan failure -Renal parameters appear to be improving.  Creatinine currently 2.3 with urine output of 3.1  L over the preceding 24 hours.  Therefore no further indication for dialysis.  Remove temporary dialysis catheter.  # Hypokalemia,  Potassium up to 3.2.  Continue repletion efforts.  # hypernatremia -Sodium high at 151.  Start the patient on D5W at 75 cc/h.  #. Anemia    Lab Results  Component Value Date   HGB 8.8 (L) 12/09/2018   # Acute resp failure -Continue current ventilatory support.  # generalized Edema - 2 D echo from 8/29 shows LVEF 55-60% (nomral).  -Patient should begin to mobilize fluid now that his renal function has improved.  #. Diabetes type 2 with CKD Hgb A1c MFr Bld (%)  Date Value  11/17/2018 7.3 (H)   -Glycemic control as per hospitalist.    LOS: 24 Kyle White [place] 9/21/202010:52 [place], [place]

## 2018-12-10 NOTE — Progress Notes (Signed)
[facility] NAME: Kyle White    MR#:  [id number]  DATE OF BIRTH:  12-04-1960  SUBJECTIVE:  Pt is remains intubated on the ventilator, severely hypoxemic with underlying pickwickian syndrome,unsuccessful weaning trials.now has trach  Critically ill, uremic, started hemodialysis--issues with catheter clotting --now on hold making some urine underwent bronchoscopy 11/22/18.  Palliative care team is involved   patient now on IV  amiodarone, fentanyl Had fever--now on Meropenem  S/p trach 12/02/2018 S/p right femoral catheter REVIEW OF SYSTEMS:   Review of Systems  Unable to perform ROS: Intubated  Psychiatric/Behavioral: Nervous/anxious:      DRUG ALLERGIES:   No Active Allergies  VITALS:  Blood pressure (!) 132/92, pulse (!) 133, temperature 99.4 F (37.4 C), temperature source Oral, resp. rate (!) 24, height 5' 9.02" (1.753 m), weight (!) 225 kg, SpO2 94 %.  PHYSICAL EXAMINATION:   Physical Exam  GENERAL:  58 y.o.-year-old patient lying in the bed with no acute distress. Morbidly obese  Critically ill ,on the vent EYES: Pupils equal, round, reactive to light and accommodation. No scleral icterus. Extraocular muscles intact.  HEENT: Head atraumatic, normocephalic. Oropharynx and nasopharynx clear. TRACH+ NECK:  Supple, no jugular venous distention. No thyroid enlargement, no tenderness.  LUNGS: Mod breath sounds bilaterally, positive wheezing, rales, rhonchi. CARDIOVASCULAR: S1, S2 normal. No murmurs, rubs, or gallops.  ABDOMEN: Soft, nontender, nondistended. Bowel sounds present. No organomegaly or mass.  EXTREMITIES: edema+NEUROLOGIC: awake reponds to painful stimuli  right groin HD cath SKIN: per RN documetnation  LABORATORY PANEL:  CBC Recent Labs  Lab 12/09/18 0446  WBC 3.3*  HGB 8.8*  HCT 28.4*  PLT 51*    Chemistries  Recent Labs  Lab 12/07/18 1505  12/10/18 0503  NA  --    < > 151*  K  --     < > 3.2*  CL  --    < > 117*  CO2  --    < > 26  GLUCOSE  --    < > 155*  BUN  --    < > 115*  CREATININE  --    < > 2.28*  CALCIUM  --    < > 8.3*  MG  --    < > 2.4  AST 61*  --   --   ALT 84*  --   --   ALKPHOS 382*  --   --   BILITOT 0.9  --   --    < > = values in this interval not displayed.   Cardiac Enzymes No results for input(s): TROPONINI in the last 168 hours. RADIOLOGY:  No results found. ASSESSMENT AND PLAN:  Kyle White  is a 58 y.o. male with a known history of atrial fibrillation on Coumadin, CHF, diabetes mellitus, and obstructive sleep apnea with CPAP use at home, morbid obesity with sedentary lifestyle on oxygen at 2 L/min at home.  1.Acute on chronic  respiratory failure with severe hypoxia- underlying pickwickian syndrome -No clinical improvement, unsuccessful weaning trials--s/p tracheostomy 12/08/2018. Pt has shown no progress for > 3weeks -  -patient underwent bronchoscopy 11/22/2018. 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 -vent support --- extremely poor prognosis with high mortality and morbidity  #-Sepsis from pneumonia with ARDS Goal is to keep map greater than or equal to 65 -OFF IV phenylephrine gtt -compledted Abxs -had fever--now on meropenem since 12/08/2018  #.Obstructive sleep apnea-- chronic  #Acute on  chronic diastolic CHF, EF 55 to 123456 on echo in 2014 at [facility] -Repeat echocardiogram shows EF of 55 to 60%  # History of atrial fibrillation -IV amiodarone gtt -Coumadin on hold  -Heparin drip  discontinued in view of thrombocytopenia . HIT negative  #. Acute renal failure on chronic kidney disease stage III--NOW on HD  with baseline creatinine 1.28-1.89-1.87-2.25-2.21-3.05--3.17-3.11--2.5 -good UOP -continue to monitor renal function closely -Patient being uremic nephrology started the patient on hemodialysis--now on hold due to hemodynamic instability -Has issues with Perm cath--has right groin temp cath +  #  Type  IIDiabetes mellitus -Sliding scale insulin + lantus -Hemoglobin A1c 7.3    Overall poor prognosis. Appreciate palliative care input.   CODE STATUS: DNR  DVT Prophylaxis: scd as patient is thrombocytopenic  TOTAL TIME TAKING CARE OF THIS PATIENT:25 minutes.  >50% time spent on counselling and coordination of care  POSSIBLE D/C IN *?* DAYS, DEPENDING ON CLINICAL CONDITION.  Note: This dictation was prepared with Dragon dictation along with smaller phrase technology. Any transcriptional errors that result from this process are unintentional.   Fritzi Mandes M.D on 12/10/2018 at 1:17 PM  Between 7am to 6pm - Pager - [phone]  After 6pm go to [email or web address] - password [email or web address] [facility]  Office  [phone]  CC: Primary care physician; [facility], [facility] ServicesPatient ID: Kyle White, male   DOB: [birth year], 58 y.o.   MRN: [id number]

## 2018-12-10 NOTE — Progress Notes (Signed)
Follow up - Critical Care Medicine Note  Patient Details:     Spoke to sister [name] today.  Went over entire case and care plan. Discussed current prognosis and the failure to reach meaningful improvement despite aggressive care.  Sister was appreciative of discourse and will meet with Palliative care team tommorow.   Lines, Airways, Drains: CVC Triple Lumen 11/22/18 Right Internal jugular (Active)  Indication for Insertion or Continuance of Line Poor Vasculature-patient has had multiple peripheral attempts or PIVs lasting less than 24 hours 12/07/18 0750  Site Assessment Clean;Dry;Intact 12/07/18 0750  Proximal Lumen Status Infusing;Flushed;Blood return noted 12/07/18 0750  Medial Lumen Status Infusing;Flushed;Blood return noted 12/07/18 0750  Distal Lumen Status Infusing;Flushed;Blood return noted 12/07/18 0750  Dressing Type Transparent 12/07/18 0750  Dressing Status Clean;Dry;Intact;Antimicrobial disc in place 12/07/18 [place] checked and tightened 12/07/18 0750  Dressing Intervention New dressing;Dressing changed;Antimicrobial disc changed 12/06/18 0300  Dressing Change Due 12/13/18 12/07/18 0750     NG/OG Tube Nasogastric Right nare  Documented cm marking at nare/ corner of mouth (Active)  Cm Marking at Nare/Corner of Mouth (if applicable) 80 cm 16/10/96 1600  Site Assessment Clean;Dry;Intact 12/07/18 1600  Ongoing Placement Verification No change in respiratory status;No acute changes, not attributed to clinical condition;Xray 12/07/18 1600  Status Infusing tube feed 12/07/18 1600  Amount of suction 15 mmHg 12/06/18 1200  Drainage Appearance None 12/06/18 0758  Intake (mL) 200 mL 12/06/18 2200     Rectal Tube/Pouch (Active)  Date Prophylactic Dressing Applied (if applicable) 04/54/09 81/19/14 1600  Output (mL) 200 mL 12/06/18 1828  Intake (mL) 50 mL 12/07/18 0800     Urethral Catheter [name], RN Double-lumen 16 Fr. (Active)  Indication for  Insertion or Continuance of Catheter Therapy based on hourly urine output monitoring and documentation for critical condition (NOT STRICT I&O) 12/07/18 0400  Site Assessment Intact;Clean;Dry 12/07/18 0751  Catheter Maintenance Bag below level of bladder;Catheter secured;Drainage bag/tubing not touching floor;Insertion date on drainage bag;No dependent loops;Seal intact;Bag emptied prior to transport 12/07/18 0751  Collection Container Standard drainage bag 12/07/18 0751  Securement Method Securing device (Describe) 12/07/18 0751  Urinary Catheter Interventions (if applicable) Unclamped 78/29/56 0751  Input (mL) 0 mL 12/02/18 2000  Output (mL) 188 mL 12/07/18 1550    Anti-infectives:  Anti-infectives (From admission, onward)   Start     Dose/Rate Route Frequency Ordered Stop   12/08/18 1600  meropenem (MERREM) 1 g in sodium chloride 0.9 % 100 mL IVPB     1 g 200 mL/hr over 30 Minutes Intravenous Every 8 hours 12/08/18 1517     12/08/18 0600  meropenem (MERREM) 1 g in sodium chloride 0.9 % 100 mL IVPB  Status:  Discontinued     1 g 200 mL/hr over 30 Minutes Intravenous Every 12 hours 12/07/18 2059 12/08/18 1517   12/07/18 1800  meropenem (MERREM) 1 g in sodium chloride 0.9 % 100 mL IVPB  Status:  Discontinued     1 g 200 mL/hr over 30 Minutes Intravenous Every 8 hours 12/07/18 1716 12/07/18 2059   12/07/18 1715  vancomycin (VANCOCIN) IVPB 750 mg/150 ml premix  Status:  Discontinued     750 mg 150 mL/hr over 60 Minutes Intravenous  Once 12/07/18 1706 12/07/18 1721   12/07/18 1500  vancomycin (VANCOCIN) 1,250 mg in sodium chloride 0.9 % 250 mL IVPB     1,250 mg 166.7 mL/hr over 90 Minutes Intravenous  Once 12/07/18 1403 12/08/18 0934   12/06/18 1400  vancomycin (VANCOCIN) 1,250 mg in sodium chloride 0.9 % 250 mL IVPB  Status:  Discontinued     1,250 mg 166.7 mL/hr over 90 Minutes Intravenous  Once 12/06/18 1240 12/06/18 1420   12/18/2018 1500  vancomycin (VANCOCIN) 1,250 mg in sodium  chloride 0.9 % 250 mL IVPB     1,250 mg 166.7 mL/hr over 90 Minutes Intravenous  Once 12/01/2018 1411 12/03/2018 1640   12/03/18 1513  vancomycin variable dose per unstable renal function (pharmacist dosing)  Status:  Discontinued      Does not apply See admin instructions 12/03/18 1514 12/07/18 1527   12/03/18 1015  vancomycin (VANCOCIN) 2,000 mg in sodium chloride 0.9 % 500 mL IVPB     2,000 mg 250 mL/hr over 120 Minutes Intravenous  Once 12/03/18 1009 12/03/18 1346   12/02/18 0945  piperacillin-tazobactam (ZOSYN) IVPB 3.375 g  Status:  Discontinued     3.375 g 12.5 mL/hr over 240 Minutes Intravenous Every 8 hours 12/02/18 0938 12/03/18 1009   11/30/18 1800  meropenem (MERREM) 500 mg in sodium chloride 0.9 % 100 mL IVPB  Status:  Discontinued     500 mg 200 mL/hr over 30 Minutes Intravenous Daily-1800 11/29/18 1500 11/30/18 0901   11/30/18 1000  cefTRIAXone (ROCEPHIN) 2 g in sodium chloride 0.9 % 100 mL IVPB  Status:  Discontinued     2 g 200 mL/hr over 30 Minutes Intravenous Every 24 hours 11/30/18 0852 12/02/18 0934   11/28/18 1530  vancomycin (VANCOCIN) 1,250 mg in sodium chloride 0.9 % 250 mL IVPB  Status:  Discontinued     1,250 mg 166.7 mL/hr over 90 Minutes Intravenous Every 24 hours 11/28/18 1510 11/29/18 1553   11/28/18 1500  vancomycin (VANCOCIN) 1,250 mg in sodium chloride 0.9 % 250 mL IVPB  Status:  Discontinued     1,250 mg 166.7 mL/hr over 90 Minutes Intravenous Every 24 hours 11/28/18 1458 11/28/18 1510   11/28/18 1215  meropenem (MERREM) 1 g in sodium chloride 0.9 % 100 mL IVPB  Status:  Discontinued     1 g 200 mL/hr over 30 Minutes Intravenous Every 12 hours 11/28/18 1206 11/29/18 1500   11/27/18 1500  vancomycin (VANCOCIN) 1,750 mg in sodium chloride 0.9 % 500 mL IVPB  Status:  Discontinued     1,750 mg 250 mL/hr over 120 Minutes Intravenous Every 24 hours 11/27/18 1355 11/28/18 0726   11/26/18 1100  vancomycin (VANCOCIN) 2,500 mg in sodium chloride 0.9 % 500 mL IVPB      2,500 mg 250 mL/hr over 120 Minutes Intravenous  Once 11/26/18 1040 11/26/18 1315   11/26/18 1058  vancomycin variable dose per unstable renal function (pharmacist dosing)  Status:  Discontinued      Does not apply See admin instructions 11/26/18 1058 11/27/18 1658   11/22/18 1015  ceFEPIme (MAXIPIME) 2 g in sodium chloride 0.9 % 100 mL IVPB  Status:  Discontinued     2 g 200 mL/hr over 30 Minutes Intravenous Every 8 hours 11/22/18 1009 11/27/18 1027      Microbiology: Results for orders placed or performed during the hospital encounter of 11/17/2018  SARS CORONAVIRUS 2 (TAT 6-12 HRS) Nasal Swab Aptima Multi Swab     Status: None   Collection Time: 11/19/2018  4:55 PM   Specimen: Aptima Multi Swab; Nasal Swab  Result Value Ref Range Status   SARS Coronavirus 2 NEGATIVE NEGATIVE Final    Comment: (NOTE) SARS-CoV-2 target nucleic acids are NOT DETECTED. The SARS-CoV-2 RNA is  generally detectable in upper and lower respiratory specimens during the acute phase of infection. Negative results do not preclude SARS-CoV-2 infection, do not rule out co-infections with other pathogens, and should not be used as the sole basis for treatment or other patient management decisions. Negative results must be combined with clinical observations, patient history, and epidemiological information. The expected result is Negative. Fact Sheet for Patients: SugarRoll.be Fact Sheet for Healthcare Providers: https://www.woods-mathews.com/ This test is not yet approved or cleared by the [place] FDA and  has been authorized for detection and/or diagnosis of SARS-CoV-2 by FDA under an Emergency Use Authorization (EUA). This EUA will remain  in effect (meaning this test can be used) for the duration of the COVID-19 declaration under Section 56 4(b)(1) of the Act, 21 U.S.C. section 360bbb-3(b)(1), unless the authorization is terminated or revoked sooner. Performed  at [facility], [place] [address], [place],  [address]   SARS Coronavirus 2 [facility] order, Performed in [facility] hospital lab) Nasopharyngeal Nasopharyngeal Swab     Status: None   Collection Time: 10/28/2018 10:34 PM   Specimen: Nasopharyngeal Swab  Result Value Ref Range Status   SARS Coronavirus 2 NEGATIVE NEGATIVE Final    Comment: (NOTE) If result is NEGATIVE SARS-CoV-2 target nucleic acids are NOT DETECTED. The SARS-CoV-2 RNA is generally detectable in upper and lower  respiratory specimens during the acute phase of infection. The lowest  concentration of SARS-CoV-2 viral copies this assay can detect is 250  copies / mL. A negative result does not preclude SARS-CoV-2 infection  and should not be used as the sole basis for treatment or other  patient management decisions.  A negative result may occur with  improper specimen collection / handling, submission of specimen other  than nasopharyngeal swab, presence of viral mutation(s) within the  areas targeted by this assay, and inadequate number of viral copies  (<250 copies / mL). A negative result must be combined with clinical  observations, patient history, and epidemiological information. If result is POSITIVE SARS-CoV-2 target nucleic acids are DETECTED. The SARS-CoV-2 RNA is generally detectable in upper and lower  respiratory specimens dur ing the acute phase of infection.  Positive  results are indicative of active infection with SARS-CoV-2.  Clinical  correlation with patient history and other diagnostic information is  necessary to determine patient infection status.  Positive results do  not rule out bacterial infection or co-infection with other viruses. If result is PRESUMPTIVE POSTIVE SARS-CoV-2 nucleic acids MAY BE PRESENT.   A presumptive positive result was obtained on the submitted specimen  and confirmed on repeat testing.  While 2019 novel coronavirus  (SARS-CoV-2) nucleic acids may be present  in the submitted sample  additional confirmatory testing may be necessary for epidemiological  and / or clinical management purposes  to differentiate between  SARS-CoV-2 and other Sarbecovirus currently known to infect humans.  If clinically indicated additional testing with an alternate test  methodology [phone]) is advised. The SARS-CoV-2 RNA is generally  detectable in upper and lower respiratory sp ecimens during the acute  phase of infection. The expected result is Negative. Fact Sheet for Patients:  StrictlyIdeas.no Fact Sheet for Healthcare Providers: BankingDealers.co.za This test is not yet approved or cleared by the [place] FDA and has been authorized for detection and/or diagnosis of SARS-CoV-2 by FDA under an Emergency Use Authorization (EUA).  This EUA will remain in effect (meaning this test can be used) for the duration of the COVID-19 declaration  under Section 564(b)(1) of the Act, 21 U.S.C. section 360bbb-3(b)(1), unless the authorization is terminated or revoked sooner. Performed at [facility], [address] [place], [place] [address]   Culture, respiratory (non-expectorated)     Status: None   Collection Time: 11/14/2018 10:34 PM   Specimen: Tracheal Aspirate; Respiratory  Result Value Ref Range Status   Specimen Description   Final    TRACHEAL ASPIRATE Performed at [facility], [address] [place], [place] [address]    Special Requests   Final    NONE Performed at [facility], [address] [place], [place] [address]    Gram Stain   Final    RARE WBC PRESENT, PREDOMINANTLY PMN ABUNDANT GRAM POSITIVE COCCI IN CHAINS    Culture   Final    Consistent with normal respiratory flora. Performed at [facility], [place] [address], [place], [place] [address]    Report Status 11/19/2018 FINAL  Final  Urine Culture     Status: None   Collection Time: 11/12/2018 11:12 PM    Specimen: Urine, Random  Result Value Ref Range Status   Specimen Description   Final    URINE, RANDOM Performed at [facility], [address] [place], [place] [address]    Special Requests   Final    NONE Performed at [facility], [address] [place], [place] [address]    Culture   Final    NO GROWTH Performed at [facility], [place] [address], [place], [place] [address]    Report Status 11/18/2018 FINAL  Final  MRSA PCR Screening     Status: None   Collection Time: 11/19/2018 11:28 PM   Specimen: Nasopharyngeal  Result Value Ref Range Status   MRSA by PCR NEGATIVE NEGATIVE Final    Comment:        The GeneXpert MRSA Assay (FDA approved for NASAL specimens only), is one component of a comprehensive MRSA colonization surveillance program. It is not intended to diagnose MRSA infection nor to guide or monitor treatment for MRSA infections. Performed at [facility], [address] [place], [place] [address]   Culture, blood (Routine X 2) w Reflex to ID Panel     Status: None   Collection Time: 11/17/18 12:19 AM   Specimen: BLOOD  Result Value Ref Range Status   Specimen Description BLOOD RIGHT ANTECUBITAL  Final   Special Requests   Final    BOTTLES DRAWN AEROBIC AND ANAEROBIC Blood Culture adequate volume   Culture   Final    NO GROWTH 5 DAYS Performed at [facility], [address] [place], [place] [address]    Report Status 11/22/2018 FINAL  Final  Culture, blood (Routine X 2) w Reflex to ID Panel     Status: None   Collection Time: 11/17/18 12:31 AM   Specimen: BLOOD  Result Value Ref Range Status   Specimen Description BLOOD CENTRAL LINE MIDLINE  Final   Special Requests   Final    BOTTLES DRAWN AEROBIC AND ANAEROBIC Blood Culture adequate volume   Culture   Final    NO GROWTH 5 DAYS Performed at [facility], [address], [place], [place] [address]    Report Status 11/22/2018 FINAL  Final   Culture, bal-quantitative     Status: Abnormal   Collection Time: 11/22/18  7:48 AM   Specimen: Bronchoalveolar Lavage; Respiratory  Result Value Ref Range Status   Specimen Description  Final    BRONCHIAL ALVEOLAR LAVAGE Performed at [facility], [address] [place], [place] [address]    Special Requests   Final    Immunocompromised Performed at [facility], [place], [place] [address]    Gram Stain   Final    FEW WBC PRESENT, PREDOMINANTLY PMN FEW GRAM POSITIVE COCCI IN PAIRS IN CLUSTERS FEW GRAM POSITIVE RODS Performed at [facility], [place] [address], [place], [place] [address]    Culture (A)  Final    >=100,000 COLONIES/mL STAPHYLOCOCCUS SIMULANS >=100,000 COLONIES/mL STREPTOCOCCUS MITIS/ORALIS    Report Status 11/25/2018 FINAL  Final   Organism ID, Bacteria STAPHYLOCOCCUS SIMULANS (A)  Final   Organism ID, Bacteria STREPTOCOCCUS MITIS/ORALIS (A)  Final      Susceptibility   Staphylococcus simulans - MIC*    CIPROFLOXACIN <=0.5 SENSITIVE Sensitive     ERYTHROMYCIN <=0.25 SENSITIVE Sensitive     GENTAMICIN <=0.5 SENSITIVE Sensitive     OXACILLIN <=0.25 SENSITIVE Sensitive     TETRACYCLINE >=16 RESISTANT Resistant     VANCOMYCIN <=0.5 SENSITIVE Sensitive     TRIMETH/SULFA <=10 SENSITIVE Sensitive     CLINDAMYCIN <=0.25 SENSITIVE Sensitive     RIFAMPIN <=0.5 SENSITIVE Sensitive     Inducible Clindamycin NEGATIVE Sensitive     * >=100,000 COLONIES/mL STAPHYLOCOCCUS SIMULANS   Streptococcus mitis/oralis - MIC*    TETRACYCLINE >=16 RESISTANT Resistant     VANCOMYCIN 0.25 SENSITIVE Sensitive     CLINDAMYCIN 0.5 INTERMEDIATE Intermediate     * >=100,000 COLONIES/mL STREPTOCOCCUS MITIS/ORALIS  CULTURE, BLOOD (ROUTINE X 2) w Reflex to ID Panel     Status: None   Collection Time: 11/26/18 10:32 PM   Specimen: BLOOD  Result Value Ref Range Status   Specimen Description BLOOD RIGHT ANTECUBITAL  Final   Special Requests   Final     BOTTLES DRAWN AEROBIC AND ANAEROBIC Blood Culture adequate volume   Culture   Final    NO GROWTH 5 DAYS Performed at [facility], [address] [place], [place] [address]    Report Status 12/01/2018 FINAL  Final  CULTURE, BLOOD (ROUTINE X 2) w Reflex to ID Panel     Status: None   Collection Time: 11/26/18 10:32 PM   Specimen: BLOOD  Result Value Ref Range Status   Specimen Description BLOOD BLOOD RIGHT HAND  Final   Special Requests   Final    BOTTLES DRAWN AEROBIC AND ANAEROBIC Blood Culture adequate volume   Culture   Final    NO GROWTH 5 DAYS Performed at [facility], [address] [place], [place] [address]    Report Status 12/01/2018 FINAL  Final  Respiratory Panel by PCR     Status: None   Collection Time: 11/28/18 10:36 AM   Specimen: Nasopharyngeal Swab; Respiratory  Result Value Ref Range Status   Adenovirus NOT DETECTED NOT DETECTED Final   Coronavirus 229E NOT DETECTED NOT DETECTED Final    Comment: (NOTE) The Coronavirus on the Respiratory Panel, DOES NOT test for the novel  Coronavirus (2019 nCoV)    Coronavirus HKU1 NOT DETECTED NOT DETECTED Final   Coronavirus NL63 NOT DETECTED NOT DETECTED Final   Coronavirus OC43 NOT DETECTED NOT DETECTED Final   Metapneumovirus NOT DETECTED NOT DETECTED Final   Rhinovirus / Enterovirus NOT DETECTED NOT DETECTED Final   Influenza A NOT DETECTED NOT DETECTED Final   Influenza B NOT DETECTED NOT DETECTED Final   Parainfluenza Virus 1  NOT DETECTED NOT DETECTED Final   Parainfluenza Virus 2 NOT DETECTED NOT DETECTED Final   Parainfluenza Virus 3 NOT DETECTED NOT DETECTED Final   Parainfluenza Virus 4 NOT DETECTED NOT DETECTED Final   Respiratory Syncytial Virus NOT DETECTED NOT DETECTED Final   Bordetella pertussis NOT DETECTED NOT DETECTED Final   Chlamydophila pneumoniae NOT DETECTED NOT DETECTED Final   Mycoplasma pneumoniae NOT DETECTED NOT DETECTED Final    Comment: Performed at [facility], [place] [address], [place], [place] [address]  Culture, respiratory (non-expectorated)     Status: None   Collection Time: 11/28/18 11:29 AM   Specimen: Tracheal Aspirate; Respiratory  Result Value Ref Range Status   Specimen Description   Final    TRACHEAL ASPIRATE Performed at [facility], [address] [place], [place] [address]    Special Requests   Final    NONE Performed at [facility], [address] [place], [place] [address]    Gram Stain   Final    FEW WBC PRESENT, PREDOMINANTLY PMN FEW SQUAMOUS EPITHELIAL CELLS PRESENT RARE GRAM POSITIVE COCCI IN PAIRS    Culture   Final    FEW Consistent with normal respiratory flora. Performed at [facility], [place] [address], [place], [place] [address]    Report Status 11/30/2018 FINAL  Final  Culture, respiratory (non-expectorated)     Status: None   Collection Time: 12/02/18  8:45 AM   Specimen: Tracheal Aspirate; Respiratory  Result Value Ref Range Status   Specimen Description   Final    TRACHEAL ASPIRATE Performed at [facility] [address] [place], [place] [address]    Special Requests   Final    NONE Performed at [facility], [address] [place], [place] [address]    Gram Stain   Final    NO WBC SEEN NO ORGANISMS SEEN Performed at [facility], [place] [address], [place], [place] [address]    Culture FEW CANDIDA ALBICANS  Final   Report Status 12/04/2018 FINAL  Final  Urine Culture     Status: None   Collection Time: 12/07/18  3:16 PM   Specimen: Urine, Random  Result Value Ref Range Status   Specimen Description   Final    URINE, RANDOM Performed at [facility], [address], [place], [place] [address]    Special Requests   Final    NONE Performed at [facility], [address], [place], [place] [address]    Culture   Final    NO GROWTH Performed at [facility], [place] [address] [place], [place] [address]     Report Status 12/08/2018 FINAL  Final  Culture, respiratory (non-expectorated)     Status: None   Collection Time: 12/07/18  3:24 PM   Specimen: Tracheal Aspirate; Respiratory  Result Value Ref Range Status   Specimen Description   Final    TRACHEAL ASPIRATE Performed at [facility], [address] [place], [place] [address]    Special Requests   Final    NONE Performed at [facility], [address] [place], [place] [address]    Gram Stain   Final    FEW WBC PRESENT, PREDOMINANTLY PMN RARE YEAST RARE GRAM POSITIVE COCCI IN CLUSTERS Performed at [facility], [place] [address], [place],  [address]    Culture FEW CANDIDA ALBICANS  Final   Report Status 12/09/2018 FINAL  Final  CULTURE, BLOOD (ROUTINE X 2) w Reflex to ID Panel     Status: None (Preliminary result)   Collection Time: 12/07/18  3:27 PM   Specimen: BLOOD  Result Value Ref Range Status   Specimen Description BLOOD PORT  Final   Special Requests   Final    BOTTLES DRAWN AEROBIC AND ANAEROBIC Blood Culture adequate volume   Culture   Final    NO GROWTH 2 DAYS Performed at [facility], [address] [place], [place] [address]    Report Status PENDING  Incomplete  CULTURE, BLOOD (ROUTINE X 2) w Reflex to ID Panel     Status: None (Preliminary result)   Collection Time: 12/07/18  3:30 PM   Specimen: BLOOD  Result Value Ref Range Status   Specimen Description BLOOD RT HAND  Final   Special Requests   Final    BOTTLES DRAWN AEROBIC AND ANAEROBIC Blood Culture adequate volume   Culture   Final    NO GROWTH 2 DAYS Performed at [facility], [address] [place], [place] [address]    Report Status PENDING  Incomplete    Best Practice/Protocols:  VTE Prophylaxis: Mechanical GI Prophylaxis: Proton Pump Inhibitor  Intermittent Sedation Hyperglycemia (ICU)  Events: 08/28 admitted to ICU/SDU for BiPAP. PCCM consultation 08/28 emergently intubated 08/29  Echocardiogram: EF 55-60%. LV cavity mildly dilated. Diastolic function could not be evaluated. Left atrium mildly dilated. RV not assessed. RA size not well visualized. 08/29 CTAP: No acute intra-abdominal process noted. No perforation. Trace ascites. Severe hepatic steatosis. Cholelithiasis. Small R and trace L pleural effusions. Bilateral lower lobe airspace disease may represent atelectasis but pneumonia is difficult to exclude, particularly on the R. 08/30 Extubated 08/30 Cardiology consultation: recommended diltiazem gtt for rapid AF and continue anticoagulation 08/31 reintubated emergently 08/31 bronchoscopy for mucous plugging and complete collapse of R lung 08/31 Palliative Care consultation to address goals of care 09/03 DNR established 09/05 Nephrology consultation for AKI 09/06 Renal JO:INOMVE appearance of the kidneys 09/06 requiring 100% FiO2 and PEEP 15 cm H2O 09/07 ETT cuff leak, ETT changed 09/08 CTH: No acute intracranial abnormality seen 09/10 HD cath placed and intermittent HD initiated 09/11 + F/C. Improving vent requirements. 2 hrs of HD performed 09/12 improving vent requirements. Low-dose clonazepam and Duragesic patch initiated. Goals of care discussion with patient's sister, consent for tracheostomy tube. 09/13 Severe desaturation with increased purulent respiratory secretions and volume loss due to mucus plugging.Respiratory culture obtained. Antibiotics expanded to piperacillin-tazobactam. DNR confirmed with patient's sister. Continuous sedation infusions initiated 9/14 ETT dislodged, s/p bronch to adjust ETT, extubated and re-intubated Patient has tracheomegaly, plan for trach 9/15 remains on high fio2 9/16 s/p trach 9/17 did not tolerate dialysis due to hemodynamic instability.  Persistent shock physiology on pressors 9/18 re-spiked temperatures, culture sputum, Merrem restarted  Studies: Ct Abdomen Pelvis Wo Contrast  Result Date:  11/17/2018 CLINICAL DATA:  Abdominal rigidity. Concern for perforated viscus. Currently intubated. EXAM: CT ABDOMEN AND PELVIS WITHOUT CONTRAST TECHNIQUE: Multidetector CT imaging of the abdomen and pelvis was performed following the standard protocol without IV contrast. COMPARISON:  None. FINDINGS: Lower chest: Small right and trace left pleural effusions with bilateral lower lobe airspace disease. Small amount of herniated right middle lobe between the fifth and sixth ribs. Hepatobiliary: Severe hepatic steatosis. Several small gallstones. No gallbladder wall thickening or biliary dilatation. Pancreas: Unremarkable. No pancreatic ductal dilatation or surrounding inflammatory changes. Spleen: Normal in size without focal abnormality. Adrenals/Urinary Tract: Adrenal glands are  unremarkable. Punctate calculus in the lower pole the left kidney. No hydronephrosis. Bladder is decompressed by Foley catheter. Stomach/Bowel: Enteric tube tip in the distal stomach. Stomach is within normal limits. Appendix appears normal. No evidence of bowel wall thickening, distention, or inflammatory changes. Vascular/Lymphatic: Aortic atherosclerosis. No enlarged abdominal or pelvic lymph nodes. Reproductive: Prostate is unremarkable. Other: Trace ascites.  No pneumoperitoneum. Musculoskeletal: No acute or significant osseous findings. IMPRESSION: 1.  No acute intra-abdominal process.  No perforation. 2. Trace ascites. 3. Severe hepatic steatosis. 4. Cholelithiasis. 5. Punctate nonobstructive left nephrolithiasis. 6. Small right and trace left pleural effusions. Bilateral lower lobe airspace disease may represent atelectasis, but pneumonia is difficult to exclude, particularly on the right. Electronically Signed   By: Titus Dubin M.D.   On: 11/17/2018 13:50   Dg Abd 1 View  Result Date: 12/03/2018 CLINICAL DATA:  Check gastric catheter placement EXAM: ABDOMEN - 1 VIEW COMPARISON:  Film from earlier in the same day. FINDINGS:  Gastric catheter has been advanced several cm and now lies with the tip in the distal stomach. IMPRESSION: Gastric catheter within the stomach. Electronically Signed   By: Inez Catalina M.D.   On: 12/03/2018 20:50   Dg Abd 1 View  Result Date: 12/03/2018 CLINICAL DATA:  Check gastric catheter placement EXAM: ABDOMEN - 1 VIEW COMPARISON:  11/30/2018 FINDINGS: Gastric catheter is noted within the distal esophagus but does not appear to have reached the gastric lumen. Scattered large and small bowel gas is noted. IMPRESSION: Gastric catheter within the distal esophagus. This should be advanced several cm. Electronically Signed   By: Inez Catalina M.D.   On: 12/03/2018 20:50   Dg Abd 1 View  Result Date: 11/30/2018 CLINICAL DATA:  OG tube placement. EXAM: ABDOMEN - 1 VIEW COMPARISON:  11/26/2018 FINDINGS: 1329 hours. Lower left chest and upper left abdomen have been included in the field of view. NG tube tip is identified in the mid stomach. Cardiopericardial silhouette appears enlarged with left base retrocardiac collapse/consolidation. IMPRESSION: OG tube tip is in the mid stomach. Electronically Signed   By: Misty Stanley M.D.   On: 11/30/2018 15:21   Dg Abd 1 View  Result Date: 11/26/2018 CLINICAL DATA:  ET and NG tube placement. CXR approved by MD at bedside. MD states no repeat necessary. Best attainable images due to patient conditions. EXAM: ABDOMEN - 1 VIEW COMPARISON:  Abdominal radiograph 11/19/2018 FINDINGS: The nasogastric tube distal aspect is coiled overlying the stomach. Paucity of bowel gas. No supine evidence for free intraperitoneal air. Left retrocardiac opacity and probable small effusion. Probable right basilar atelectasis. IMPRESSION: The nasogastric tube side port projects over the stomach. Electronically Signed   By: Audie Pinto M.D.   On: 11/26/2018 16:48   Dg Abd 1 View  Result Date: 11/17/2018 CLINICAL DATA:  Abdomen distension tube placement EXAM: ABDOMEN - 1 VIEW  COMPARISON:  11/06/2018 FINDINGS: Esophageal tube tip visible to the distal stomach but incompletely included. Airspace disease at the right base. IMPRESSION: Esophageal tubing visible to the gastric body but tip is incompletely included in the field of view. Electronically Signed   By: Donavan Foil M.D.   On: 11/17/2018 00:10   Dg Abd 1 View  Result Date: 11/10/2018 CLINICAL DATA:  Abdominal distention EXAM: ABDOMEN - 1 VIEW COMPARISON:  None. FINDINGS: Limited study due to portable nature of the study and body habitus. I see no significant bowel dilatation or free air. Study otherwise limited. IMPRESSION: Very limited study. No visible  changes of bowel obstruction or free air. Electronically Signed   By: Rolm Baptise M.D.   On: 11/06/2018 22:33   Ct Head Wo Contrast  Result Date: 11/27/2018 CLINICAL DATA:  Altered level of consciousness. EXAM: CT HEAD WITHOUT CONTRAST TECHNIQUE: Contiguous axial images were obtained from the base of the skull through the vertex without intravenous contrast. COMPARISON:  None. FINDINGS: Brain: No evidence of acute infarction, hemorrhage, hydrocephalus, extra-axial collection or mass lesion/mass effect. Vascular: No hyperdense vessel or unexpected calcification. Skull: Normal. Negative for fracture or focal lesion. Sinuses/Orbits: Fluid is noted in bilateral mastoid air cells. Bilateral ethmoid and sphenoid sinusitis is noted. Other: None. IMPRESSION: Bilateral ethmoid and sphenoid sinusitis. No acute intracranial abnormality seen. Electronically Signed   By: Marijo Conception M.D.   On: 11/27/2018 15:50   US Renal  Result Date: 11/25/2018 CLINICAL DATA:  Acute renal failure. EXAM: RENAL / URINARY TRACT ULTRASOUND COMPLETE COMPARISON:  Body CT November 17, 2018 FINDINGS: Right Kidney: Renal measurements: 12.2 x 6.9 x 7.5 cm = volume: 334 mL . Echogenicity within normal limits. No mass or hydronephrosis visualized. Left Kidney: Renal measurements: 14.9 x 6.5 x 6.7 cm = volume:  341 mL. Echogenicity within normal limits. No mass or hydronephrosis visualized. Bladder: Decompressed around urinary Foley and therefore not well evaluated. IMPRESSION: Normal appearance of the kidneys, accounting for the technical limitations of the study caused by body habitus. Electronically Signed   By: Fidela Salisbury M.D.   On: 11/25/2018 15:43   Dg Chest Port 1 View  Result Date: 12/07/2018 CLINICAL DATA:  Respiratory failure EXAM: PORTABLE CHEST 1 VIEW COMPARISON:  December 03, 2018 FINDINGS: Limited due to patient position. Endotracheal tube tip is seen 4 cm above the carina. NG tube is seen coursing below the diaphragm. A right-sided central venous catheter seen at the superior cavoatrial junction. Hazy ground-glass and interstitial opacities seen throughout both lungs. There is interval improvement in the right lung aeration. Again noted is cardiomegaly and a probable small bilateral pleural effusions. IMPRESSION: 1. Support lines and tubes in satisfactory position. 2. Interval improvement in the right lung aeration. 3. Diffuse hazy ground-glass and interstitial opacities , which could be due to infection/atelectasis. 4. Small bilateral pleural effusions Electronically Signed   By: Prudencio Pair M.D.   On: 12/07/2018 15:23   Dg Chest Port 1 View  Result Date: 12/03/2018 CLINICAL DATA:  Check endotracheal tube placement EXAM: PORTABLE CHEST 1 VIEW COMPARISON:  Film from earlier in the same day. FINDINGS: Endotracheal tube has been advanced and now lies approximately 2.8 cm above the carina. Gastric catheter is again seen although the tip of the catheter is not appreciated on this exam. Bilateral jugular central lines are again seen and stable. Increasing opacification in the right hemithorax is noted likely related to a combination of consolidation and effusion. The left lung is clear with the exception of vascular congestion. IMPRESSION: Tubes and lines as described. Persistent vascular  congestion. Increasing right hemithorax opacification consistent with a combination of consolidation and effusion. Electronically Signed   By: Inez Catalina M.D.   On: 12/03/2018 20:49   Dg Chest Port 1 View  Result Date: 12/03/2018 CLINICAL DATA:  Endotracheal tube adjustment EXAM: PORTABLE CHEST 1 VIEW COMPARISON:  Chest radiograph from earlier today. FINDINGS: Endotracheal tube tip is 7.0 cm above the carina, at the level of thoracic inlet. Enteric tube enters stomach with the tip not seen on this image. Left internal jugular central venous catheter terminates in the  left brachiocephalic vein near the midline. Right internal jugular central venous catheter terminates in the middle third of the SVC. Stable cardiomediastinal silhouette with moderate cardiomegaly. No pneumothorax. Stable small bilateral pleural effusions, noting portion of the left costophrenic angle not included on this image. Moderate pulmonary edema appears similar. Bibasilar opacity, favor atelectasis, unchanged. IMPRESSION: 1. Endotracheal tube tip 7.0 cm above the carina, located at the level of the thoracic inlet. Additional support structures as detailed. No pneumothorax. 2. Stable moderate congestive heart failure with small bilateral pleural effusions and bibasilar lung opacities, favor atelectasis. Electronically Signed   By: Ilona Sorrel M.D.   On: 12/03/2018 19:48   Dg Chest Port 1 View  Result Date: 12/03/2018 CLINICAL DATA:  Check endotracheal tube placement EXAM: PORTABLE CHEST 1 VIEW COMPARISON:  12/03/2018 FINDINGS: Endotracheal tube is again noted at the level of the thoracic inlet. Gastric catheter is noted extending into the stomach. Bilateral jugular central lines are again seen and stable. Diffuse vascular congestion is noted. Mild interstitial edema is seen. Cardiomegaly is noted. Likely small posterior effusions are present as well. No bony abnormality is seen. IMPRESSION: Vascular congestion and edema with mild  effusions. Electronically Signed   By: Inez Catalina M.D.   On: 12/03/2018 19:19   Dg Chest Port 1 View  Result Date: 12/03/2018 CLINICAL DATA:  Respiratory failure. EXAM: PORTABLE CHEST 1 VIEW COMPARISON:  Radiograph of December 19, 2018. FINDINGS: Stable cardiomegaly. Endotracheal and nasogastric tubes are unchanged in position. Bilateral internal jugular catheters are noted which are unchanged in position. No pneumothorax is noted. Stable bibasilar atelectasis is noted with small pleural effusions. Bony thorax is unremarkable. IMPRESSION: Stable support apparatus. Stable bibasilar opacities as described above. Electronically Signed   By: Marijo Conception M.D.   On: 12/03/2018 07:41   Dg Chest Port 1 View  Result Date: 12/02/2018 CLINICAL DATA:  ET placement EXAM: PORTABLE CHEST 1 VIEW COMPARISON:  Same-day radiograph FINDINGS: Portion of the left lung base is collimated from view. Interval placement of the endotracheal tube within the upper trachea approximately 8 cm from the carina. Recommend advancement 3-4 cm to position in the mid trachea. Transesophageal tube tip and side port distal to the GE junction. Left IJ catheter tip terminates at the brachiocephalic-caval confluence. Right IJ catheter tip terminates in the lower SVC. Some improving aeration is noted in the right hemithorax. Persistent opacity is noted in both lung bases, right greater than left. Cardiomediastinal silhouette is still largely obscured. IMPRESSION: Interval placement of the endotracheal tube within the upper trachea approximately 8 cm from the carina. Recommend advancement 3-4 cm to position in the mid trachea. Remaining lines and tubes in stable position. Improving aeration of the right lung. These results will be called to the ordering clinician or representative by the Radiologist Assistant, and communication documented in the PACS or zVision Dashboard. Electronically Signed   By: Lovena Le M.D.   On: 12/02/2018 22:16    Dg Chest Port 1 View  Result Date: 12/02/2018 CLINICAL DATA:  Respiratory failure EXAM: PORTABLE CHEST 1 VIEW COMPARISON:  12/02/2018, 12/01/2018, 11/30/2018 FINDINGS: Endotracheal tube tip slightly above thoracic inlet and is 9.8 cm superior to the carina. Left IJ central venous catheter tip projects over the brachiocephalic confluence. Esophageal tube tip courses below diaphragm but is non included. Right IJ central venous catheter tip over the SVC. Interval diffuse white out of the right thorax. Truncated appearance of the right bronchus. Suspected moderate left pleural effusion with edema or layering  fluid on the left. Consolidation left base. Obscured cardiomediastinal silhouette. IMPRESSION: 1. Endotracheal tube tip slightly above thoracic inlet and is 9.8 cm superior to carina 2. Interval diffuse white out of the right thorax. Truncated appearance of the right bronchus suggestive of occlusive process. 3. Continued left pleural effusion with dense airspace disease at the left base. Electronically Signed   By: Donavan Foil M.D.   On: 12/02/2018 21:27   Dg Chest Port 1 View  Result Date: 12/02/2018 CLINICAL DATA:  Acute respiratory failure. EXAM: PORTABLE CHEST 1 VIEW COMPARISON:  12/01/2018 FINDINGS: [name] rotated to the right. Enteric tube courses into the stomach as tip is not definitely visualized. Endotracheal tube has tip approximately 7 cm above the carina. Left IJ central venous catheter unchanged as well as right IJ central venous catheter unchanged. Exam demonstrates worsening bilateral perihilar/bibasilar opacification. Findings likely due to layering bilateral effusions with bibasilar atelectasis as well as interstitial edema. Infection is possible. Cardiomegaly. Remainder of the exam is unchanged. IMPRESSION: Worsening bilateral perihilar bibasilar opacification likely worsening effusions and CHF. Infection is possible. Tubes and lines as described. Electronically Signed   By: Marin Olp M.D.   On: 12/02/2018 09:13   Dg Chest Port 1 View  Result Date: 12/01/2018 CLINICAL DATA:  Respiratory failure EXAM: PORTABLE CHEST 1 VIEW COMPARISON:  Numerous prior radiographs, most recently 11/30/2010 FINDINGS: Endotracheal tube is positioned within the upper trachea approximately 6 cm from the carina. Distal extent of the transesophageal tube is poorly visualized due to radiographic underpenetration. Left IJ approach central venous catheter tip terminates in the brachiocephalic vein. Right IJ catheter tip terminates near the superior cavoatrial junction. Redemonstration the bilateral pleural effusions which obscure the hemidiaphragms and resulting gradient basilar opacity. Bilateral airspace disease is grossly similar to comparison study accounting for differences in technique. There is redemonstration of the metallic cerclage wire associated with the right ribs. IMPRESSION: 1. Endotracheal tube tip approximately 6 cm from the carina. 2. Distal extent of the transesophageal tube is poorly visualized due to radiographic underpenetration. 3. Left IJ catheter terminates in the brachiocephalic vein. Right IJ catheter at the superior cavoatrial junction. 4. Grossly stable bilateral pleural effusions and bilateral airspace disease. Electronically Signed   By: Lovena Le M.D.   On: 12/01/2018 03:24   Dg Chest Port 1 View  Result Date: 11/30/2018 CLINICAL DATA:  Acute on chronic respiratory failure with severe hypoxia. ARDS with fever. Pneumonia. EXAM: PORTABLE CHEST 1 VIEW COMPARISON:  Single view of the chest 11/29/2018 and 11/28/2018. FINDINGS: Support tubes and lines are unchanged. Marked cardiomegaly is again seen. Left worse than right pleural effusions and airspace disease persist without change. IMPRESSION: No change in left worse than right pleural effusions and airspace disease. Marked cardiomegaly. No change in support apparatus. Electronically Signed   By: Inge Rise M.D.   On:  11/30/2018 08:37   Dg Chest Port 1 View  Result Date: 11/29/2018 CLINICAL DATA:  Central line placement. EXAM: PORTABLE CHEST 1 VIEW COMPARISON:  11/28/2018 FINDINGS: [name] rotated to the left. Endotracheal tube has tip 6.8 cm above the carina. Enteric tube courses into the stomach as tip is not visualized. Right IJ central venous catheter unchanged with tip over the SVC. Interval placement of left IJ central venous catheter with tip obliquely oriented over the left brachiocephalic vein in the midline. Lungs are adequately inflated with mild stable hazy density over the left base likely small left effusion with associated atelectasis. Mild stable prominence of the  perihilar markings likely mild vascular congestion. Moderate stable cardiomegaly. Remainder of the exam is unchanged. IMPRESSION: Moderate stable cardiomegaly with mild vascular congestion. Stable hazy density over the left base likely small effusion with associated atelectasis. Tubes and lines as described. Interval placement of left IJ central venous catheter with tip in the midline over the brachiocephalic vein. Electronically Signed   By: Marin Olp M.D.   On: 11/29/2018 15:44   Dg Chest Port 1 View  Result Date: 11/28/2018 CLINICAL DATA:  Fever, intubation EXAM: PORTABLE CHEST 1 VIEW COMPARISON:  11/26/2018 FINDINGS: No significant interval change in AP portable chest radiograph with endotracheal tube over the mid trachea, esophagogastric tube, right neck vascular catheter, cardiomegaly, and layering small bilateral pleural effusions. No new or focal airspace opacity. IMPRESSION: No significant interval change in AP portable chest radiograph with cardiomegaly and layering small pleural effusions. No new or focal airspace opacity. Unchanged support apparatus. Electronically Signed   By: Eddie Candle M.D.   On: 11/28/2018 12:47   Dg Chest Port 1 View  Result Date: 11/26/2018 CLINICAL DATA:  ET and NG tube placement. CXR approved by MD  at bedside. MD states no repeat necessary. Best attainable images due to patient conditions. EXAM: PORTABLE CHEST 1 VIEW COMPARISON:  Chest radiograph 11/24/2018, 11/22/2018 FINDINGS: Endotracheal tube tip terminates between the thoracic inlet and carina. The nasogastric tube courses below the diaphragm with nonvisualization of the distal tip. The right central venous catheter tip projects over the SVC. Cardiomegaly. No definite pneumothorax. Poor visualization of the bilateral lung bases but suspect persistent bibasilar opacities. No acute finding in the visualized skeleton. IMPRESSION: 1. Endotracheal tube tip terminates between the thoracic inlet and carina. 2. Nasogastric tube positioning is better seen on the subsequent abdominal radiograph. 3. Poor visualization of the lung bases but suspect ongoing bibasilar pulmonary opacities. Electronically Signed   By: Audie Pinto M.D.   On: 11/26/2018 16:44   Dg Chest Port 1 View  Result Date: 11/24/2018 CLINICAL DATA:  Shortness of breath EXAM: PORTABLE CHEST 1 VIEW COMPARISON:  November 22, 2018 FINDINGS: Endotracheal tube tip is seen 3.5 cm above the level of carina. NG tube is seen coursing below the diaphragm. A right-sided central venous catheter is seen within the mid SVC./slight interval improved aeration since prior exam. Probable subsegmental atelectasis remains at the lung bases. There is a retrocardiac opacity. IMPRESSION: Slight interval improvement in aeration. However there remains a retrocardiac opacity which could be atelectasis and/or infectious etiology. Electronically Signed   By: Prudencio Pair M.D.   On: 11/24/2018 03:51   Dg Chest Port 1 View  Result Date: 11/22/2018 CLINICAL DATA:  Ventilator support.  Follow-up. EXAM: PORTABLE CHEST 1 VIEW COMPARISON:  Earlier same day FINDINGS: Endotracheal tube tip is 3 cm above the carina. Orogastric or nasogastric tube enters the abdomen. Right internal jugular central line tip in the SVC above the  right atrium. Persistent bilateral lower lung atelectasis and or pneumonia. No worsening or new finding. IMPRESSION: No apparent change since earlier today. Lines and tubes well position. Persistent atelectasis and or pneumonia in the mid and lower lungs. Electronically Signed   By: Nelson Chimes M.D.   On: 11/22/2018 21:23   Dg Chest Port 1 View  Result Date: 11/22/2018 CLINICAL DATA:  Central line placement EXAM: PORTABLE CHEST 1 VIEW COMPARISON:  11/22/2018 FINDINGS: [name] rotated. Interval placement of a right IJ central venous catheter with distal tip terminating at the expected level of the superior cavoatrial junction.  ET tube remains in place with distal tip terminating 3.8 cm superior to the carina. Enteric tube courses below the diaphragm with distal tip beyond the inferior margin of the film. Multiple overlying external leads. Cardiac silhouette is largely obscured but appears enlarged. Bilateral pleural effusions. Volume loss within the right lung. Slight improved aeration of the right lung compared to prior. No pneumothorax visualized. IMPRESSION: 1. Interval placement of right IJ central venous catheter. No pneumothorax. 2. Improving aeration of the right lung. Electronically Signed   By: Davina Poke M.D.   On: 11/22/2018 14:26   Dg Chest Port 1 View  Result Date: 11/22/2018 CLINICAL DATA:  Multiple tracheobronchial mucus plugs, history CHF, diabetes mellitus, atrial fibrillation, hypertension EXAM: PORTABLE CHEST 1 VIEW COMPARISON:  Portable exam 0830 hours compared to 0035 hours FINDINGS: Tip of endotracheal tube projects 4.1 cm above carina. Nasogastric tube extends into abdomen. Volume loss in the RIGHT hemithorax with slight mediastinal shift to the RIGHT, likely reflecting a combination of atelectasis and question effusion. LEFT pleural effusion and basilar atelectasis are present. No pneumothorax. Bones demineralized. IMPRESSION: Atelectasis and probable pleural effusion opacified  RIGHT hemithorax with volume loss. Persistent LEFT pleural effusion and basilar atelectasis. Electronically Signed   By: Lavonia Dana M.D.   On: 11/22/2018 08:44   Dg Chest Port 1 View  Result Date: 11/22/2018 CLINICAL DATA:  Hypoxia. EXAM: PORTABLE CHEST 1 VIEW COMPARISON:  Radiograph yesterday at 0550 hour FINDINGS: Endotracheal tube tip 3.6 cm from the carina. Enteric tube in place tip not well visualized. Complete opacification of the right hemithorax which is new from prior exam. Heart size and mediastinal contours are obscured. Left pleural effusion with volume loss in the left lung. Congestive changes on the left. IMPRESSION: 1. Complete opacification of the right hemithorax which is new from prior exam. Question underlying mucous plugging. 2. Overall low lung volumes. 3. Support apparatus unchanged. Electronically Signed   By: Keith Rake M.D.   On: 11/22/2018 01:11   Dg Chest Port 1 View  Result Date: 11/21/2018 CLINICAL DATA:  Acute respiratory failure. EXAM: PORTABLE CHEST 1 VIEW COMPARISON:  Radiograph of November 19, 2018. FINDINGS: Stable cardiomegaly. Endotracheal and nasogastric tubes are unchanged in position. No pneumothorax is noted. Probable mild to moderate size left pleural effusion is noted with associated atelectasis or infiltrate. Minimal right basilar atelectasis may be present. Bony thorax unremarkable. IMPRESSION: Stable support apparatus. Increased left basilar density is noted concerning for mild to moderate pleural effusion with probable underlying atelectasis or infiltrate. Electronically Signed   By: Marijo Conception M.D.   On: 11/21/2018 08:39   Dg Chest Port 1 View  Result Date: 11/19/2018 CLINICAL DATA:  Respiratory failure. EXAM: PORTABLE CHEST 1 VIEW COMPARISON:  11/06/2018 prior radiographs FINDINGS: An endotracheal tube with tip 4 cm above the carina and NG tube entering the stomach with tip off the field of view again noted. Cardiomegaly, pulmonary vascular  congestion, small bilateral pleural effusions and bilateral LOWER lung atelectasis/consolidation again noted. There is no evidence of pneumothorax. IMPRESSION: Unchanged appearance of the chest with support apparatus as described. Cardiomegaly, pulmonary vascular congestion, small bilateral pleural effusions and bilateral LOWER lung atelectasis/consolidation. Electronically Signed   By: Margarette Canada M.D.   On: 11/19/2018 16:51   Dg Chest Port 1 View  Result Date: 11/17/2018 CLINICAL DATA:  Intubation EXAM: PORTABLE CHEST 1 VIEW COMPARISON:  11/17/2018, 10/28/2018 FINDINGS: Endotracheal tube tip is about 3.1 cm superior to the carina. Esophageal tube tip extends below  the diaphragm but is non included. Bilateral pleural effusions, likely layering on the right. Cardiomegaly with vascular congestion and bilateral pulmonary edema. Basilar consolidations. No pneumothorax. IMPRESSION: 1. Endotracheal tube tip about 3.1 cm superior to carina 2. Cardiomegaly with vascular congestion, pulmonary edema and bilateral pleural effusion Electronically Signed   By: Donavan Foil M.D.   On: 11/17/2018 00:12   Dg Chest Port 1 View  Result Date: 11/03/2018 CLINICAL DATA:  Shortness of breath for 2 weeks. EXAM: PORTABLE CHEST 1 VIEW COMPARISON:  11/04/2010 radiograph FINDINGS: Cardiomegaly with pulmonary vascular congestion noted. Possible mild interstitial opacities/edema noted. No pneumothorax or acute bony abnormality. Bibasilar atelectasis noted. There may be trace pleural effusions present. IMPRESSION: Cardiomegaly with pulmonary vascular congestion and possible mild interstitial edema. Bibasilar atelectasis.  Possible trace pleural effusions. Electronically Signed   By: Margarette Canada M.D.   On: 10/29/2018 17:13   Dg Abd Portable 1v  Result Date: 11/19/2018 CLINICAL DATA:  NG tube placement. EXAM: PORTABLE ABDOMEN - 1 VIEW COMPARISON:  None. FINDINGS: An NG tube is noted with tip overlying the distal stomach. IMPRESSION:  NG tube with tip overlying the distal stomach. Electronically Signed   By: Margarette Canada M.D.   On: 11/19/2018 16:51    Consults: Treatment Team:  Corey Skains, MD    Objective:  Vital signs for last 24 hours: Temp:  [97.6 F (36.4 C)-98.3 F (36.8 C)] 97.8 F (36.6 C) (09/21 0400) Pulse Rate:  [77-146] 120 (09/21 0600) Resp:  [20-31] 20 (09/21 0600) BP: (103-161)/(67-105) 118/87 (09/21 0600) SpO2:  [91 %-98 %] 92 % (09/21 0600) FiO2 (%):  [30 %] 30 % (09/21 0400) Weight:  [225 kg] 225 kg (09/21 0452)  Hemodynamic parameters for last 24 hours:    Intake/Output from previous day: 09/20 0701 - 09/21 0700 In: 4296.2 [I.V.:1001.2; NG/GT:3005; IV Piggyback:260] Out: 3365 [Urine:3115; Stool:250]  Intake/Output this shift: Total I/O In: 1011.8 [I.V.:516.8; NG/GT:495] Out: 1550 [Urine:1550]  Vent settings for last 24 hours: Vent Mode: PCV FiO2 (%):  [30 %] 30 % Set Rate:  [20 bmp] 20 bmp Vt Set:  [5 mL] 5 mL PEEP:  [6 cmH20] 6 cmH20 Plateau Pressure:  [20 LYY50-35 cmH20] 20 cmH20  Physical Exam:  GENERAL: Massively obese gentleman, poorly responsive on no sedation.  Thoracoabdominal asynchrony on PSV tracheostomy in place. HEAD: Normocephalic, atraumatic.  EYES: Pupils equal, round, reactive to light.  No scleral icterus.  There is scleral edema. MOUTH: Oral mucosa without thrush. NECK: Supple. Tracheostomy in place, clean PULMONARY: +rhonchi, no wheezing, scattered rales CARDIOVASCULAR: S1 and S2.  Irregular rhythm.  Tachycardic.  No murmurs, rubs, or gallops.  GASTROINTESTINAL: Soft, non--distended.  Protuberant/obese.  Positive bowel sounds.  MUSCULOSKELETAL: 3-4+ anasarca.  No mottling on toes and fingertips.  Onychomycosis NEUROLOGIC: Poorly responsive, more lethargic today. SKIN:intact,warm,dry  Assessment/Plan:  1. Acute on chronic hypoxic/hypercapnic respiratory failure with prolonged ventilator dependence:   Strep and staph pneumonia - failed liberation  off MV   - discussed with sister today, she will meet with brother and GF today and have meeting with [place] tommorow regarding [place]   3. Acute on chronic renal failure KDIGO 3     -Unable to tolerate HD    - >30L +     Atrial fibrillation with rapid ventricular response: He is on amiodarone, required rebolus.  Monitor.  Back to A. fib with RVR today.  Exceedingly poor prognosis.  7.  Type 2 diabetes with persistent hyperglycemia: ICU hyperglycemia protocol.  8.  Thrombocytopenia, persistent: HIT antibodies negative.  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  Discontinued aspirin due to worsening thrombocytopenia.  No need for transfusion yet but will continue to monitor for potential signs of bleeding.  9.  Severe protein malnutrition (albumin 1.5) in the setting of massive obesity (sarcopenic obesity): He is receiving nutrition, on albumin with some decrease in third spacing.   Patient remains DNR.[name] prognosis is exceedingly poor.  This was made very clear to the patient's sister today.  She remains noncommittal as to goals of care.  Continue to advocate for comfort measures.   LOS: 24 days   Additional comments: None  Critical Care Total Time*: 33 Minutes    Ottie Glazier, M.D.  Pulmonary & Critical Care Medicine  [place] during the described time interval was provided by me and/or other providers on the critical care team.  I have reviewed this patient's available data, including medical history, events of note, physical examination and test results as part of my evaluation.

## 2018-12-10 NOTE — Progress Notes (Signed)
PMT consult received and chart reviewed. Discussed with RN. [name] family has had multiple discussions with PCCM providers and ready to speak with palliative provider. Introduced myself to sister, [name] at bedside. We plan to meet tomorrow, 9/22 at 11am. Her brother will also be present. Thank you.   NO CHARGE  Ihor Dow, [place], FNP-C Palliative Medicine Team  Phone: [phone] Fax: [phone]

## 2018-12-11 DIAGNOSIS — N179 Acute kidney failure, unspecified: Secondary | ICD-10-CM

## 2018-12-11 DIAGNOSIS — J9809 Other diseases of bronchus, not elsewhere classified: Secondary | ICD-10-CM

## 2018-12-11 DIAGNOSIS — T17800A Unspecified foreign body in other parts of respiratory tract causing asphyxiation, initial encounter: Secondary | ICD-10-CM

## 2018-12-11 DIAGNOSIS — J9621 Acute and chronic respiratory failure with hypoxia: Secondary | ICD-10-CM

## 2018-12-11 DIAGNOSIS — I509 Heart failure, unspecified: Secondary | ICD-10-CM

## 2018-12-11 LAB — BASIC METABOLIC PANEL
Anion gap: 8 (ref 5–15)
BUN: 103 mg/dL — ABNORMAL HIGH (ref 6–20)
CO2: 24 mmol/L (ref 22–32)
Calcium: 8 mg/dL — ABNORMAL LOW (ref 8.9–10.3)
Chloride: 117 mmol/L — ABNORMAL HIGH (ref 98–111)
Creatinine, Ser: 1.99 mg/dL — ABNORMAL HIGH (ref 0.61–1.24)
GFR calc Af Amer: 41 mL/min — ABNORMAL LOW (ref >60)
GFR calc non Af Amer: 36 mL/min — ABNORMAL LOW (ref >60)
Glucose, Bld: 199 mg/dL — ABNORMAL HIGH (ref 70–99)
Potassium: 3.6 mmol/L (ref 3.5–5.1)
Sodium: 149 mmol/L — ABNORMAL HIGH (ref 135–145)

## 2018-12-11 LAB — MAGNESIUM: Magnesium: 2.3 mg/dL (ref 1.7–2.4)

## 2018-12-11 LAB — CORTISOL: Cortisol, Plasma: 25.1 ug/dL

## 2018-12-11 LAB — GLUCOSE, CAPILLARY
Glucose-Capillary: 136 mg/dL — ABNORMAL HIGH (ref 70–99)
Glucose-Capillary: 154 mg/dL — ABNORMAL HIGH (ref 70–99)
Glucose-Capillary: 166 mg/dL — ABNORMAL HIGH (ref 70–99)
Glucose-Capillary: 189 mg/dL — ABNORMAL HIGH (ref 70–99)
Glucose-Capillary: 199 mg/dL — ABNORMAL HIGH (ref 70–99)
Glucose-Capillary: 213 mg/dL — ABNORMAL HIGH (ref 70–99)
Glucose-Capillary: 220 mg/dL — ABNORMAL HIGH (ref 70–99)

## 2018-12-11 LAB — CBC
HCT: 26.9 % — ABNORMAL LOW (ref 39.0–52.0)
Hemoglobin: 8 g/dL — ABNORMAL LOW (ref 13.0–17.0)
MCH: 29.5 pg (ref 26.0–34.0)
MCHC: 29.7 g/dL — ABNORMAL LOW (ref 30.0–36.0)
MCV: 99.3 fL (ref 80.0–100.0)
Platelets: 55 10*3/uL — ABNORMAL LOW (ref 150–400)
RBC: 2.71 MIL/uL — ABNORMAL LOW (ref 4.22–5.81)
RDW: 15.7 % — ABNORMAL HIGH (ref 11.5–15.5)
WBC: 3 10*3/uL — ABNORMAL LOW (ref 4.0–10.5)
nRBC: 3.9 % — ABNORMAL HIGH (ref 0.0–0.2)

## 2018-12-11 LAB — FIBRINOGEN: Fibrinogen: 702 mg/dL — ABNORMAL HIGH (ref 210–475)

## 2018-12-11 LAB — PHOSPHORUS: Phosphorus: 5.2 mg/dL — ABNORMAL HIGH (ref 2.5–4.6)

## 2018-12-11 LAB — PROTIME-INR
INR: 1.3 — ABNORMAL HIGH (ref 0.8–1.2)
Prothrombin Time: 16.2 seconds — ABNORMAL HIGH (ref 11.4–15.2)

## 2018-12-11 MED ORDER — CIPROFLOXACIN IN D5W 200 MG/100ML IV SOLN
200.0000 mg | INTRAVENOUS | Status: DC
  Administered 2018-12-11: 200 mg via INTRAVENOUS
  Filled 2018-12-11: qty 100

## 2018-12-11 MED ORDER — CHLORHEXIDINE GLUCONATE 0.12 % MT SOLN
OROMUCOSAL | Status: AC
  Administered 2018-12-11: 08:00:00 15 mL via OROMUCOSAL
  Filled 2018-12-11: qty 15

## 2018-12-11 MED ORDER — CIPROFLOXACIN IN D5W 400 MG/200ML IV SOLN
400.0000 mg | Freq: Two times a day (BID) | INTRAVENOUS | Status: DC
  Administered 2018-12-11: 400 mg via INTRAVENOUS
  Filled 2018-12-11 (×4): qty 200

## 2018-12-11 NOTE — Progress Notes (Signed)
Pharmacy Electrolyte Monitoring Consult:  Pharmacy consulted to assist in monitoring and replacing electrolytes in this 58 y.o. male admitted on 10/28/2018. [name] currently intubated and sedated on fentanyl infusion and IV midazolam.  Labs:  Sodium (mmol/L)  Date Value  12/11/2018 149 (H)   Potassium (mmol/L)  Date Value  12/11/2018 3.6   Magnesium (mg/dL)  Date Value  12/11/2018 2.3   Phosphorus (mg/dL)  Date Value  12/11/2018 5.2 (H)   Calcium (mg/dL)  Date Value  12/11/2018 8.0 (L)   Albumin (g/dL)  Date Value  12/09/2018 1.7 (L)   Corrected Calcium: 10.1  Assessment/Plan: 1. Electrolytes: Sodium trending down from yesterday. Potassium improved to normal limits today. Per nephrology no indication for further dialysis at this time. Creatinine improving today with good urine output. Regular bowel movements.    --Free water flushes at 200 mL q4h    --D5/13mEq of Potassium at 53mL/hr started by nephrology.     --Will order BMP for tomorrow AM  2. Constipation: Currently having regular bowel movements.. Per nephrology avoid [place] enemas. Constipating medications include fentanyl infusion. [name] also diabetic. Bowel regimen discontinued due to regular bowel movements. Continue to monitor for further constipation or diarrhea.   3. Glucose: History of persistent hyperglycemia this admission. [name] now off of insulin drip and transitioned to [place] insulin. Diet includes continuous tube feeds as tolerated. Corticosteroids discontinued 9/11. Glucoses within goal range.    --Continue insulin glargine 40 units daily    --Continue 4 units insulin aspart q4h while on tube feeds    --Continue SSI q4h.   Pharmacy will continue to monitor and adjust per consult.   Benita Gutter  12/11/2018 12:43 PM

## 2018-12-11 NOTE — Progress Notes (Signed)
CRITICAL CARE PROGRESS NOTE    Name: Kyle White MRN: [id number] DOB: [birth year]     LOS: 57   SUBJECTIVE FINDINGS & SIGNIFICANT EVENTS   Patient description:  58 yo w/hx of morbid obesity, HFpEF hx MVR, CHF, PAF, OSA, DM, AHRF came in with acute hypoxemic hypercarbic resp failure with CXR showing b/l pl effusions and pulm edema.  CT with restrictive physiology,  pulm edema, bilateral pleural effusions and compressive atelectasis.  Events: 08/28 admitted to ICU/SDU for BiPAP. PCCM consultation 08/28 emergently intubated 08/29 Echocardiogram: EF 55-60%. LV cavity mildly dilated. Diastolic function could not be evaluated. Left atrium mildly dilated. RV not assessed. RA size not well visualized. 08/29 CTAP: No acute intra-abdominal process noted. No perforation. Trace ascites. Severe hepatic steatosis. Cholelithiasis. Small R and trace L pleural effusions. Bilateral lower lobe airspace disease may represent atelectasis but pneumonia is difficult to exclude, particularly on the R. 08/30 Extubated 08/30 Cardiology consultation: recommended diltiazem gtt for rapid AF and continue anticoagulation 08/31 reintubated emergently 08/31 bronchoscopy for mucous plugging and complete collapse of R lung 08/31 Palliative Care consultation to address goals of care 09/03 DNR established 09/05 Nephrology consultation for AKI 09/06 Renal DH:RCBULA appearance of the kidneys 09/06 requiring 100% FiO2 and PEEP 15 cm H2O 09/07 ETT cuff leak, ETT changed 09/08 CTH: No acute intracranial abnormality seen 09/10 HD cath placed and intermittent HD initiated 09/11 + F/C. Improving vent requirements. 2 hrs of HD performed 09/12 improving vent requirements. Low-dose clonazepam and Duragesic patch initiated. Goals of care  discussion with patient's sister, consent for tracheostomy tube. 09/13 Severe desaturation with increased purulent respiratory secretions and volume loss due to mucus plugging.Respiratory culture obtained. Antibiotics expanded to piperacillin-tazobactam. DNR confirmed with patient's sister. Continuous sedation infusions initiated 9/14 ETT dislodged, s/p bronch to adjust ETT, extubated and re-intubated Patient has tracheomegaly, plan for trach 9/15 remains on high fio2 9/16 s/p trach 9/17 did not tolerate dialysis due to hemodynamic instability.  Persistent shock physiology on pressors 9/18 re-spiked temperatures, culture sputum, Merrem restarted 9/21 - had family meeting. Currently sister of patient Kyle White making med decisions.  She understands overall poor prognosis and current critically ill state.  9/22-family met with PALS team-currently patient is DNR/DNI with plan to transition to comfort care measures this week  Lines / Drains: Midline   Cultures / Sepsis markers: Blood and resp cx  Antibiotics: none   Protocols / Consultants: Cardiology   Tests / Events: TTE   PAST MEDICAL HISTORY   Past Medical History:  Diagnosis Date  . Atrial fibrillation ([place])   . CHF (congestive heart failure) ([place])   . Diabetes mellitus without complication ([place])   . Gout   . Hypertension associated with type 2 diabetes mellitus ([place])   . Morbid obesity ([place])   . Obstructive sleep apnea      SURGICAL HISTORY   Past Surgical History:  Procedure Laterality Date  . MITRAL VALVE REPAIR    . TRACHEOSTOMY TUBE PLACEMENT N/A 11/27/2018   Procedure: TRACHEOSTOMY;  Surgeon: Margaretha Sheffield, MD;  Location: [facility] ORS;  Service: ENT;  Laterality: N/A;     FAMILY HISTORY   History reviewed. No pertinent family history.   SOCIAL HISTORY   Social History   Tobacco Use  . Smoking status: Never Smoker  . Smokeless tobacco: Never Used  Substance Use Topics  . Alcohol use: Never     Frequency: Never  . Drug use: Never     MEDICATIONS   Current Medication:  Current Facility-Administered Medications:  .  0.9 %  sodium chloride infusion, , Intravenous, PRN, Margaretha Sheffield, MD, Stopped at 12/09/18 [phone] .  acetaminophen (TYLENOL) tablet 650 mg, 650 mg, Per NG tube, Q4H PRN, Margaretha Sheffield, MD, 650 mg at 11/29/18 0255 .  amiodarone (NEXTERONE PREMIX) 360-4.14 MG/200ML-% (1.8 mg/mL) IV infusion, 60 mg/hr, Intravenous, Continuous, Margaretha Sheffield, MD, Last Rate: 33.3 mL/hr at 12/11/18 0728, 60 mg/hr at 12/11/18 0728 .  anticoagulant sodium citrate solution 5 mL, 5 mL, Intravenous, PRN, Darel Hong D, NP, 5 mL at 12/06/18 0115 .  bisacodyl (DULCOLAX) suppository 10 mg, 10 mg, Rectal, Daily PRN, Margaretha Sheffield, MD .  chlorhexidine gluconate (MEDLINE KIT) (PERIDEX) 0.12 % solution 15 mL, 15 mL, Mouth Rinse, BID, Margaretha Sheffield, MD, 15 mL at 12/10/18 2002 .  Chlorhexidine Gluconate Cloth 2 % PADS 6 each, 6 each, Topical, Daily, Margaretha Sheffield, MD, 6 each at 12/10/18 2148 .  clonazePAM (KLONOPIN) tablet 0.5 mg, 0.5 mg, Oral, BID, Fritzi Mandes, MD, 0.5 mg at 12/10/18 2142 .  dextrose 5 % with KCl 20 mEq / L  infusion, 20 mEq, Intravenous, Continuous, Lateef, Munsoor, MD, Last Rate: 75 mL/hr at 12/11/18 0636 .  feeding supplement (PRO-STAT SUGAR FREE 64) liquid 60 mL, 60 mL, Per Tube, TID, Margaretha Sheffield, MD, 60 mL at 12/10/18 2142 .  feeding supplement (VITAL HIGH PROTEIN) liquid 1,000 mL, 1,000 mL, Per Tube, Continuous, Margaretha Sheffield, MD, Last Rate: 45 mL/hr at 12/10/18 1627, 1,000 mL at 12/10/18 1627 .  fentaNYL (SUBLIMAZE) bolus via infusion 50 mcg, 50 mcg, Intravenous, Q15 min PRN, Margaretha Sheffield, MD .  fentaNYL 2540mg in NS 2554m(1011mml) infusion-PREMIX, 0-400 mcg/hr, Intravenous, Continuous, JueMargaretha SheffieldD, Last Rate: 15 mL/hr at 12/11/18 0636, 150 mcg/hr at 12/11/18 0636 .  free water 200 mL, 200 mL, Per Tube, Q4H, Tukov-Yual, Magdalene S, NP, 200 mL at 12/11/18 0352 .   insulin aspart (novoLOG) injection 0-20 Units, 0-20 Units, Subcutaneous, Q4H, JueMargaretha SheffieldD, 4 Units at 12/11/18 0357 .  insulin aspart (novoLOG) injection 4 Units, 4 Units, Subcutaneous, Q4H, ChaBenita GutterPH, 4 Units at 12/11/18 0357 .  insulin glargine (LANTUS) injection 40 Units, 40 Units, Subcutaneous, Daily, JueMargaretha SheffieldD, 40 Units at 12/10/18 [id number] ipratropium-albuterol (DUONEB) 0.5-2.5 (3) MG/3ML nebulizer solution 3 mL, 3 mL, Nebulization, Q6H, JueMargaretha SheffieldD, 3 mL at 12/11/18 0734 .  MEDLINE mouth rinse, 15 mL, Mouth Rinse, 10 times per day, JueMargaretha SheffieldD, 15 mL at 12/11/18 0352 .  meropenem (MERREM) 1 g in sodium chloride 0.9 % 100 mL IVPB, 1 g, Intravenous, Q8H, DunRowland LathePH, Stopped at 12/10/18 2340 .  metoprolol tartrate (LOPRESSOR) injection 2.5-5 mg, 2.5-5 mg, Intravenous, Q6H PRN, JueMargaretha SheffieldD, 5 mg at 12/10/18 1818 .  midazolam (VERSED) injection 2 mg, 2 mg, Intravenous, Q2H PRN, JueMargaretha SheffieldD, 2 mg at 12/02/18 2029 .  multivitamin liquid 15 mL, 15 mL, Per Tube, Daily, JueMargaretha SheffieldD, 15 mL at 12/10/18 0812 .  pantoprazole sodium (PROTONIX) 40 mg/20 mL oral suspension 40 mg, 40 mg, Per Tube, QHS, JueMargaretha SheffieldD, 40 mg at 12/10/18 2142 .  QUEtiapine (SEROQUEL) tablet 75 mg, 75 mg, Per Tube, QHS, SimCharlett NosePH, 75 mg at 12/10/18 2142 .  sodium chloride flush (NS) 0.9 % injection 10-40 mL, 10-40 mL, Intracatheter, Q12H, JueMargaretha SheffieldD, 10 mL at 12/10/18 2149 .  sodium chloride flush (NS) 0.9 % injection 10-40 mL, 10-40 mL, Intracatheter, PRN, Jueng[place],  Eddie Dibbles, [place]   Patient has no active allergies.    REVIEW OF SYSTEMS     Unable to obtain due to MV and sedation  PHYSICAL EXAMINATION   Vital Signs: Temp:  [97.9 F (36.6 C)-100 F (37.8 C)] 97.9 F (36.6 C) (09/22 0400) Pulse Rate:  [75-152] 75 (09/22 0600) Resp:  [20-24] 20 (09/22 0600) BP: (99-132)/(62-92) 120/73 (09/22 0600) SpO2:  [89 %-99 %]  97 % (09/22 0734) FiO2 (%):  [30 %-35 %] 35 % (09/22 0734) Weight:  [234 kg] 234 kg (09/22 0402)  GENERAL:GCS4T on MV RASS-1 HEAD: Normocephalic, atraumatic.  EYES: Pupils equal, round, reactive to light.  No scleral icterus.  MOUTH: Moist mucosal membrane. NECK: Supple. No thyromegaly. No nodules. No JVD.  PULMONARY bilateral crackles CARDIOVASCULAR: S1 and S2. Regular rate and rhythm. No murmurs, rubs, or gallops.  GASTROINTESTINAL: Soft, nontender, non-distended. No masses. Positive bowel sounds. No hepatosplenomegaly.  MUSCULOSKELETAL: No swelling, clubbing, or edema.  NEUROLOGIC: Mild distress due to acute illness SKIN:intact,warm,dry   PERTINENT DATA     Infusions: . sodium chloride Stopped (12/09/18 0343)  . amiodarone 60 mg/hr (12/11/18 0728)  . anticoagulant sodium citrate    . dextrose 5 % with KCl 20 mEq / L 75 mL/hr at 12/11/18 0636  . feeding supplement (VITAL HIGH PROTEIN) 1,000 mL (12/10/18 1627)  . fentaNYL infusion INTRAVENOUS 150 mcg/hr (12/11/18 0636)  . meropenem (MERREM) IV Stopped (12/10/18 2340)   Scheduled Medications: . chlorhexidine gluconate (MEDLINE KIT)  15 mL Mouth Rinse BID  . Chlorhexidine Gluconate Cloth  6 each Topical Daily  . clonazePAM  0.5 mg Oral BID  . feeding supplement (PRO-STAT SUGAR FREE 64)  60 mL Per Tube TID  . free water  200 mL Per Tube Q4H  . insulin aspart  0-20 Units Subcutaneous Q4H  . insulin aspart  4 Units Subcutaneous Q4H  . insulin glargine  40 Units Subcutaneous Daily  . ipratropium-albuterol  3 mL Nebulization Q6H  . mouth rinse  15 mL Mouth Rinse 10 times per day  . multivitamin  15 mL Per Tube Daily  . pantoprazole sodium  40 mg Per Tube QHS  . QUEtiapine  75 mg Per Tube QHS  . sodium chloride flush  10-40 mL Intracatheter Q12H   PRN Medications: sodium chloride, acetaminophen, anticoagulant sodium citrate, bisacodyl, fentaNYL, metoprolol tartrate, midazolam, sodium chloride flush Hemodynamic parameters:    Intake/Output: 09/21 0701 - 09/22 0700 In: 3769.9 [I.V.:2665.1; NG/GT:625; IV Piggyback:379.8] Out: 2850 [Urine:2750; Stool:100]  Ventilator  Settings: Vent Mode: PCV FiO2 (%):  [30 %-35 %] 35 % Set Rate:  [20 bmp] 20 bmp PEEP:  [6 cmH20] 6 cmH20   Other Labs:   LAB RESULTS:  Basic Metabolic Panel: Recent Labs  Lab 12/07/18 0505 12/08/18 0615 12/09/18 0446 12/10/18 0503 12/11/18 0401  NA 148* 148* 149* 151* 149*  K 3.5 3.5 3.0* 3.2* 3.6  CL 114* 114* 114* 117* 117*  CO2 23 23 24 26 24   GLUCOSE 182* 191* 181* 155* 199*  BUN 118* 136* 121* 115* 103*  CREATININE 2.55* 2.80* 2.49* 2.28* 1.99*  CALCIUM 7.9* 7.8* 8.2* 8.3* 8.0*  MG  --  2.5*  --  2.4 2.3  PHOS  --  5.9* 4.8*  --  5.2*   Liver Function Tests: Recent Labs  Lab 12/07/18 1505 12/09/18 0446  AST 61*  --   ALT 84*  --   ALKPHOS 382*  --   BILITOT 0.9  --  PROT 5.0*  --   ALBUMIN 1.5* 1.7*   No results for input(s): LIPASE, AMYLASE in the last 168 hours. No results for input(s): AMMONIA in the last 168 hours. CBC: Recent Labs  Lab 12/06/18 0503 12/07/18 0505 12/08/18 0615 12/09/18 0446 12/11/18 0401  WBC 3.9* 4.1 3.2* 3.3* 3.0*  NEUTROABS  --  3.3 2.6  --   --   HGB 9.3* 9.0* 8.2* 8.8* 8.0*  HCT 29.9* 28.5* 26.2* 28.4* 26.9*  MCV 95.8 95.0 96.0 95.0 99.3  PLT 54* 51* 45* 51* 55*   Cardiac Enzymes: No results for input(s): CKTOTAL, CKMB, CKMBINDEX, TROPONINI in the last 168 hours. BNP: Invalid input(s): POCBNP CBG: Recent Labs  Lab 12/10/18 1617 12/10/18 1943 12/10/18 2341 12/11/18 0341 12/11/18 0722  GLUCAP 144* 154* 184* 154* 189*     IMAGING RESULTS:  Imaging: No results found.    ASSESSMENT AND PLAN    -Multidisciplinary rounds held today  Acute Hypoxic Respiratory Failure - due to pneumonia with strep and staph species with moderate resistance per sensitivity panels with intercurrent acute decompensated CHF with OHS/OSA,COPD, atelctasis, pl effusion  -continue MV  support  -continue Bronchodilator Therapy -GOC meeting with sister yesteday , today again with PALS team   Acute decompensated diastolic CHF with EF >31% - hx of mitral valve repair - repeat TTE - patient has been positive on fluid now >30L.  Nephrology is managing fluid status.  We have attempted dialysis, patient did not tolerate due to recurrent clotting of catheter and hypotension.  -I&O strict via Foley -oxygen as needed -follow up cardiac enzymes as indicated ICU monitoring    Renal Failure-acute on chronic due to cardiorenal syndrome with diabetic nephropathy -with hypernatremia - appreciate nephrology - currently D5w and free water flushes via OGT.  [name] severely overloaded with fluid.  -follow chem 7 -follow UO -continue Foley Catheter-assess need daily    Electrolyte derrangements -pharmacy consultation for electrolytes -Na mgt by nehporolgy    ID -continue IV abx as prescibed -follow up cultures -added cipro this am due to worsening WBC count and ordering fibrinogent   GI/Nutrition GI PROPHYLAXIS as indicated DIET-->TF's as tolerated Constipation protocol as indicated  ENDO - ICU hypoglycemic\Hyperglycemia protocol -check FSBS per protocol   ELECTROLYTES -follow labs as needed -replace as needed -pharmacy consultation   DVT/GI PRX ordered -SCDs  TRANSFUSIONS AS NEEDED MONITOR FSBS ASSESS the need for LABS as needed   Critical care provider statement:    Critical care time (minutes):  35   Critical care time was exclusive of:  Separately billable procedures and treating other patients   Critical care was necessary to treat or prevent imminent or life-threatening deterioration of the following conditions:  acute hypoxemic respiratory failure, acute decompensated HFpEF, abd distension, dm, copd, osa, dm, multiple comorbid conditions   Critical care was time spent personally by me on the following activities:  Development of treatment plan  with patient or surrogate, discussions with consultants, evaluation of patient's response to treatment, examination of patient, obtaining history from patient or surrogate, ordering and performing treatments and interventions, ordering and review of laboratory studies and re-evaluation of patient's condition.  I assumed direction of critical care for this patient from another provider in my specialty: no    This document was prepared using Dragon voice recognition software and may include unintentional dictation errors.    Ottie Glazier, M.D.  Division of [place]

## 2018-12-11 NOTE — Consult Note (Signed)
Pharmacy Antibiotic Note  Kyle White is a 58 y.o. male admitted on 11/14/2018 with pneumonia. 9/3 BAL significant for Staph simulans and Strep mitis/oralis. Patient had received approximately 4 days of vancomycin therapy this admission (last dose 9/9). Meropenem was started on 9/9 for persistent fevers, concern for pseudomonas, and head CT significant for sinusitis. Meropenem and vancomycin were d/c 9/10 and ceftriaxone monotherapy was started. Ceftriaxone was then changed to Zosyn on 9/13 after concern for possible aspiration pneumonia. Per discussion on rounds plan is to discontinue Zosyn and re-start vancomycin as duration of therapy was determined to be inadequate. Pharmacy consulted for vancomycin dosing for Staph simulans and Strep mitis/oralis pneumonia. 9/14 CXR significant for stable bibasilar opacities with small pleural effusions. Patient also with acute renal failure secondary to acute cardiorenal syndrome receiving intermittent HD for uremia and fluid overload.    Patient remains intubated and recent tracheostomy 9/16. Patient with fevers on 9/18 and subsequently started on meropenem. Patient completed vancomycin therapy on 9/18. Dialysis access was removed on 9/21. Patient with good urine output. Per discussion on rounds will discontinue meropenem and start ciprofloxacin. Afebrile last 24h and leukopenia.   Plan:   Discontinue meropenem  Start IV ciprofloxacin 400 mg q12h   Height: 5' 9.02" (175.3 cm) Weight: (!) 515 lb 14 oz (234 kg) IBW/kg (Calculated) : 70.74  Temp (24hrs), Avg:98.9 F (37.2 C), Min:97.9 F (36.6 C), Max:100 F (37.8 C)  Recent Labs  Lab 12/04/18 1400  12/06/18 0503 12/07/18 0505 12/08/18 0615 12/09/18 0446 12/10/18 0503 12/11/18 0401  WBC  --    < > 3.9* 4.1 3.2* 3.3*  --  3.0*  CREATININE  --    < > 2.92* 2.55* 2.80* 2.49* 2.28* 1.99*  VANCORANDOM 19  --   --  14  --   --   --   --    < > = values in this interval not displayed.    Estimated  Creatinine Clearance: 76.9 mL/min (A) (by C-G formula based on SCr of 1.99 mg/dL (H)).    No Active Allergies  Antimicrobials this admission: Cefepime 9/3 >> 9/8 Vancomycin 9/7 >> 9/10, 9/14 >>9/18 Meropenem 9/9 >> 9/10; 9/18>> 9/22 Ceftriaxone 9/11 >> 9/12 Zosyn 9/13 >> 9/14 Ciprofloxacin 9/22 >>   Dose adjustments this admission:  9/9: Vancomycin decreased from 1750 mg Q24H to 1250 mg Q24H due to serum creatinine bump  9/10: Meropenem decreased from 1g Q12H to 500 mg Q24H due to patient starting intermittent HD. Vancomycin transitioned to Dialysis dosing.  9/19 Meropenem transitioned from Q12hr to Q8hr dosing.   Microbiology results: 9/18 Blood cultures: no growth x 4 days  9/18 Respiratory Culture: few candida albicans, rare GPC in clusters on gram stain 9/18 Urine culture: no growth  9/13 tracheal aspirate: few candida albicans 9/9 respiratory culture: rare GPC in pairs 9/9 respiratory panel: none detected 9/7 Bcx: no growth  9/3 BAL: Staph simulans & Strep mitis/oralis, vancomycin susceptible 8/29 Bcx: No growth 8/28 Ucx: No growth 8/28 MRSA PCR: negative  Thank you for allowing pharmacy to be a part of this patient's care.  Benita Gutter  12/11/2018 12:55 PM

## 2018-12-11 NOTE — Progress Notes (Signed)
[facility], [place] 12/11/18  Subjective:  Renal function continues to improve. Creatinine down to 1.99. Urine output 2.7 L over the preceding 24 hours.   Objective:  Vital signs in last 24 hours:  Temp:  [97.9 F (36.6 C)-100 F (37.8 C)] 98.6 F (37 C) (09/22 0800) Pulse Rate:  [63-152] 63 (09/22 0900) Resp:  [20-24] 20 (09/22 0900) BP: (99-132)/(67-92) 131/82 (09/22 0900) SpO2:  [90 %-99 %] 97 % (09/22 0900) FiO2 (%):  [30 %-35 %] 35 % (09/22 0900) Weight:  [234 kg] 234 kg (09/22 0402)  Weight change: 9 kg Filed Weights   12/08/18 0500 12/10/18 0452 12/11/18 0402  Weight: (!) 224 kg (!) 225 kg (!) 234 kg    Intake/Output:    Intake/Output Summary (Last 24 hours) at 12/11/2018 1057 Last data filed at 12/11/2018 2395 Gross per 24 hour  Intake 3743.48 ml  Output 2840 ml  Net 903.48 ml     Physical Exam: General: critically ill apearing  HEENT NGT in place, conjunctival edema  Pulm/lungs Vent assisted, Tracheostomy;  placed 12/18/2018  CVS/Heart Irregular  Abdomen:  Obese, BS present  Extremities: 2+ LE edema  Neurologic: Awake but not following commands  Skin: warm  Access: Rt femoral dialysis removed   Rectal tube, Foley in place    Basic Metabolic Panel:  Recent Labs  Lab 12/07/18 0505 12/08/18 0615 12/09/18 0446 12/10/18 0503 12/11/18 0401  NA 148* 148* 149* 151* 149*  K 3.5 3.5 3.0* 3.2* 3.6  CL 114* 114* 114* 117* 117*  CO2 _0 GLUCOSE 182* 191* 181* 155* 199*  BUN 118* 136* 121* 115* 103*  CREATININE 2.55* 2.80* 2.49* 2.28* 1.99*  CALCIUM 7.9* 7.8* 8.2* 8.3* 8.0*  MG  --  2.5*  --  2.4 2.3  PHOS  --  5.9* 4.8*  --  5.2*     CBC: Recent Labs  Lab 12/06/18 0503 12/07/18 0505 12/08/18 0615 12/09/18 0446 12/11/18 0401  WBC 3.9* 4.1 3.2* 3.3* 3.0*  NEUTROABS  --  3.3 2.6  --   --   HGB 9.3* 9.0* 8.2* 8.8* 8.0*  HCT 29.9* 28.5* 26.2* 28.4* 26.9*  MCV 95.8 95.0 96.0 95.0 99.3  PLT 54* 51* 45*  51* 55*      Lab Results  Component Value Date   HEPBSAG Negative 11/29/2018   HEPBSAB Non Reactive 11/29/2018   HEPBIGM Negative 11/29/2018      Microbiology:  Recent Results (from the past 240 hour(s))  Culture, respiratory (non-expectorated)     Status: None   Collection Time: 12/02/18  8:45 AM   Specimen: Tracheal Aspirate; Respiratory  Result Value Ref Range Status   Specimen Description   Final    TRACHEAL ASPIRATE Performed at [facility], [address] [place], [place] [address]    Special Requests   Final    NONE Performed at [facility], [address] [place], [place] [address]    Gram Stain   Final    NO WBC SEEN NO ORGANISMS SEEN Performed at [facility], [place] [address] [place], [place] [address]    Culture FEW CANDIDA ALBICANS  Final   Report Status 12/04/2018 FINAL  Final  Urine Culture     Status: None   Collection Time: 12/07/18  3:16 PM   Specimen: Urine, Random  Result Value Ref Range Status   Specimen Description   Final    URINE, RANDOM Performed at [facility], [address], [place], [place] [address]    Special Requests   Final    NONE Performed at [facility], [address], [place], [place] [address]    Culture   Final    NO GROWTH Performed at [facility], [place] [address], [place], [place] [address]    Report Status 12/08/2018 FINAL  Final  Culture, respiratory (non-expectorated)     Status: None   Collection Time: 12/07/18  3:24 PM   Specimen: Tracheal Aspirate; Respiratory  Result Value Ref Range Status   Specimen Description   Final    TRACHEAL ASPIRATE Performed at [facility], [address] [place], [place] [address]    Special Requests   Final    NONE Performed at [facility], [address] [place], [place] [address]    Gram Stain   Final    FEW WBC PRESENT, PREDOMINANTLY PMN RARE YEAST RARE GRAM POSITIVE COCCI IN  CLUSTERS Performed at [facility], [place] [address] [place], [place] [address]    Culture FEW CANDIDA ALBICANS  Final   Report Status 12/09/2018 FINAL  Final  CULTURE, BLOOD (ROUTINE X 2) w Reflex to ID Panel     Status: None (Preliminary result)   Collection Time: 12/07/18  3:27 PM   Specimen: BLOOD  Result Value Ref Range Status   Specimen Description BLOOD PORT  Final   Special Requests   Final    BOTTLES DRAWN AEROBIC AND ANAEROBIC Blood Culture adequate volume   Culture   Final    NO GROWTH 4 DAYS Performed at [facility], [address], [place], [place] [address]    Report Status PENDING  Incomplete  CULTURE, BLOOD (ROUTINE X 2) w Reflex to ID Panel     Status: None (Preliminary result)   Collection Time: 12/07/18  3:30 PM   Specimen: BLOOD  Result Value Ref Range Status   Specimen Description BLOOD RT HAND  Final   Special Requests   Final    BOTTLES DRAWN AEROBIC AND ANAEROBIC Blood Culture adequate volume   Culture   Final    NO GROWTH 4 DAYS Performed at [facility], [address] [place], [place] [address]    Report Status PENDING  Incomplete    Coagulation Studies: Recent Labs    12/11/18 0900  LABPROT 16.2*  INR 1.3*    Urinalysis: No results for input(s): COLORURINE, LABSPEC, PHURINE, GLUCOSEU, HGBUR, BILIRUBINUR, KETONESUR, PROTEINUR, UROBILINOGEN, NITRITE, LEUKOCYTESUR in the last 72 hours.  Invalid input(s): APPERANCEUR    Imaging: No results found.   Medications:   . sodium chloride Stopped (12/09/18 0343)  . amiodarone 60 mg/hr (12/11/18 0927)  . anticoagulant sodium citrate    . ciprofloxacin    . dextrose 5 % with KCl 20 mEq / L 75 mL/hr at 12/11/18 0927  . feeding supplement (VITAL HIGH PROTEIN) 1,000 mL (12/10/18 1627)  . fentaNYL infusion INTRAVENOUS 100 mcg/hr (12/11/18 0954)   . chlorhexidine gluconate (MEDLINE KIT)  15 mL Mouth Rinse BID  . Chlorhexidine Gluconate Cloth  6 each Topical Daily  .  clonazePAM  0.5 mg Oral BID  . feeding supplement (PRO-STAT SUGAR FREE 64)  60 mL Per Tube TID  . free water  200 mL Per Tube Q4H  . insulin aspart  0-20 Units Subcutaneous Q4H  . insulin aspart  4 Units Subcutaneous Q4H  . insulin glargine  40 Units Subcutaneous Daily  . ipratropium-albuterol  3 mL Nebulization Q6H  . mouth rinse  15 mL Mouth Rinse 10 times per day  . multivitamin  15 mL Per Tube Daily  . pantoprazole sodium  40 mg Per Tube QHS  . QUEtiapine  75 mg Per Tube QHS  . sodium chloride flush  10-40 mL Intracatheter Q12H   sodium chloride, acetaminophen, anticoagulant sodium citrate, bisacodyl, fentaNYL, metoprolol tartrate, midazolam, sodium chloride flush  Assessment/ Plan:  58 y.o. male with with atrial fibrillation on warfarin, congestive heart failure, diabetes mellitus type 2, obstructive sleep apnea,morbid obesity, gout, mitral valve replacementwho was admitted to [facility] on[facility]2020for acute exacerbation of COPD  Active Problems:   Acute exacerbation of CHF (congestive heart failure) ([place])   Acute respiratory failure with hypoxia and hypercapnia (H[facility])   Goals of care, counseling/discussion   Palliative care by specialist   DNR (do not resuscitate) discussion   COPD exacerbation ([place])   #. ARF on CKD st3 Recent Labs    12/08/18 0615 12/09/18 0446 12/10/18 0503 12/11/18 0401  CREATININE 2.80* 2.49* 2.28* 1.99*  Acute renal failure secondary to ATN from multiorgan failure -Renal parameters continue to improve.  Creatinine down to 1.99.  Good urine output noted.  No indication for any further dialysis.  # Hypokalemia,  Potassium up to 3.6 after repletion.  Continue to monitor.  # hypernatremia -Sodium down to 149.  Continue D5W at 75 cc/h.  #. Anemia of CKD  Lab Results  Component Value Date   HGB 8.0 (L) 12/11/2018  Continue to monitor hemoglobin.  Consider Epogen.  # Acute resp failure -Tracheostomy in place and patient breathing comfortably at  the moment.  # generalized Edema - 2 D echo from 8/29 shows LVEF 55-60% (nomral).  -Patient should begin to mobilize fluid now that his renal function has improved.  #. Diabetes type 2 with CKD Hgb A1c MFr Bld (%)  Date Value  11/17/2018 7.3 (H)   -Glycemic control as per hospitalist.    LOS: 25 Kelaiah Escalona 9/22/202010:57 AM  [facility], [place]

## 2018-12-11 NOTE — Progress Notes (Signed)
[facility] NAME: Westley Blass    MR#:  [id number]  DATE OF BIRTH:  Oct 07, 1960  SUBJECTIVE:  Pt is remains intubated on the ventilator, severely hypoxemic with underlying pickwickian syndrome,unsuccessful weaning trials.now has trach  Critically ill, uremic, started hemodialysis--issues with catheter clotting --now on hold making some urine underwent bronchoscopy 11/22/18.  Palliative care team is involved   patient now on IV  amiodarone, fentanyl Had fever--now on Meropenem and cipro  S/p trach 11/29/2018 S/p right femoral catheter  Sister in the room today. Had palliatvie care discussion in am REVIEW OF SYSTEMS:   Review of Systems  Unable to perform ROS: Intubated  Psychiatric/Behavioral: Nervous/anxious:      DRUG ALLERGIES:   No Active Allergies  VITALS:  Blood pressure 120/84, pulse (!) 117, temperature 98.2 F (36.8 C), temperature source Axillary, resp. rate 20, height 5' 9.02" (1.753 m), weight (!) 234 kg, SpO2 96 %.  PHYSICAL EXAMINATION:   Physical Exam  GENERAL:  58 y.o.-year-old patient lying in the bed with no acute distress. Morbidly obese  Critically ill ,on the vent EYES: Pupils equal, round, reactive to light and accommodation. No scleral icterus. Extraocular muscles intact.  HEENT: Head atraumatic, normocephalic. Oropharynx and nasopharynx clear. TRACH+ NECK:  Supple, no jugular venous distention. No thyroid enlargement, no tenderness.  LUNGS: Mod breath sounds bilaterally, positive wheezing, rales, rhonchi. CARDIOVASCULAR: S1, S2 normal. No murmurs, rubs, or gallops.  ABDOMEN: Soft, nontender, nondistended. Bowel sounds present. No organomegaly or mass.  EXTREMITIES: edema+NEUROLOGIC: awake reponds to painful stimuli  right groin HD cath SKIN: per RN documetnation  LABORATORY PANEL:  CBC Recent Labs  Lab 12/11/18 0401  WBC 3.0*  HGB 8.0*  HCT 26.9*  PLT 55*    Chemistries  Recent  Labs  Lab 12/07/18 1505  12/11/18 0401  NA  --    < > 149*  K  --    < > 3.6  CL  --    < > 117*  CO2  --    < > 24  GLUCOSE  --    < > 199*  BUN  --    < > 103*  CREATININE  --    < > 1.99*  CALCIUM  --    < > 8.0*  MG  --    < > 2.3  AST 61*  --   --   ALT 84*  --   --   ALKPHOS 382*  --   --   BILITOT 0.9  --   --    < > = values in this interval not displayed.   Cardiac Enzymes No results for input(s): TROPONINI in the last 168 hours. RADIOLOGY:  No results found. ASSESSMENT AND PLAN:  Cloyce Blankenhorn  is a 58 y.o. male with a known history of atrial fibrillation on Coumadin, CHF, diabetes mellitus, and obstructive sleep apnea with CPAP use at home, morbid obesity with sedentary lifestyle on oxygen at 2 L/min at home.  1.Acute on chronic  respiratory failure with severe hypoxia- underlying pickwickian syndrome -No clinical improvement, unsuccessful weaning trials--s/p tracheostomy 12/17/2018. Pt has shown no progress for > 3weeks -  -patient underwent bronchoscopy 11/22/2018. 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 HIT negative  #. Acute renal failure on chronic kidney disease stage III--NOW on HD  with baseline creatinine 1.28-1.89-1.87-2.25-2.21-3.05--3.17-3.11--2.5 -good UOP -continue to monitor renal function closely -Patient being uremic nephrology started the patient on hemodialysis--now on hold due to hemodynamic  instability -Has issues with Perm cath--has right groin temp cath +  #  Type IIDiabetes mellitus -Sliding scale insulin + lantus -Hemoglobin A1c 7.3  family met with palliative care and now they are leaning for comfort care per sister Overall poor prognosis. Appreciate palliative care input.   CODE STATUS: DNR  DVT Prophylaxis: scd as patient is thrombocytopenic  TOTAL TIME TAKING CARE OF THIS PATIENT:25 minutes.  >50% time spent on counselling and coordination of care  POSSIBLE D/C IN *?* DAYS, DEPENDING ON CLINICAL CONDITION.  Note: This dictation was prepared with Dragon dictation along with smaller phrase technology. Any transcriptional errors that result from this process are unintentional.   Fritzi Mandes M.D on 12/11/2018 at 2:43 PM  Between 7am to 6pm - Pager - [phone]  After 6pm go to [email or web address] - password [email or web address] [facility]  Office  [phone]  CC: Primary care physician; Inc, [facility] ServicesPatient ID: Tico Crotteau, male   DOB: 04/14/1960, 58 y.o.   MRN: [id number]

## 2018-12-11 NOTE — Progress Notes (Signed)
Daily Progress Note   Patient Name: Kyle White       Date: 12/11/2018 DOB: May 17, 1960  Age: 58 y.o. MRN#: [id number] Attending Physician: Fritzi Mandes, MD Primary Care Physician: [facility] [place] Date: 10/21/2018  Reason for Consultation/Follow-up: Establishing goals of care  Subjective: Patient opens eyes intermittently and will track. Does not follow commands. Remains intubated with trach on Fentanyl 185mg/hr.   GOC:  F/u GOC discussion with patient's sister (Kyle White, brother (Kyle White, and significant other of >25 years (Kyle White.   I have reviewed medical records extensively and discussed with RN and Kyle White   Introduced Palliative Medicine as specialized medical care for people living with serious illness. It focuses on providing relief from the symptoms and stress of a serious illness. The goal is to improve quality of life for both the patient and the family.  We discussed a brief life review of the patient. 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 Within the last few years, he has required 24/7 oxygen. He religiously wears is CPAP at night. Family describes him as a stubborn individual.  Discussed unfortunate series of events since admission including diagnoses, interventions, plan of care, and poor prognosis. Discussed multiorgan failure. Kyle Lippsand WGwyndolyn Saxonhave discussed with critical care providers and understand diagnoses and poor prognosis.   LAntawnnever completed a living will. JBland Span and BGallinaall agree that LLynell"would not want to live like this" and their suspicion that if he was more cognizant, he would pull out the trach immediately. They all agree that  LJovianwould not want to live in a nursing facility with poor quality of life and the inability to communicate with people.   The difference between aggressive medical intervention and comfort care was considered. Explained compassionate wean off of ventilator to comfort measures only in order to allow LWindsor Placecomfort, peace, and dignity at EOL. Educated on role of symptom management to ensure comfort and relief from suffering. Educated on EOL expectations and once ventilator is removed, prognosis of likely hours-days. Allowing nature to take course. Family expressed understanding of this.  Discussed timing of this when family is ready. BPamala Hurrydoes not wish to be here and watch LJontaedie.   Family does not yet make  a decision today regarding timing of compassionate wean. Family understands to notify nurse or palliative team phone if ready today. Reassured of follow-up tomorrow also.   Questions and concerns were addressed. Hard Choices and PMT contact information given. Emotional support provided.    Length of Stay: 25  Current Medications: Scheduled Meds:  . chlorhexidine gluconate (MEDLINE KIT)  15 mL Mouth Rinse BID  . Chlorhexidine Gluconate Cloth  6 each Topical Daily  . clonazePAM  0.5 mg Oral BID  . feeding supplement (PRO-STAT SUGAR FREE 64)  60 mL Per Tube TID  . free water  200 mL Per Tube Q4H  . insulin aspart  0-20 Units Subcutaneous Q4H  . insulin aspart  4 Units Subcutaneous Q4H  . insulin glargine  40 Units Subcutaneous Daily  . ipratropium-albuterol  3 mL Nebulization Q6H  . mouth rinse  15 mL Mouth Rinse 10 times per day  . multivitamin  15 mL Per Tube Daily  . pantoprazole sodium  40 mg Per Tube QHS  . QUEtiapine  75 mg Per Tube QHS  . sodium chloride flush  10-40 mL Intracatheter Q12H    Continuous Infusions: . sodium chloride Stopped (12/09/18 0343)  . amiodarone 60 mg/hr (12/11/18 0927)  . anticoagulant sodium citrate    . ciprofloxacin    . dextrose 5 %  with KCl 20 mEq / L 75 mL/hr at 12/11/18 0927  . feeding supplement (VITAL HIGH PROTEIN) 1,000 mL (12/10/18 1627)  . fentaNYL infusion INTRAVENOUS 100 mcg/hr (12/11/18 0954)    PRN Meds: sodium chloride, acetaminophen, anticoagulant sodium citrate, bisacodyl, fentaNYL, metoprolol tartrate, midazolam, sodium chloride flush  Physical Exam Vitals signs and nursing note reviewed.  Constitutional:      Appearance: He is ill-appearing.     Interventions: He is intubated.  HENT:     Head: Normocephalic and atraumatic.  Cardiovascular:     Rate and Rhythm: Rhythm irregularly irregular.     Comments: afib RVR on amio gtt Pulmonary:     Effort: No tachypnea, accessory muscle usage or respiratory distress. He is intubated.     Breath sounds: Wheezing and rhonchi present.     Comments: Trach/vent Abdominal:     Tenderness: There is no abdominal tenderness.  Skin:    General: Skin is warm and dry.     Comments: anasarca  Neurological:     Mental Status: He is easily aroused.     Comments: Opens eyes to voice. Tracks but otherwise does not follow commands or show meaningful engagement            Vital Signs: BP 131/82 (BP Location: Right Arm)   Pulse 63   Temp 98.6 F (37 C) (Axillary)   Resp 20   Ht 5' 9.02" (1.753 m)   Wt (!) 234 kg   SpO2 95%   BMI 76.15 kg/m  SpO2: SpO2: 95 % O2 Device: O2 Device: Ventilator O2 Flow Rate: O2 Flow Rate (L/min): 3 L/min  Intake/output summary:   Intake/Output Summary (Last 24 hours) at 12/11/2018 1155 Last data filed at 12/11/2018 8921 Gross per 24 hour  Intake 3743.48 ml  Output 2840 ml  Net 903.48 ml   LBM: Last BM Date: 12/10/18 Baseline Weight: Weight: (!) 158 kg Most recent weight: Weight: (!) 234 kg       Palliative Assessment/Data: PPS 30%   Flowsheet Rows     Most Recent Value  Intake Tab  Referral Department  Hospitalist  Unit at Time of Referral  ICU  Palliative Care Primary Diagnosis  Cardiac  Date Notified  11/15/2018   Palliative Care Type  New Palliative care  Reason for referral  Clarify Goals of Care  Date of Admission  10/29/2018  Date first seen by Palliative Care  11/19/18  # of days Palliative referral response time  3 Day(s)  # of days IP prior to Palliative referral  0  Clinical Assessment  Palliative Performance Scale Score  30%  Pain Max last 24 hours  Not able to report  Pain Min Last 24 hours  Not able to report  Dyspnea Max Last 24 Hours  Not able to report  Dyspnea Min Last 24 hours  Not able to report  Psychosocial & Spiritual Assessment  Palliative Care Outcomes      Patient Active Problem List   Diagnosis Date Noted  . COPD exacerbation ([place])   . Goals of care, counseling/discussion   . Palliative care by specialist   . DNR (do not resuscitate) discussion   . Acute exacerbation of CHF (congestive heart failure) ([place]) 11/06/2018  . Acute respiratory failure with hypoxia and hypercapnia [facility])     Palliative Care Assessment & Plan   Patient Profile: 58 y.o. male  with past medical history of morbid obesity, obstructive sleep apnea, A. fib, CHF, mitral valve repair, hypertension, type 2 diabetes, gout, admitted on 10/29/2018 with acute on chronic hypoxic respiratory failure requiring trach placement with inability to wean. Hospitalization complicated by ARDS, pneumonia, mucus plugging, acute kidney failure with underlying CKD not tolerating hemodialysis, CHF, afib RVR, and persistent shock. Poor prognosis.    Assessment: Acute on chronic hypoxic/hypercapnic respiratory failure Prolonged ventilator dependent respiratory failure s/p trach Sepsis from pneumonia  Acute on chronic renal failure Acute on chronic diastolic CHF Afib RVR Severe protein calorie malnutrition Fluid overload Thrombocytopenia Type 2 DM with persistent hyperglycemia Hx of OSA/OHS  Recommendations/Plan:  Extensive GOC discussion with patient's sister, brother, and girlfriend. Family consensus that Kyle White  would not wish to live like this. Family understands he has not made improvement despite 24 days of aggressive medical interventions. Family understands poor prognosis.   Family leaning towards transition to comfort measures in order to allow comfort, peace, and dignity at EOL. Process of compassionate extubation to comfort explained in detail to family. Family has not yet made decision on timing of extubation.   PMT will follow and continue to support family.    Code Status: DNR   Code Status Orders  (From admission, onward)         Start     Ordered   11/22/18 1642  Do not attempt resuscitation (DNR)  Continuous    Question Answer Comment  In the event of cardiac or respiratory ARREST Do not call a "code blue"   In the event of cardiac or respiratory ARREST Do not perform Intubation, CPR, defibrillation or ACLS   In the event of cardiac or respiratory ARREST Use medication by any route, position, wound care, and other measures to relive pain and suffering. May use oxygen, suction and manual treatment of airway obstruction as needed for comfort.      11/22/18 1641        Code Status History    Date Active Date Inactive Code Status Order ID Comments User Context   10/26/2018 1821 11/22/2018 1641 Full Code [id number]  Mayer Camel, NP ED   Advance Care Planning Activity       Prognosis:   Poor prognosis  Discharge Planning:  Anticipated Hospital Death once compassionately weaned from ventilator  Care plan was discussed with RN, Dr. Lanney White, sister, brother, girlfriend  Thank you for allowing the Palliative Medicine Team to assist in the care of this patient.   Time In: 1045 Time Out: 1200 Total Time 75 Prolonged Time Billed   yes      Greater than 50%  of this time was spent counseling and coordinating care related to the above assessment and plan.  Ihor Dow, DNP, FNP-C Palliative Medicine Team  Phone: [phone] Fax: [phone]  Please contact  Palliative Medicine Team phone at [phone] for questions and concerns.

## 2018-12-11 NOTE — Progress Notes (Signed)
Nutrition Follow-up  DOCUMENTATION CODES:   Morbid obesity  INTERVENTION:  Continue Vital High Protein at 45 mL/hr (1080 mL goal daily volume) + Pro-Stat 60 mL TID per tube. Provides 1680 kcal, 185 grams of protein, 907 mL H2O daily.  Continue liquid MVI daily per tube.  With free water flush of 200 mL Q4hrs patient is receiving a total of 2107 mL H2O daily including water in tube feeding.  NUTRITION DIAGNOSIS:   Inadequate oral intake related to inability to eat as evidenced by NPO status.  Ongoing - addressing with TF regimen.  GOAL:   Provide needs based on ASPEN/SCCM guidelines  Met with TF regimen.  MONITOR:   Vent status, Labs, Weight trends, TF tolerance, Skin, I & O's  REASON FOR ASSESSMENT:   Ventilator    ASSESSMENT:   58 year old male with PMHx of CHF, A-fib, DM, gout, OSA, HTN admitted with acute hypoxic hypercarbic respiratory failure, bilateral pulmonary effusions, pulmonary edema, compressive atelectasis, was intubated 8/28-8/30, then re-intubated on 31.   -Patient s/p tracheostomy tube placement on 9/16.  Patient remains intubated and sedated. On PCV mode with FiO2 35%, Pressure Control 25 cmH2O, PEEP 6 cmH2O. Abdomen taut per RN documentation. Patient having type 7 BMs per rectal tube. Plan was for family meeting today with PMT.  Enteral Access: NGT placed 9/16 (placed in OR); terminates in stomach per chest x-ray 9/18; 80 cm at right nare  MAP: 78-99 mmHg  TF: VHP at 45 mL/hr + Pro-Stat 60 mL TID + FWF 200 mL Q4hrs  [name] currently intubated on ventilator support Ve: 13.3 L/min Temp (24hrs), Avg:98.9 F (37.2 C), Min:97.9 F (36.6 C), Max:100 F (37.8 C)  Propofol: N/A  Medications reviewed and include: clonazepam, Novolog 0-20 units Q4hrs, Novolog 4 units Q4hrs, Lantus 40 units daily, liquid MVI daily, pantoprazole, Seroquel, amiodarone gtt, ciprofloxacin, D5W with KCl 20 mEq/L at 75 mL/hr, fentanyl gtt.  Labs reviewed: CBG 189-220,  Sodium 149, chloride 117, BUN 103, Creatinine 1.99, Phosphorus 5.2.  I/O: 2750 ml UOP yesterday (0.5 mL/kg/hr)  Weight trend: 234 kg on 9/22; +61 kg from 8/28  Discussed with RN and on rounds.  Diet Order:   Diet Order            Diet NPO time specified  Diet effective now             EDUCATION NEEDS:   No education needs have been identified at this time  Skin:  Skin Assessment: Reviewed RN Assessment  Last BM:  12/11/2018 - type 7 per rectal tube  Height:   Ht Readings from Last 1 Encounters:  11/21/18 5' 9.02" (1.753 m)   Weight:   Wt Readings from Last 1 Encounters:  12/11/18 (!) 234 kg   Ideal Body Weight:  72.8 kg  BMI:  Body mass index is 76.15 kg/m.  Estimated Nutritional Needs:   Kcal:  1602-1820 (22-25 kcal/kg IBW)  Protein:  182 grams (2.5 grams/kg IBW)  Fluid:  2-2.2 L/day  Willey Blade, MS, RD, LDN Office: [phone] Pager: [phone] After Hours/Weekend Pager: [phone]

## 2018-12-11 NOTE — TOC Progression Note (Signed)
Transition of Care [facility]) - Progression Note    Patient Details  Name: Kyle White MRN: [id number] Date of Birth: 07-19-1960  Transition of Care [facility]) CM/SW [place], [place] Phone Number: 12/11/2018, 4:19 PM  Clinical Narrative:   Family met with Palliative care today regarding plan for patient. Per Palliative note, family has decided to do a comfort wean off ventilation and pursue comfort measures. Family has not decided when to start this wean at this time. CSW will monitor for any additional needs.     Expected Discharge Plan: Long Term Acute Care (LTAC) Barriers to Discharge: Continued Medical Work up  Expected Discharge Plan and Services Expected Discharge Plan: [place] (LTAC)                                               Social Determinants of Health (SDOH) Interventions    Readmission Risk Interventions Readmission Risk Prevention Plan 12/04/2018  Transportation Screening Complete  PCP or Specialist Appt within 3-5 Days Complete  HRI or [place] Complete  Social Work Consult for [place] Planning/Counseling Complete  Palliative Care Screening Patient Refused  Medication Review Press photographer) Referral to Pharmacy  Some recent data might be hidden

## 2018-12-12 LAB — CULTURE, BLOOD (ROUTINE X 2)
Culture: NO GROWTH
Culture: NO GROWTH
Special Requests: ADEQUATE
Special Requests: ADEQUATE

## 2018-12-12 LAB — GLUCOSE, CAPILLARY
Glucose-Capillary: 158 mg/dL — ABNORMAL HIGH (ref 70–99)
Glucose-Capillary: 173 mg/dL — ABNORMAL HIGH (ref 70–99)
Glucose-Capillary: 180 mg/dL — ABNORMAL HIGH (ref 70–99)
Glucose-Capillary: 215 mg/dL — ABNORMAL HIGH (ref 70–99)

## 2018-12-12 LAB — BASIC METABOLIC PANEL
Anion gap: 9 (ref 5–15)
BUN: 97 mg/dL — ABNORMAL HIGH (ref 6–20)
CO2: 26 mmol/L (ref 22–32)
Calcium: 8.6 mg/dL — ABNORMAL LOW (ref 8.9–10.3)
Chloride: 111 mmol/L (ref 98–111)
Creatinine, Ser: 2.01 mg/dL — ABNORMAL HIGH (ref 0.61–1.24)
GFR calc Af Amer: 41 mL/min — ABNORMAL LOW (ref >60)
GFR calc non Af Amer: 35 mL/min — ABNORMAL LOW (ref >60)
Glucose, Bld: 194 mg/dL — ABNORMAL HIGH (ref 70–99)
Potassium: 4.1 mmol/L (ref 3.5–5.1)
Sodium: 146 mmol/L — ABNORMAL HIGH (ref 135–145)

## 2018-12-12 MED ORDER — MORPHINE 100MG IN NS 100ML (1MG/ML) PREMIX INFUSION
1.0000 mg/h | INTRAVENOUS | Status: DC
  Administered 2018-12-12: 13:00:00 2 mg/h via INTRAVENOUS
  Filled 2018-12-12: qty 100

## 2018-12-12 MED ORDER — DEXTROSE 5 % IV SOLN: INTRAVENOUS | Status: DC

## 2018-12-12 MED ORDER — MIDAZOLAM HCL 2 MG/2ML IJ SOLN
1.0000 mg | INTRAMUSCULAR | Status: DC | PRN
  Administered 2018-12-12 (×2): 2 mg via INTRAVENOUS
  Filled 2018-12-12 (×2): qty 2

## 2018-12-12 MED ORDER — MORPHINE BOLUS VIA INFUSION
2.0000 mg | INTRAVENOUS | Status: DC | PRN
  Filled 2018-12-12: qty 4

## 2018-12-12 MED ORDER — GLYCOPYRROLATE 0.2 MG/ML IJ SOLN
0.2000 mg | INTRAMUSCULAR | Status: DC | PRN
  Administered 2018-12-12: 13:00:00 0.2 mg via INTRAVENOUS
  Filled 2018-12-12: qty 1

## 2018-12-20 NOTE — Plan of Care (Signed)
[name] taken off from vent and placed on room air

## 2018-12-20 NOTE — Progress Notes (Signed)
Symptom check on patient post liberation from ventilator. Patient appears comfortable on morphine gtt. Unresponsive with agonal respirations. No facial grimacing or signs of discomfort. Sister and brother at bedside. Patient passed peacefully during my visit. Patient pronounced with bedside RN. Emotional support provided to family.   NO CHARGE  Ihor Dow, [place], FNP-C Palliative Medicine Team  Phone: [phone] Fax: [phone]

## 2018-12-20 NOTE — Progress Notes (Signed)
Comfort care started per Jinny Blossom, NP- Morphine drip infusing at this time and sister [name] at bedside.  [name] requested to wait for extubating patient until patient's brother arrives.  Patient seems comfortable at this time.

## 2018-12-20 NOTE — Death Summary Note (Signed)
DEATH SUMMARY     Name: Kyle White MRN: [id number] DOB: Jul 03, 1960     LOS: 76   DEATH SUMMARY    Patient description:  58 yo w/hx of morbid obesity, HFpEF hx MVR, CHF, PAF, OSA, DM, AHRF came in with acute hypoxemic hypercarbic resp failure with CXR showing b/l pl effusions and pulm edema.  CT with restrictive physiology,  pulm edema, bilateral pleural effusions and compressive atelectasis.  Events: 08/28 admitted to ICU/SDU for BiPAP. PCCM consultation 08/28 emergently intubated 08/29 Echocardiogram: EF 55-60%. LV cavity mildly dilated. Diastolic function could not be evaluated. Left atrium mildly dilated. RV not assessed. RA size not well visualized. 08/29 CTAP: No acute intra-abdominal process noted. No perforation. Trace ascites. Severe hepatic steatosis. Cholelithiasis. Small R and trace L pleural effusions. Bilateral lower lobe airspace disease may represent atelectasis but pneumonia is difficult to exclude, particularly on the R. 08/30 Extubated 08/30 Cardiology consultation: recommended diltiazem gtt for rapid AF and continue anticoagulation 08/31 reintubated emergently 08/31 bronchoscopy for mucous plugging and complete collapse of R lung 08/31 Palliative Care consultation to address goals of care 09/03 DNR established 09/05 Nephrology consultation for AKI 09/06 Renal VA:EPNTBH appearance of the kidneys 09/06 requiring 100% FiO2 and PEEP 15 cm H2O 09/07 ETT cuff leak, ETT changed 09/08 CTH: No acute intracranial abnormality seen 09/10 HD cath placed and intermittent HD initiated 09/11 + F/C. Improving vent requirements. 2 hrs of HD performed 09/12 improving vent requirements. Low-dose clonazepam and Duragesic patch initiated. Goals of care discussion with patient's sister, consent for  tracheostomy tube. 09/13 Severe desaturation with increased purulent respiratory secretions and volume loss due to mucus plugging.Respiratory culture obtained. Antibiotics expanded to piperacillin-tazobactam. DNR confirmed with patient's sister. Continuous sedation infusions initiated 9/14 ETT dislodged, s/p bronch to adjust ETT, extubated and re-intubated Patient has tracheomegaly, plan for trach 9/15 remains on high fio2 9/16 s/p trach 9/17 did not tolerate dialysis due to hemodynamic instability.  Persistent shock physiology on pressors 9/18 re-spiked temperatures, culture sputum, Merrem restarted 9/21 - had family meeting. Currently sister of patient [name] making med decisions.  She understands overall poor prognosis and current critically ill state.  9/22-family met with PALS team-currently patient is DNR/DNI with plan to transition to comfort care measures this week  [name] s/p comfort weaning with family at bedside.  He passed at 1523. May he rest in peace.        This document was prepared using Dragon voice recognition software and may include unintentional dictation errors.    Ottie Glazier, M.D.  Division of [place]

## 2018-12-20 NOTE — Progress Notes (Signed)
[place], [place] 12/19/18  Subjective:  Remains critically ill. Creatinine currently 2.01. Good urine output noted. Still on the ventilator. Serum sodium down to 146.   Objective:  Vital signs in last 24 hours:  Temp:  [98.2 F (36.8 C)-100.4 F (38 C)] 98.2 F (36.8 C) (09/23 0821) Pulse Rate:  [57-160] 129 (09/23 0821) Resp:  [18-24] 21 (09/23 0821) BP: (93-136)/(66-89) 99/75 (09/23 0800) SpO2:  [89 %-100 %] 94 % (09/23 0821) FiO2 (%):  [35 %] 35 % (09/23 0740) Weight:  [234 kg] 234 kg (09/23 0500)  Weight change: 0 kg Filed Weights   12/10/18 0452 12/11/18 0402 December 19, 2018 0500  Weight: (!) 225 kg (!) 234 kg (!) 234 kg    Intake/Output:    Intake/Output Summary (Last 24 hours) at Dec 19, 2018 1001 Last data filed at 12-19-18 0600 Gross per 24 hour  Intake 3067.24 ml  Output 2210 ml  Net 857.24 ml     Physical Exam: General: critically ill apearing  HEENT NGT in place, conjunctival edema  Pulm/lungs bilateral rhonchi, vent assisted  CVS/Heart Irregular  Abdomen:  Obese, BS present  Extremities: 2+ LE edema  Neurologic: Awake but not following commands  Skin: warm  Access: Rt femoral dialysis removed   Rectal tube, Foley in place    Basic Metabolic Panel:  Recent Labs  Lab 12/08/18 0615 12/09/18 0446 12/10/18 0503 12/11/18 0401 12-19-2018 0422  NA 148* 149* 151* 149* 146*  K 3.5 3.0* 3.2* 3.6 4.1  CL 114* 114* 117* 117* 111  CO2 23 24 26 24 26   GLUCOSE 191* 181* 155* 199* 194*  BUN 136* 121* 115* 103* 97*  CREATININE 2.80* 2.49* 2.28* 1.99* 2.01*  CALCIUM 7.8* 8.2* 8.3* 8.0* 8.6*  MG 2.5*  --  2.4 2.3  --   PHOS 5.9* 4.8*  --  5.2*  --      CBC: Recent Labs  Lab 12/06/18 0503 12/07/18 0505 12/08/18 0615 12/09/18 0446 12/11/18 0401  WBC 3.9* 4.1 3.2* 3.3* 3.0*  NEUTROABS  --  3.3 2.6  --   --   HGB 9.3* 9.0* 8.2* 8.8* 8.0*  HCT 29.9* 28.5* 26.2* 28.4* 26.9*  MCV 95.8 95.0 96.0 95.0 99.3  PLT 54* 51* 45*  51* 55*      Lab Results  Component Value Date   HEPBSAG Negative 11/29/2018   HEPBSAB Non Reactive 11/29/2018   HEPBIGM Negative 11/29/2018      Microbiology:  Recent Results (from the past 240 hour(s))  Urine Culture     Status: None   Collection Time: 12/07/18  3:16 PM   Specimen: Urine, Random  Result Value Ref Range Status   Specimen Description   Final    URINE, RANDOM Performed at [facility], [address], [place], [place] [address]    Special Requests   Final    NONE Performed at [facility], [address], [place], [place] [address]    Culture   Final    NO GROWTH Performed at [facility], [place] [address] [place], [place] [address]    Report Status 12/08/2018 FINAL  Final  Culture, respiratory (non-expectorated)     Status: None   Collection Time: 12/07/18  3:24 PM   Specimen: Tracheal Aspirate; Respiratory  Result Value Ref Range Status   Specimen Description   Final    TRACHEAL ASPIRATE Performed at [facility], [address], [place], [place] [address]    Special Requests  Final    NONE Performed at [facility], [place], [place] [address]    Gram Stain   Final    FEW WBC PRESENT, PREDOMINANTLY PMN RARE YEAST RARE GRAM POSITIVE COCCI IN CLUSTERS Performed at [facility], [place] [address] [place], [place] [address]    Culture FEW CANDIDA ALBICANS  Final   Report Status 12/09/2018 FINAL  Final  CULTURE, BLOOD (ROUTINE X 2) w Reflex to ID Panel     Status: None   Collection Time: 12/07/18  3:27 PM   Specimen: BLOOD  Result Value Ref Range Status   Specimen Description BLOOD PORT  Final   Special Requests   Final    BOTTLES DRAWN AEROBIC AND ANAEROBIC Blood Culture adequate volume   Culture   Final    NO GROWTH 5 DAYS Performed at [facility], [address] [place], [place] [address]    Report Status 01-07-19 FINAL  Final  CULTURE, BLOOD (ROUTINE X 2) w  Reflex to ID Panel     Status: None   Collection Time: 12/07/18  3:30 PM   Specimen: BLOOD  Result Value Ref Range Status   Specimen Description BLOOD RT HAND  Final   Special Requests   Final    BOTTLES DRAWN AEROBIC AND ANAEROBIC Blood Culture adequate volume   Culture   Final    NO GROWTH 5 DAYS Performed at [facility], [address] [place], [place] [address]    Report Status 01-07-19 FINAL  Final    Coagulation Studies: Recent Labs    12/11/18 0900  LABPROT 16.2*  INR 1.3*    Urinalysis: No results for input(s): COLORURINE, LABSPEC, PHURINE, GLUCOSEU, HGBUR, BILIRUBINUR, KETONESUR, PROTEINUR, UROBILINOGEN, NITRITE, LEUKOCYTESUR in the last 72 hours.  Invalid input(s): APPERANCEUR    Imaging: No results found.   Medications:   . sodium chloride Stopped (12/09/18 0343)  . amiodarone 60 mg/hr (01/07/2019 0826)  . anticoagulant sodium citrate    . ciprofloxacin Stopped (12/11/18 2355)  . dextrose 5 % with KCl 20 mEq / L 75 mL/hr at 01/07/19 0600  . feeding supplement (VITAL HIGH PROTEIN) 1,000 mL (12/11/18 1701)  . fentaNYL infusion INTRAVENOUS 150 mcg/hr (January 07, 2019 0600)   . chlorhexidine gluconate (MEDLINE KIT)  15 mL Mouth Rinse BID  . Chlorhexidine Gluconate Cloth  6 each Topical Daily  . clonazePAM  0.5 mg Oral BID  . feeding supplement (PRO-STAT SUGAR FREE 64)  60 mL Per Tube TID  . free water  200 mL Per Tube Q4H  . insulin aspart  0-20 Units Subcutaneous Q4H  . insulin aspart  4 Units Subcutaneous Q4H  . insulin glargine  40 Units Subcutaneous Daily  . ipratropium-albuterol  3 mL Nebulization Q6H  . mouth rinse  15 mL Mouth Rinse 10 times per day  . multivitamin  15 mL Per Tube Daily  . pantoprazole sodium  40 mg Per Tube QHS  . QUEtiapine  75 mg Per Tube QHS  . sodium chloride flush  10-40 mL Intracatheter Q12H   sodium chloride, acetaminophen, anticoagulant sodium citrate, bisacodyl, fentaNYL, metoprolol tartrate, midazolam, sodium  chloride flush  Assessment/ Plan:  58 y.o. male with with atrial fibrillation on warfarin, congestive heart failure, diabetes mellitus type 2, obstructive sleep apnea,morbid obesity, gout, mitral valve replacementwho was [facility]for acute exacerbation of COPD  Active Problems:   Acute exacerbation of CHF (congestive heart failure) ([place])   Acute respiratory failure with hypoxia  and hypercapnia ([place])   Goals of care, counseling/discussion   Palliative care by specialist   DNR (do not resuscitate) discussion   COPD exacerbation ([place])   Acute on chronic respiratory failure with hypoxia and hypercapnia ([place])   Multiple tracheobronchial mucus plugs   Acute renal failure ([place])   #. ARF on CKD st3 Recent Labs    12/09/18 0446 12/10/18 0503 12/11/18 0401 12/13/18 0422  CREATININE 2.49* 2.28* 1.99* 2.01*  Acute renal failure secondary to ATN from multiorgan failure -Renal parameters have improved over the course of the hospitalization.  BUN currently 97 with a creatinine of 2.01.  No further need for dialysis.  # Hypokalemia,  Potassium corrected and currently 4.1.  # hypernatremia -Sodium now down to 146 with D5W.  #. Anemia of CKD  Lab Results  Component Value Date   HGB 8.0 (L) 12/11/2018  Most recent hemoglobin was 8.0.  Hold off on Epogen at this time.  # Acute resp failure -Has been difficult to wean off of the ventilator.  Family considering comfort care.  # generalized Edema - 2 D echo from 8/29 shows LVEF 55-60% (nomral).  -Patient should begin to mobilize fluid now that his renal function has improved.  #. Diabetes type 2 with CKD Hgb A1c MFr Bld (%)  Date Value  11/17/2018 7.3 (H)  -Glycemic control as per hospitalist.    LOS: 26 Kyle White 2020/11/2408:01 [place], [place]

## 2018-12-20 NOTE — Progress Notes (Signed)
Daily Progress Note   Patient Name: Kyle White       Date: 01-07-2019 DOB: 12-21-1960  Age: 58 y.o. MRN#: [id number] Attending Physician: Max Sane, MD Primary Care Physician: [facility], [place] Date: 10/25/2018   Reason for Consultation/Follow-up: Establishing goals of care  Subjective: Patient intermittently will open eyes and track, but does not follow commands. No meaningful interaction with staff or family at bedside. Appears comfortable.  GOC:   F/u with sister, [name] at bedside.   [name] confirms family decision to withdrawal care to focus on comfort, confirming our conversation yesterday that [place] would not wish to live like this. [name] is ready to withdrawal care this afternoon. Brother and girlfriend do not plan to come back to bedside. Pastor visited bedside last night. [name] declines chaplain during extubation.   Explained process of compassionate extubation to comfort measures only, in order to allow nature to take course and for [place] to have peace and dignity at EOL. Explained symptom management medications to ensure comfort and relief from suffering. Educated on EOL expectations. [name] understands intervention not aimed at comfort will be discontinued. Discussed comfort screen. Prepared [name] for prognosis of minutes-hours-days once ventilator is removed.   Answered all questions and concerns for [place]. Emotional support provided.    Length of Stay: 26  Current Medications: Scheduled Meds:  . chlorhexidine gluconate (MEDLINE KIT)  15 mL Mouth Rinse BID  . clonazePAM  0.5 mg Oral BID  . ipratropium-albuterol  3 mL Nebulization Q6H  . mouth rinse  15 mL Mouth Rinse 10 times per day  . QUEtiapine  75 mg Per Tube QHS  . sodium chloride flush  10-40 mL  Intracatheter Q12H    Continuous Infusions: . sodium chloride Stopped (12/09/18 0343)  . fentaNYL infusion INTRAVENOUS 150 mcg/hr (07-Jan-2019 0600)  . morphine      PRN Meds: sodium chloride, acetaminophen, bisacodyl, fentaNYL, glycopyrrolate, metoprolol tartrate, midazolam, morphine, sodium chloride flush  Physical Exam Vitals signs and nursing note reviewed.  Constitutional:      Appearance: He is ill-appearing.     Interventions: He is intubated.  HENT:     Head: Normocephalic and atraumatic.  Cardiovascular:     Rate and Rhythm: Rhythm irregularly irregular.     Comments: afib RVR on amio gtt Pulmonary:  Effort: No tachypnea, accessory muscle usage or respiratory distress. He is intubated.     Breath sounds: Wheezing and rhonchi present.     Comments: Trach/vent Abdominal:     Tenderness: There is no abdominal tenderness.  Skin:    General: Skin is warm and dry.     Comments: anasarca  Neurological:     Mental Status: He is easily aroused.     Comments: Opens eyes to voice. Tracks but otherwise does not follow commands or show meaningful engagement            Vital Signs: BP 111/70   Pulse 96   Temp 98.2 F (36.8 C) (Axillary)   Resp (!) 24   Ht 5' 9.02" (1.753 m)   Wt (!) 234 kg   SpO2 93%   BMI 76.15 kg/m  SpO2: SpO2: 93 % O2 Device: O2 Device: Ventilator O2 Flow Rate: O2 Flow Rate (L/min): 3 L/min  Intake/output summary:   Intake/Output Summary (Last 24 hours) at 2018/12/24 1234 Last data filed at 12-24-18 6295 Gross per 24 hour  Intake 2892.91 ml  Output 1860 ml  Net 1032.91 ml   LBM: Last BM Date: Dec 24, 2018 Baseline Weight: Weight: (!) 158 kg Most recent weight: Weight: (!) 234 kg       Palliative Assessment/Data: PPS 10%   Flowsheet Rows     Most Recent Value  Intake Tab  Referral Department  Critical care  Unit at Time of Referral  ICU  Palliative Care Primary Diagnosis  Other (Comment) [multiorgan failure]  Date Notified  11/09/2018   Palliative Care Type  New Palliative care  Reason for referral  Clarify Goals of Care  Date of Admission  11/05/2018  Date first seen by Palliative Care  11/19/18  # of days Palliative referral response time  3 Day(s)  # of days IP prior to Palliative referral  0  Clinical Assessment  Palliative Performance Scale Score  10%  Pain Max last 24 hours  Not able to report  Pain Min Last 24 hours  Not able to report  Dyspnea Max Last 24 Hours  Not able to report  Dyspnea Min Last 24 hours  Not able to report  Psychosocial & Spiritual Assessment  Palliative Care Outcomes  Patient/Family meeting held?  Yes  Who was at the meeting?  sister, brother, gf  Palliative Care Outcomes  Improved pain interventions, Improved non-pain symptom therapy, Clarified goals of care, Provided end of life care assistance, Provided psychosocial or spiritual support, Changed to focus on comfort      Patient Active Problem List   Diagnosis Date Noted  . Acute on chronic respiratory failure with hypoxia and hypercapnia (HCC)   . Multiple tracheobronchial mucus plugs   . Acute renal failure ([place])   . COPD exacerbation ([place])   . Goals of care, counseling/discussion   . Palliative care by specialist   . DNR (do not resuscitate) discussion   . Acute exacerbation of CHF (congestive heart failure) ([place]) 10/30/2018  . Acute respiratory failure with hypoxia and hypercapnia [facility])     Palliative Care Assessment & Plan   Patient Profile: 58 y.o. male  with past medical history of morbid obesity, obstructive sleep apnea, A. fib, CHF, mitral valve repair, hypertension, type 2 diabetes, gout, admitted on 10/29/2018 with acute on chronic hypoxic respiratory failure requiring trach placement with inability to wean. Hospitalization complicated by ARDS, pneumonia, mucus plugging, acute kidney failure with underlying CKD not tolerating hemodialysis, CHF, afib RVR, and  persistent shock. Poor prognosis.    Assessment: Acute on  chronic hypoxic/hypercapnic respiratory failure Prolonged ventilator dependent respiratory failure s/p trach Sepsis from pneumonia  Acute on chronic renal failure Acute on chronic diastolic CHF Afib RVR Severe protein calorie malnutrition Fluid overload Thrombocytopenia Type 2 DM with persistent hyperglycemia Hx of OSA/OHS  Recommendations/Plan:  Extensive GOC discussion with patient's sister, brother, and girlfriend on 12/11/18. Family consensus that Theodor would not wish to live like this. Family understands he has not made improvement despite 26 days of aggressive medical interventions. Family understands poor prognosis.   Family ready for transition to comfort measures only. Only sister wishes to be at bedside this afternoon. Explained process of compassionate extubation to comfort including discontinuation of interventions not aimed at comfort. Prepared her for prognosis of minutes-hours-days once ventilator is removed.  Symptom management  RN to initiate morphine infusion  RN may bolus morphine via infusion 2-54m q163m prn pain/dyspnea/tachypnea/air hunger  Versed 1-22m30mV q1h prn anxiety/agiation  Robinul 0.22mg50m q4h prn secretions  Anticipate hospital death  Sister declined chaplain visit during extubation.  Code Status: DNR   Code Status Orders  (From admission, onward)         Start     Ordered   11/22/18 1642  Do not attempt resuscitation (DNR)  Continuous    Question Answer Comment  In the event of cardiac or respiratory ARREST Do not call a "code blue"   In the event of cardiac or respiratory ARREST Do not perform Intubation, CPR, defibrillation or ACLS   In the event of cardiac or respiratory ARREST Use medication by any route, position, wound care, and other measures to relive pain and suffering. May use oxygen, suction and manual treatment of airway obstruction as needed for comfort.      11/22/18 1641        Code Status History    Date Active  Date Inactive Code Status Order ID Comments User Context   11/07/2018 1821 11/22/2018 1641 Full Code [id number]   Advance Care Planning Activity       Prognosis:   Poor prognosis likely minutes-hours-days once ventilator is discontinued  Discharge Planning:  Anticipated Hospital Death once compassionately weaned from ventilator  Care plan was discussed with RN, Dr. AlesLanney Ginsster  Thank you for allowing the Palliative Medicine Team to assist in the care of this patient.   Time In: 1200 Time Out: 1240 Total Time 40 Prolonged Time Billed  no      Greater than 50%  of this time was spent counseling and coordinating care related to the above assessment and plan.  MegaIhor DowP, FNP-C Palliative Medicine Team  Phone: [phone]: [phone] contact Palliative Medicine Team phone at [phone] questions and concerns.

## 2018-12-20 NOTE — Progress Notes (Signed)
CRITICAL CARE PROGRESS NOTE    Name: Kyle White MRN: [id number] DOB: July 03, 1960     LOS: 29   SUBJECTIVE FINDINGS & SIGNIFICANT EVENTS   Patient description:  58 yo w/hx of morbid obesity, HFpEF hx MVR, CHF, PAF, OSA, DM, AHRF came in with acute hypoxemic hypercarbic resp failure with CXR showing b/l pl effusions and pulm edema.  CT with restrictive physiology,  pulm edema, bilateral pleural effusions and compressive atelectasis.  Events: 08/28 admitted to ICU/SDU for BiPAP. PCCM consultation 08/28 emergently intubated 08/29 Echocardiogram: EF 55-60%. LV cavity mildly dilated. Diastolic function could not be evaluated. Left atrium mildly dilated. RV not assessed. RA size not well visualized. 08/29 CTAP: No acute intra-abdominal process noted. No perforation. Trace ascites. Severe hepatic steatosis. Cholelithiasis. Small R and trace L pleural effusions. Bilateral lower lobe airspace disease may represent atelectasis but pneumonia is difficult to exclude, particularly on the R. 08/30 Extubated 08/30 Cardiology consultation: recommended diltiazem gtt for rapid AF and continue anticoagulation 08/31 reintubated emergently 08/31 bronchoscopy for mucous plugging and complete collapse of R lung 08/31 Palliative Care consultation to address goals of care 09/03 DNR established 09/05 Nephrology consultation for AKI 09/06 Renal BW:IOMBTD appearance of the kidneys 09/06 requiring 100% FiO2 and PEEP 15 cm H2O 09/07 ETT cuff leak, ETT changed 09/08 CTH: No acute intracranial abnormality seen 09/10 HD cath placed and intermittent HD initiated 09/11 + F/C. Improving vent requirements. 2 hrs of HD performed 09/12 improving vent requirements. Low-dose clonazepam and Duragesic patch initiated. Goals of care  discussion with patient's sister, consent for tracheostomy tube. 09/13 Severe desaturation with increased purulent respiratory secretions and volume loss due to mucus plugging.Respiratory culture obtained. Antibiotics expanded to piperacillin-tazobactam. DNR confirmed with patient's sister. Continuous sedation infusions initiated 9/14 ETT dislodged, s/p bronch to adjust ETT, extubated and re-intubated Patient has tracheomegaly, plan for trach 9/15 remains on high fio2 9/16 s/p trach 9/17 did not tolerate dialysis due to hemodynamic instability.  Persistent shock physiology on pressors 9/18 re-spiked temperatures, culture sputum, Merrem restarted 9/21 - had family meeting. Currently sister of patient [name] making med decisions.  She understands overall poor prognosis and current critically ill state.  9/22-family met with PALS team-currently patient is DNR/DNI with plan to transition to comfort care measures this week  Lines / Drains: Midline   Cultures / Sepsis markers: Blood and resp cx  Antibiotics: none   Protocols / Consultants: Cardiology   Tests / Events: TTE   PAST MEDICAL HISTORY   Past Medical History:  Diagnosis Date  . Atrial fibrillation ([place])   . CHF (congestive heart failure) ([place])   . Diabetes mellitus without complication ([place])   . Gout   . Hypertension associated with type 2 diabetes mellitus ([place])   . Morbid obesity ([place])   . Obstructive sleep apnea      SURGICAL HISTORY   Past Surgical History:  Procedure Laterality Date  . MITRAL VALVE REPAIR    . TRACHEOSTOMY TUBE PLACEMENT N/A 11/25/2018   Procedure: TRACHEOSTOMY;  Surgeon: Margaretha Sheffield, MD;  Location: [facility] ORS;  Service: ENT;  Laterality: N/A;     FAMILY HISTORY   History reviewed. No pertinent family history.   SOCIAL HISTORY   Social History   Tobacco Use  . Smoking status: Never Smoker  . Smokeless tobacco: Never Used  Substance Use Topics  . Alcohol use: Never     Frequency: Never  . Drug use: Never     MEDICATIONS   Current Medication:  Current Facility-Administered Medications:  .  0.9 %  sodium chloride infusion, , Intravenous, PRN, Margaretha Sheffield, MD, Stopped at 12/09/18 [phone] .  acetaminophen (TYLENOL) tablet 650 mg, 650 mg, Per NG tube, Q4H PRN, Margaretha Sheffield, MD, 650 mg at 2019-01-08 0053 .  amiodarone (NEXTERONE PREMIX) 360-4.14 MG/200ML-% (1.8 mg/mL) IV infusion, 60 mg/hr, Intravenous, Continuous, Margaretha Sheffield, MD, Last Rate: 33.3 mL/hr at 01-08-2019 0826, 60 mg/hr at Jan 08, 2019 0826 .  anticoagulant sodium citrate solution 5 mL, 5 mL, Intravenous, PRN, Darel Hong D, NP, 5 mL at 12/06/18 0115 .  bisacodyl (DULCOLAX) suppository 10 mg, 10 mg, Rectal, Daily PRN, Margaretha Sheffield, MD .  chlorhexidine gluconate (MEDLINE KIT) (PERIDEX) 0.12 % solution 15 mL, 15 mL, Mouth Rinse, BID, Margaretha Sheffield, MD, 15 mL at Jan 08, 2019 4431 .  Chlorhexidine Gluconate Cloth 2 % PADS 6 each, 6 each, Topical, Daily, Margaretha Sheffield, MD, 6 each at Jan 08, 2019 0222 .  ciprofloxacin (CIPRO) IVPB 400 mg, 400 mg, Intravenous, Q12H, Ottie Glazier, MD, Stopped at 12/11/18 2355 .  clonazePAM (KLONOPIN) tablet 0.5 mg, 0.5 mg, Oral, BID, Fritzi Mandes, MD, 0.5 mg at 2019/01/08 0824 .  dextrose 5 % with KCl 20 mEq / L  infusion, 20 mEq, Intravenous, Continuous, Lateef, Munsoor, MD, Last Rate: 75 mL/hr at 08-Jan-2019 0600 .  feeding supplement (PRO-STAT SUGAR FREE 64) liquid 60 mL, 60 mL, Per Tube, TID, Margaretha Sheffield, MD, 60 mL at 2019/01/08 0823 .  feeding supplement (VITAL HIGH PROTEIN) liquid 1,000 mL, 1,000 mL, Per Tube, Continuous, Margaretha Sheffield, MD, Last Rate: 45 mL/hr at 12/11/18 1701, 1,000 mL at 12/11/18 1701 .  fentaNYL (SUBLIMAZE) bolus via infusion 50 mcg, 50 mcg, Intravenous, Q15 min PRN, Margaretha Sheffield, MD .  fentaNYL 2562mg in NS 2561m(1048mml) infusion-PREMIX, 0-400 mcg/hr, Intravenous, Continuous, JueMargaretha SheffieldD, Last Rate: 15 mL/hr at 11/2018-12-1998, 150 mcg/hr at 09/October 20, 2020600 .  free water 200 mL, 200 mL, Per Tube, Q4H, Tukov-Yual, Magdalene S, NP, 200 mL at 11/2018-12-2024 .  insulin aspart (novoLOG) injection 0-20 Units, 0-20 Units, Subcutaneous, Q4H, JueMargaretha SheffieldD, 4 Units at 11/2018-12-2023 .  insulin aspart (novoLOG) injection 4 Units, 4 Units, Subcutaneous, Q4H, ChaBenita GutterPH, 4 Units at 09/10-20-202025400 insulin glargine (LANTUS) injection 40 Units, 40 Units, Subcutaneous, Daily, JueMargaretha SheffieldD, 40 Units at 11/29/18/[phone] ipratropium-albuterol (DUONEB) 0.5-2.5 (3) MG/3ML nebulizer solution 3 mL, 3 mL, Nebulization, Q6H, JueMargaretha SheffieldD, 3 mL at 09/10-20-2023 .  MEDLINE mouth rinse, 15 mL, Mouth Rinse, 10 times per day, JueMargaretha SheffieldD, 15 mL at 09/[phone] metoprolol tartrate (LOPRESSOR) injection 2.5-5 mg, 2.5-5 mg, Intravenous, Q6H PRN, JueMargaretha SheffieldD, 5 mg at 12/11/18 1525 .  midazolam (VERSED) injection 2 mg, 2 mg, Intravenous, Q2H PRN, JueMargaretha SheffieldD, 2 mg at 12/02/18 2029 .  multivitamin liquid 15 mL, 15 mL, Per Tube, Daily, JueMargaretha SheffieldD, 15 mL at 09/10-20-2025 .  pantoprazole sodium (PROTONIX) 40 mg/20 mL oral suspension 40 mg, 40 mg, Per Tube, QHS, JueMargaretha SheffieldD, 40 mg at 12/11/18 2229 .  QUEtiapine (SEROQUEL) tablet 75 mg, 75 mg, Per Tube, QHS, SimCharlett NosePH, 75 mg at 12/11/18 2229 .  sodium chloride flush (NS) 0.9 % injection 10-40 mL, 10-40 mL, Intracatheter, Q12H, JueMargaretha SheffieldD, 10 mL at 09/Oct 20, 202026 .  sodium chloride flush (NS) 0.9 % injection 10-40 mL, 10-40 mL, Intracatheter, PRN, JueMargaretha SheffieldD    ALLERGIES  Patient has no active allergies.    REVIEW OF SYSTEMS     Unable to obtain due to MV and sedation  PHYSICAL EXAMINATION   Vital Signs: Temp:  [98.2 F (36.8 C)-100.4 F (38 C)] 98.2 F (36.8 C) (09/23 0821) Pulse Rate:  [57-160] 129 (09/23 0821) Resp:  [18-24] 21 (09/23 0821) BP: (93-136)/(66-89) 99/75 (09/23 0800) SpO2:  [89 %-100 %] 94 % (09/23 0821) FiO2 (%):   [35 %] 35 % (09/23 0740) Weight:  [234 kg] 234 kg (09/23 0500)  GENERAL:GCS4T on MV RASS-1 HEAD: Normocephalic, atraumatic.  EYES: Pupils equal, round, reactive to light.  No scleral icterus.  MOUTH: Moist mucosal membrane. NECK: Supple. No thyromegaly. No nodules. No JVD.  PULMONARY bilateral crackles CARDIOVASCULAR: S1 and S2. Regular rate and rhythm. No murmurs, rubs, or gallops.  GASTROINTESTINAL: Soft, nontender, non-distended. No masses. Positive bowel sounds. No hepatosplenomegaly.  MUSCULOSKELETAL: No swelling, clubbing, or edema.  NEUROLOGIC: Mild distress due to acute illness SKIN:intact,warm,dry   PERTINENT DATA     Infusions: . sodium chloride Stopped (12/09/18 0343)  . amiodarone 60 mg/hr (Dec 20, 2018 0826)  . anticoagulant sodium citrate    . ciprofloxacin Stopped (12/11/18 2355)  . dextrose 5 % with KCl 20 mEq / L 75 mL/hr at December 20, 2018 0600  . feeding supplement (VITAL HIGH PROTEIN) 1,000 mL (12/11/18 1701)  . fentaNYL infusion INTRAVENOUS 150 mcg/hr (2018-12-20 0600)   Scheduled Medications: . chlorhexidine gluconate (MEDLINE KIT)  15 mL Mouth Rinse BID  . Chlorhexidine Gluconate Cloth  6 each Topical Daily  . clonazePAM  0.5 mg Oral BID  . feeding supplement (PRO-STAT SUGAR FREE 64)  60 mL Per Tube TID  . free water  200 mL Per Tube Q4H  . insulin aspart  0-20 Units Subcutaneous Q4H  . insulin aspart  4 Units Subcutaneous Q4H  . insulin glargine  40 Units Subcutaneous Daily  . ipratropium-albuterol  3 mL Nebulization Q6H  . mouth rinse  15 mL Mouth Rinse 10 times per day  . multivitamin  15 mL Per Tube Daily  . pantoprazole sodium  40 mg Per Tube QHS  . QUEtiapine  75 mg Per Tube QHS  . sodium chloride flush  10-40 mL Intracatheter Q12H   PRN Medications: sodium chloride, acetaminophen, anticoagulant sodium citrate, bisacodyl, fentaNYL, metoprolol tartrate, midazolam, sodium chloride flush Hemodynamic parameters:   Intake/Output: 09/22 0701 - 09/23 0700  In: 3757.5 [I.V.:2882.5; NG/GT:435; IV Piggyback:440] Out: 2560 [Urine:2510; Stool:50]  Ventilator  Settings: Vent Mode: PCV FiO2 (%):  [35 %] 35 % Set Rate:  [20 bmp] 20 bmp PEEP:  [6 cmH20] 6 cmH20   Other Labs:   LAB RESULTS:  Basic Metabolic Panel: Recent Labs  Lab 12/08/18 0615 12/09/18 0446 12/10/18 0503 12/11/18 0401 12/20/18 0422  NA 148* 149* 151* 149* 146*  K 3.5 3.0* 3.2* 3.6 4.1  CL 114* 114* 117* 117* 111  CO2 23 24 26 24 26   GLUCOSE 191* 181* 155* 199* 194*  BUN 136* 121* 115* 103* 97*  CREATININE 2.80* 2.49* 2.28* 1.99* 2.01*  CALCIUM 7.8* 8.2* 8.3* 8.0* 8.6*  MG 2.5*  --  2.4 2.3  --   PHOS 5.9* 4.8*  --  5.2*  --    Liver Function Tests: Recent Labs  Lab 12/07/18 1505 12/09/18 0446  AST 61*  --   ALT 84*  --   ALKPHOS 382*  --   BILITOT 0.9  --   PROT 5.0*  --   ALBUMIN 1.5* 1.7*  No results for input(s): LIPASE, AMYLASE in the last 168 hours. No results for input(s): AMMONIA in the last 168 hours. CBC: Recent Labs  Lab 12/06/18 0503 12/07/18 0505 12/08/18 0615 12/09/18 0446 12/11/18 0401  WBC 3.9* 4.1 3.2* 3.3* 3.0*  NEUTROABS  --  3.3 2.6  --   --   HGB 9.3* 9.0* 8.2* 8.8* 8.0*  HCT 29.9* 28.5* 26.2* 28.4* 26.9*  MCV 95.8 95.0 96.0 95.0 99.3  PLT 54* 51* 45* 51* 55*   Cardiac Enzymes: No results for input(s): CKTOTAL, CKMB, CKMBINDEX, TROPONINI in the last 168 hours. BNP: Invalid input(s): POCBNP CBG: Recent Labs  Lab 12/11/18 1916 12/11/18 2332 01-08-2019 0044 01-08-2019 0344 2019-01-08 0727  GLUCAP 166* 213* 180* 158* 173*     IMAGING RESULTS:  Imaging: No results found.    ASSESSMENT AND PLAN    -Multidisciplinary rounds held today  Acute Hypoxic Respiratory Failure - due to pneumonia with strep and staph species with moderate resistance per sensitivity panels with intercurrent acute decompensated CHF with OHS/OSA,COPD, atelctasis, pl effusion  -continue MV support  -continue Bronchodilator Therapy -GOC  meeting with sister yesteday , today again with PALS team   Acute decompensated diastolic CHF with EF >97% - hx of mitral valve repair - repeat TTE - patient has been positive on fluid now >30L.  Nephrology is managing fluid status.  We have attempted dialysis, patient did not tolerate due to recurrent clotting of catheter and hypotension.  -I&O strict via Foley -oxygen as needed -follow up cardiac enzymes as indicated ICU monitoring    Renal Failure-acute on chronic due to cardiorenal syndrome with diabetic nephropathy -with hypernatremia - appreciate nephrology - currently D5w and free water flushes via OGT.  [name] severely overloaded with fluid.  -follow chem 7 -follow UO -continue Foley Catheter-assess need daily    Electrolyte derrangements -pharmacy consultation for electrolytes -Na mgt by nehporolgy    ID -continue IV abx as prescibed -follow up cultures -added cipro this am due to worsening WBC count and ordering fibrinogent   GI/Nutrition GI PROPHYLAXIS as indicated DIET-->TF's as tolerated Constipation protocol as indicated  ENDO - ICU hypoglycemic\Hyperglycemia protocol -check FSBS per protocol   ELECTROLYTES -follow labs as needed -replace as needed -pharmacy consultation   DVT/GI PRX ordered -SCDs  TRANSFUSIONS AS NEEDED MONITOR FSBS ASSESS the need for LABS as needed   Critical care provider statement:    Critical care time (minutes):  35   Critical care time was exclusive of:  Separately billable procedures and treating other patients   Critical care was necessary to treat or prevent imminent or life-threatening deterioration of the following conditions:  acute hypoxemic respiratory failure, acute decompensated HFpEF, abd distension, dm, copd, osa, dm, multiple comorbid conditions   Critical care was time spent personally by me on the following activities:  Development of treatment plan with patient or surrogate, discussions with  consultants, evaluation of patient's response to treatment, examination of patient, obtaining history from patient or surrogate, ordering and performing treatments and interventions, ordering and review of laboratory studies and re-evaluation of patient's condition.  I assumed direction of critical care for this patient from another provider in my specialty: no    This document was prepared using Dragon voice recognition software and may include unintentional dictation errors.    Ottie Glazier, M.D.  Division of [facility]

## 2018-12-20 DEATH — deceased

## 2019-01-24 LAB — BLOOD GAS, ARTERIAL
Acid-base deficit: 0.9 mmol/L (ref 0.0–2.0)
Bicarbonate: 22 mmol/L (ref 20.0–28.0)
FIO2: 0.4
Mechanical Rate: 20
O2 Saturation: 94.3 %
PEEP: 6 cmH2O
Patient temperature: 37
Pressure control: 25 cmH2O
pCO2 arterial: 31 mmHg — ABNORMAL LOW (ref 32.0–48.0)
pH, Arterial: 7.46 — ABNORMAL HIGH (ref 7.350–7.450)
pO2, Arterial: 68 mmHg — ABNORMAL LOW (ref 83.0–108.0)

## 2019-10-09 IMAGING — DX DG CHEST 1V PORT
1 series · 1 of 1 positions shown · non-contrast
Comparison: Film from earlier in the same day.

CLINICAL DATA: Check endotracheal tube placement

EXAM:
PORTABLE CHEST 1 VIEW

[chest ap]
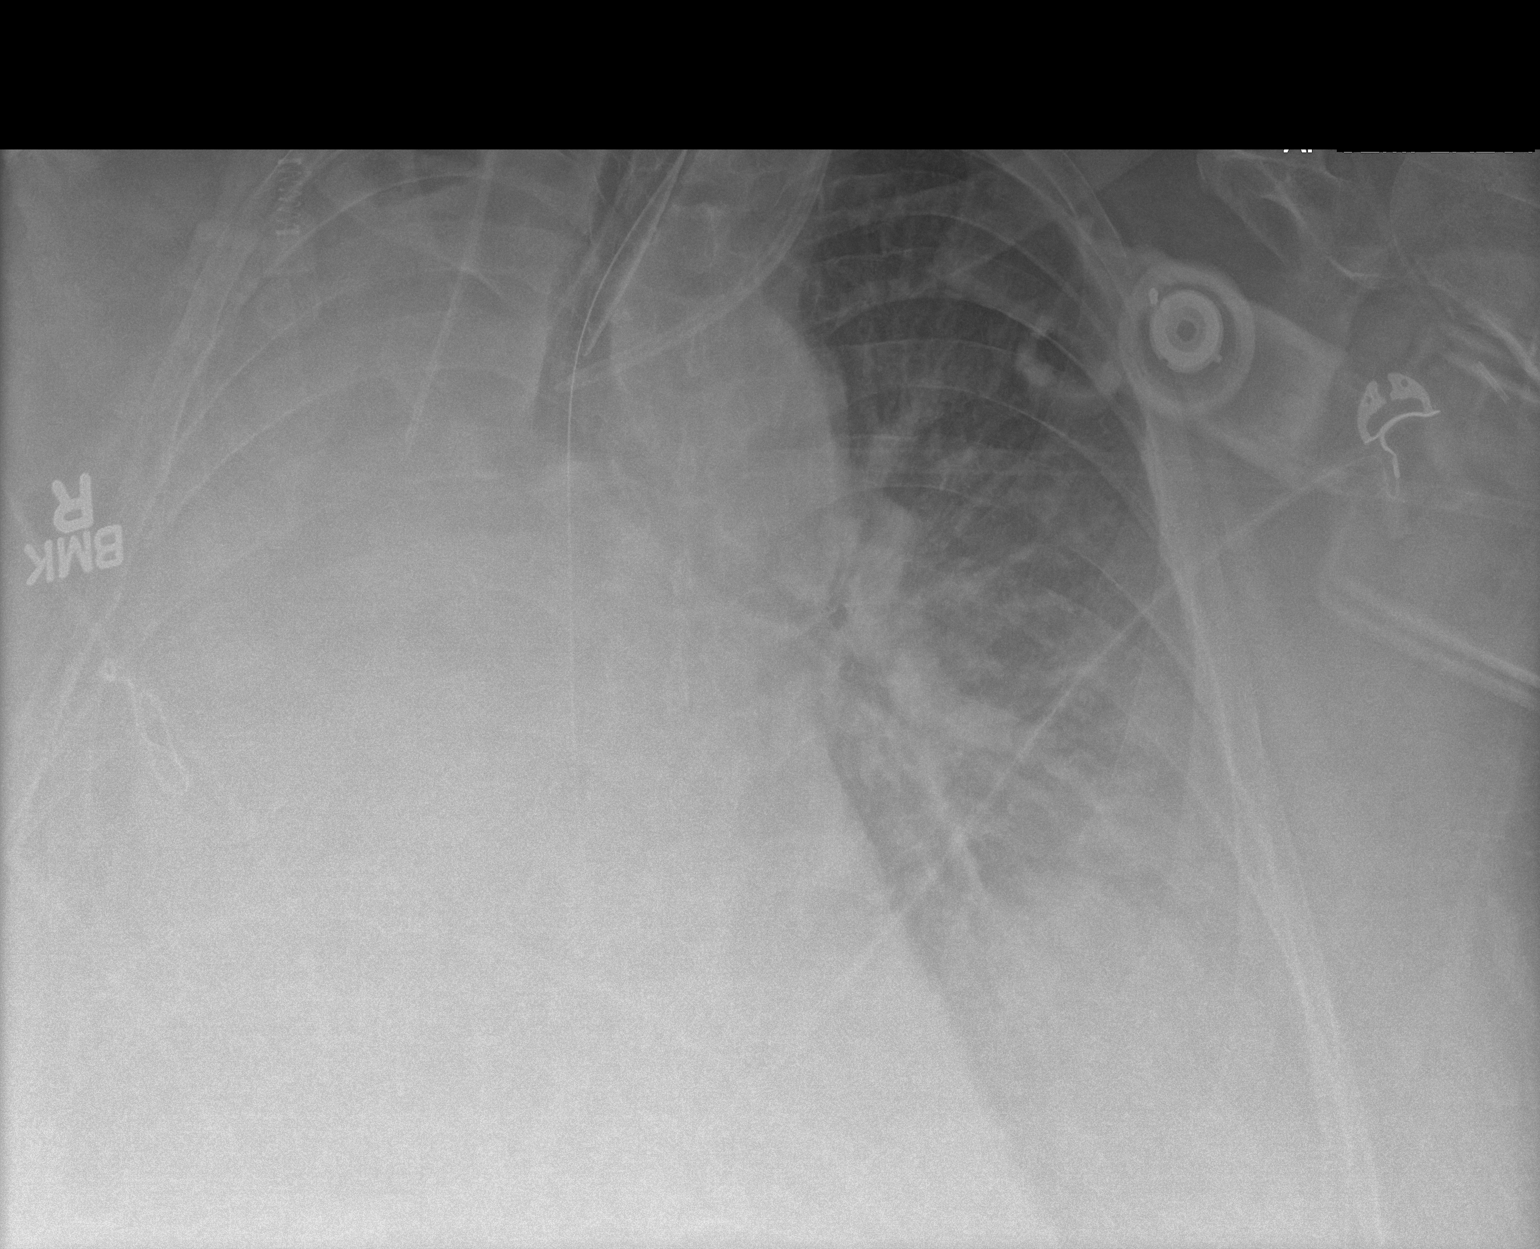

[1 of 1 positions shown; findings below may reference images not displayed]

FINDINGS: Endotracheal tube has been advanced and now lies approximately
cm above the carina. Gastric catheter is again seen although the tip
of the catheter is not appreciated on this exam. Bilateral jugular
central lines are again seen and stable. Increasing opacification in
the right hemithorax is noted likely related to a combination of
consolidation and effusion. The left lung is clear with the
exception of vascular congestion.
IMPRESSION: Tubes and lines as described.

Persistent vascular congestion.

Increasing right hemithorax opacification consistent with a
combination of consolidation and effusion.

## 2019-10-09 IMAGING — DX DG ABDOMEN 1V
2 series · 2 of 2 positions shown · non-contrast
Comparison: Film from earlier in the same day.

CLINICAL DATA: Check gastric catheter placement

EXAM:
ABDOMEN - 1 VIEW

[abdomen supine (1 of 2)]
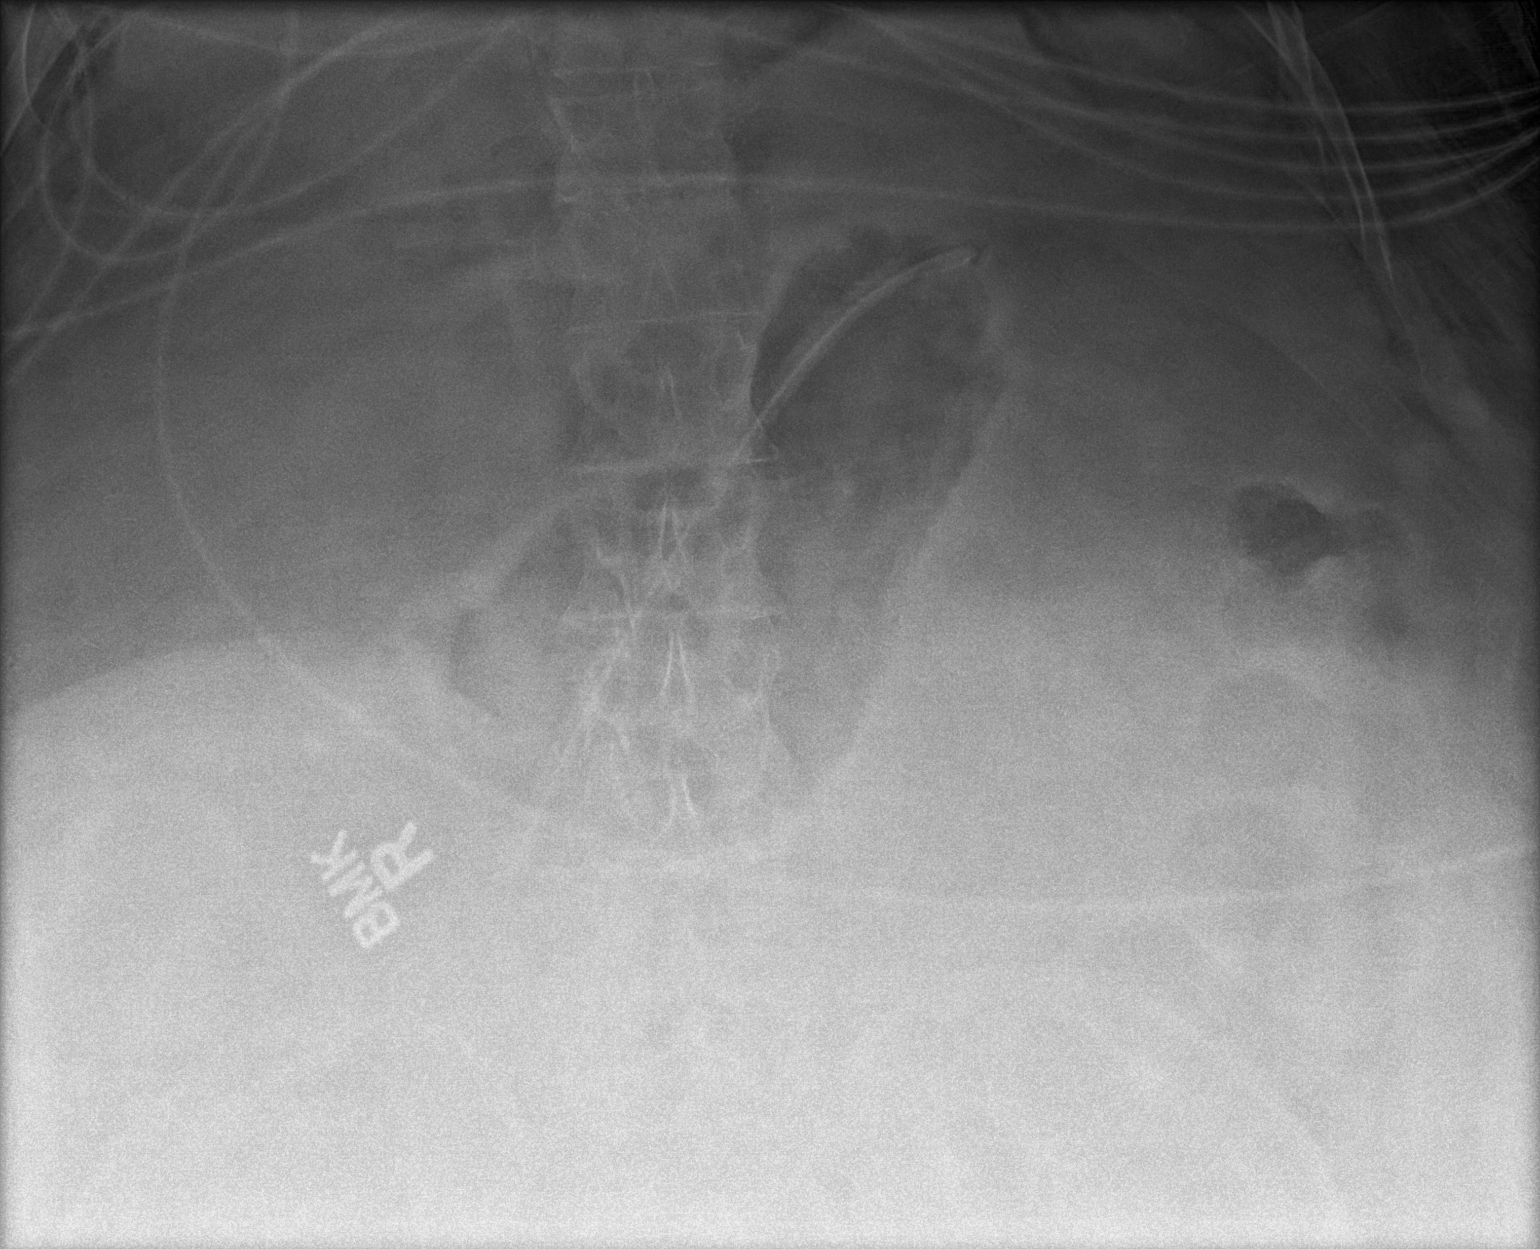

[abdomen supine (2 of 2)]
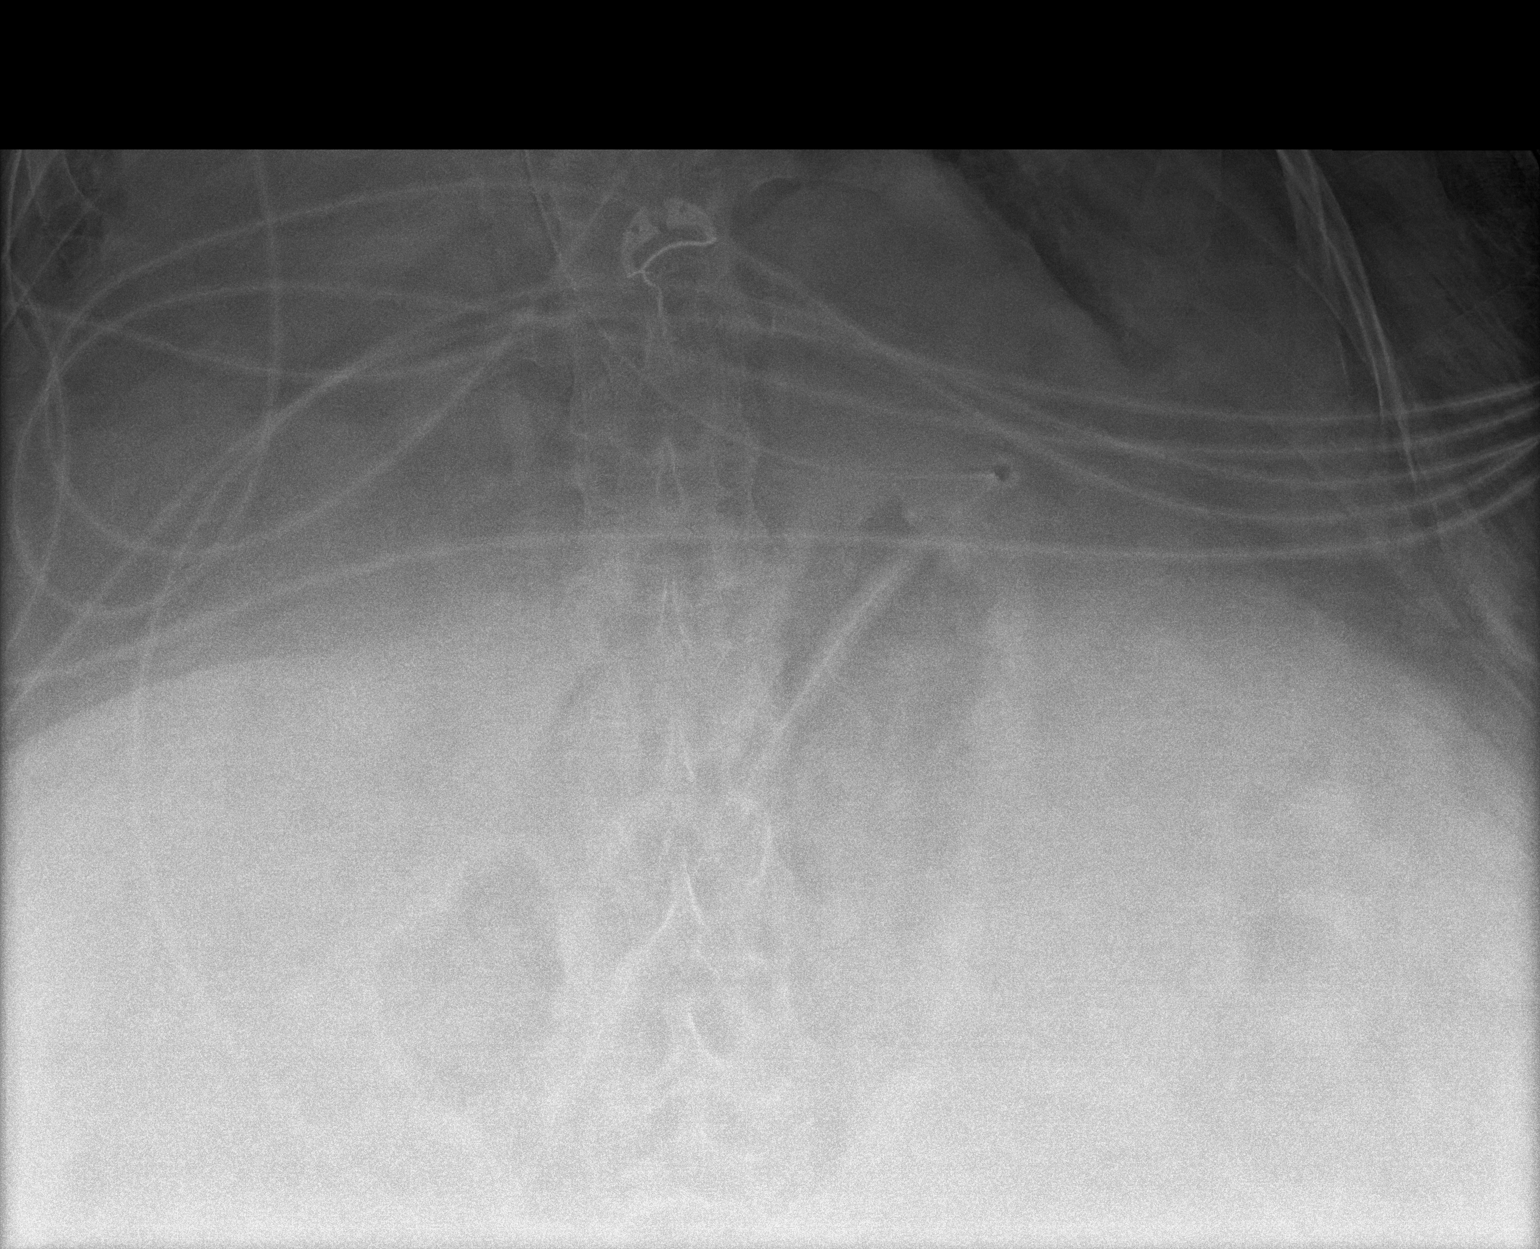

[2 of 2 positions shown; findings below may reference images not displayed]

FINDINGS: Gastric catheter has been advanced several cm and now lies with the
tip in the distal stomach.
IMPRESSION: Gastric catheter within the stomach.

## 2019-10-13 IMAGING — DX DG CHEST 1V PORT
1 series · 2 of 2 positions shown · non-contrast
Comparison: December 03, 2018

CLINICAL DATA: Respiratory failure

EXAM:
PORTABLE CHEST 1 VIEW

[Series 1: chest ap · 0.14mm/px · 2 of 2 slices shown]
[im 1/2]
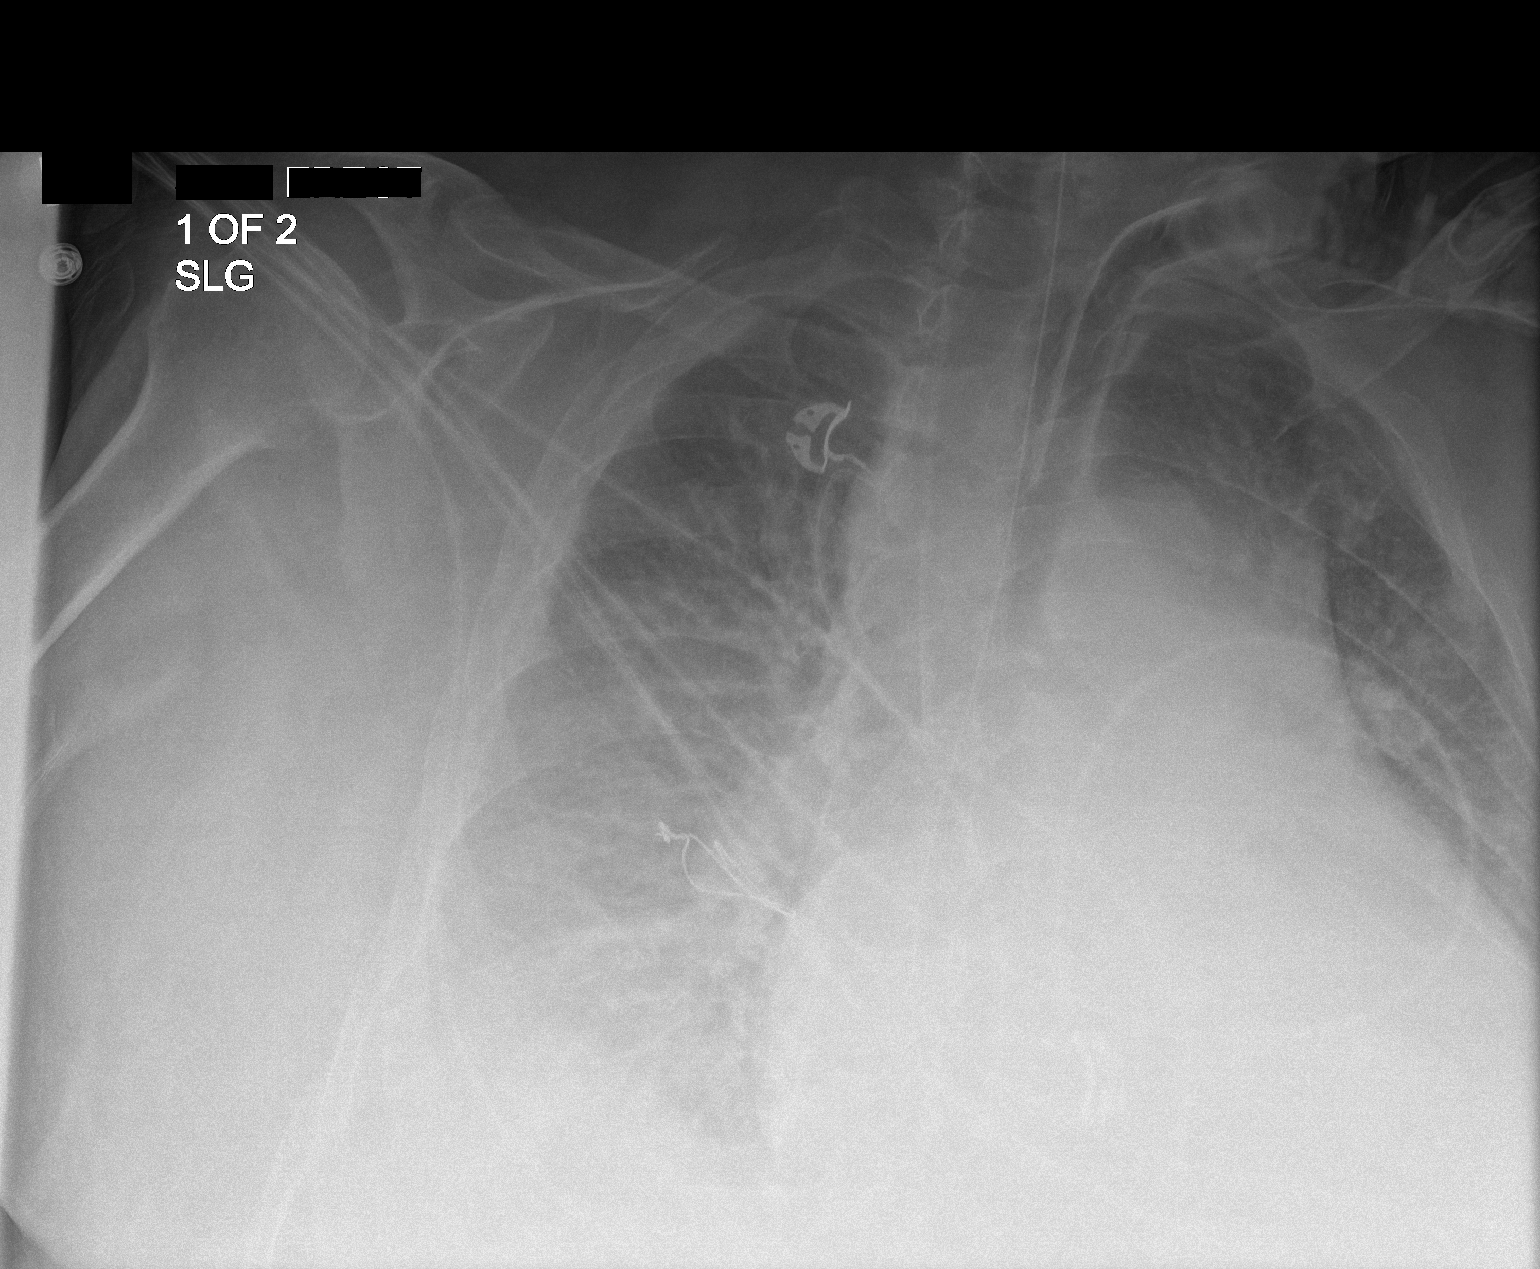
[im 2/2]
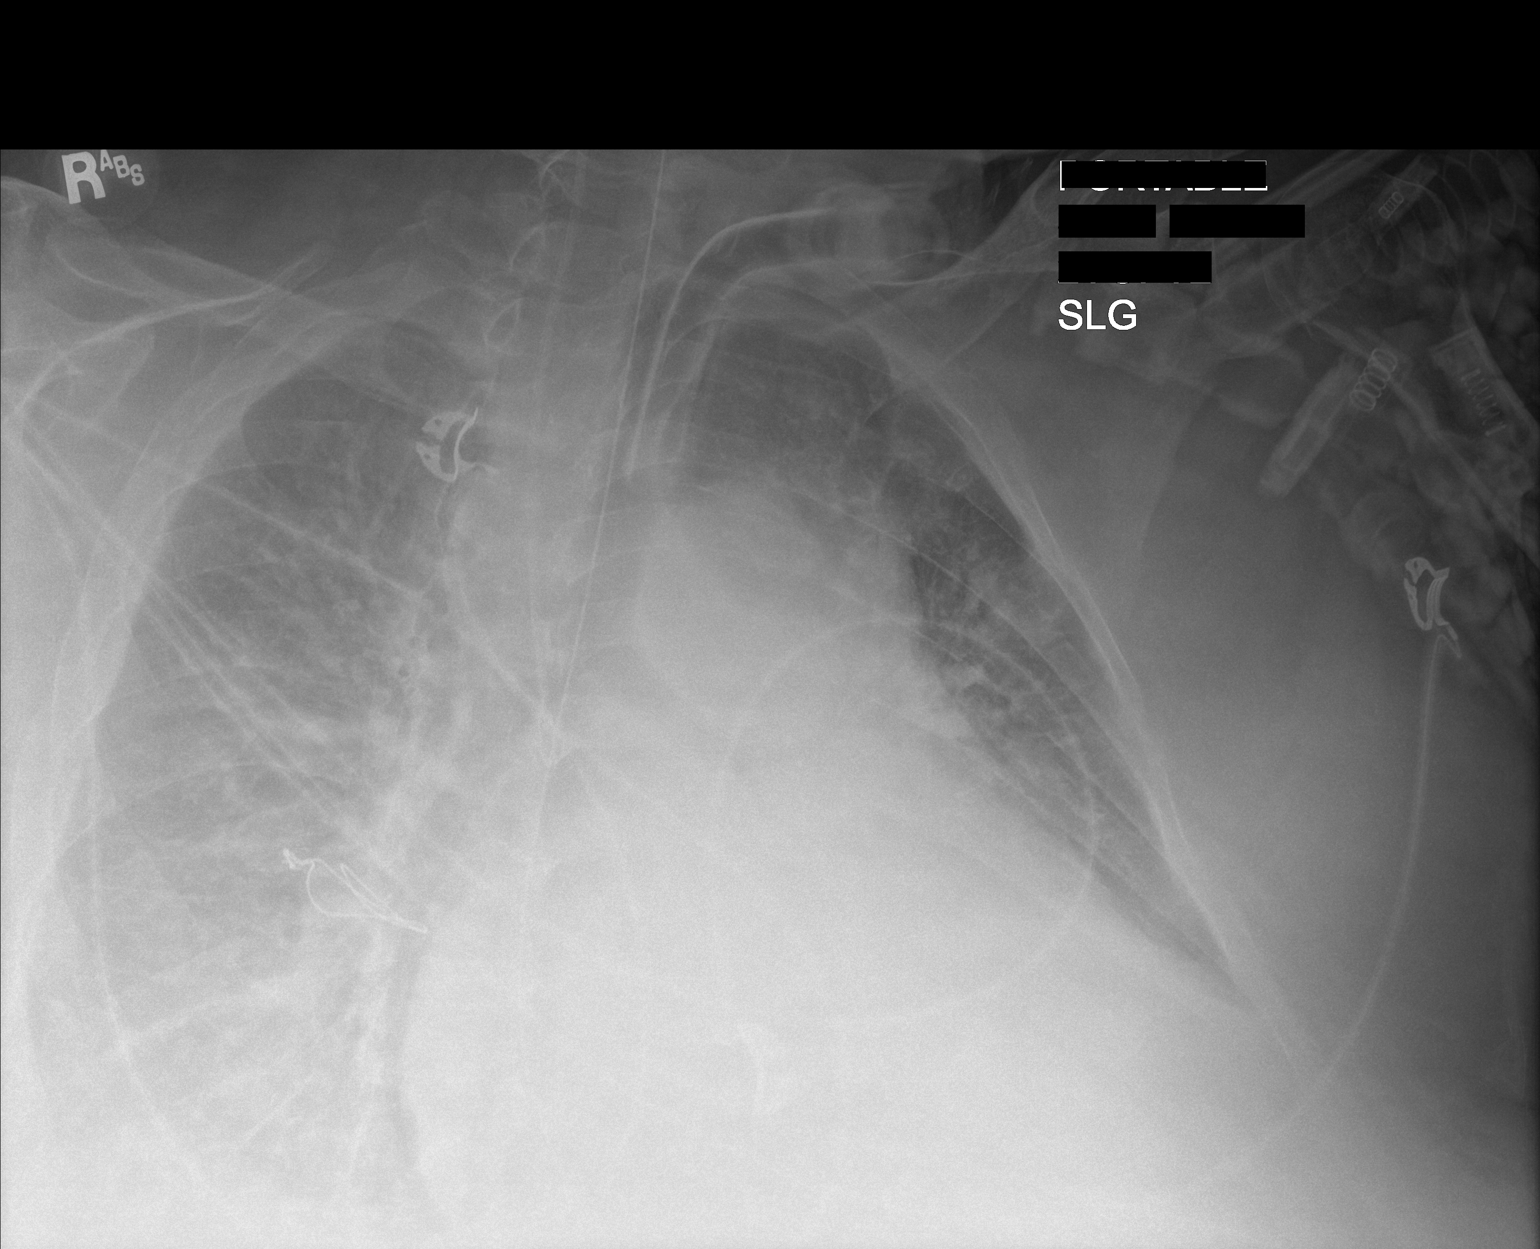

[2 of 2 positions shown; findings below may reference images not displayed]

FINDINGS: Limited due to patient position. Endotracheal tube tip is seen 4 cm
above the carina. NG tube is seen coursing below the diaphragm. A
right-sided central venous catheter seen at the superior cavoatrial
junction. Hazy ground-glass and interstitial opacities seen
throughout both lungs. There is interval improvement in the right
lung aeration. Again noted is cardiomegaly and a probable small
bilateral pleural effusions.
IMPRESSION: 1. Support lines and tubes in satisfactory position.
2. Interval improvement in the right lung aeration.
3. Diffuse hazy ground-glass and interstitial opacities , which
could be due to infection/atelectasis.
4. Small bilateral pleural effusions
